# Patient Record
Sex: Female | Born: 1945 | Race: White | Hispanic: No | Marital: Married | Smoking: Former smoker
Health system: Southern US, Community
[De-identification: ages and names within clinical notes are randomized; demographics above are authoritative.]

## PROBLEM LIST (undated history)

## (undated) DIAGNOSIS — I499 Cardiac arrhythmia, unspecified: Secondary | ICD-10-CM

## (undated) DIAGNOSIS — N184 Chronic kidney disease, stage 4 (severe): Secondary | ICD-10-CM

## (undated) DIAGNOSIS — Z803 Family history of malignant neoplasm of breast: Secondary | ICD-10-CM

## (undated) DIAGNOSIS — I10 Essential (primary) hypertension: Secondary | ICD-10-CM

## (undated) DIAGNOSIS — N179 Acute kidney failure, unspecified: Secondary | ICD-10-CM

## (undated) DIAGNOSIS — Z87442 Personal history of urinary calculi: Secondary | ICD-10-CM

## (undated) DIAGNOSIS — I480 Paroxysmal atrial fibrillation: Secondary | ICD-10-CM

## (undated) DIAGNOSIS — R0600 Dyspnea, unspecified: Secondary | ICD-10-CM

## (undated) DIAGNOSIS — C801 Malignant (primary) neoplasm, unspecified: Secondary | ICD-10-CM

## (undated) DIAGNOSIS — Z800 Family history of malignant neoplasm of digestive organs: Secondary | ICD-10-CM

## (undated) DIAGNOSIS — Z85828 Personal history of other malignant neoplasm of skin: Secondary | ICD-10-CM

## (undated) DIAGNOSIS — Z860100 Personal history of colon polyps, unspecified: Secondary | ICD-10-CM

## (undated) DIAGNOSIS — C50919 Malignant neoplasm of unspecified site of unspecified female breast: Secondary | ICD-10-CM

## (undated) DIAGNOSIS — I5033 Acute on chronic diastolic (congestive) heart failure: Secondary | ICD-10-CM

## (undated) DIAGNOSIS — Z808 Family history of malignant neoplasm of other organs or systems: Secondary | ICD-10-CM

## (undated) DIAGNOSIS — I5032 Chronic diastolic (congestive) heart failure: Secondary | ICD-10-CM

## (undated) DIAGNOSIS — M1990 Unspecified osteoarthritis, unspecified site: Secondary | ICD-10-CM

## (undated) DIAGNOSIS — R778 Other specified abnormalities of plasma proteins: Secondary | ICD-10-CM

## (undated) HISTORY — DX: Essential (primary) hypertension: I10

## (undated) HISTORY — DX: Personal history of colonic polyps: Z86.010

## (undated) HISTORY — DX: Chronic diastolic (congestive) heart failure: I50.32

## (undated) HISTORY — DX: Other specified abnormalities of plasma proteins: R77.8

## (undated) HISTORY — PX: COLONOSCOPY: SHX174

## (undated) HISTORY — DX: Family history of malignant neoplasm of digestive organs: Z80.0

## (undated) HISTORY — PX: FOOT SURGERY: SHX648

## (undated) HISTORY — DX: Personal history of colon polyps, unspecified: Z86.0100

## (undated) HISTORY — DX: Family history of malignant neoplasm of breast: Z80.3

## (undated) HISTORY — DX: Malignant neoplasm of unspecified site of unspecified female breast: C50.919

## (undated) HISTORY — DX: Paroxysmal atrial fibrillation: I48.0

## (undated) HISTORY — DX: Family history of malignant neoplasm of other organs or systems: Z80.8

## (undated) HISTORY — DX: Acute on chronic diastolic (congestive) heart failure: I50.33

## (undated) HISTORY — DX: Personal history of other malignant neoplasm of skin: Z85.828

## (undated) HISTORY — DX: Acute kidney failure, unspecified: N17.9

---

## 1998-08-30 HISTORY — PX: OTHER SURGICAL HISTORY: SHX169

## 1998-10-10 ENCOUNTER — Ambulatory Visit (HOSPITAL_BASED_OUTPATIENT_CLINIC_OR_DEPARTMENT_OTHER): Admission: RE | Admit: 1998-10-10 | Discharge: 1998-10-10 | Payer: Self-pay | Admitting: *Deleted

## 1998-10-21 ENCOUNTER — Ambulatory Visit (HOSPITAL_BASED_OUTPATIENT_CLINIC_OR_DEPARTMENT_OTHER): Admission: RE | Admit: 1998-10-21 | Discharge: 1998-10-21 | Payer: Self-pay | Admitting: *Deleted

## 2014-09-27 DIAGNOSIS — J3489 Other specified disorders of nose and nasal sinuses: Secondary | ICD-10-CM | POA: Diagnosis not present

## 2014-10-03 DIAGNOSIS — J209 Acute bronchitis, unspecified: Secondary | ICD-10-CM | POA: Diagnosis not present

## 2014-11-05 DIAGNOSIS — J441 Chronic obstructive pulmonary disease with (acute) exacerbation: Secondary | ICD-10-CM | POA: Diagnosis not present

## 2014-11-05 DIAGNOSIS — I10 Essential (primary) hypertension: Secondary | ICD-10-CM | POA: Diagnosis not present

## 2014-11-05 DIAGNOSIS — Z1389 Encounter for screening for other disorder: Secondary | ICD-10-CM | POA: Diagnosis not present

## 2014-11-05 DIAGNOSIS — E780 Pure hypercholesterolemia: Secondary | ICD-10-CM | POA: Diagnosis not present

## 2014-11-05 DIAGNOSIS — Z79899 Other long term (current) drug therapy: Secondary | ICD-10-CM | POA: Diagnosis not present

## 2014-11-22 DIAGNOSIS — M62838 Other muscle spasm: Secondary | ICD-10-CM | POA: Diagnosis not present

## 2014-12-26 DIAGNOSIS — H3532 Exudative age-related macular degeneration: Secondary | ICD-10-CM | POA: Diagnosis not present

## 2014-12-26 DIAGNOSIS — H2513 Age-related nuclear cataract, bilateral: Secondary | ICD-10-CM | POA: Diagnosis not present

## 2015-01-01 DIAGNOSIS — D126 Benign neoplasm of colon, unspecified: Secondary | ICD-10-CM | POA: Diagnosis not present

## 2015-01-23 ENCOUNTER — Other Ambulatory Visit: Payer: Self-pay

## 2015-01-23 DIAGNOSIS — Z800 Family history of malignant neoplasm of digestive organs: Secondary | ICD-10-CM | POA: Diagnosis not present

## 2015-01-23 DIAGNOSIS — Z87891 Personal history of nicotine dependence: Secondary | ICD-10-CM | POA: Diagnosis not present

## 2015-01-23 DIAGNOSIS — K5730 Diverticulosis of large intestine without perforation or abscess without bleeding: Secondary | ICD-10-CM | POA: Diagnosis not present

## 2015-01-23 DIAGNOSIS — D124 Benign neoplasm of descending colon: Secondary | ICD-10-CM | POA: Diagnosis not present

## 2015-01-23 DIAGNOSIS — K648 Other hemorrhoids: Secondary | ICD-10-CM | POA: Diagnosis not present

## 2015-01-23 DIAGNOSIS — I10 Essential (primary) hypertension: Secondary | ICD-10-CM | POA: Diagnosis not present

## 2015-01-23 DIAGNOSIS — D120 Benign neoplasm of cecum: Secondary | ICD-10-CM | POA: Diagnosis not present

## 2015-01-23 DIAGNOSIS — Z86010 Personal history of colonic polyps: Secondary | ICD-10-CM | POA: Diagnosis not present

## 2015-01-23 DIAGNOSIS — D122 Benign neoplasm of ascending colon: Secondary | ICD-10-CM | POA: Diagnosis not present

## 2015-01-23 DIAGNOSIS — D126 Benign neoplasm of colon, unspecified: Secondary | ICD-10-CM | POA: Diagnosis not present

## 2015-01-23 DIAGNOSIS — Z1211 Encounter for screening for malignant neoplasm of colon: Secondary | ICD-10-CM | POA: Diagnosis not present

## 2015-01-23 DIAGNOSIS — K621 Rectal polyp: Secondary | ICD-10-CM | POA: Diagnosis not present

## 2015-03-13 DIAGNOSIS — L300 Nummular dermatitis: Secondary | ICD-10-CM | POA: Diagnosis not present

## 2015-03-13 DIAGNOSIS — C44619 Basal cell carcinoma of skin of left upper limb, including shoulder: Secondary | ICD-10-CM | POA: Diagnosis not present

## 2015-03-13 DIAGNOSIS — C44519 Basal cell carcinoma of skin of other part of trunk: Secondary | ICD-10-CM | POA: Diagnosis not present

## 2015-03-28 DIAGNOSIS — Z79899 Other long term (current) drug therapy: Secondary | ICD-10-CM | POA: Diagnosis not present

## 2015-03-28 DIAGNOSIS — R739 Hyperglycemia, unspecified: Secondary | ICD-10-CM | POA: Diagnosis not present

## 2015-03-28 DIAGNOSIS — E780 Pure hypercholesterolemia: Secondary | ICD-10-CM | POA: Diagnosis not present

## 2015-03-28 DIAGNOSIS — Z23 Encounter for immunization: Secondary | ICD-10-CM | POA: Diagnosis not present

## 2015-03-28 DIAGNOSIS — I10 Essential (primary) hypertension: Secondary | ICD-10-CM | POA: Diagnosis not present

## 2015-04-01 DIAGNOSIS — C44519 Basal cell carcinoma of skin of other part of trunk: Secondary | ICD-10-CM | POA: Diagnosis not present

## 2015-04-24 DIAGNOSIS — D0462 Carcinoma in situ of skin of left upper limb, including shoulder: Secondary | ICD-10-CM | POA: Diagnosis not present

## 2015-05-09 DIAGNOSIS — M25473 Effusion, unspecified ankle: Secondary | ICD-10-CM | POA: Diagnosis not present

## 2015-05-09 DIAGNOSIS — Z1389 Encounter for screening for other disorder: Secondary | ICD-10-CM | POA: Diagnosis not present

## 2015-05-09 DIAGNOSIS — I10 Essential (primary) hypertension: Secondary | ICD-10-CM | POA: Diagnosis not present

## 2015-05-09 DIAGNOSIS — Z23 Encounter for immunization: Secondary | ICD-10-CM | POA: Diagnosis not present

## 2015-06-20 DIAGNOSIS — B301 Conjunctivitis due to adenovirus: Secondary | ICD-10-CM | POA: Diagnosis not present

## 2015-06-26 DIAGNOSIS — B301 Conjunctivitis due to adenovirus: Secondary | ICD-10-CM | POA: Diagnosis not present

## 2015-06-30 DIAGNOSIS — M7020 Olecranon bursitis, unspecified elbow: Secondary | ICD-10-CM | POA: Diagnosis not present

## 2015-07-01 DIAGNOSIS — Z1231 Encounter for screening mammogram for malignant neoplasm of breast: Secondary | ICD-10-CM | POA: Diagnosis not present

## 2015-07-04 DIAGNOSIS — L03119 Cellulitis of unspecified part of limb: Secondary | ICD-10-CM | POA: Diagnosis not present

## 2015-07-17 DIAGNOSIS — M25522 Pain in left elbow: Secondary | ICD-10-CM | POA: Diagnosis not present

## 2015-07-17 DIAGNOSIS — Z0000 Encounter for general adult medical examination without abnormal findings: Secondary | ICD-10-CM | POA: Diagnosis not present

## 2015-07-17 DIAGNOSIS — M25422 Effusion, left elbow: Secondary | ICD-10-CM | POA: Diagnosis not present

## 2015-07-21 DIAGNOSIS — M25571 Pain in right ankle and joints of right foot: Secondary | ICD-10-CM | POA: Diagnosis not present

## 2015-07-31 DIAGNOSIS — S82831A Other fracture of upper and lower end of right fibula, initial encounter for closed fracture: Secondary | ICD-10-CM | POA: Diagnosis not present

## 2015-08-30 DIAGNOSIS — R05 Cough: Secondary | ICD-10-CM | POA: Diagnosis not present

## 2015-08-30 DIAGNOSIS — J019 Acute sinusitis, unspecified: Secondary | ICD-10-CM | POA: Diagnosis not present

## 2015-09-04 DIAGNOSIS — S82831A Other fracture of upper and lower end of right fibula, initial encounter for closed fracture: Secondary | ICD-10-CM | POA: Diagnosis not present

## 2015-10-02 DIAGNOSIS — E785 Hyperlipidemia, unspecified: Secondary | ICD-10-CM | POA: Diagnosis not present

## 2015-10-02 DIAGNOSIS — Z79899 Other long term (current) drug therapy: Secondary | ICD-10-CM | POA: Diagnosis not present

## 2015-10-02 DIAGNOSIS — Z1389 Encounter for screening for other disorder: Secondary | ICD-10-CM | POA: Diagnosis not present

## 2015-10-02 DIAGNOSIS — I10 Essential (primary) hypertension: Secondary | ICD-10-CM | POA: Diagnosis not present

## 2015-10-02 DIAGNOSIS — E8881 Metabolic syndrome: Secondary | ICD-10-CM | POA: Diagnosis not present

## 2015-10-02 DIAGNOSIS — R739 Hyperglycemia, unspecified: Secondary | ICD-10-CM | POA: Diagnosis not present

## 2015-10-09 DIAGNOSIS — H5203 Hypermetropia, bilateral: Secondary | ICD-10-CM | POA: Diagnosis not present

## 2015-10-09 DIAGNOSIS — H353112 Nonexudative age-related macular degeneration, right eye, intermediate dry stage: Secondary | ICD-10-CM | POA: Diagnosis not present

## 2015-12-24 DIAGNOSIS — R002 Palpitations: Secondary | ICD-10-CM | POA: Diagnosis not present

## 2015-12-29 DIAGNOSIS — R079 Chest pain, unspecified: Secondary | ICD-10-CM | POA: Diagnosis not present

## 2015-12-29 DIAGNOSIS — I517 Cardiomegaly: Secondary | ICD-10-CM | POA: Diagnosis not present

## 2015-12-29 DIAGNOSIS — R002 Palpitations: Secondary | ICD-10-CM | POA: Diagnosis not present

## 2015-12-29 DIAGNOSIS — J449 Chronic obstructive pulmonary disease, unspecified: Secondary | ICD-10-CM | POA: Diagnosis not present

## 2016-02-01 DIAGNOSIS — R002 Palpitations: Secondary | ICD-10-CM | POA: Insufficient documentation

## 2016-02-01 HISTORY — DX: Palpitations: R00.2

## 2016-02-03 DIAGNOSIS — R002 Palpitations: Secondary | ICD-10-CM | POA: Diagnosis not present

## 2016-02-03 DIAGNOSIS — I10 Essential (primary) hypertension: Secondary | ICD-10-CM | POA: Diagnosis not present

## 2016-03-23 DIAGNOSIS — C44612 Basal cell carcinoma of skin of right upper limb, including shoulder: Secondary | ICD-10-CM | POA: Diagnosis not present

## 2016-04-12 DIAGNOSIS — H353123 Nonexudative age-related macular degeneration, left eye, advanced atrophic without subfoveal involvement: Secondary | ICD-10-CM | POA: Diagnosis not present

## 2016-04-13 DIAGNOSIS — C44612 Basal cell carcinoma of skin of right upper limb, including shoulder: Secondary | ICD-10-CM | POA: Diagnosis not present

## 2016-04-19 DIAGNOSIS — Z79899 Other long term (current) drug therapy: Secondary | ICD-10-CM | POA: Diagnosis not present

## 2016-04-19 DIAGNOSIS — E785 Hyperlipidemia, unspecified: Secondary | ICD-10-CM | POA: Diagnosis not present

## 2016-04-19 DIAGNOSIS — Z0000 Encounter for general adult medical examination without abnormal findings: Secondary | ICD-10-CM | POA: Diagnosis not present

## 2016-04-19 DIAGNOSIS — M25532 Pain in left wrist: Secondary | ICD-10-CM | POA: Diagnosis not present

## 2016-04-19 DIAGNOSIS — I10 Essential (primary) hypertension: Secondary | ICD-10-CM | POA: Diagnosis not present

## 2016-09-01 DIAGNOSIS — Z1231 Encounter for screening mammogram for malignant neoplasm of breast: Secondary | ICD-10-CM | POA: Diagnosis not present

## 2016-09-13 DIAGNOSIS — R921 Mammographic calcification found on diagnostic imaging of breast: Secondary | ICD-10-CM | POA: Diagnosis not present

## 2016-09-13 DIAGNOSIS — R928 Other abnormal and inconclusive findings on diagnostic imaging of breast: Secondary | ICD-10-CM | POA: Diagnosis not present

## 2016-09-21 DIAGNOSIS — D0462 Carcinoma in situ of skin of left upper limb, including shoulder: Secondary | ICD-10-CM | POA: Diagnosis not present

## 2016-10-04 DIAGNOSIS — I10 Essential (primary) hypertension: Secondary | ICD-10-CM | POA: Diagnosis not present

## 2016-10-04 DIAGNOSIS — R002 Palpitations: Secondary | ICD-10-CM | POA: Diagnosis not present

## 2016-10-05 DIAGNOSIS — D0462 Carcinoma in situ of skin of left upper limb, including shoulder: Secondary | ICD-10-CM | POA: Diagnosis not present

## 2016-10-11 DIAGNOSIS — R002 Palpitations: Secondary | ICD-10-CM | POA: Diagnosis not present

## 2016-10-13 DIAGNOSIS — R002 Palpitations: Secondary | ICD-10-CM | POA: Diagnosis not present

## 2016-10-30 DIAGNOSIS — I471 Supraventricular tachycardia: Secondary | ICD-10-CM | POA: Insufficient documentation

## 2016-10-30 DIAGNOSIS — I4719 Other supraventricular tachycardia: Secondary | ICD-10-CM | POA: Insufficient documentation

## 2016-10-30 HISTORY — DX: Other supraventricular tachycardia: I47.19

## 2016-10-30 HISTORY — DX: Supraventricular tachycardia: I47.1

## 2016-11-01 DIAGNOSIS — I10 Essential (primary) hypertension: Secondary | ICD-10-CM | POA: Diagnosis not present

## 2016-11-01 DIAGNOSIS — I471 Supraventricular tachycardia: Secondary | ICD-10-CM | POA: Diagnosis not present

## 2016-11-05 DIAGNOSIS — Z79899 Other long term (current) drug therapy: Secondary | ICD-10-CM | POA: Diagnosis not present

## 2016-11-05 DIAGNOSIS — E7800 Pure hypercholesterolemia, unspecified: Secondary | ICD-10-CM | POA: Diagnosis not present

## 2016-11-05 DIAGNOSIS — E785 Hyperlipidemia, unspecified: Secondary | ICD-10-CM | POA: Diagnosis not present

## 2016-11-05 DIAGNOSIS — Z1389 Encounter for screening for other disorder: Secondary | ICD-10-CM | POA: Diagnosis not present

## 2016-11-05 DIAGNOSIS — Z0000 Encounter for general adult medical examination without abnormal findings: Secondary | ICD-10-CM | POA: Diagnosis not present

## 2016-11-05 DIAGNOSIS — I10 Essential (primary) hypertension: Secondary | ICD-10-CM | POA: Diagnosis not present

## 2016-11-10 DIAGNOSIS — J0100 Acute maxillary sinusitis, unspecified: Secondary | ICD-10-CM | POA: Diagnosis not present

## 2016-11-16 DIAGNOSIS — J0100 Acute maxillary sinusitis, unspecified: Secondary | ICD-10-CM | POA: Diagnosis not present

## 2016-11-16 DIAGNOSIS — J209 Acute bronchitis, unspecified: Secondary | ICD-10-CM | POA: Diagnosis not present

## 2016-12-07 DIAGNOSIS — D0462 Carcinoma in situ of skin of left upper limb, including shoulder: Secondary | ICD-10-CM | POA: Diagnosis not present

## 2016-12-07 DIAGNOSIS — C44612 Basal cell carcinoma of skin of right upper limb, including shoulder: Secondary | ICD-10-CM | POA: Diagnosis not present

## 2017-01-18 DIAGNOSIS — H2513 Age-related nuclear cataract, bilateral: Secondary | ICD-10-CM | POA: Diagnosis not present

## 2017-01-18 DIAGNOSIS — H353123 Nonexudative age-related macular degeneration, left eye, advanced atrophic without subfoveal involvement: Secondary | ICD-10-CM | POA: Diagnosis not present

## 2017-01-18 DIAGNOSIS — H524 Presbyopia: Secondary | ICD-10-CM | POA: Diagnosis not present

## 2017-03-14 DIAGNOSIS — R921 Mammographic calcification found on diagnostic imaging of breast: Secondary | ICD-10-CM | POA: Diagnosis not present

## 2017-03-23 DIAGNOSIS — L821 Other seborrheic keratosis: Secondary | ICD-10-CM | POA: Diagnosis not present

## 2017-03-23 DIAGNOSIS — L578 Other skin changes due to chronic exposure to nonionizing radiation: Secondary | ICD-10-CM | POA: Diagnosis not present

## 2017-03-23 DIAGNOSIS — L820 Inflamed seborrheic keratosis: Secondary | ICD-10-CM | POA: Diagnosis not present

## 2017-03-23 DIAGNOSIS — C44529 Squamous cell carcinoma of skin of other part of trunk: Secondary | ICD-10-CM | POA: Diagnosis not present

## 2017-04-04 DIAGNOSIS — C44519 Basal cell carcinoma of skin of other part of trunk: Secondary | ICD-10-CM | POA: Diagnosis not present

## 2017-05-03 DIAGNOSIS — I10 Essential (primary) hypertension: Secondary | ICD-10-CM | POA: Diagnosis not present

## 2017-05-03 DIAGNOSIS — E785 Hyperlipidemia, unspecified: Secondary | ICD-10-CM | POA: Diagnosis not present

## 2017-05-03 DIAGNOSIS — Z79899 Other long term (current) drug therapy: Secondary | ICD-10-CM | POA: Diagnosis not present

## 2017-05-11 DIAGNOSIS — Z23 Encounter for immunization: Secondary | ICD-10-CM | POA: Diagnosis not present

## 2017-05-19 DIAGNOSIS — N644 Mastodynia: Secondary | ICD-10-CM | POA: Diagnosis not present

## 2017-05-19 DIAGNOSIS — I10 Essential (primary) hypertension: Secondary | ICD-10-CM | POA: Diagnosis not present

## 2017-06-08 DIAGNOSIS — R922 Inconclusive mammogram: Secondary | ICD-10-CM | POA: Diagnosis not present

## 2017-06-08 DIAGNOSIS — N6322 Unspecified lump in the left breast, upper inner quadrant: Secondary | ICD-10-CM | POA: Diagnosis not present

## 2017-06-10 DIAGNOSIS — R590 Localized enlarged lymph nodes: Secondary | ICD-10-CM | POA: Diagnosis not present

## 2017-06-10 DIAGNOSIS — R928 Other abnormal and inconclusive findings on diagnostic imaging of breast: Secondary | ICD-10-CM | POA: Diagnosis not present

## 2017-06-10 DIAGNOSIS — C773 Secondary and unspecified malignant neoplasm of axilla and upper limb lymph nodes: Secondary | ICD-10-CM | POA: Diagnosis not present

## 2017-06-10 DIAGNOSIS — C50212 Malignant neoplasm of upper-inner quadrant of left female breast: Secondary | ICD-10-CM | POA: Diagnosis not present

## 2017-06-10 DIAGNOSIS — N6322 Unspecified lump in the left breast, upper inner quadrant: Secondary | ICD-10-CM | POA: Diagnosis not present

## 2017-06-17 DIAGNOSIS — C773 Secondary and unspecified malignant neoplasm of axilla and upper limb lymph nodes: Secondary | ICD-10-CM | POA: Diagnosis not present

## 2017-06-17 DIAGNOSIS — C50912 Malignant neoplasm of unspecified site of left female breast: Secondary | ICD-10-CM | POA: Diagnosis not present

## 2017-06-21 ENCOUNTER — Ambulatory Visit (HOSPITAL_BASED_OUTPATIENT_CLINIC_OR_DEPARTMENT_OTHER): Payer: Medicare Other | Admitting: Hematology

## 2017-06-21 ENCOUNTER — Encounter: Payer: Self-pay | Admitting: Hematology

## 2017-06-21 ENCOUNTER — Telehealth: Payer: Self-pay | Admitting: Hematology

## 2017-06-21 ENCOUNTER — Ambulatory Visit (HOSPITAL_BASED_OUTPATIENT_CLINIC_OR_DEPARTMENT_OTHER): Payer: Medicare Other

## 2017-06-21 DIAGNOSIS — I10 Essential (primary) hypertension: Secondary | ICD-10-CM | POA: Diagnosis not present

## 2017-06-21 DIAGNOSIS — I4891 Unspecified atrial fibrillation: Secondary | ICD-10-CM | POA: Diagnosis not present

## 2017-06-21 DIAGNOSIS — C773 Secondary and unspecified malignant neoplasm of axilla and upper limb lymph nodes: Secondary | ICD-10-CM

## 2017-06-21 DIAGNOSIS — C50212 Malignant neoplasm of upper-inner quadrant of left female breast: Secondary | ICD-10-CM

## 2017-06-21 DIAGNOSIS — Z171 Estrogen receptor negative status [ER-]: Principal | ICD-10-CM

## 2017-06-21 DIAGNOSIS — Z803 Family history of malignant neoplasm of breast: Secondary | ICD-10-CM

## 2017-06-21 HISTORY — DX: Malignant neoplasm of upper-inner quadrant of left female breast: C50.212

## 2017-06-21 HISTORY — DX: Malignant neoplasm of upper-inner quadrant of left female breast: Z17.1

## 2017-06-21 LAB — COMPREHENSIVE METABOLIC PANEL
ALT: 21 U/L (ref 0–55)
AST: 23 U/L (ref 5–34)
Albumin: 4.2 g/dL (ref 3.5–5.0)
Alkaline Phosphatase: 51 U/L (ref 40–150)
Anion Gap: 11 mEq/L (ref 3–11)
BILIRUBIN TOTAL: 0.56 mg/dL (ref 0.20–1.20)
BUN: 14.4 mg/dL (ref 7.0–26.0)
CHLORIDE: 105 meq/L (ref 98–109)
CO2: 27 meq/L (ref 22–29)
Calcium: 9.6 mg/dL (ref 8.4–10.4)
Creatinine: 0.9 mg/dL (ref 0.6–1.1)
GLUCOSE: 109 mg/dL (ref 70–140)
Potassium: 3.9 mEq/L (ref 3.5–5.1)
SODIUM: 143 meq/L (ref 136–145)
TOTAL PROTEIN: 7.2 g/dL (ref 6.4–8.3)

## 2017-06-21 LAB — CBC WITH DIFFERENTIAL/PLATELET
BASO%: 0.9 % (ref 0.0–2.0)
Basophils Absolute: 0.1 10*3/uL (ref 0.0–0.1)
EOS%: 1.5 % (ref 0.0–7.0)
Eosinophils Absolute: 0.1 10*3/uL (ref 0.0–0.5)
HCT: 43 % (ref 34.8–46.6)
HGB: 14.1 g/dL (ref 11.6–15.9)
LYMPH%: 28.7 % (ref 14.0–49.7)
MCH: 28.6 pg (ref 25.1–34.0)
MCHC: 32.7 g/dL (ref 31.5–36.0)
MCV: 87.4 fL (ref 79.5–101.0)
MONO#: 1.1 10*3/uL — ABNORMAL HIGH (ref 0.1–0.9)
MONO%: 11.6 % (ref 0.0–14.0)
NEUT%: 57.3 % (ref 38.4–76.8)
NEUTROS ABS: 5.3 10*3/uL (ref 1.5–6.5)
Platelets: 217 10*3/uL (ref 145–400)
RBC: 4.93 10*6/uL (ref 3.70–5.45)
RDW: 14.2 % (ref 11.2–14.5)
WBC: 9.2 10*3/uL (ref 3.9–10.3)
lymph#: 2.6 10*3/uL (ref 0.9–3.3)

## 2017-06-21 MED ORDER — LORAZEPAM 1 MG PO TABS: 1.0000 mg | ORAL_TABLET | Freq: Once | ORAL | 0 refills | Status: DC | PRN

## 2017-06-21 NOTE — Telephone Encounter (Signed)
Scheduled appt per 10/23 los - Gave patient AVS and calender per los. - Central radiology to contact patient with ct and MRI schedule.

## 2017-06-21 NOTE — Progress Notes (Signed)
[facility]  [phone] Fax:([phone]) [place] Note   Patient Care Team: Ronita Hipps, MD as PCP - General (Family Medicine) 06/21/2017  CHIEF COMPLAINTS/PURPOSE OF CONSULTATION:  Newly diagnosed left breast cancer, triple negative, stage IIIB  ONCOLOGY HISTORY: Oncology History   Cancer Staging Malignant neoplasm of upper-inner quadrant of left breast in female, estrogen receptor negative ([place]) Staging form: Breast, AJCC 8th Edition - Clinical stage from 06/10/2017: Stage IIIB (cT2, cN1, cM0, G3, ER: Negative, PR: Negative, HER2: Negative) - Signed by Truitt Merle, MD on 06/21/2017       Malignant neoplasm of upper-inner quadrant of left breast in female, estrogen receptor negative ([place])   06/08/2017 Imaging    DIAGNOSTIC MAMMO and [place] Left Breast 06/08/17 IMPRESSION:  Highly suspicious palpable left breast 11 o'clock mass which measures 2.8 cm in greatest dimension. Ductal extension anteriorly to the level of the nipple, and posteriorly for additional 1.8cm, is suspicious for tumor extension.       06/10/2017 Initial Diagnosis    Malignant neoplasm of upper-inner quadrant of left breast in female, estrogen receptor negative ([place])      06/10/2017 Pathology Results    Diagnosis 06/10/17 1.) Breast, left, needle core biopsy -INVASIVE DUCTAL CARCINOMA, GRADE 3, WITH FOCAL HIGH GRADE DUCTAL CARCINOMA IN SITU -NEOPLASM INVOLVES ALL CORES AND MEASURES APPROXIMATELY 1.5CM IN MAXIMAL LINEAR DIMENSION  2.) Lymph node, needle/core biopsy, left axilla -METASTATIC CARCINOMA CONSISTENT WITH BREAST PRIMARY       06/10/2017 Receptors her2    ER: -, PR-, HER2 not amplified       HISTORY OF PRESENTING ILLNESS:  Lindsay [name] 71 y.o. female is here because of left breast cancer.   Initially, the patient palpated a lump within the retroareolar region of the left breast at the end of July. She also had noticed some left nipple inversion,  soreness, and hardness around the nipple for several months prior. In the past three months she reports that her soreness had improved originally, but since then she has not noticed any changes or worsening. She denies any abdominal pain, back pain, cough, chest pain, loss of appetite, weight loss, or any other associated symptoms in this time period.   She subsequently underwent mammogram and US of the left breast including the axilla which was notable for a suspicious mass within the 11 o'clock region of the left breast measuring at 2.8cm in the greatest dimension. Following this, she underwent stereotactic needle-core biopsy showing her to have invasive ductal carcinoma, histologic analysis demonstrating triple negative breast cancer, Ki67 20%. Additionally, an enlarged lymph node was surveyed within the left axilla which was positive for metastatic carcinoma.   On 06/17/17, she met with Dr [name] of [facility]. Given her triple negative cancer and the invasive nature of her disease, he recommended mastectomy with SLN dissection with port placement. She was subsequently referred into medical oncology for ongoing management of her disease to consider beginning surgery vs chemotherapy as the first round of treatment.   She reports today with her husband and sister. Overall she has been doing well and without notable acute complaints. Her breast soreness has resolved since initial onset. She has no other symptoms of concern to her. Of note, both of her sisters have also been diagnosed with breast cancer and she has an extensive family history of other cancers, including her mother who had pancreatic cancer.   GYN HISTORY  Menarchal: 71 years of age LMP: 50  years ago Contraceptive: Yes in her youth HRT: none GP: G2P2, she was 71yo at the age of first delivery. She did not breast feed.   She did previously smoke for approximately 50 years. She quit three years ago on August 4th, 2015. On  average, she smoked 2ppd. She does not drink any alcoholic beverages and does not partake in illicit drugs.   MEDICAL HISTORY:  Past Medical History:  Diagnosis Date  . Atrial fibrillation ([place]) 2017  . Hypertension     SURGICAL HISTORY: Past Surgical History:  Procedure Laterality Date  . FOOT SURGERY Right   . left breast lumpectomy  2000    SOCIAL HISTORY: Social History   Social History  . Marital status: Married    Spouse name: N/A  . Number of children: N/A  . Years of education: N/A   Occupational History  . Not on file.   Social History Main Topics  . Smoking status: Former Smoker    Packs/day: 2.00    Years: 50.00    Quit date: 06/02/2014  . Smokeless tobacco: Never Used  . Alcohol use No  . Drug use: No  . Sexual activity: Yes   Other Topics Concern  . Not on file   Social History Narrative  . No narrative on file    FAMILY HISTORY: Family History  Problem Relation Age of Onset  . Cancer Mother 40       pancreatic cancer   . Cancer Sister 79       breast cancer   . Cancer Paternal Aunt        breast cancer   . Cancer Sister 66       breast cancer   . Cancer Sister        malenoma   . Cancer Sister 20       colon cancer     ALLERGIES:  has No Known Allergies.  MEDICATIONS:  Current Outpatient Prescriptions  Medication Sig Dispense Refill  . acebutolol (SECTRAL) 200 MG capsule Take 200 mg by mouth 2 (two) times daily.    Marland Kitchen atorvastatin (LIPITOR) 10 MG tablet Take 10 mg by mouth at bedtime.    . calcium carbonate (OS-CAL) 600 MG TABS tablet Take 1,200 mg by mouth daily.    . Cholecalciferol (VITAMIN D3) 2000 units TABS Take 1 tablet by mouth daily.    . cloNIDine (CATAPRES) 0.1 MG tablet Take 0.1 mg by mouth 2 (two) times daily.    . DOCOSAHEXAENOIC ACID PO Take 1,200 mg by mouth 2 (two) times daily.    Marland Kitchen escitalopram (LEXAPRO) 5 MG tablet Take 5 mg by mouth daily.    . hydrALAZINE (APRESOLINE) 25 MG tablet Take 12.5 mg by mouth 3  (three) times daily.    Marland Kitchen LORazepam (ATIVAN) 1 MG tablet Take 1 tablet (1 mg total) by mouth once as needed for anxiety. Take 1 tab 1 hour before MRI scan 2 tablet 0  . magnesium oxide (MAG-OX) 400 MG tablet Take 400 mg by mouth daily.    Marland Kitchen olmesartan-hydrochlorothiazide (BENICAR HCT) 40-25 MG tablet Take 1 tablet by mouth daily.  1  . vitamin B-12 (CYANOCOBALAMIN) 1000 MCG tablet Take 1 tablet by mouth daily.     No current facility-administered medications for this visit.     REVIEW OF SYSTEMS:   Constitutional: Denies fevers, chills or abnormal night sweats Eyes: Denies blurriness of vision, double vision or watery eyes Ears, nose, mouth, throat, and face: Denies mucositis or sore  throat Respiratory: Denies cough, dyspnea or wheezes Cardiovascular: Denies palpitation, chest discomfort or lower extremity swelling Gastrointestinal:  Denies nausea, heartburn or change in bowel habits Skin: Denies abnormal skin rashes Lymphatics: Denies new lymphadenopathy or easy bruising Neurological:Denies numbness, tingling or new weaknesses Behavioral/Psych: Mood is stable, no new changes  All other systems were reviewed with the patient and are negative.  PHYSICAL EXAMINATION: ECOG PERFORMANCE STATUS: 0 - Asymptomatic  Vitals:   06/21/17 1456  BP: (!) 193/62  Pulse: 70  Resp: 20  Temp: 98.6 F (37 C)  SpO2: 96%   Filed Weights   06/21/17 1456  Weight: 271 lb 12.8 oz (123.3 kg)    GENERAL:alert, no distress and comfortable SKIN: skin color, texture, turgor are normal, no rashes or significant lesions EYES: normal, conjunctiva are pink and non-injected, sclera clear OROPHARYNX:no exudate, no erythema and lips, buccal mucosa, and tongue normal  NECK: supple, thyroid normal size, non-tender, without nodularity LYMPH:  no palpable lymphadenopathy in the cervical, axillary or inguinal LUNGS: clear to auscultation and percussion with normal breathing effort HEART: regular rate & rhythm and  no murmurs and no lower extremity edema ABDOMEN:abdomen soft, non-tender and normal bowel sounds Musculoskeletal:no cyanosis of digits and no clubbing  PSYCH: alert & oriented x 3 with fluent speech NEURO: no focal motor/sensory deficits BREAST: She has some redness and skin edema in the areolar area of the left breast with bilateral nipple inversion. Additionally, there is a 3x1.5cm palpable mass within the 11 o'clock to 1 o'clock region of the left breast. Otherwise both axilla and right breast are without palpable mass, nodes, or notable changes.    LABORATORY DATA:  I have reviewed the data as listed CBC Latest Ref Rng & Units 06/21/2017  WBC 3.9 - 10.3 10e3/uL 9.2  Hemoglobin 11.6 - 15.9 g/dL 14.1  Hematocrit 34.8 - 46.6 % 43.0  Platelets 145 - 400 10e3/uL 217   PATHOLOGY:  Diagnosis 06/10/17 1.) Breast, left, needle core biopsy -INVASIVE DUCTAL CARCINOMA, GRADE 3, 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 No results found.   [place] Left Breast 06/08/17 IMPRESSION:  Highly suspicious palpable left breast 11 o'clock mass which measures 2.8 cm in greatest dimension. Ductal extension anteriorly to the level of the nipple, and posteriorly for additional 1.8cm, is suspicious for tumor extension.   ASSESSMENT: Lindsay [name] is a wonderful 71 y.o. female who presents today to discuss the ongoing management of her left breast cancer.   1. Malignant neoplasm of upper-inner quadrant of left breast of female, invasive ductal carcinoma, c2T0M0, Stage IIIb, ER/PR: negative, HER2: negative,  Grade 3.  -We discussed her mammogram, ultrasound findings, and initial biopsy results with patient and her family members in great detail.  -She presented with a palpable left breast mass, measures 2.8 cm on ultrasound, with left axillary lymph node involvement (not palpable on exam). Breast tumor biopsy showed triple negative breast ductal carcinoma. -She was seen by surgeon Dr. [name] who recommended left mastectomy with ALND and port placement.  -We discussed her risk of cancer recurrence after complete surgical resection. Due to the relatively large size of her primary  tumor, positive lymph nodes, and aggressive nature of her triple negative breast cancer, she is at very high risk of recurrence. -Due to her positive family history of breast cancer, we recommended her to see genetics, to ruled out inheritable breast cancer syndrome.   -We discussed the benefit of chemotherapy to reduce her risk of recurrence by 40-50% -We discussed neoadjuvant versus adjuvant chemotherapy. The benefit of neoadjuvant chemotherapy to evaluate her response, sooner to start systemic therapy, and downstage her disease to make lumpectomy a possibility, etc. I strongly recommend her to to consider neoadjuvant chemotherapy. I will discuss with her surgeon Dr. [name] also. She would like to proceed with this before surgery.  -I discussed the neoadjuvant chemotherapy regimens, especially with AC-T and TC.  Give stage III, triple negative disease, I suggest neoadjuvant Adriamycin and Cytoxan (AC) every 2 weeks for 4 cycles+Taxol every 2 weeks for 4 cycles, then surgery then radiation. Although she is 50, she is fit and active, would be a candidate for intensive chemotherapy. Additional carboplatine can be consdered if she had has BRCA gene mutation.  -The goal of therapy is curative.   --Chemotherapy consent: Side effects including but does not not limited to, fatigue, nausea, vomiting, diarrhea, hair loss, neuropathy, fluid  retention, renal and kidney dysfunction, neutropenic fever, needed for blood transfusion, bleeding, heart failure, small risk of leukemia or MDS, were discussed with patient in great detail. She agreed to proceed. -I recommend a CT scan and bone scan first for staging.    -She will need a port placed, chemo class and ECHO before starting chemotherapy. We'll tentatively start her chemotherapy in 2 weeks with chemo class next Friday.   2. AFIB and HTN -She is currently followed by a cardiologist for ongoing management of this. Not currently on formal anticoagulation therapy.  -She was on 36m ASA daily, but she reports that she recently stopped this in anticipation for her surgery. I encouraged her to start taking this again until her surgeon tell her to stop taking this.  -will obtain echo before her chemo   3. Genetics -I strongly encouraged her today to have genetic testing due to her strong family history of cancer. If this is positive, I informed her that her children and grandchildren would need to be tested as well. She would like to proceed with this.   PLAN:  -CT CAP w/ contrast, echo, breast MRI (with premed ativan), chemo class within one week -Lab today -genetics referral  -lab, flush, f/u and first cycle AC on 11/2  Orders Placed This Encounter  Procedures  . CT Abdomen Pelvis W Contrast    Standing Status:   Future    Standing Expiration Date:   06/21/2018    Order Specific Question:   If indicated for the ordered procedure, I authorize the administration of contrast media per Radiology protocol    Answer:   Yes    Order Specific Question:   [facility] imaging [facility]?    [facility]   Order Specific Question:   Radiology Contrast Protocol - do NOT remove file path    Answer:   \\[email or web address].pdf  . CT Chest W Contrast    Standing Status:   Future    Standing Expiration Date:   06/21/2018    Order Specific Question:   If  indicated for the ordered procedure, I authorize the administration of contrast media per Radiology protocol    Answer:   Yes    Order Specific  Question:   Preferred imaging location?    Answer:   [facility]    Order Specific Question:   Radiology Contrast Protocol - do NOT remove file path    Answer:   \\[email or web address].pdf  . NM Bone Scan Whole Body    Standing Status:   Future    Standing Expiration Date:   06/21/2018    Order Specific Question:   If indicated for the ordered procedure, I authorize the administration of a radiopharmaceutical per Radiology protocol    Answer:   Yes    Order Specific Question:   Preferred imaging location?    Answer:   [facility]    Order Specific Question:   Radiology Contrast Protocol - do NOT remove file path    Answer:   \\[email or web address]\[email or web address]  . MR BREAST BILATERAL W WO CONTRAST    Evaluation before neoadjuvant chemo    Standing Status:   Future    Standing Expiration Date:   08/21/2018    Order Specific Question:   If indicated for the ordered procedure, I authorize the administration of contrast media per Radiology protocol    Answer:   Yes    Order Specific Question:   What is the patient's sedation requirement?    Answer:   No Sedation    Order Specific Question:   Does the patient have a pacemaker or implanted devices?    Answer:   No    Order Specific Question:   Radiology Contrast Protocol - do NOT remove file path    Answer:   \\[email or web address]\Radiant\[email or web address]    Order Specific Question:   Preferred imaging location?    Answer:   [facility] (table limit-350 lbs)  . CBC with Differential    Standing Status:   Standing    Number of Occurrences:   50    Standing Expiration Date:   06/21/2022  . Comprehensive metabolic panel    Standing Status:   Standing    Number of Occurrences:   50    Standing Expiration Date:   06/21/2022  . CA 27.29    Standing  Status:   Standing    Number of Occurrences:   50    Standing Expiration Date:   06/21/2022  . Ambulatory referral to Genetics    Referral Priority:   Routine    Referral Type:   Consultation    Referral Reason:   Specialty Services Required    Number of Visits Requested:   1  . ECHOCARDIOGRAM COMPLETE    Standing Status:   Future    Standing Expiration Date:   09/21/2018    Order Specific Question:   Where should this test be performed    Answer:   [place]    Order Specific Question:   Perflutren DEFINITY (image enhancing agent) should be administered unless hypersensitivity or allergy exist    Answer:   Administer Perflutren    Order Specific Question:   Expected Date:    Answer:   1 week   This document serves as a record of services personally performed by Truitt Merle, MD. It was created on her behalf by Reola Mosher, a trained medical scribe. The creation of this record is based on the scribe's personal observations and the provider's statements to them. This document has been checked and approved by the attending provider.    Truitt Merle, MD 06/21/2017

## 2017-06-21 NOTE — Progress Notes (Signed)
START ON PATHWAY REGIMEN - Breast   Doxorubicin + Cyclophosphamide (AC):   A cycle is every 21 days:     Doxorubicin      Cyclophosphamide   **Always confirm dose/schedule in your pharmacy ordering system**    Paclitaxel 80 mg/m2 Weekly:   Administer weekly:     Paclitaxel   **Always confirm dose/schedule in your pharmacy ordering system**    Patient Characteristics: Preoperative or Nonsurgical Candidate (Clinical Staging), Neoadjuvant Therapy followed by Surgery, Invasive Disease, Chemotherapy, HER2 Negative/Unknown/Equivocal, ER Negative/Unknown, Platinum Therapy Not Indicated Therapeutic Status: Preoperative or Nonsurgical Candidate (Clinical Staging) AJCC M Category: cM0 AJCC Grade: G3 Breast Surgical Plan: Neoadjuvant Therapy followed by Surgery ER Status: Negative (-) AJCC 8 Stage Grouping: IIIB HER2 Status: Negative (-) AJCC T Category: cT2 AJCC N Category: cN1 PR Status: Negative (-) Type of Therapy: Platinum Therapy Not Indicated Intent of Therapy: Curative Intent, Discussed with Patient

## 2017-06-22 ENCOUNTER — Encounter: Payer: Self-pay | Admitting: *Deleted

## 2017-06-22 ENCOUNTER — Other Ambulatory Visit: Payer: Self-pay | Admitting: Surgery

## 2017-06-22 ENCOUNTER — Other Ambulatory Visit: Payer: Medicare Other

## 2017-06-22 LAB — CANCER ANTIGEN 27.29: CAN 27.29: 39.3 U/mL — AB (ref 0.0–38.6)

## 2017-06-22 MED ORDER — ONDANSETRON HCL 8 MG PO TABS: 8.0000 mg | ORAL_TABLET | Freq: Three times a day (TID) | ORAL | 2 refills | Status: DC | PRN

## 2017-06-22 MED ORDER — PROCHLORPERAZINE MALEATE 10 MG PO TABS: 10.0000 mg | ORAL_TABLET | Freq: Four times a day (QID) | ORAL | 2 refills | Status: DC | PRN

## 2017-06-22 MED ORDER — LIDOCAINE-PRILOCAINE 2.5-2.5 % EX CREA: 1.0000 "application " | TOPICAL_CREAM | CUTANEOUS | 2 refills | Status: DC | PRN

## 2017-06-22 NOTE — Addendum Note (Signed)
Addended by: Truitt Merle on: 06/22/2017 05:29 PM   Modules accepted: Orders

## 2017-06-23 ENCOUNTER — Encounter: Payer: Self-pay | Admitting: *Deleted

## 2017-06-23 ENCOUNTER — Ambulatory Visit (HOSPITAL_COMMUNITY)
Admission: RE | Admit: 2017-06-23 | Discharge: 2017-06-23 | Disposition: A | Discharge status: Home / Self Care | Payer: Medicare Other | Source: Ambulatory Visit | Attending: Hematology | Admitting: Hematology

## 2017-06-23 DIAGNOSIS — I420 Dilated cardiomyopathy: Secondary | ICD-10-CM | POA: Diagnosis not present

## 2017-06-23 DIAGNOSIS — Z171 Estrogen receptor negative status [ER-]: Secondary | ICD-10-CM | POA: Diagnosis not present

## 2017-06-23 DIAGNOSIS — C50212 Malignant neoplasm of upper-inner quadrant of left female breast: Secondary | ICD-10-CM | POA: Diagnosis not present

## 2017-06-23 NOTE — Pre-Procedure Instructions (Signed)
FRED HAMMES  06/23/2017      [id number] - [place], [place] - [address] [facility] [place] [address] Phone: [phone] Fax: [phone]    Your procedure is scheduled on November 1  Report to [place] at 0700 A.M.  Call this number if you have problems the morning of surgery:  [phone]   Remember:  Do not eat food or drink liquids after midnight.  Continue all other medications as directed by your physician except follow these medication instructions before surgery   Take these medicines the morning of surgery with A SIP OF WATER  acebutolol (SECTRAL) cloNIDine (CATAPRES) escitalopram (LEXAPRO) hydrALAZINE (APRESOLINE)  loratadine (CLARITIN)   7 days prior to surgery STOP taking any Aspirin (unless otherwise instructed by your surgeon), Aleve, Naproxen, Ibuprofen, Motrin, Advil, Goody's, BC's, all herbal medications, fish oil, and all vitamins   Do not wear jewelry, make-up or nail polish.  Do not wear lotions, powders, or perfumes, or deoderant.  Do not shave 48 hours prior to surgery.  Men may shave face and neck.  Do not bring valuables to the hospital.  [facility] is not responsible for any belongings or valuables.  Contacts, dentures or bridgework may not be worn into surgery.  Leave your suitcase in the car.  After surgery it may be brought to your room.  For patients admitted to the hospital, discharge time will be determined by your treatment team.  Patients discharged the day of surgery will not be allowed to drive home.    Special instructions:   [facility]- Preparing For Surgery  Before surgery, you can play an important role. Because skin is not sterile, your skin needs to be as free of germs as possible. You can reduce the number of germs on your skin by washing with CHG (chlorahexidine gluconate) Soap before surgery.  CHG is an antiseptic cleaner which kills germs and bonds with the skin to  continue killing germs even after washing.  Please do not use if you have an allergy to CHG or antibacterial soaps. If your skin becomes reddened/irritated stop using the CHG.  Do not shave (including legs and underarms) for at least 48 hours prior to first CHG shower. It is OK to shave your face.  Please follow these instructions carefully.   1. Shower the NIGHT BEFORE SURGERY and the MORNING OF SURGERY with CHG.   2. If you chose to wash your hair, wash your hair first as usual with your normal shampoo.  3. After you shampoo, rinse your hair and body thoroughly to remove the shampoo.  4. Use CHG as you would any other liquid soap. You can apply CHG directly to the skin and wash gently with a scrungie or a clean washcloth.   5. Apply the CHG Soap to your body ONLY FROM THE NECK DOWN.  Do not use on open wounds or open sores. Avoid contact with your eyes, ears, mouth and genitals (private parts). Wash Face and genitals (private parts)  with your normal soap.  6. Wash thoroughly, paying special attention to the area where your surgery will be performed.  7. Thoroughly rinse your body with warm water from the neck down.  8. DO NOT shower/wash with your normal soap after using and rinsing off the CHG Soap.  9. Pat yourself dry with a CLEAN TOWEL.  10. Wear CLEAN PAJAMAS to bed the night before surgery, wear comfortable clothes the morning of  surgery  11. Place CLEAN SHEETS on your bed the night of your first shower and DO NOT SLEEP WITH PETS.    Day of Surgery: Do not apply any deodorants/lotions. Please wear clean clothes to the hospital/surgery center.      Please read over the following fact sheets that you were given.

## 2017-06-23 NOTE — Progress Notes (Signed)
  Echocardiogram 2D Echocardiogram has been performed.  Lindsay [name] L Androw 06/23/2017, 10:31 AM

## 2017-06-24 ENCOUNTER — Encounter (HOSPITAL_COMMUNITY)
Admission: RE | Admit: 2017-06-24 | Discharge: 2017-06-24 | Disposition: A | Discharge status: Home / Self Care | Payer: Medicare Other | Source: Ambulatory Visit | Attending: Surgery | Admitting: Surgery

## 2017-06-24 ENCOUNTER — Encounter (HOSPITAL_COMMUNITY): Payer: Self-pay | Admitting: *Deleted

## 2017-06-24 DIAGNOSIS — Z01812 Encounter for preprocedural laboratory examination: Secondary | ICD-10-CM | POA: Insufficient documentation

## 2017-06-24 DIAGNOSIS — R001 Bradycardia, unspecified: Secondary | ICD-10-CM | POA: Diagnosis not present

## 2017-06-24 DIAGNOSIS — I10 Essential (primary) hypertension: Secondary | ICD-10-CM | POA: Diagnosis not present

## 2017-06-24 DIAGNOSIS — Z01810 Encounter for preprocedural cardiovascular examination: Secondary | ICD-10-CM | POA: Insufficient documentation

## 2017-06-24 HISTORY — DX: Malignant (primary) neoplasm, unspecified: C80.1

## 2017-06-24 HISTORY — DX: Personal history of urinary calculi: Z87.442

## 2017-06-24 HISTORY — DX: Cardiac arrhythmia, unspecified: I49.9

## 2017-06-24 LAB — BASIC METABOLIC PANEL
ANION GAP: 7 (ref 5–15)
BUN: 12 mg/dL (ref 6–20)
CO2: 28 mmol/L (ref 22–32)
CREATININE: 0.87 mg/dL (ref 0.44–1.00)
Calcium: 9 mg/dL (ref 8.9–10.3)
Chloride: 104 mmol/L (ref 101–111)
GFR calc non Af Amer: >60 mL/min (ref >60)
Glucose, Bld: 103 mg/dL — ABNORMAL HIGH (ref 65–99)
POTASSIUM: 4.5 mmol/L (ref 3.5–5.1)
SODIUM: 139 mmol/L (ref 135–145)

## 2017-06-24 LAB — CBC
HEMATOCRIT: 41.6 % (ref 36.0–46.0)
HEMOGLOBIN: 13.6 g/dL (ref 12.0–15.0)
MCH: 28.9 pg (ref 26.0–34.0)
MCHC: 32.7 g/dL (ref 30.0–36.0)
MCV: 88.5 fL (ref 78.0–100.0)
Platelets: 232 10*3/uL (ref 150–400)
RBC: 4.7 MIL/uL (ref 3.87–5.11)
RDW: 14 % (ref 11.5–15.5)
WBC: 9.1 10*3/uL (ref 4.0–10.5)

## 2017-06-24 NOTE — Progress Notes (Addendum)
PCP is Dr. Kennith Maes Cardiologist is Dr Horald Pollen [facility]  Denies any chest pain, fever, or cough. Request EKG and stress test and any heart studies. Echo noted from 06-23-17 Denies ever having a card cath.  Instructed to drink boost by 0700 on the day of surgery. Voices understanding.

## 2017-06-27 ENCOUNTER — Encounter (HOSPITAL_COMMUNITY): Payer: Self-pay

## 2017-06-27 ENCOUNTER — Ambulatory Visit (HOSPITAL_COMMUNITY)
Admission: RE | Admit: 2017-06-27 | Discharge: 2017-06-27 | Disposition: A | Discharge status: Home / Self Care | Payer: Medicare Other | Source: Ambulatory Visit | Attending: Hematology | Admitting: Hematology

## 2017-06-27 DIAGNOSIS — Z171 Estrogen receptor negative status [ER-]: Principal | ICD-10-CM

## 2017-06-27 DIAGNOSIS — C50212 Malignant neoplasm of upper-inner quadrant of left female breast: Secondary | ICD-10-CM

## 2017-06-27 MED ORDER — GADOBENATE DIMEGLUMINE 529 MG/ML IV SOLN: 20.0000 mL | Freq: Once | INTRAVENOUS | Status: DC | PRN

## 2017-06-27 NOTE — Progress Notes (Addendum)
Anesthesia Chart Review:  Pt is a 71 year old female scheduled for insertion of port-a-cath on 06/30/2017 with Coralie Keens, MD  - PCP is Kennith Maes, Md - Cardiologist is Shirlee More, MD. Last office visit 11/01/16 (notes in care everywhere)   PMH includes:   HTN, PACs, breast cancer. Former smoker. BMI 43.   Medications include: acebutolol, ASA 81mg , lipitor, clonidine, hydralazine, olmesartan-hctz.   BP (!) 192/69   Pulse 70   Temp 36.6 C   Resp 20   Ht 5\' 7"  (1.702 m)   Wt 274 lb 1.6 oz (124.3 kg)   SpO2 96%   BMI 42.93 kg/m    - I spoke with pt by telephone.  BP was high at pre-admission testing.  She had taken all of her BP meds that day, but reports she was feeling very anxious and worried.  When she checks her BP at home, she get a reading ~ 155/70.   Preoperative labs reviewed.    EKG 06/24/17: Sinus bradycardia (56 bpm) with PACs  Echo 06/23/17:  - Left ventricle: The cavity size was normal. Wall thickness was increased in a pattern of moderate LVH. Systolic function was normal. The estimated ejection fraction was in the range of 60% to 65%. Wall motion was normal; there were no regional wall motion abnormalities. Left ventricular diastolic function parameters were normal. - Mitral valve: Calcified annulus. Mildly thickened leaflets . - Left atrium: The atrium was mildly dilated. - Atrial septum: There was increased thickness of the septum, consistent with lipomatous hypertrophy. No defect or patent foramen ovale was identified. - Impressions: Normal GLS - 18.6  Holter monitor 10/13/16 [facility] cardiology [place]):  By notes: - Holter monitor done recently was Abnormal  - Basic rhythm was normal sinus . - Average HR was 57 BPM. - Maximum HR was 111 BPM. - Minimum HR was 46 BPM. 1. Ventricular: rare PVCs, one 8-beat run of wide complex tachycardia. 2. Supraventricular: frequent PACs, 6% of all complexes, with occasion runs of SVT, Appear to be ectopic  automatic tachycardias, longest run 17 beats, irregular at 100/min. 3. Bradyarrhythmias and Pauses: No Symptoms reported: Palpitations x 1 - assoc with isolated PACs CONCLUSIONS: 1. Abnormal Holter monitor.  2. Arrhythmia as described: freq PACs and occasional runs non-sustained atrial tachycardia 3. Symptoms were reported  4. Symptoms were not correlated closely with arrhythmias  Nuclear stress test 04/16/14 ([place]  - myocardial perfusion imaging showed no evidence of ischemia. Overall LV systolic function normal without regional wall motion abnormalities. EF 64%.  If no changes, I anticipate pt can proceed with surgery as scheduled.   Willeen Cass, FNP-BC [facility]/Anesthesiology Phone: [phone] 06/27/2017 2:13 PM

## 2017-06-28 ENCOUNTER — Encounter (HOSPITAL_COMMUNITY)
Admission: RE | Admit: 2017-06-28 | Discharge: 2017-06-28 | Disposition: A | Discharge status: Home / Self Care | Payer: Medicare Other | Source: Ambulatory Visit | Attending: Hematology | Admitting: Hematology

## 2017-06-28 ENCOUNTER — Ambulatory Visit (HOSPITAL_COMMUNITY)
Admission: RE | Admit: 2017-06-28 | Discharge: 2017-06-28 | Disposition: A | Discharge status: Home / Self Care | Payer: Medicare Other | Source: Ambulatory Visit | Attending: Hematology | Admitting: Hematology

## 2017-06-28 ENCOUNTER — Encounter (HOSPITAL_COMMUNITY): Payer: Self-pay

## 2017-06-28 DIAGNOSIS — I700 Atherosclerosis of aorta: Secondary | ICD-10-CM | POA: Diagnosis not present

## 2017-06-28 DIAGNOSIS — C50912 Malignant neoplasm of unspecified site of left female breast: Secondary | ICD-10-CM | POA: Diagnosis not present

## 2017-06-28 DIAGNOSIS — Z171 Estrogen receptor negative status [ER-]: Secondary | ICD-10-CM | POA: Diagnosis not present

## 2017-06-28 DIAGNOSIS — I2510 Atherosclerotic heart disease of native coronary artery without angina pectoris: Secondary | ICD-10-CM | POA: Diagnosis not present

## 2017-06-28 DIAGNOSIS — M19011 Primary osteoarthritis, right shoulder: Secondary | ICD-10-CM | POA: Insufficient documentation

## 2017-06-28 DIAGNOSIS — M19012 Primary osteoarthritis, left shoulder: Secondary | ICD-10-CM | POA: Insufficient documentation

## 2017-06-28 DIAGNOSIS — K7689 Other specified diseases of liver: Secondary | ICD-10-CM | POA: Diagnosis not present

## 2017-06-28 DIAGNOSIS — R591 Generalized enlarged lymph nodes: Secondary | ICD-10-CM | POA: Diagnosis not present

## 2017-06-28 DIAGNOSIS — E041 Nontoxic single thyroid nodule: Secondary | ICD-10-CM | POA: Diagnosis not present

## 2017-06-28 DIAGNOSIS — K5730 Diverticulosis of large intestine without perforation or abscess without bleeding: Secondary | ICD-10-CM | POA: Diagnosis not present

## 2017-06-28 DIAGNOSIS — C50212 Malignant neoplasm of upper-inner quadrant of left female breast: Secondary | ICD-10-CM

## 2017-06-28 DIAGNOSIS — M19071 Primary osteoarthritis, right ankle and foot: Secondary | ICD-10-CM | POA: Insufficient documentation

## 2017-06-28 DIAGNOSIS — N281 Cyst of kidney, acquired: Secondary | ICD-10-CM | POA: Insufficient documentation

## 2017-06-28 DIAGNOSIS — M19072 Primary osteoarthritis, left ankle and foot: Secondary | ICD-10-CM | POA: Insufficient documentation

## 2017-06-28 DIAGNOSIS — J432 Centrilobular emphysema: Secondary | ICD-10-CM | POA: Diagnosis not present

## 2017-06-28 DIAGNOSIS — Q278 Other specified congenital malformations of peripheral vascular system: Secondary | ICD-10-CM | POA: Insufficient documentation

## 2017-06-28 DIAGNOSIS — C3490 Malignant neoplasm of unspecified part of unspecified bronchus or lung: Secondary | ICD-10-CM | POA: Diagnosis not present

## 2017-06-28 MED ORDER — IOPAMIDOL (ISOVUE-300) INJECTION 61%
INTRAVENOUS | Status: AC
  Administered 2017-06-28: 100 mL via INTRAVENOUS
  Filled 2017-06-28: qty 100

## 2017-06-28 MED ORDER — TECHNETIUM TC 99M MEDRONATE IV KIT
20.0000 | PACK | Freq: Once | INTRAVENOUS | Status: AC
  Administered 2017-06-28: 20 via INTRAVENOUS

## 2017-06-28 MED ORDER — IOPAMIDOL (ISOVUE-300) INJECTION 61%: 100.0000 mL | Freq: Once | INTRAVENOUS | Status: DC | PRN

## 2017-06-28 NOTE — Progress Notes (Signed)
Open In error. 

## 2017-06-29 MED ORDER — CEFAZOLIN SODIUM 10 G IJ SOLR
3.0000 g | INTRAMUSCULAR | Status: AC
  Administered 2017-06-30: 3 g via INTRAVENOUS
  Filled 2017-06-29: qty 3000
  Filled 2017-06-29: qty 3

## 2017-06-29 NOTE — H&P (Signed)
Lindsay [name] 06/17/2017 9:32 AM Location: [facility] Patient #: [id number] DOB: [birth year] Married / Language: English / Race: White Female   History of Present Illness (Doyne Micke A. [name] MD; 06/17/2017 10:06 AM) The patient is a 71 year old female who presents with breast cancer. This patient is referred by Dr. Kennith Maes for the recent diagnosis of a left breast cancer. She had been having hardness around the nipple and nipple retraction for several months. She ended up having a mammogram and ultrasound showing a spiculated mass underneath the areola of the left breast as well as enlarged lymph nodes. She had a stereotactic biopsy showing her to have invasive left breast cancer. The lymph nodes were positive for invasive cancer as well. 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 She was treated with tamoxifen for this. She reports that she did not have radiation. She is currently otherwise without complaints.   Past Surgical History Alean Rinne, [place]; 06/17/2017 9:32 AM) Breast Biopsy  Left. Colon Polyp Removal - Colonoscopy   Diagnostic Studies History Alean Rinne, [place]; 06/17/2017 9:32 AM) Colonoscopy  1-5 years ago Mammogram  within last year  Allergies Alean Rinne, RMA; 06/17/2017 9:33 AM) No Known Drug Allergies 06/17/2017 Allergies Reconciled   Medication History Alean Rinne, RMA; 06/17/2017 9:34 AM) Acebutolol HCl (200MG  Capsule, Oral) Active. Atorvastatin Calcium (10MG  Tablet, Oral) Active. Cefdinir (300MG  Capsule, Oral) Active. CloNIDine HCl (0.1MG  Tablet, Oral) Active. Escitalopram Oxalate (5MG  Tablet, Oral) Active. HydrALAZINE HCl (25MG  Tablet, Oral) Active. Olmesartan Medoxomil (40MG  Tablet, Oral) Active. Olmesartan Medoxomil-HCTZ (40-25MG  Tablet, Oral) Active. Valsartan (320MG  Tablet, Oral) Active. Medications Reconciled  Social History Alean Rinne, [place]; 06/17/2017 9:32  AM) Caffeine use  Carbonated beverages, Coffee. No alcohol use  No drug use  Tobacco use  Former smoker.  Family History Alean Rinne, [place]; 06/17/2017 9:32 AM) Arthritis  Sister. Breast Cancer  Sister. Colon Cancer  Sister. Diabetes Mellitus  Brother, Sister. Heart Disease  Brother, Sister. Hypertension  Brother, Sister. Malignant Neoplasm Of Pancreas  Mother. Melanoma  Sister.  Pregnancy / Birth History Alean Rinne, [place]; 06/17/2017 9:32 AM) Age at menarche  34 years. Age of menopause  80-55 Gravida  2 Maternal age  22-30 Para  2  Other Problems Alean Rinne, [place]; 06/17/2017 9:32 AM) Breast Cancer  High blood pressure  Hypercholesterolemia  Kidney Stone  Lump In Breast  Vascular Disease     Review of Systems Alean Rinne RMA; 06/17/2017 9:32 AM) General Not Present- Appetite Loss, Chills, Fatigue, Fever, Night Sweats, Weight Gain and Weight Loss. Skin Not Present- Change in Wart/Mole, Dryness, Hives, Jaundice, New Lesions, Non-Healing Wounds, Rash and Ulcer. HEENT Not Present- Earache, Hearing Loss, Hoarseness, Nose Bleed, Oral Ulcers, Ringing in the Ears, Seasonal Allergies, Sinus Pain, Sore Throat, Visual Disturbances, Wears glasses/contact lenses and Yellow Eyes. Respiratory Not Present- Bloody sputum, Chronic Cough, Difficulty Breathing, Snoring and Wheezing. Breast Present- Breast Mass and Skin Changes. Not Present- Breast Pain and Nipple Discharge. Cardiovascular Not Present- Chest Pain, Difficulty Breathing Lying Down, Leg Cramps, Palpitations, Rapid Heart Rate, Shortness of Breath and Swelling of Extremities. Gastrointestinal Not Present- Abdominal Pain, Bloating, Bloody Stool, Change in Bowel Habits, Chronic diarrhea, Constipation, Difficulty Swallowing, Excessive gas, Gets full quickly at meals, Hemorrhoids, Indigestion, Nausea, Rectal Pain and Vomiting. Female Genitourinary Not Present- Frequency, Nocturia, Painful Urination, Pelvic  Pain and Urgency. Musculoskeletal Not Present- Back Pain, Joint Pain, Joint Stiffness, Muscle Pain, Muscle Weakness and Swelling of Extremities. Neurological  Not Present- Decreased Memory, Fainting, Headaches, Numbness, Seizures, Tingling, Tremor, Trouble walking and Weakness. Psychiatric Not Present- Anxiety, Bipolar, Change in Sleep Pattern, Depression, Fearful and Frequent crying. Endocrine Not Present- Cold Intolerance, Excessive Hunger, Hair Changes, Heat Intolerance, Hot flashes and New Diabetes. Hematology Not Present- Blood Thinners, Easy Bruising, Excessive bleeding, Gland problems, HIV and Persistent Infections.  Vitals Alean Rinne RMA; 06/17/2017 9:33 AM) 06/17/2017 9:33 AM Weight: 273.2 lb Height: 67in Body Surface Area: 2.31 m Body Mass Index: 42.79 kg/m  Temp.: 98.27F  Pulse: 41 (Regular)  BP: 155/80 (Sitting, Left Arm, Standard)       Physical Exam (Kou Gucciardo A. [name] MD; 06/17/2017 10:07 AM) General Mental Status-Alert. General Appearance-Consistent with stated age. Hydration-Well hydrated. Voice-Normal.  Head and Neck Head-normocephalic, atraumatic with no lesions or palpable masses. Trachea-midline. Thyroid Gland Characteristics - normal size and consistency.  Eye Eyeball - Bilateral-Extraocular movements intact. Sclera/Conjunctiva - Bilateral-No scleral icterus.  Chest and Lung Exam Chest and lung exam reveals -quiet, even and easy respiratory effort with no use of accessory muscles and on auscultation, normal breath sounds, no adventitious sounds and normal vocal resonance. Inspection Chest Wall - Normal. Back - normal.  Breast Breast - Left-Non Tender, No Biopsy scars, no Dimpling, No Inflammation, No Lumpectomy scars, No Mastectomy scars, No Peau d' Orange. Note: There is inversion of the left nipple and a fullness underneath the areola. I cannot feel any enlarged lymph nodes in the left axilla Breast -  Right-Symmetric, Non Tender, No Biopsy scars, no Dimpling, No Inflammation, No Lumpectomy scars, No Mastectomy scars, No Peau d' Orange.  Cardiovascular Cardiovascular examination reveals -normal heart sounds, regular rate and rhythm with no murmurs and normal pedal pulses bilaterally.  Abdomen Inspection Inspection of the abdomen reveals - No Hernias. Skin - Scar - no surgical scars. Palpation/Percussion Palpation and Percussion of the abdomen reveal - Soft, Non Tender, No Rebound tenderness, No Rigidity (guarding) and No hepatosplenomegaly. Auscultation Auscultation of the abdomen reveals - Bowel sounds normal.  Neurologic - Did not examine.  Musculoskeletal - Did not examine.  Lymphatic Head & Neck  General Head & Neck Lymphatics: Bilateral - Description - Normal. Axillary  General Axillary Region: Bilateral - Description - Normal. Tenderness - Non Tender. Femoral & Inguinal  Generalized Femoral & Inguinal Lymphatics: Bilateral - Description - Normal. Tenderness - Non Tender.    Assessment & Plan (Sharni Negron A. [name] MD; 06/17/2017 10:08 AM) BREAST CANCER METASTASIZED TO AXILLARY LYMPH NODE, LEFT (C50.912) Impression: I discussed the diagnosis with the patient and her family in detail. This is a large central breast cancer that is probably involving the nipple and is metastatic to lymph nodes. I am going to refer her to the cancer center for recommendations. I suspect she will need a mastectomy with axillary dissection and a Port-A-Cath inserted. She would like to have surgery before any chemotherapy if this is clinically indicated. She is not interested in reconstruction. I'll discuss things further with her after she is again oncology and we will consider were to proceed from. She and her family are in agreement with the plans. She would like to be seen at the cancer center here in [place]:  She has seen the oncologist and neoadjuvant chemotherapy has been  recommended.  We will proceed with placement of a port-a-cath.  I discussed port insertion with her in detail.  I discussed the risks which include but are not limited to bleeding, infection, injury to surrounding structures, pneumothorax, the need for other procedures,  etc.  She agrees to proceed.

## 2017-06-30 ENCOUNTER — Encounter (HOSPITAL_COMMUNITY)
Admission: RE | Disposition: A | Discharge status: Home / Self Care | Payer: Self-pay | Source: Ambulatory Visit | Attending: Surgery

## 2017-06-30 ENCOUNTER — Ambulatory Visit (HOSPITAL_COMMUNITY): Payer: Medicare Other | Admitting: Emergency Medicine

## 2017-06-30 ENCOUNTER — Ambulatory Visit (HOSPITAL_COMMUNITY)
Admission: RE | Admit: 2017-06-30 | Discharge: 2017-06-30 | Disposition: A | Discharge status: Home / Self Care | Payer: Medicare Other | Source: Ambulatory Visit | Attending: Surgery | Admitting: Surgery

## 2017-06-30 ENCOUNTER — Encounter (HOSPITAL_COMMUNITY): Payer: Self-pay | Admitting: *Deleted

## 2017-06-30 ENCOUNTER — Ambulatory Visit (HOSPITAL_COMMUNITY): Payer: Medicare Other | Admitting: Certified Registered Nurse Anesthetist

## 2017-06-30 ENCOUNTER — Ambulatory Visit (HOSPITAL_COMMUNITY): Payer: Medicare Other

## 2017-06-30 DIAGNOSIS — Z87891 Personal history of nicotine dependence: Secondary | ICD-10-CM | POA: Diagnosis not present

## 2017-06-30 DIAGNOSIS — Z7982 Long term (current) use of aspirin: Secondary | ICD-10-CM | POA: Diagnosis not present

## 2017-06-30 DIAGNOSIS — Z4682 Encounter for fitting and adjustment of non-vascular catheter: Secondary | ICD-10-CM | POA: Diagnosis not present

## 2017-06-30 DIAGNOSIS — E7800 Pure hypercholesterolemia, unspecified: Secondary | ICD-10-CM | POA: Insufficient documentation

## 2017-06-30 DIAGNOSIS — I10 Essential (primary) hypertension: Secondary | ICD-10-CM | POA: Diagnosis not present

## 2017-06-30 DIAGNOSIS — Z8249 Family history of ischemic heart disease and other diseases of the circulatory system: Secondary | ICD-10-CM | POA: Insufficient documentation

## 2017-06-30 DIAGNOSIS — Z803 Family history of malignant neoplasm of breast: Secondary | ICD-10-CM | POA: Diagnosis not present

## 2017-06-30 DIAGNOSIS — Z95828 Presence of other vascular implants and grafts: Secondary | ICD-10-CM

## 2017-06-30 DIAGNOSIS — Z6841 Body Mass Index (BMI) 40.0 and over, adult: Secondary | ICD-10-CM | POA: Diagnosis not present

## 2017-06-30 DIAGNOSIS — C50912 Malignant neoplasm of unspecified site of left female breast: Secondary | ICD-10-CM | POA: Diagnosis not present

## 2017-06-30 DIAGNOSIS — C773 Secondary and unspecified malignant neoplasm of axilla and upper limb lymph nodes: Secondary | ICD-10-CM | POA: Diagnosis not present

## 2017-06-30 DIAGNOSIS — Z87442 Personal history of urinary calculi: Secondary | ICD-10-CM | POA: Insufficient documentation

## 2017-06-30 DIAGNOSIS — C50212 Malignant neoplasm of upper-inner quadrant of left female breast: Secondary | ICD-10-CM | POA: Diagnosis not present

## 2017-06-30 DIAGNOSIS — Z79899 Other long term (current) drug therapy: Secondary | ICD-10-CM | POA: Insufficient documentation

## 2017-06-30 DIAGNOSIS — Z452 Encounter for adjustment and management of vascular access device: Secondary | ICD-10-CM | POA: Diagnosis not present

## 2017-06-30 DIAGNOSIS — E669 Obesity, unspecified: Secondary | ICD-10-CM | POA: Diagnosis not present

## 2017-06-30 DIAGNOSIS — Z419 Encounter for procedure for purposes other than remedying health state, unspecified: Secondary | ICD-10-CM

## 2017-06-30 HISTORY — PX: PORTACATH PLACEMENT: SHX2246

## 2017-06-30 SURGERY — INSERTION, TUNNELED CENTRAL VENOUS DEVICE, WITH PORT
Anesthesia: General | Site: Chest | Laterality: Right

## 2017-06-30 MED ORDER — FENTANYL CITRATE (PF) 100 MCG/2ML IJ SOLN
INTRAMUSCULAR | Status: DC | PRN
  Administered 2017-06-30: 75 ug via INTRAVENOUS

## 2017-06-30 MED ORDER — ACETAMINOPHEN 500 MG PO TABS
ORAL_TABLET | ORAL | Status: AC
  Administered 2017-06-30: 1000 mg via ORAL
  Filled 2017-06-30: qty 2

## 2017-06-30 MED ORDER — CHLORHEXIDINE GLUCONATE CLOTH 2 % EX PADS: 6.0000 | MEDICATED_PAD | Freq: Once | CUTANEOUS | Status: DC

## 2017-06-30 MED ORDER — ONDANSETRON HCL 4 MG/2ML IJ SOLN
INTRAMUSCULAR | Status: DC | PRN
  Administered 2017-06-30: 4 mg via INTRAVENOUS

## 2017-06-30 MED ORDER — OXYCODONE HCL 5 MG PO TABS: 5.0000 mg | ORAL_TABLET | ORAL | Status: DC | PRN

## 2017-06-30 MED ORDER — GABAPENTIN 300 MG PO CAPS
300.0000 mg | ORAL_CAPSULE | ORAL | Status: AC
  Administered 2017-06-30: 300 mg via ORAL

## 2017-06-30 MED ORDER — HEPARIN SOD (PORK) LOCK FLUSH 100 UNIT/ML IV SOLN
INTRAVENOUS | Status: DC | PRN
  Administered 2017-06-30: 350 [IU]

## 2017-06-30 MED ORDER — SODIUM CHLORIDE 0.9 % IV SOLN: 250.0000 mL | INTRAVENOUS | Status: DC | PRN

## 2017-06-30 MED ORDER — PROPOFOL 10 MG/ML IV BOLUS
INTRAVENOUS | Status: DC | PRN
  Administered 2017-06-30: 80 mg via INTRAVENOUS
  Administered 2017-06-30: 120 mg via INTRAVENOUS

## 2017-06-30 MED ORDER — 0.9 % SODIUM CHLORIDE (POUR BTL) OPTIME
TOPICAL | Status: DC | PRN
  Administered 2017-06-30: 1000 mL

## 2017-06-30 MED ORDER — PROPOFOL 10 MG/ML IV BOLUS
INTRAVENOUS | Status: AC
  Filled 2017-06-30: qty 20

## 2017-06-30 MED ORDER — BUPIVACAINE HCL (PF) 0.25 % IJ SOLN
INTRAMUSCULAR | Status: AC
  Filled 2017-06-30: qty 30

## 2017-06-30 MED ORDER — SODIUM CHLORIDE 0.9% FLUSH: 3.0000 mL | Freq: Two times a day (BID) | INTRAVENOUS | Status: DC

## 2017-06-30 MED ORDER — SODIUM CHLORIDE 0.9% FLUSH: 3.0000 mL | INTRAVENOUS | Status: DC | PRN

## 2017-06-30 MED ORDER — MORPHINE SULFATE (PF) 2 MG/ML IV SOLN: 1.0000 mg | INTRAVENOUS | Status: DC | PRN

## 2017-06-30 MED ORDER — GABAPENTIN 300 MG PO CAPS
ORAL_CAPSULE | ORAL | Status: AC
  Administered 2017-06-30: 300 mg via ORAL
  Filled 2017-06-30: qty 1

## 2017-06-30 MED ORDER — OXYCODONE HCL 5 MG PO TABS: 5.0000 mg | ORAL_TABLET | Freq: Four times a day (QID) | ORAL | 0 refills | Status: DC | PRN

## 2017-06-30 MED ORDER — ACETAMINOPHEN 500 MG PO TABS
1000.0000 mg | ORAL_TABLET | ORAL | Status: AC
  Administered 2017-06-30: 1000 mg via ORAL

## 2017-06-30 MED ORDER — LACTATED RINGERS IV SOLN
INTRAVENOUS | Status: DC
  Administered 2017-06-30: 08:00:00 via INTRAVENOUS

## 2017-06-30 MED ORDER — DEXAMETHASONE SODIUM PHOSPHATE 10 MG/ML IJ SOLN
INTRAMUSCULAR | Status: DC | PRN
  Administered 2017-06-30: 5 mg via INTRAVENOUS

## 2017-06-30 MED ORDER — BUPIVACAINE HCL (PF) 0.25 % IJ SOLN
INTRAMUSCULAR | Status: DC | PRN
  Administered 2017-06-30: 11 mL

## 2017-06-30 MED ORDER — ACETAMINOPHEN 325 MG PO TABS: 650.0000 mg | ORAL_TABLET | ORAL | Status: DC | PRN

## 2017-06-30 MED ORDER — HEPARIN SOD (PORK) LOCK FLUSH 100 UNIT/ML IV SOLN
INTRAVENOUS | Status: AC
  Filled 2017-06-30: qty 5

## 2017-06-30 MED ORDER — FENTANYL CITRATE (PF) 250 MCG/5ML IJ SOLN
INTRAMUSCULAR | Status: AC
  Filled 2017-06-30: qty 5

## 2017-06-30 MED ORDER — SODIUM CHLORIDE 0.9 % IV SOLN
INTRAVENOUS | Status: DC | PRN
  Administered 2017-06-30: 500 mL

## 2017-06-30 MED ORDER — LIDOCAINE HCL (PF) 1 % IJ SOLN
INTRAMUSCULAR | Status: AC
  Filled 2017-06-30: qty 30

## 2017-06-30 MED ORDER — EPHEDRINE SULFATE 50 MG/ML IJ SOLN
INTRAMUSCULAR | Status: DC | PRN
  Administered 2017-06-30 (×2): 20 mg via INTRAVENOUS
  Administered 2017-06-30: 5 mg via INTRAVENOUS

## 2017-06-30 MED ORDER — ACETAMINOPHEN 650 MG RE SUPP: 650.0000 mg | RECTAL | Status: DC | PRN

## 2017-06-30 MED ORDER — LIDOCAINE 2% (20 MG/ML) 5 ML SYRINGE
INTRAMUSCULAR | Status: DC | PRN
  Administered 2017-06-30: 100 mg via INTRAVENOUS

## 2017-06-30 MED ORDER — PHENYLEPHRINE HCL 10 MG/ML IJ SOLN
INTRAMUSCULAR | Status: DC | PRN
  Administered 2017-06-30: 120 ug via INTRAVENOUS
  Administered 2017-06-30: 80 ug via INTRAVENOUS

## 2017-06-30 SURGICAL SUPPLY — 49 items
ADH SKN CLS APL DERMABOND .7 (GAUZE/BANDAGES/DRESSINGS) ×1
BAG DECANTER FOR FLEXI CONT (MISCELLANEOUS) ×3 IMPLANT
BLADE SURG 15 STRL LF DISP TIS (BLADE) ×1 IMPLANT
BLADE SURG 15 STRL SS (BLADE) ×3
CHLORAPREP W/TINT 26ML (MISCELLANEOUS) ×3 IMPLANT
COVER SURGICAL LIGHT HANDLE (MISCELLANEOUS) ×3 IMPLANT
COVER TRANSDUCER ULTRASND GEL (DRAPE) IMPLANT
CRADLE DONUT ADULT HEAD (MISCELLANEOUS) ×3 IMPLANT
DERMABOND ADVANCED (GAUZE/BANDAGES/DRESSINGS) ×2
DERMABOND ADVANCED .7 DNX12 (GAUZE/BANDAGES/DRESSINGS) ×1 IMPLANT
DRAPE C-ARM 42X72 X-RAY (DRAPES) ×3 IMPLANT
DRAPE CHEST BREAST 15X10 FENES (DRAPES) ×3 IMPLANT
DRAPE UTILITY XL STRL (DRAPES) ×6 IMPLANT
DRSG TEGADERM 4X4.75 (GAUZE/BANDAGES/DRESSINGS) ×3 IMPLANT
ELECT CAUTERY BLADE 6.4 (BLADE) ×3 IMPLANT
ELECT REM PT RETURN 9FT ADLT (ELECTROSURGICAL) ×3
ELECTRODE REM PT RTRN 9FT ADLT (ELECTROSURGICAL) ×1 IMPLANT
GAUZE SPONGE 2X2 8PLY STRL LF (GAUZE/BANDAGES/DRESSINGS) ×1 IMPLANT
GAUZE SPONGE 4X4 16PLY XRAY LF (GAUZE/BANDAGES/DRESSINGS) ×3 IMPLANT
GEL ULTRASOUND 20GR AQUASONIC (MISCELLANEOUS) IMPLANT
GLOVE SURG SIGNA 7.5 PF LTX (GLOVE) ×3 IMPLANT
GOWN STRL REUS W/ TWL LRG LVL3 (GOWN DISPOSABLE) ×1 IMPLANT
GOWN STRL REUS W/ TWL XL LVL3 (GOWN DISPOSABLE) ×1 IMPLANT
GOWN STRL REUS W/TWL LRG LVL3 (GOWN DISPOSABLE) ×3
GOWN STRL REUS W/TWL XL LVL3 (GOWN DISPOSABLE) ×3
INTRODUCER COOK 11FR (CATHETERS) IMPLANT
KIT BASIN OR (CUSTOM PROCEDURE TRAY) ×3 IMPLANT
KIT PORT POWER 8FR ISP CVUE (Miscellaneous) ×3 IMPLANT
KIT ROOM TURNOVER OR (KITS) ×3 IMPLANT
NEEDLE HYPO 25GX1X1/2 BEV (NEEDLE) ×3 IMPLANT
NS IRRIG 1000ML POUR BTL (IV SOLUTION) ×3 IMPLANT
PACK SURGICAL SETUP 50X90 (CUSTOM PROCEDURE TRAY) ×3 IMPLANT
PAD ARMBOARD 7.5X6 YLW CONV (MISCELLANEOUS) ×3 IMPLANT
PENCIL BUTTON HOLSTER BLD 10FT (ELECTRODE) ×3 IMPLANT
SET INTRODUCER 12FR PACEMAKER (SHEATH) IMPLANT
SET SHEATH INTRODUCER 10FR (MISCELLANEOUS) IMPLANT
SHEATH COOK PEEL AWAY SET 9F (SHEATH) IMPLANT
SPONGE GAUZE 2X2 STER 10/PKG (GAUZE/BANDAGES/DRESSINGS) ×2
SUT MNCRL AB 4-0 PS2 18 (SUTURE) ×3 IMPLANT
SUT PROLENE 2 0 SH 30 (SUTURE) ×3 IMPLANT
SUT SILK 2 0 (SUTURE)
SUT SILK 2-0 18XBRD TIE 12 (SUTURE) IMPLANT
SUT VIC AB 3-0 SH 27 (SUTURE) ×3
SUT VIC AB 3-0 SH 27XBRD (SUTURE) ×1 IMPLANT
SYR 20ML ECCENTRIC (SYRINGE) ×6 IMPLANT
SYR 5ML LUER SLIP (SYRINGE) ×3 IMPLANT
SYR CONTROL 10ML LL (SYRINGE) ×3 IMPLANT
TOWEL OR 17X24 6PK STRL BLUE (TOWEL DISPOSABLE) ×3 IMPLANT
TOWEL OR 17X26 10 PK STRL BLUE (TOWEL DISPOSABLE) ×3 IMPLANT

## 2017-06-30 NOTE — Discharge Instructions (Signed)
Ok to shower tomorrow after chemo  Ice pack, tylenol also for pain

## 2017-06-30 NOTE — Anesthesia Preprocedure Evaluation (Addendum)
Anesthesia Evaluation  Patient identified by MRN, date of birth, ID band Patient awake    Reviewed: Allergy & Precautions, NPO status , Patient's Chart, lab work & pertinent test results  Airway Mallampati: II  TM Distance: >3 FB Neck ROM: Full    Dental  (+) Edentulous Upper, Edentulous Lower   Pulmonary former smoker,    breath sounds clear to auscultation       Cardiovascular hypertension, Pt. on medications  Rhythm:Regular Rate:Normal     Neuro/Psych    GI/Hepatic negative GI ROS, Neg liver ROS,   Endo/Other  negative endocrine ROS  Renal/GU negative Renal ROS     Musculoskeletal negative musculoskeletal ROS (+)   Abdominal (+) + obese,   Peds  Hematology negative hematology ROS (+)   Anesthesia Other Findings   Reproductive/Obstetrics negative OB ROS                            Lab Results  Component Value Date   WBC 9.1 06/24/2017   HGB 13.6 06/24/2017   HCT 41.6 06/24/2017   MCV 88.5 06/24/2017   PLT 232 06/24/2017   Lab Results  Component Value Date   CREATININE 0.87 06/24/2017   BUN 12 06/24/2017   NA 139 06/24/2017   K 4.5 06/24/2017   CL 104 06/24/2017   CO2 28 06/24/2017   No results found for: INR, PROTIME  EKG: normal sinus rhythm, PAC's noted.  Echo: - Left ventricle: The cavity size was normal. Wall thickness was   increased in a pattern of moderate LVH. Systolic function was   normal. The estimated ejection fraction was in the range of 60%   to 65%. Wall motion was normal; there were no regional wall   motion abnormalities. Left ventricular diastolic function   parameters were normal. - Mitral valve: Calcified annulus. Mildly thickened leaflets . - Left atrium: The atrium was mildly dilated. - Atrial septum: There was increased thickness of the septum,   consistent with lipomatous hypertrophy. No defect or patent   foramen ovale was identified. -  Impressions: Normal GLS - 18.6   Anesthesia Physical Anesthesia Plan  ASA: III  Anesthesia Plan: General   Post-op Pain Management:    Induction: Intravenous  PONV Risk Score and Plan: Ondansetron and Dexamethasone  Airway Management Planned: LMA  Additional Equipment:   Intra-op Plan:   Post-operative Plan:   Informed Consent: I have reviewed the patients History and Physical, chart, labs and discussed the procedure including the risks, benefits and alternatives for the proposed anesthesia with the patient or authorized representative who has indicated his/her understanding and acceptance.     Plan Discussed with: CRNA and Anesthesiologist  Anesthesia Plan Comments:         Anesthesia Quick Evaluation

## 2017-06-30 NOTE — Interval H&P Note (Signed)
History and Physical Interval Note:no change in H and P  06/30/2017 7:32 AM  Lindsay [name]  has presented today for surgery, with the diagnosis of breast cancer  The various methods of treatment have been discussed with the patient and family. After consideration of risks, benefits and other options for treatment, the patient has consented to  Procedure(s): [place] (N/A) as a surgical intervention .  The patient's history has been reviewed, patient examined, no change in status, stable for surgery.  I have reviewed the patient's chart and labs.  Questions were answered to the patient's satisfaction.     [name] A

## 2017-06-30 NOTE — Transfer of Care (Signed)
Immediate Anesthesia Transfer of Care Note  Patient: Lindsay [name]  Procedure(s) Performed: INSERTION PORT-A-CATH ERAS PATHWAY (Right Chest)  Patient Location: PACU  Anesthesia Type:General  Level of Consciousness: awake, alert , oriented and patient cooperative  Airway & Oxygen Therapy: Patient Spontanous Breathing  Post-op Assessment: Report given to RN and Post -op Vital signs reviewed and stable  Post vital signs: Reviewed and stable  Last Vitals:  Vitals:   06/30/17 0658 06/30/17 0903  BP: (!) 207/73 (!) 193/65  Pulse: 62   Resp: 20   Temp: 36.8 C   SpO2: 97%     Last Pain:  Vitals:   06/30/17 0658  TempSrc: Oral         Complications: No apparent anesthesia complications

## 2017-06-30 NOTE — Anesthesia Procedure Notes (Signed)
Procedure Name: LMA Insertion Date/Time: 06/30/2017 10:27 AM Performed by: Salli Quarry Algie Cales Pre-anesthesia Checklist: Patient identified, Emergency Drugs available, Suction available and Patient being monitored Patient Re-evaluated:Patient Re-evaluated prior to induction Oxygen Delivery Method: Circle System Utilized Preoxygenation: Pre-oxygenation with 100% oxygen Induction Type: IV induction Ventilation: Mask ventilation without difficulty LMA: LMA inserted LMA Size: 4.0 Number of attempts: 1 Airway Equipment and Method: Bite block Placement Confirmation: positive ETCO2 Tube secured with: Tape Dental Injury: Teeth and Oropharynx as per pre-operative assessment

## 2017-06-30 NOTE — Anesthesia Postprocedure Evaluation (Signed)
Anesthesia Post Note  Patient: Lindsay [name]  Procedure(s) Performed: INSERTION PORT-A-CATH ERAS PATHWAY (Right Chest)     Patient location during evaluation: PACU Anesthesia Type: General Level of consciousness: awake and alert Pain management: pain level controlled Vital Signs Assessment: post-procedure vital signs reviewed and stable Respiratory status: spontaneous breathing, nonlabored ventilation, respiratory function stable and patient connected to nasal cannula oxygen Cardiovascular status: blood pressure returned to baseline and stable Postop Assessment: no apparent nausea or vomiting Anesthetic complications: no    Last Vitals:  Vitals:   06/30/17 1200 06/30/17 1215  BP: (!) 174/75 (!) 155/82  Pulse: 60 64  Resp: 14 18  Temp:    SpO2: 94% 95%    Last Pain:  Vitals:   06/30/17 1115  TempSrc:   PainSc: 0-No pain                 Effie Berkshire

## 2017-06-30 NOTE — Op Note (Addendum)
INSERTION PORT-[name]-CATH ERAS PATHWAY  Procedure Note  Lindsay [name] 06/30/2017   Pre-op Diagnosis: breast cancer     Post-op Diagnosis: same  Procedure(s): INSERTION PORT-[name]-CATH ERAS PATHWAY RIGHT SUBCLAVIAN VEIN  (8 Fr clear view)  Surgeon(s): Coralie Keens, MD  Anesthesia: General  Staff:  Circulator: Rozell Searing, RN; Kennieth Francois, RN Radiology Technologist: Mila Palmer T Relief Scrub: Liberty Handy, CST Scrub Person: Caswell Corwin T  Estimated Blood Loss: Minimal               Indications: [name] with [name] large left breast cancer that is metastatic to her lymph nodes.  The decision has been made to place [name] for the start of intravenous chemotherapy  Procedure: The patient was brought to the operating room and identified as the correct patient.  She was placed supine on the operating table and general anesthesia was induced.  Her right chest and neck were then prepped and draped in the usual sterile fashion.  I anesthetized the skin on the upper chest and clavicle area with Marcaine.  I then used the introducer needle and syringe to cannulate the right subclavian vein with the patient in the Trendelenburg position.  The vein was easily cannulated.  I passed [name] guidewire through the needle and instrument central venous system under fluoroscopy.  The needle was then removed.  I anesthetized skin further and made an incision at the introduction site with [name] scalpel.  I then created [name] pocket for the port with the cautery.  An 8 [place] Port-[name]-Cath was brought onto the field.  It was flushed.  The catheter was inserted into the port.  I then passed the venous dilator introducer sheath over the wire and into the central venous system.  The wire was then removed.  I placed the port into the pocket and then fed the catheter down the peel-away sheath.  The sheath was then completely peeled away leaving the catheter in the central venous  system.  I accessed the port and good flush and return were demonstrated.  It was confirmed to be in the superior vena cava under fluoroscopy.  I then sewed the port in place with 2 separate 3-0 Prolene sutures.  I then accessed the port through the skin.  Again it seemed to flush well.  I then instilled the port with concentrated heparin.  I left the port accessed.  I then closed the subtenons tissue with interrupted 3-0 Vicryl sutures and closed the skin with [name] running 4-0 Monocryl.  Dermabond was then applied.  An occlusive dressing was then placed.  The patient tolerated the procedure well.  All the counts were correct at the end of the procedure.  The patient was then taken in [name] stable condition from the operating room to the recovery room.            Lindsay [name]   Date: 06/30/2017  Time: 11:01 AM

## 2017-07-01 ENCOUNTER — Telehealth: Payer: Self-pay | Admitting: Hematology

## 2017-07-01 ENCOUNTER — Ambulatory Visit: Payer: Medicare Other

## 2017-07-01 ENCOUNTER — Ambulatory Visit (HOSPITAL_BASED_OUTPATIENT_CLINIC_OR_DEPARTMENT_OTHER): Payer: Medicare Other | Admitting: Hematology

## 2017-07-01 ENCOUNTER — Ambulatory Visit (HOSPITAL_BASED_OUTPATIENT_CLINIC_OR_DEPARTMENT_OTHER): Payer: Medicare Other

## 2017-07-01 ENCOUNTER — Encounter: Payer: Self-pay | Admitting: *Deleted

## 2017-07-01 ENCOUNTER — Other Ambulatory Visit (HOSPITAL_BASED_OUTPATIENT_CLINIC_OR_DEPARTMENT_OTHER): Payer: Medicare Other

## 2017-07-01 ENCOUNTER — Encounter (HOSPITAL_COMMUNITY): Payer: Self-pay | Admitting: Surgery

## 2017-07-01 VITALS — BP 149/47 | HR 70 | Temp 98.1°F | Resp 18 | Ht 67.0 in | Wt 274.9 lb

## 2017-07-01 DIAGNOSIS — Z171 Estrogen receptor negative status [ER-]: Secondary | ICD-10-CM

## 2017-07-01 DIAGNOSIS — C50212 Malignant neoplasm of upper-inner quadrant of left female breast: Secondary | ICD-10-CM

## 2017-07-01 DIAGNOSIS — Z5111 Encounter for antineoplastic chemotherapy: Secondary | ICD-10-CM | POA: Diagnosis not present

## 2017-07-01 DIAGNOSIS — D72829 Elevated white blood cell count, unspecified: Secondary | ICD-10-CM | POA: Diagnosis not present

## 2017-07-01 DIAGNOSIS — Z95828 Presence of other vascular implants and grafts: Secondary | ICD-10-CM

## 2017-07-01 DIAGNOSIS — I4891 Unspecified atrial fibrillation: Secondary | ICD-10-CM

## 2017-07-01 DIAGNOSIS — C773 Secondary and unspecified malignant neoplasm of axilla and upper limb lymph nodes: Secondary | ICD-10-CM

## 2017-07-01 DIAGNOSIS — I10 Essential (primary) hypertension: Secondary | ICD-10-CM | POA: Diagnosis not present

## 2017-07-01 HISTORY — DX: Presence of other vascular implants and grafts: Z95.828

## 2017-07-01 LAB — COMPREHENSIVE METABOLIC PANEL
ALBUMIN: 3.9 g/dL (ref 3.5–5.0)
ALK PHOS: 44 U/L (ref 40–150)
ALT: 16 U/L (ref 0–55)
AST: 19 U/L (ref 5–34)
Anion Gap: 8 mEq/L (ref 3–11)
BUN: 16.4 mg/dL (ref 7.0–26.0)
CALCIUM: 9.4 mg/dL (ref 8.4–10.4)
CO2: 27 mEq/L (ref 22–29)
Chloride: 103 mEq/L (ref 98–109)
Creatinine: 0.9 mg/dL (ref 0.6–1.1)
Glucose: 89 mg/dl (ref 70–140)
POTASSIUM: 3.9 meq/L (ref 3.5–5.1)
Sodium: 139 mEq/L (ref 136–145)
Total Bilirubin: 0.5 mg/dL (ref 0.20–1.20)
Total Protein: 6.9 g/dL (ref 6.4–8.3)

## 2017-07-01 LAB — CBC WITH DIFFERENTIAL/PLATELET
BASO%: 0.2 % (ref 0.0–2.0)
BASOS ABS: 0 10*3/uL (ref 0.0–0.1)
EOS ABS: 0 10*3/uL (ref 0.0–0.5)
EOS%: 0.3 % (ref 0.0–7.0)
HEMATOCRIT: 40.8 % (ref 34.8–46.6)
HEMOGLOBIN: 13.1 g/dL (ref 11.6–15.9)
LYMPH#: 3.1 10*3/uL (ref 0.9–3.3)
LYMPH%: 20.4 % (ref 14.0–49.7)
MCH: 28.7 pg (ref 25.1–34.0)
MCHC: 32.1 g/dL (ref 31.5–36.0)
MCV: 89.3 fL (ref 79.5–101.0)
MONO#: 2.3 10*3/uL — AB (ref 0.1–0.9)
MONO%: 15.6 % — ABNORMAL HIGH (ref 0.0–14.0)
NEUT#: 9.5 10*3/uL — ABNORMAL HIGH (ref 1.5–6.5)
NEUT%: 63.5 % (ref 38.4–76.8)
Platelets: 226 10*3/uL (ref 145–400)
RBC: 4.57 10*6/uL (ref 3.70–5.45)
RDW: 14 % (ref 11.2–14.5)
WBC: 15 10*3/uL — ABNORMAL HIGH (ref 3.9–10.3)

## 2017-07-01 MED ORDER — SODIUM CHLORIDE 0.9% FLUSH
10.0000 mL | INTRAVENOUS | Status: DC | PRN
  Administered 2017-07-01: 10 mL
  Filled 2017-07-01: qty 10

## 2017-07-01 MED ORDER — SODIUM CHLORIDE 0.9 % IV SOLN
Freq: Once | INTRAVENOUS | Status: AC
  Administered 2017-07-01: 14:00:00 via INTRAVENOUS
  Filled 2017-07-01: qty 5

## 2017-07-01 MED ORDER — SODIUM CHLORIDE 0.9 % IV SOLN
Freq: Once | INTRAVENOUS | Status: AC
  Administered 2017-07-01: 14:00:00 via INTRAVENOUS

## 2017-07-01 MED ORDER — DOXORUBICIN HCL CHEMO IV INJECTION 2 MG/ML
60.0000 mg/m2 | Freq: Once | INTRAVENOUS | Status: AC
  Administered 2017-07-01: 144 mg via INTRAVENOUS
  Filled 2017-07-01: qty 72

## 2017-07-01 MED ORDER — HEPARIN SOD (PORK) LOCK FLUSH 100 UNIT/ML IV SOLN
500.0000 [IU] | Freq: Once | INTRAVENOUS | Status: AC | PRN
  Administered 2017-07-01: 500 [IU]
  Filled 2017-07-01: qty 5

## 2017-07-01 MED ORDER — CYCLOPHOSPHAMIDE CHEMO INJECTION 1 GM
600.0000 mg/m2 | Freq: Once | INTRAMUSCULAR | Status: AC
  Administered 2017-07-01: 1440 mg via INTRAVENOUS
  Filled 2017-07-01: qty 72

## 2017-07-01 MED ORDER — SODIUM CHLORIDE 0.9% FLUSH
10.0000 mL | INTRAVENOUS | Status: DC | PRN
  Administered 2017-07-01: 10 mL via INTRAVENOUS
  Filled 2017-07-01: qty 10

## 2017-07-01 MED ORDER — PALONOSETRON HCL INJECTION 0.25 MG/5ML
0.2500 mg | Freq: Once | INTRAVENOUS | Status: AC
  Administered 2017-07-01: 0.25 mg via INTRAVENOUS

## 2017-07-01 MED ORDER — PEGFILGRASTIM 6 MG/0.6ML ~~LOC~~ PSKT
6.0000 mg | PREFILLED_SYRINGE | Freq: Once | SUBCUTANEOUS | Status: AC
  Administered 2017-07-01: 6 mg via SUBCUTANEOUS
  Filled 2017-07-01: qty 0.6

## 2017-07-01 MED ORDER — PALONOSETRON HCL INJECTION 0.25 MG/5ML
INTRAVENOUS | Status: AC
  Filled 2017-07-01: qty 5

## 2017-07-01 NOTE — Patient Instructions (Signed)
[facility] Discharge Instructions for Patients Receiving Chemotherapy  Today you received the following chemotherapy agents doxorubicin (Adriamycin) and cyclophosphamide (Cytoxan) And the following pre-medications palonesetron (Aloxi), fosaprepitant (Emend), and dexamethasone (Decadron) And the following post-medications pegfilgrastim (Neulasta)  To help prevent nausea and vomiting after your treatment, we encourage you to take your nausea medication as directed by your doctor.   If you develop nausea and vomiting that is not controlled by your nausea medication, call the clinic.   BELOW ARE SYMPTOMS THAT SHOULD BE REPORTED IMMEDIATELY:  *FEVER GREATER THAN 100.5 F  *CHILLS WITH OR WITHOUT FEVER  NAUSEA AND VOMITING THAT IS NOT CONTROLLED WITH YOUR NAUSEA MEDICATION  *UNUSUAL SHORTNESS OF BREATH  *UNUSUAL BRUISING OR BLEEDING  TENDERNESS IN MOUTH AND THROAT WITH OR WITHOUT PRESENCE OF ULCERS  *URINARY PROBLEMS  *BOWEL PROBLEMS  UNUSUAL RASH Items with * indicate a potential emergency and should be followed up as soon as possible.  Feel free to call the clinic should you have any questions or concerns. The clinic phone number is [phone].  Please show the [facility] at check-in to the Emergency Department and triage nurse.  Doxorubicin injection What is this medicine? DOXORUBICIN (dox oh ROO bi sin) is a chemotherapy drug. It is used to treat many kinds of cancer like leukemia, lymphoma, neuroblastoma, sarcoma, and Wilms' tumor. It is also used to treat bladder cancer, breast cancer, lung cancer, ovarian cancer, stomach cancer, and thyroid cancer. This medicine may be used for other purposes; ask your health care provider or pharmacist if you have questions. COMMON BRAND NAME(S): Adriamycin, Adriamycin PFS, Adriamycin RDF, Rubex What should I tell my health care provider before I take this medicine? They need to know if you have any of these  conditions: -heart disease -history of low blood counts caused by a medicine -liver disease -recent or ongoing radiation therapy -an unusual or allergic reaction to doxorubicin, other chemotherapy agents, other medicines, foods, dyes, or preservatives -pregnant or trying to get pregnant -breast-feeding How should I use this medicine? This drug is given as an infusion into a vein. It is administered in a hospital or clinic by a specially trained health care professional. If you have pain, swelling, burning or any unusual feeling around the site of your injection, tell your health care professional right away. Talk to your pediatrician regarding the use of this medicine in children. Special care may be needed. Overdosage: If you think you have taken too much of this medicine contact a poison control center or emergency room at once. NOTE: This medicine is only for you. Do not share this medicine with others. What if I miss a dose? It is important not to miss your dose. Call your doctor or health care professional if you are unable to keep an appointment. What may interact with this medicine? This medicine may interact with the following medications: -6-mercaptopurine -paclitaxel -phenytoin -St. John's Wort -trastuzumab -verapamil This list may not describe all possible interactions. Give your health care provider a list of all the medicines, herbs, non-prescription drugs, or dietary supplements you use. Also tell them if you smoke, drink alcohol, or use illegal drugs. Some items may interact with your medicine. What should I watch for while using this medicine? This drug may make you feel generally unwell. This is not uncommon, as chemotherapy can affect healthy cells as well as cancer cells. Report any side effects. Continue your course of treatment even though you feel ill unless your doctor  tells you to stop. There is a maximum amount of this medicine you should receive throughout your  life. The amount depends on the medical condition being treated and your overall health. Your doctor will watch how much of this medicine you receive in your lifetime. Tell your doctor if you have taken this medicine before. You may need blood work done while you are taking this medicine. Your urine may turn red for a few days after your dose. This is not blood. If your urine is dark or brown, call your doctor. In some cases, you may be given additional medicines to help with side effects. Follow all directions for their use. Call your doctor or health care professional for advice if you get a fever, chills or sore throat, or other symptoms of a cold or flu. Do not treat yourself. This drug decreases your body's ability to fight infections. Try to avoid being around people who are sick. This medicine may increase your risk to bruise or bleed. Call your doctor or health care professional if you notice any unusual bleeding. Talk to your doctor about your risk of cancer. You may be more at risk for certain types of cancers if you take this medicine. Do not become pregnant while taking this medicine or for 6 months after stopping it. Women should inform their doctor if they wish to become pregnant or think they might be pregnant. Men should not father a child while taking this medicine and for 6 months after stopping it. There is a potential for serious side effects to an unborn child. Talk to your health care professional or pharmacist for more information. Do not breast-feed an infant while taking this medicine. This medicine has caused ovarian failure in some women and reduced sperm counts in some men This medicine may interfere with the ability to have a child. Talk with your doctor or health care professional if you are concerned about your fertility. What side effects may I notice from receiving this medicine? Side effects that you should report to your doctor or health care professional as soon as  possible: -allergic reactions like skin rash, itching or hives, swelling of the face, lips, or tongue -breathing problems -chest pain -fast or irregular heartbeat -low blood counts - this medicine may decrease the number of white blood cells, red blood cells and platelets. You may be at increased risk for infections and bleeding. -pain, redness, or irritation at site where injected -signs of infection - fever or chills, cough, sore throat, pain or difficulty passing urine -signs of decreased platelets or bleeding - bruising, pinpoint red spots on the skin, black, tarry stools, blood in the urine -swelling of the ankles, feet, hands -tiredness -weakness Side effects that usually do not require medical attention (report to your doctor or health care professional if they continue or are bothersome): -diarrhea -hair loss -mouth sores -nail discoloration or damage -nausea -red colored urine -vomiting This list may not describe all possible side effects. Call your doctor for medical advice about side effects. You may report side effects to FDA at 1-800-FDA-1088. Where should I keep my medicine? This drug is given in a hospital or clinic and will not be stored at home. NOTE: This sheet is a summary. It may not cover all possible information. If you have questions about this medicine, talk to your doctor, pharmacist, or health care provider.  2018 [employer]/Gold Standard (2015-10-13 11:28:51)  Cyclophosphamide injection What is this medicine? CYCLOPHOSPHAMIDE (sye kloe FOSS fa mide) is  a chemotherapy drug. It slows the growth of cancer cells. This medicine is used to treat many types of cancer like lymphoma, myeloma, leukemia, breast cancer, and ovarian cancer, to name a few. This medicine may be used for other purposes; ask your health care provider or pharmacist if you have questions. COMMON BRAND NAME(S): Cytoxan, Neosar What should I tell my health care provider before I take this  medicine? They need to know if you have any of these conditions: -blood disorders -history of other chemotherapy -infection -kidney disease -liver disease -recent or ongoing radiation therapy -tumors in the bone marrow -an unusual or allergic reaction to cyclophosphamide, other chemotherapy, other medicines, foods, dyes, or preservatives -pregnant or trying to get pregnant -breast-feeding How should I use this medicine? This drug is usually given as an injection into a vein or muscle or by infusion into a vein. It is administered in a hospital or clinic by a specially trained health care professional. Talk to your pediatrician regarding the use of this medicine in children. Special care may be needed. Overdosage: If you think you have taken too much of this medicine contact a poison control center or emergency room at once. NOTE: This medicine is only for you. Do not share this medicine with others. What if I miss a dose? It is important not to miss your dose. Call your doctor or health care professional if you are unable to keep an appointment. What may interact with this medicine? This medicine may interact with the following medications: -amiodarone -amphotericin B -azathioprine -certain antiviral medicines for HIV or AIDS such as protease inhibitors (e.g., indinavir, ritonavir) and zidovudine -certain blood pressure medications such as benazepril, captopril, enalapril, fosinopril, lisinopril, moexipril, monopril, perindopril, quinapril, ramipril, trandolapril -certain cancer medications such as anthracyclines (e.g., daunorubicin, doxorubicin), busulfan, cytarabine, paclitaxel, pentostatin, tamoxifen, trastuzumab -certain diuretics such as chlorothiazide, chlorthalidone, hydrochlorothiazide, indapamide, metolazone -certain medicines that treat or prevent blood clots like warfarin -certain muscle relaxants such as succinylcholine -cyclosporine -etanercept -indomethacin -medicines  to increase blood counts like filgrastim, pegfilgrastim, sargramostim -medicines used as general anesthesia -metronidazole -natalizumab This list may not describe all possible interactions. Give your health care provider a list of all the medicines, herbs, non-prescription drugs, or dietary supplements you use. Also tell them if you smoke, drink alcohol, or use illegal drugs. Some items may interact with your medicine. What should I watch for while using this medicine? Visit your doctor for checks on your progress. This drug may make you feel generally unwell. This is not uncommon, as chemotherapy can affect healthy cells as well as cancer cells. Report any side effects. Continue your course of treatment even though you feel ill unless your doctor tells you to stop. Drink water or other fluids as directed. Urinate often, even at night. In some cases, you may be given additional medicines to help with side effects. Follow all directions for their use. Call your doctor or health care professional for advice if you get a fever, chills or sore throat, or other symptoms of a cold or flu. Do not treat yourself. This drug decreases your body's ability to fight infections. Try to avoid being around people who are sick. This medicine may increase your risk to bruise or bleed. Call your doctor or health care professional if you notice any unusual bleeding. Be careful brushing and flossing your teeth or using a toothpick because you may get an infection or bleed more easily. If you have any dental work done, tell your dentist you  are receiving this medicine. You may get drowsy or dizzy. Do not drive, use machinery, or do anything that needs mental alertness until you know how this medicine affects you. Do not become pregnant while taking this medicine or for 1 year after stopping it. Women should inform their doctor if they wish to become pregnant or think they might be pregnant. Men should not father a child  while taking this medicine and for 4 months after stopping it. There is a potential for serious side effects to an unborn child. Talk to your health care professional or pharmacist for more information. Do not breast-feed an infant while taking this medicine. This medicine may interfere with the ability to have a child. This medicine has caused ovarian failure in some women. This medicine has caused reduced sperm counts in some men. You should talk with your doctor or health care professional if you are concerned about your fertility. If you are going to have surgery, tell your doctor or health care professional that you have taken this medicine. What side effects may I notice from receiving this medicine? Side effects that you should report to your doctor or health care professional as soon as possible: -allergic reactions like skin rash, itching or hives, swelling of the face, lips, or tongue -low blood counts - this medicine may decrease the number of white blood cells, red blood cells and platelets. You may be at increased risk for infections and bleeding. -signs of infection - fever or chills, cough, sore throat, pain or difficulty passing urine -signs of decreased platelets or bleeding - bruising, pinpoint red spots on the skin, black, tarry stools, blood in the urine -signs of decreased red blood cells - unusually weak or tired, fainting spells, lightheadedness -breathing problems -dark urine -dizziness -palpitations -swelling of the ankles, feet, hands -trouble passing urine or change in the amount of urine -weight gain -yellowing of the eyes or skin Side effects that usually do not require medical attention (report to your doctor or health care professional if they continue or are bothersome): -changes in nail or skin color -hair loss -missed menstrual periods -mouth sores -nausea, vomiting This list may not describe all possible side effects. Call your doctor for medical advice  about side effects. You may report side effects to FDA at 1-800-FDA-1088. Where should I keep my medicine? This drug is given in a hospital or clinic and will not be stored at home. NOTE: This sheet is a summary. It may not cover all possible information. If you have questions about this medicine, talk to your doctor, pharmacist, or health care provider.  2018 [employer] Standard (2012-06-30 16:22:58)

## 2017-07-01 NOTE — Telephone Encounter (Signed)
Gave avs and calendar for November  °

## 2017-07-01 NOTE — Progress Notes (Signed)
[place]  [phone] [phone]  Clinic Follow up Note   Patient Care Team: Lindsay Hipps, MD as PCP - General (Family Medicine) Lindsay Keens, MD as Consulting Physician (General Surgery) Lindsay Priest, MD as Consulting Physician (Cardiology) 07/01/2017  CHIEF COMPLAINT: f/u left breast cancer, triple negative, stage IIIb  SUMMARY OF ONCOLOGIC HISTORY: Oncology History   Cancer Staging Malignant neoplasm of upper-inner quadrant of left breast in female, estrogen receptor negative ([place]) Staging form: Breast, AJCC 8th Edition - Clinical stage from 06/10/2017: Stage IIIB (cT2, cN1, cM0, G3, ER: Negative, PR: Negative, HER2: Negative) - Signed by Lindsay Merle, MD on 06/21/2017       Malignant neoplasm of upper-inner quadrant of left breast in female, estrogen receptor negative ([place])   06/08/2017 Imaging    DIAGNOSTIC MAMMO and [place] Left Breast 06/08/17 IMPRESSION:  Highly suspicious palpable left breast 11 o'clock mass which measures 2.8 cm in greatest dimension. Ductal extension anteriorly to the level of the nipple, and posteriorly for additional 1.8cm, is suspicious for tumor extension.       06/10/2017 Initial Diagnosis    Malignant neoplasm of upper-inner quadrant of left breast in female, estrogen receptor negative ([place])      06/10/2017 Pathology Results    Diagnosis 06/10/17 1.) Breast, left, needle core biopsy -INVASIVE DUCTAL CARCINOMA, GRADE 3, WITH FOCAL HIGH GRADE DUCTAL CARCINOMA IN SITU -NEOPLASM INVOLVES ALL CORES AND MEASURES APPROXIMATELY 1.5CM IN MAXIMAL LINEAR DIMENSION  2.) Lymph node, needle/core biopsy, left axilla -METASTATIC CARCINOMA CONSISTENT WITH BREAST PRIMARY       06/10/2017 Receptors her2    ER: -, PR-, HER2 not amplified       06/23/2017 Echocardiogram    ECHO 06/23/17 Study Conclusions - Left ventricle: The cavity size was normal. Wall thickness was   increased in a pattern of moderate LVH.  Systolic function was   normal. The estimated ejection fraction was in the range of 60%   to 65%. Wall motion was normal; there were no regional wall   motion abnormalities. Left ventricular diastolic function   parameters were normal. - Mitral valve: Calcified annulus. Mildly thickened leaflets . - Left atrium: The atrium was mildly dilated. - Atrial septum: There was increased thickness of the septum,   consistent with lipomatous hypertrophy. No defect or patent   foramen ovale was identified. - Impressions: Normal GLS - 18.6 Impressions: - Normal GLS - 18.6      06/28/2017 Imaging    CT CAP W Contrast 06/28/17 IMPRESSION: 1. Mildly enlarged left axillary lymph node at 1.5 cm, a significant change from the prior chest CT from 2015, suspicious for left axillary metastatic disease. 2. Left upper breast lesion 2.1 cm in diameter with higher density margins and lower density center, favoring lumpectomy site. 3. Mildly enlarged right hilar lymph node, but stable compared back through 2015. 4. No findings of osseous metastatic disease or additional metastatic sites. 5. Other imaging findings of potential clinical significance: Aberrant right subclavian artery passes behind the esophagus. Aortic Atherosclerosis (ICD10-I70.0) and Emphysema (ICD10-J43.9). Coronary atherosclerosis. Mild nodular enlargement of the left inferior thyroid lobe, isodense to the rest of the thyroid. Airway thickening is present, suggesting bronchitis or reactive airways disease. Left hepatic lobe cyst. Left renal cyst. Likely small cysts in the right kidney. Sigmoid colon diverticulosis.       06/28/2017 Imaging    Bone Scan 06/28/17 IMPRESSION: No evidence of osseous metastatic disease. Subtle increased radiotracer uptake within the left chest  wall, likely within the left breast.      06/30/2017 Surgery    Port placement by Dr. Ninfa [name] on 06/30/17      07/01/2017 -  Chemotherapy    Neoadjuvant  Adriamycin and Cytoxan (AC) every 2 weeks for 4 cycles starting 07/01/17, followed byTaxol every 2 weeks for 4 cycles        CURRENT THERAPY: neoadjuvant Adriamcycin/Cytoxan with onpro neulasta q2 weeks x4 cycles, start 07/01/17  INTERVAL HISTORY: Ms. Lindsay [name] returns today for f/u as scheduled with her sister prior to first cycle AC. She had PAC placed on 06/30/17 by Dr. Ninfa [name] without difficulty. She attended chemo class, and picked up anti-emetics from pharmacy. She had baseline echocardiogram. She completed CT CAP and bone scan but has not done MRI, machine at [facility] could not accomadoate her. She feels well and ready to begin treatment. Denies fever or recent infection.   REVIEW OF SYSTEMS:   Constitutional: Denies fatigue, fevers, chills or abnormal weight loss. Good appetite Eyes: Denies blurriness of vision Ears, nose, mouth, throat, and face: Denies mucositis, nasal congestion, or sore throat Respiratory: Denies cough or wheezes. (+) DOE at baseline (+) emphysema per chest CT Cardiovascular: Denies palpitation, chest discomfort or lower extremity swelling Gastrointestinal:  Denies nausea, vomiting, constipation, diarrhea, heartburn or change in bowel habits Skin: Denies abnormal skin rashes Lymphatics: Denies new lymphadenopathy or easy bruising Neurological:Denies numbness, tingling or new weaknesses Behavioral/Psych: Mood is stable, no new changes  All other systems were reviewed with the patient and are negative.  MEDICAL HISTORY:  Past Medical History:  Diagnosis Date  . Cancer [facility])    left breast cancer  . Dysrhythmia    PACs and nonsustained runs of atrial tach by Holter 10/13/16  . History of kidney stones   . Hypertension     SURGICAL HISTORY: Past Surgical History:  Procedure Laterality Date  . COLONOSCOPY    . FOOT SURGERY Right   . left breast lumpectomy  2000  . PORTACATH PLACEMENT Right 06/30/2017   Procedure: [place];  Surgeon:  Lindsay Keens, MD;  Location: [facility];  Service: General;  Laterality: Right;    I have reviewed the social history and family history with the patient and they are unchanged from previous note.  ALLERGIES:  has No Known Allergies.  MEDICATIONS:  Current Outpatient Prescriptions  Medication Sig Dispense Refill  . acebutolol (SECTRAL) 200 MG capsule Take 200 mg by mouth 2 (two) times daily.    Marland Kitchen aspirin EC 81 MG tablet Take 81 mg by mouth daily.    Marland Kitchen atorvastatin (LIPITOR) 10 MG tablet Take 10 mg by mouth at bedtime.    . calcium carbonate (OS-CAL) 600 MG TABS tablet Take 1,200 mg by mouth daily.    . Cholecalciferol (VITAMIN D3) 2000 units TABS Take 2,000 Units by mouth daily.     . cloNIDine (CATAPRES) 0.1 MG tablet Take 0.1 mg by mouth 2 (two) times daily.    . DOCOSAHEXAENOIC ACID PO Take 1,200 mg by mouth 2 (two) times daily.    Marland Kitchen escitalopram (LEXAPRO) 5 MG tablet Take 5 mg by mouth daily.    . hydrALAZINE (APRESOLINE) 25 MG tablet Take 12.5 mg by mouth 3 (three) times daily.    Marland Kitchen lidocaine-prilocaine (EMLA) cream Apply 1 application topically as needed. 30 g 2  . loratadine (CLARITIN) 10 MG tablet Take 10 mg by mouth daily.    Marland Kitchen LORazepam (ATIVAN) 1 MG tablet Take 1 tablet (1 mg total)  by mouth once as needed for anxiety. Take 1 tab 1 hour before MRI scan 2 tablet 0  . magnesium oxide (MAG-OX) 400 MG tablet Take 400 mg by mouth daily.    . Multiple Vitamins-Minerals (EYE VITAMINS PO) Take 1 capsule by mouth 2 (two) times daily.    Marland Kitchen olmesartan-hydrochlorothiazide (BENICAR HCT) 40-25 MG tablet Take 1 tablet by mouth daily.  1  . vitamin B-12 (CYANOCOBALAMIN) 1000 MCG tablet Take 1 tablet by mouth daily.    . ondansetron (ZOFRAN) 8 MG tablet Take 1 tablet (8 mg total) by mouth every 8 (eight) hours as needed for nausea or vomiting. (Patient not taking: Reported on 07/01/2017) 20 tablet 2  . prochlorperazine (COMPAZINE) 10 MG tablet Take 1 tablet (10 mg total) by mouth every 6 (six)  hours as needed for nausea or vomiting. (Patient not taking: Reported on 07/01/2017) 30 tablet 2   No current facility-administered medications for this visit.    Facility-Administered Medications Ordered in Other Visits  Medication Dose Route Frequency Provider Last Rate Last Dose  . cyclophosphamide (CYTOXAN) 1,440 mg in sodium chloride 0.9 % 250 mL chemo infusion  600 mg/m2 (Treatment Plan Recorded) Intravenous Once Lindsay Merle, MD      . DOXOrubicin (ADRIAMYCIN) chemo injection 144 mg  60 mg/m2 (Treatment Plan Recorded) Intravenous Once Lindsay Merle, MD      . fosaprepitant (EMEND) 150 mg, dexamethasone (DECADRON) 12 mg in sodium chloride 0.9 % 145 mL IVPB   Intravenous Once Lindsay Merle, MD      . heparin lock flush 100 unit/mL  500 Units Intracatheter Once PRN Lindsay Merle, MD      . palonosetron (ALOXI) injection 0.25 mg  0.25 mg Intravenous Once Lindsay Merle, MD      . pegfilgrastim (NEULASTA ONPRO KIT) injection 6 mg  6 mg Subcutaneous Once Cira Rue K, NP      . sodium chloride flush (NS) 0.9 % injection 10 mL  10 mL Intracatheter PRN Lindsay Merle, MD        PHYSICAL EXAMINATION: ECOG PERFORMANCE STATUS: 0 - Asymptomatic  Vitals:   07/01/17 1210  BP: (!) 149/47  Pulse: 70  Resp: 18  Temp: 98.1 F (36.7 C)  SpO2: 97%   Filed Weights   07/01/17 1210  Weight: 274 lb 14.4 oz (124.7 kg)    GENERAL:alert, no distress and comfortable SKIN: skin color, texture, turgor are normal, no rashes or significant lesions EYES: normal, Conjunctiva are pink and non-injected, sclera clear OROPHARYNX:no exudate, no erythema and lips, buccal mucosa, and tongue normal  NECK: supple, thyroid normal size, non-tender, without nodularity LYMPH:  no palpable cervical, supraclavicular, or axillary lymphadenopathy LUNGS: clear to auscultation bilaterally with normal breathing effort HEART: regular rate & rhythm and no murmurs and no lower extremity edema ABDOMEN:abdomen soft, non-tender and normal bowel  sounds Musculoskeletal:no cyanosis of digits and no clubbing  NEURO: alert & oriented x 3 with fluent speech, no focal motor/sensory deficits BREAST: inspection shows them to be symmetrical with bilateral nipple inversion. (+) left breast 3x1.5 cm palpable mass in upper central region, no palpable mass in the axilla. No palpable mass in right breast or axilla.  PAC without erythema.  LABORATORY DATA:  I have reviewed the data as listed CBC Latest Ref Rng & Units 07/01/2017 06/24/2017 06/21/2017  WBC 3.9 - 10.3 10e3/uL 15.0(H) 9.1 9.2  Hemoglobin 11.6 - 15.9 g/dL 13.1 13.6 14.1  Hematocrit 34.8 - 46.6 % 40.8 41.6 43.0  Platelets 145 - 400  10e3/uL 226 232 217     CMP Latest Ref Rng & Units 07/01/2017 06/24/2017 06/21/2017  Glucose 70 - 140 mg/dl 89 103(H) 109  BUN 7.0 - 26.0 mg/dL 16.4 12 14.4  Creatinine 0.6 - 1.1 mg/dL 0.9 0.87 0.9  Sodium 136 - 145 mEq/L 139 139 143  Potassium 3.5 - 5.1 mEq/L 3.9 4.5 3.9  Chloride 101 - 111 mmol/L - 104 -  CO2 22 - 29 mEq/L _0 Calcium 8.4 - 10.4 mg/dL 9.4 9.0 9.6  Total Protein 6.4 - 8.3 g/dL 6.9 - 7.2  Total Bilirubin 0.20 - 1.20 mg/dL 0.50 - 0.56  Alkaline Phos 40 - 150 U/L 44 - 51  AST 5 - 34 U/L 19 - 23  ALT 0 - 55 U/L 16 - 21     RADIOGRAPHIC STUDIES: I have personally reviewed the radiological images as listed and agreed with the findings in the report. Dg Chest Port 1 View  Result Date: 06/30/2017 CLINICAL DATA:  Port-A-Cath placement, history of newly diagnosed left breast carcinoma EXAM: PORTABLE CHEST 1 VIEW COMPARISON:  CT chest of 06/28/2017 and chest x-ray of 08/23/2014 FINDINGS: A right-sided Port-A-Cath now present. The tip overlies the upper mid SVC. No pneumothorax is seen. Mild cardiomegaly remains. IMPRESSION: Right-sided Port-A-Cath tip overlies the mid upper SVC. No pneumothorax. Electronically Signed   By: Ivar Drape M.D.   On: 06/30/2017 11:57   Dg Fluoro Guide Cv Line-no Report  Result Date:  06/30/2017 Fluoroscopy was utilized by the requesting physician.  No radiographic interpretation.     CT CAP w contrast 06/28/17 IMPRESSION: 1. Mildly enlarged left axillary lymph node at 1.5 cm, a significant change from the prior chest CT from 2015, suspicious for left axillary metastatic disease. 2. Left upper breast lesion 2.1 cm in diameter with higher density margins and lower density center, favoring lumpectomy site. 3. Mildly enlarged right hilar lymph node, but stable compared back through 2015. 4. No findings of osseous metastatic disease or additional metastatic sites. 5. Other imaging findings of potential clinical significance: Aberrant right subclavian artery passes behind the esophagus. Aortic Atherosclerosis (ICD10-I70.0) and Emphysema (ICD10-J43.9). Coronary atherosclerosis. Mild nodular enlargement of the left inferior thyroid lobe, isodense to the rest of the thyroid. Airway thickening is present, suggesting bronchitis or reactive airways disease. Left hepatic lobe cyst. Left renal cyst. Likely small cysts in the right kidney. Sigmoid colon diverticulosis.  BONE SCAN 06/28/17 IMPRESSION: No evidence of osseous metastatic disease.  Subtle increased radiotracer uptake within the left chest wall, likely within the left breast.   ASSESSMENT & PLAN: Lindsay [name] is a wonderful 71 y.o. female who presents today to discuss the ongoing management of her left breast cancer.   1. Malignant neoplasm of upper-inner quadrant of left breast of female, invasive ductal carcinoma, cT2N0M0, Stage IIIb, ER/PR: negative, HER2: negative, Grade 3.  2. Afib, HTN 3. Genetics   Ms. Dangler appears well today, ready to begin first cycle neoadjuvant AC with Onpro neulasta. CT CAP and bone scan without evidence of metastasis. MRI was not done due to logistics, will be rescheduled at [place] early next week on open table. Labs indicate normal Cmet and mild leukocytosis on CBC.  Physical exam without obvious signs of infection. She has palpable mass in the left breast, will monitor closely during neoadjuvant chemo; if it gets larger during treatment we would get imaging to evaluate further. We reviewed how to manage side effects such as nausea/vomiting, diarrhea, bone pain, fever,  dehydration; she knows when to call clinic. She will proceed with cycle 1 AC with Onpro today x4 cycles followed by weekly taxol x12; return in 2 weeks for f/u and next cycle. She will have genetics counseling apt 11/26.   PLAN: -Labs reviewed, proceed with cycle 1 AC with Onpro today -Plan for breast MRI early next week 11/5 or 11/6 -Genetics counseling 11/26 -Return in 2 weeks prior to cycle 2  All questions were answered. The patient knows to call the clinic with any problems, questions or concerns. No barriers to learning was detected.  I spent 20 minutes counseling the patient face to face. The total time spent in the appointment was 25 minutes and more than 50% was on counseling and review of test results     Alla Feeling, NP 07/01/17   I have seen the patient, examined her. I agree with the assessment and and plan and have edited the notes.   Lindsay [name]  07/01/2017

## 2017-07-14 ENCOUNTER — Telehealth: Payer: Self-pay | Admitting: Emergency Medicine

## 2017-07-14 NOTE — Telephone Encounter (Signed)
Pt declined to schedule MRI @ [facility] d/t cost issues. PT states she can not afford to pay for this scan and will talk to Dr.Feng about it at her next appt. Lacie NP made aware of this.

## 2017-07-14 NOTE — Progress Notes (Signed)
[facility]  [phone] Fax:([phone]) [place] Note   Patient Care Team: Ronita Hipps, MD as PCP - General (Family Medicine) Coralie Keens, MD as Consulting Physician (General Surgery) Richardo Priest, MD as Consulting Physician (Cardiology) 07/15/2017  CHIEF COMPLAINTS/PURPOSE OF CONSULTATION:  Newly diagnosed left breast cancer, triple negative, stage IIIB  ONCOLOGY HISTORY: Oncology History   Cancer Staging Malignant neoplasm of upper-inner quadrant of left breast in female, estrogen receptor negative ([place]) Staging form: Breast, AJCC 8th Edition - Clinical stage from 06/10/2017: Stage IIIB (cT2, cN1, cM0, G3, ER: Negative, PR: Negative, HER2: Negative) - Signed by Truitt Merle, MD on 06/21/2017       Malignant neoplasm of upper-inner quadrant of left breast in female, estrogen receptor negative ([place])   06/08/2017 Imaging    DIAGNOSTIC MAMMO and [place] Left Breast 06/08/17 IMPRESSION:  Highly suspicious palpable left breast 11 o'clock mass which measures 2.8 cm in greatest dimension. Ductal extension anteriorly to the level of the nipple, and posteriorly for additional 1.8cm, is suspicious for tumor extension.       06/10/2017 Initial Diagnosis    Malignant neoplasm of upper-inner quadrant of left breast in female, estrogen receptor negative ([place])      06/10/2017 Pathology Results    Diagnosis 06/10/17 1.) Breast, left, needle core biopsy -INVASIVE DUCTAL CARCINOMA, GRADE 3, WITH FOCAL HIGH GRADE DUCTAL CARCINOMA IN SITU -NEOPLASM INVOLVES ALL CORES AND MEASURES APPROXIMATELY 1.5CM IN MAXIMAL LINEAR DIMENSION  2.) Lymph node, needle/core biopsy, left axilla -METASTATIC CARCINOMA CONSISTENT WITH BREAST PRIMARY       06/10/2017 Receptors her2    ER: -, PR-, HER2 not amplified       06/23/2017 Echocardiogram    ECHO 06/23/17 Study Conclusions - Left ventricle: The cavity size was normal. Wall thickness was   increased  in a pattern of moderate LVH. Systolic function was   normal. The estimated ejection fraction was in the range of 60%   to 65%. Wall motion was normal; there were no regional wall   motion abnormalities. Left ventricular diastolic function   parameters were normal. - Mitral valve: Calcified annulus. Mildly thickened leaflets . - Left atrium: The atrium was mildly dilated. - Atrial septum: There was increased thickness of the septum,   consistent with lipomatous hypertrophy. No defect or patent   foramen ovale was identified. - Impressions: Normal GLS - 18.6 Impressions: - Normal GLS - 18.6      06/28/2017 Imaging    CT CAP W Contrast 06/28/17 IMPRESSION: 1. Mildly enlarged left axillary lymph node at 1.5 cm, a significant change from the prior chest CT from 2015, suspicious for left axillary metastatic disease. 2. Left upper breast lesion 2.1 cm in diameter with higher density margins and lower density center, favoring lumpectomy site. 3. Mildly enlarged right hilar lymph node, but stable compared back through 2015. 4. No findings of osseous metastatic disease or additional metastatic sites. 5. Other imaging findings of potential clinical significance: Aberrant right subclavian artery passes behind the esophagus. Aortic Atherosclerosis (ICD10-I70.0) and Emphysema (ICD10-J43.9). Coronary atherosclerosis. Mild nodular enlargement of the left inferior thyroid lobe, isodense to the rest of the thyroid. Airway thickening is present, suggesting bronchitis or reactive airways disease. Left hepatic lobe cyst. Left renal cyst. Likely small cysts in the right kidney. Sigmoid colon diverticulosis.       06/28/2017 Imaging    Bone Scan 06/28/17 IMPRESSION: No evidence of osseous metastatic disease. Subtle increased radiotracer uptake within the  left chest wall, likely within the left breast.      06/30/2017 Surgery    Port placement by Dr. [name] on 06/30/17      07/01/2017 -   Chemotherapy    Neoadjuvant Adriamycin and Cytoxan (AC) every 2 weeks for 4 cycles starting 07/01/17, followed byTaxol every 2 weeks for 4 cycles        HISTORY OF PRESENTING ILLNESS:  Lindsay [name] 71 y.o. female is here because of left breast cancer.   Initially, the patient palpated a lump within the retroareolar region of the left breast at the end of July. She also had noticed some left nipple inversion, soreness, and hardness around the nipple for several months prior. In the past three months she reports that her soreness had improved originally, but since then she has not noticed any changes or worsening. She denies any abdominal pain, back pain, cough, chest pain, loss of appetite, weight loss, or any other associated symptoms in this time period.   She subsequently underwent mammogram and US of the left breast including the axilla which was notable for a suspicious mass within the 11 o'clock region of the left breast measuring at 2.8cm in the greatest dimension. Following this, she underwent stereotactic needle-core biopsy showing her to have invasive ductal carcinoma, histologic analysis demonstrating triple negative breast cancer, Ki67 20%. Additionally, an enlarged lymph node was surveyed within the left axilla which was positive for metastatic carcinoma.   On 06/17/17, she met with Dr [name] of [facility]. Given her triple negative cancer and the invasive nature of her disease, he recommended mastectomy with SLN dissection with port placement. She was subsequently referred into medical oncology for ongoing management of her disease to consider beginning surgery vs chemotherapy as the first round of treatment.   She reports today with her husband and sister. Overall she has been doing well and without notable acute complaints. Her breast soreness has resolved since initial onset. She has no other symptoms of concern to her. Of note, both of her sisters have also been  diagnosed with breast cancer and she has an extensive family history of other cancers, including her mother who had pancreatic cancer.   GYN HISTORY  Menarchal: 71 years of age LMP: 21 years ago Contraceptive: Yes in her youth HRT: none GP: G2P2, she was 71yo at the age of first delivery. She did not breast feed.   She did previously smoke for approximately 50 years. She quit three years ago on August 4th, 2015. On average, she smoked 2ppd. She does not drink any alcoholic beverages and does not partake in illicit drugs.    CURRENT THERAPY: Neoadjuvant Adriamycin and Cytoxan (AC) every 2 weeks for 4 cycles starting 07/01/17, followed byTaxol every 2 weeks for 4 cycles, reduced 10% due to poor toleration starting with cycle 2.    INTERVAL HISTORY:  ODELIA GRACIANO is here for a follow up and cycle 2 AC. She presents to the clinic today accompanied by her family member.  She reports she struggled after first treatment. Two days after treatment she had nausea, fatigue and had one episode of diarrhea, took imodium AD and then become constipated. She had significant gas and bloating, she took Miralax which resolved it. She broke out in a cold sweat and temperature went up to 100.4. She took Aleve and broke her fever. his occurred five days after chemo. Her hair is thinning now. She has SOB upon exertion. She denies chest pain. This  is not new but has increased since chemotherapy. She was active with working before diagnosis. She no longer has the energy for this. She did not recover until yesterday. She made herself function, but it took time. She has lower back pain, mild, from onpro. She is would like the 10% dose reduction of chemo. She had not gotten the MRI because she cannot afford it. She has [employer]. She also does not like the MRI. She plans to follow up with Dr. [name] soon. She thinks her breast mass has shrunk some. She loss weight due to diarrhea and does not eat as much. She does drink protein  shakes.     MEDICAL HISTORY:  Past Medical History:  Diagnosis Date  . Cancer [facility])    left breast cancer  . Dysrhythmia    PACs and nonsustained runs of atrial tach by Holter 10/13/16  . History of kidney stones   . Hypertension     SURGICAL HISTORY: Past Surgical History:  Procedure Laterality Date  . COLONOSCOPY    . FOOT SURGERY Right   . INSERTION PORT-A-CATH ERAS PATHWAY Right 06/30/2017   Performed by Coralie Keens, MD at [facility]  . left breast lumpectomy  2000    SOCIAL HISTORY: Social History   Socioeconomic History  . Marital status: Married    Spouse name: Not on file  . Number of children: Not on file  . Years of education: Not on file  . Highest education level: Not on file  Social Needs  . Financial resource strain: Not on file  . Food insecurity - worry: Not on file  . Food insecurity - inability: Not on file  . Transportation needs - medical: Not on file  . Transportation needs - non-medical: Not on file  Occupational History  . Not on file  Tobacco Use  . Smoking status: Former Smoker    Packs/day: 2.00    Years: 50.00    Pack years: 100.00    Last attempt to quit: 06/02/2014    Years since quitting: 3.1  . Smokeless tobacco: Never Used  Substance and Sexual Activity  . Alcohol use: No  . Drug use: No  . Sexual activity: Yes  Other Topics Concern  . Not on file  Social History Narrative  . Not on file    FAMILY HISTORY: Family History  Problem Relation Age of Onset  . Cancer Mother 63       pancreatic cancer   . Cancer Sister 31       breast cancer   . Cancer Paternal Aunt        breast cancer   . Cancer Sister 72       breast cancer   . Cancer Sister        malenoma   . Cancer Sister 19       colon cancer     ALLERGIES:  has No Known Allergies.  MEDICATIONS:  Current Outpatient Medications  Medication Sig Dispense Refill  . acebutolol (SECTRAL) 200 MG capsule Take 200 mg by mouth 2 (two) times daily.    Marland Kitchen aspirin EC  81 MG tablet Take 81 mg by mouth daily.    Marland Kitchen atorvastatin (LIPITOR) 10 MG tablet Take 10 mg by mouth at bedtime.    . calcium carbonate (OS-CAL) 600 MG TABS tablet Take 1,200 mg by mouth daily.    . Cholecalciferol (VITAMIN D3) 2000 units TABS Take 2,000 Units by mouth daily.     . cloNIDine (  CATAPRES) 0.1 MG tablet Take 0.1 mg by mouth 2 (two) times daily.    . DOCOSAHEXAENOIC ACID PO Take 1,200 mg by mouth 2 (two) times daily.    Marland Kitchen escitalopram (LEXAPRO) 5 MG tablet Take 5 mg by mouth daily.    . hydrALAZINE (APRESOLINE) 25 MG tablet Take 12.5 mg by mouth 3 (three) times daily.    Marland Kitchen lidocaine-prilocaine (EMLA) cream Apply 1 application topically as needed. 30 g 2  . loratadine (CLARITIN) 10 MG tablet Take 10 mg by mouth daily.    Marland Kitchen LORazepam (ATIVAN) 1 MG tablet Take 1 tablet (1 mg total) by mouth once as needed for anxiety. Take 1 tab 1 hour before MRI scan 2 tablet 0  . magnesium oxide (MAG-OX) 400 MG tablet Take 400 mg by mouth daily.    Marland Kitchen olmesartan-hydrochlorothiazide (BENICAR HCT) 40-25 MG tablet Take 1 tablet by mouth daily.  1  . ondansetron (ZOFRAN) 8 MG tablet Take 1 tablet (8 mg total) by mouth every 8 (eight) hours as needed for nausea or vomiting. 20 tablet 2  . prochlorperazine (COMPAZINE) 10 MG tablet Take 1 tablet (10 mg total) by mouth every 6 (six) hours as needed for nausea or vomiting. 30 tablet 2  . vitamin B-12 (CYANOCOBALAMIN) 1000 MCG tablet Take 1 tablet by mouth daily.    . Multiple Vitamins-Minerals (EYE VITAMINS PO) Take 1 capsule by mouth 2 (two) times daily.     No current facility-administered medications for this visit.     REVIEW OF SYSTEMS:   Constitutional: Denies fevers, chills or abnormal night sweats (+) weight loss (+) hair thinning (+) significant fatigue  Eyes: Denies blurriness of vision, double vision or watery eyes Ears, nose, mouth, throat, and face: Denies mucositis or sore throat Respiratory: Denies cough, wheezes (+) SOB upon exertion  increased Cardiovascular: Denies palpitation, chest discomfort or lower extremity swelling Gastrointestinal:  Denies nausea, heartburn or change in bowel habits Skin: Denies abnormal skin rashes Lymphatics: Denies new lymphadenopathy or easy bruising MSK: (+) Mild lower back pain  Neurological:Denies numbness, tingling or new weaknesses Behavioral/Psych: Mood is stable, no new changes  All other systems were reviewed with the patient and are negative.  PHYSICAL EXAMINATION: ECOG PERFORMANCE STATUS: 2-3  Vitals:   07/15/17 1158  BP: (!) 167/69  Pulse: 81  Resp: 20  Temp: 98.4 F (36.9 C)  SpO2: 95%   Filed Weights   07/15/17 1158  Weight: 269 lb 1.6 oz (122.1 kg)    GENERAL:alert, no distress and comfortable SKIN: skin color, texture, turgor are normal, no rashes or significant lesions EYES: normal, conjunctiva are pink and non-injected, sclera clear OROPHARYNX:no exudate, no erythema and lips, buccal mucosa, and tongue normal  NECK: supple, thyroid normal size, non-tender, without nodularity LYMPH:  no palpable lymphadenopathy in the cervical, axillary or inguinal LUNGS: clear to auscultation and percussion with normal breathing effort HEART: regular rate & rhythm and no murmurs and no lower extremity edema ABDOMEN:abdomen soft, non-tender and normal bowel sounds Musculoskeletal:no cyanosis of digits and no clubbing  PSYCH: alert & oriented x 3 with fluent speech NEURO: no focal motor/sensory deficits BREAST: She has some redness and skin edema in the areolar area of the left breast with bilateral nipple inversion. Additionally, there is a (previously 3x1.5cm) now 2.5x1.5cm palpable mass within the 11 o'clock to 1 o'clock region of the left breast. Softer than before. Otherwise both axilla and right breast are without palpable mass, nodes, or notable changes.    LABORATORY DATA:  I have reviewed the data as listed CBC Latest Ref Rng & Units 07/15/2017 07/01/2017 06/24/2017    WBC 3.9 - 10.3 10e3/uL 6.8 15.0(H) 9.1  Hemoglobin 11.6 - 15.9 g/dL 11.3(L) 13.1 13.6  Hematocrit 34.8 - 46.6 % 34.3(L) 40.8 41.6  Platelets 145 - 400 10e3/uL 354 226 232   PATHOLOGY:  Diagnosis 06/10/17 1.) Breast, left, needle core biopsy -INVASIVE DUCTAL CARCINOMA, GRADE 3, WITH FOCAL HIGH GRADE DUCTAL CARCINOMA IN SITU -NEOPLASM INVOLVES ALL CORES AND MEASURES APPROXIMATELY 1.5CM IN MAXIMAL LINEAR DIMENSION -A BREAST PROGNOSTIC PROFILE WILL BE ORDERED ON BLOCK 1A AND SEPARATELY REPORTED   2.) Lymph node, needle/core biopsy, left axilla -METASTATIC CARCINOMA CONSISTENT WITH BREAST PRIMARY    PROCEDURES  ECHO 06/23/17 Study Conclusions - Left ventricle: The cavity size was normal. Wall thickness was   increased in a pattern of moderate LVH. Systolic function was   normal. The estimated ejection fraction was in the range of 60%   to 65%. Wall motion was normal; there were no regional wall   motion abnormalities. Left ventricular diastolic function   parameters were normal. - Mitral valve: Calcified annulus. Mildly thickened leaflets . - Left atrium: The atrium was mildly dilated. - Atrial septum: There was increased thickness of the septum,   consistent with lipomatous hypertrophy. No defect or patent   foramen ovale was identified. - Impressions: Normal GLS - 18.6 Impressions: - Normal GLS - 18.6   RADIOGRAPHIC STUDIES: I have personally reviewed the radiological images as listed and agreed with the findings in the report. Ct Chest W Contrast  Result Date: 06/28/2017 CLINICAL DATA:  Left breast cancer diagnosed in October 2018, staging workup. EXAM: CT CHEST, ABDOMEN, AND PELVIS WITH CONTRAST TECHNIQUE: Multidetector CT imaging of the chest, abdomen and pelvis was performed following the standard protocol during bolus administration of intravenous contrast. CONTRAST:  100 cc Isovue 300 COMPARISON:  Multiple exams, including prior CT exams from 08/23/2014 and 11/26/1998  FINDINGS: CT CHEST FINDINGS Cardiovascular: Aberrant right subclavian artery passes behind the esophagus. Coronary, aortic arch, and branch vessel atherosclerotic vascular disease. Calcifications of the mitral valve noted. Mediastinum/Nodes: Enlarged left thyroid lobe with slightly nodular extension into the upper mediastinum. Right hilar lymph node 1.1 cm on image 25/2, stable. Left axillary lymph node 1.5 cm on image 15/2, enlarged compared to prior. Left upper breast lesion with higher density margins and lower density center, 2.1 by 2.0 cm on image 19/2. Lungs/Pleura: Mild biapical pleuroparenchymal scarring. Severe centrilobular emphysema. Airway thickening is present, suggesting bronchitis or reactive airways disease. Mild airway plugging in the left lower lobe. Musculoskeletal: Unremarkable CT ABDOMEN PELVIS FINDINGS Hepatobiliary: 1.2 by 0.7 cm hypodense lesion in segment 2 of the liver is stable from 2009 and compatible with a cyst. Gallbladder unremarkable. Pancreas: Unremarkable Spleen: Unremarkable Adrenals/Urinary Tract: Several small hypodense right renal lesions are statistically likely to be cysts but technically too small to characterize. A 2.1 by 1.8 cm fluid density lesion of the left kidney upper pole is compatible with a small cyst. Urinary bladder unremarkable. Stomach/Bowel: Sigmoid colon diverticulosis without active diverticulitis. Vascular/Lymphatic: Aortoiliac atherosclerotic vascular disease. Small porta hepatis lymph nodes are not pathologically enlarged. Reproductive: Unremarkable Other: No supplemental non-categorized findings. Musculoskeletal: Mild lumbar spondylosis and degenerative disc disease. IMPRESSION: 1. Mildly enlarged left axillary lymph node at 1.5 cm, a significant change from the prior chest CT from 2015, suspicious for left axillary metastatic disease. 2. Left upper breast lesion 2.1 cm in diameter with higher density margins and lower density  center, favoring lumpectomy  site. 3. Mildly enlarged right hilar lymph node, but stable compared back through 2015. 4. No findings of osseous metastatic disease or additional metastatic sites. 5. Other imaging findings of potential clinical significance: Aberrant right subclavian artery passes behind the esophagus. Aortic Atherosclerosis (ICD10-I70.0) and Emphysema (ICD10-J43.9). Coronary atherosclerosis. Mild nodular enlargement of the left inferior thyroid lobe, isodense to the rest of the thyroid. Airway thickening is present, suggesting bronchitis or reactive airways disease. Left hepatic lobe cyst. Left renal cyst. Likely small cysts in the right kidney. Sigmoid colon diverticulosis. Electronically Signed   By: Van Clines M.D.   On: 06/28/2017 09:57   Nm Bone Scan Whole Body  Result Date: 06/28/2017 CLINICAL DATA:  Staging breast cancer. EXAM: NUCLEAR MEDICINE WHOLE BODY BONE SCAN TECHNIQUE: Whole body anterior and posterior images were obtained approximately 3 hours after intravenous injection of radiopharmaceutical. RADIOPHARMACEUTICALS:  Twenty mCi Technetium-86mMDP IV COMPARISON:  None. FINDINGS: Subtle increased radiotracer uptake within the left chest wall, may be related to left breast post lumpectomy changes or active breast cancer. Otherwise, physiologic distribution of the radiotracer. Osteoarthritic changes of bilateral shoulders and ankles. IMPRESSION: No evidence of osseous metastatic disease. Subtle increased radiotracer uptake within the left chest wall, likely within the left breast. Electronically Signed   By: DFidela SalisburyM.D.   On: 06/28/2017 14:16   Ct Abdomen Pelvis W Contrast  Result Date: 06/28/2017 CLINICAL DATA:  Left breast cancer diagnosed in October 2018, staging workup. EXAM: CT CHEST, ABDOMEN, AND PELVIS WITH CONTRAST TECHNIQUE: Multidetector CT imaging of the chest, abdomen and pelvis was performed following the standard protocol during bolus administration of intravenous contrast.  CONTRAST:  100 cc Isovue 300 COMPARISON:  Multiple exams, including prior CT exams from 08/23/2014 and 11/26/1998 FINDINGS: CT CHEST FINDINGS Cardiovascular: Aberrant right subclavian artery passes behind the esophagus. Coronary, aortic arch, and branch vessel atherosclerotic vascular disease. Calcifications of the mitral valve noted. Mediastinum/Nodes: Enlarged left thyroid lobe with slightly nodular extension into the upper mediastinum. Right hilar lymph node 1.1 cm on image 25/2, stable. Left axillary lymph node 1.5 cm on image 15/2, enlarged compared to prior. Left upper breast lesion with higher density margins and lower density center, 2.1 by 2.0 cm on image 19/2. Lungs/Pleura: Mild biapical pleuroparenchymal scarring. Severe centrilobular emphysema. Airway thickening is present, suggesting bronchitis or reactive airways disease. Mild airway plugging in the left lower lobe. Musculoskeletal: Unremarkable CT ABDOMEN PELVIS FINDINGS Hepatobiliary: 1.2 by 0.7 cm hypodense lesion in segment 2 of the liver is stable from 2009 and compatible with a cyst. Gallbladder unremarkable. Pancreas: Unremarkable Spleen: Unremarkable Adrenals/Urinary Tract: Several small hypodense right renal lesions are statistically likely to be cysts but technically too small to characterize. A 2.1 by 1.8 cm fluid density lesion of the left kidney upper pole is compatible with a small cyst. Urinary bladder unremarkable. Stomach/Bowel: Sigmoid colon diverticulosis without active diverticulitis. Vascular/Lymphatic: Aortoiliac atherosclerotic vascular disease. Small porta hepatis lymph nodes are not pathologically enlarged. Reproductive: Unremarkable Other: No supplemental non-categorized findings. Musculoskeletal: Mild lumbar spondylosis and degenerative disc disease. IMPRESSION: 1. Mildly enlarged left axillary lymph node at 1.5 cm, a significant change from the prior chest CT from 2015, suspicious for left axillary metastatic disease. 2.  Left upper breast lesion 2.1 cm in diameter with higher density margins and lower density center, favoring lumpectomy site. 3. Mildly enlarged right hilar lymph node, but stable compared back through 2015. 4. No findings of osseous metastatic disease or additional metastatic sites. 5. Other  imaging findings of potential clinical significance: Aberrant right subclavian artery passes behind the esophagus. Aortic Atherosclerosis (ICD10-I70.0) and Emphysema (ICD10-J43.9). Coronary atherosclerosis. Mild nodular enlargement of the left inferior thyroid lobe, isodense to the rest of the thyroid. Airway thickening is present, suggesting bronchitis or reactive airways disease. Left hepatic lobe cyst. Left renal cyst. Likely small cysts in the right kidney. Sigmoid colon diverticulosis. Electronically Signed   By: Van Clines M.D.   On: 06/28/2017 09:57   Dg Chest Port 1 View  Result Date: 06/30/2017 CLINICAL DATA:  Port-A-Cath placement, history of newly diagnosed left breast carcinoma EXAM: PORTABLE CHEST 1 VIEW COMPARISON:  CT chest of 06/28/2017 and chest x-ray of 08/23/2014 FINDINGS: A right-sided Port-A-Cath now present. The tip overlies the upper mid SVC. No pneumothorax is seen. Mild cardiomegaly remains. IMPRESSION: Right-sided Port-A-Cath tip overlies the mid upper SVC. No pneumothorax. Electronically Signed   By: Ivar Drape M.D.   On: 06/30/2017 11:57   Dg Fluoro Guide Cv Line-no Report  Result Date: 06/30/2017 Fluoroscopy was utilized by the requesting physician.  No radiographic interpretation.     [place] Left Breast 06/08/17 IMPRESSION:  Highly suspicious palpable left breast 11 o'clock mass which measures 2.8 cm in greatest dimension. Ductal extension anteriorly to the level of the nipple, and posteriorly for additional 1.8cm, is suspicious for tumor extension.   ASSESSMENT: Lindsay [name] is a wonderful 71 y.o. female who presents today to discuss the ongoing management of her left breast  cancer.   1. Malignant neoplasm of upper-inner quadrant of left breast of female, invasive ductal carcinoma, c2T0M0, Stage IIIb, ER/PR: negative, HER2: negative, Grade 3.  -We discussed her mammogram, ultrasound findings, and initial biopsy results with patient and her family members in great detail.  -She presented with a palpable left breast mass, measures 2.8 cm on ultrasound, with left axillary lymph node involvement (not palpable on exam). Breast tumor biopsy showed triple negative breast ductal carcinoma. -She was seen by surgeon Dr. [name] who recommended left mastectomy with ALND and port placement.  -We discussed her risk of cancer recurrence after complete surgical resection. Due to the relatively large size of her primary tumor, positive lymph nodes, and aggressive nature of her triple negative breast cancer, she is at very high risk of recurrence. -Due to her positive family history of breast cancer, we recommended her to see genetics, to ruled out inheritable breast cancer syndrome.   -We discussed the benefit of chemotherapy to reduce her risk of recurrence by 40-50% -We discussed neoadjuvant versus adjuvant chemotherapy. The benefit of neoadjuvant chemotherapy to evaluate her response, sooner to start systemic therapy, and downstage her disease to make lumpectomy a possibility, etc. I strongly recommend her to consider neoadjuvant chemotherapy. I will discuss with her surgeon Dr. [name] also. She would like to proceed with this before surgery.  -I discussed the neoadjuvant chemotherapy regimens, especially with AC-T and TC.  Give stage III, triple negative disease, I suggest neoadjuvant Adriamycin and Cytoxan (AC) every 2 weeks for 4 cycles+Taxol every 2 weeks for 4 cycles, then surgery then radiation. Although she is 79, she is fit and active, would be a candidate for intensive chemotherapy. Additional carboplatin can be considered if she had has BRCA gene mutation.  -The goal of  therapy is curative.   -Her 06/28/17 CT CAP and bone scan shows no  evidence of metastasis. MRI was not done due to logistics, will be rescheduled at [place]  -She started neoadjuvant AC with onpro on  07/01/17, she experienced significant fatigue, anorexia and some diarrhea and constipation and become febrile once.  She has lost 5 pounds in the past 2 weeks. -labs reviewed, Hg dropped to 11.3 which is related to her fatigue, and her Kidney liver function are slightly elevated but adequate for treatment today. I will reduce her chemo 10% starting today due to her poor tolerance.  -I encouraged her to maintain her weight and take supplements if she cannot increase po intake.  -F/u on 11/30    2. AFIB and HTN -She is currently followed by a cardiologist for ongoing management of this. Not currently on formal anticoagulation therapy.  -She was on 55m ASA daily, but she reports that she recently stopped this in anticipation for her surgery. I encouraged her to start taking this again until her surgeon tell her to stop taking this.  -Baseline Echo adequate to start chemo  3. Genetics -I strongly encouraged her today to have genetic testing due to her strong family history of cancer. If this is positive, I informed her that her children and grandchildren would need to be tested as well. She would like to proceed with this.  -Plan to see genetics on 07/25/17  PLAN:  -Labs reviewed, adequate to proceed with second cycle AC today, dose reduce Chemo 10% due to significant fatigue, anorexia and weight loss -Nutritional consult -Lab, Flush, F/u and AC on 11/30   No orders of the defined types were placed in this encounter.  This document serves as a record of services personally performed by YTruitt Merle MD. It was created on her behalf by AJoslyn Devon a trained medical scribe. The creation of this record is based on the scribe's personal observations and the provider's statements to them.     I have reviewed the above documentation for accuracy and completeness, and I agree with the above.     FTruitt Merle MD 07/15/2017

## 2017-07-15 ENCOUNTER — Ambulatory Visit (HOSPITAL_BASED_OUTPATIENT_CLINIC_OR_DEPARTMENT_OTHER): Payer: Medicare Other | Admitting: Hematology

## 2017-07-15 ENCOUNTER — Ambulatory Visit: Payer: Medicare Other

## 2017-07-15 ENCOUNTER — Encounter: Payer: Self-pay | Admitting: Hematology

## 2017-07-15 ENCOUNTER — Other Ambulatory Visit (HOSPITAL_BASED_OUTPATIENT_CLINIC_OR_DEPARTMENT_OTHER): Payer: Medicare Other

## 2017-07-15 ENCOUNTER — Ambulatory Visit (HOSPITAL_BASED_OUTPATIENT_CLINIC_OR_DEPARTMENT_OTHER): Payer: Medicare Other

## 2017-07-15 VITALS — BP 167/69 | HR 81 | Temp 98.4°F | Resp 20 | Ht 67.0 in | Wt 269.1 lb

## 2017-07-15 VITALS — BP 157/71 | HR 68

## 2017-07-15 DIAGNOSIS — C50212 Malignant neoplasm of upper-inner quadrant of left female breast: Secondary | ICD-10-CM | POA: Diagnosis not present

## 2017-07-15 DIAGNOSIS — Z5111 Encounter for antineoplastic chemotherapy: Secondary | ICD-10-CM

## 2017-07-15 DIAGNOSIS — Z171 Estrogen receptor negative status [ER-]: Principal | ICD-10-CM

## 2017-07-15 DIAGNOSIS — Z95828 Presence of other vascular implants and grafts: Secondary | ICD-10-CM

## 2017-07-15 DIAGNOSIS — I4891 Unspecified atrial fibrillation: Secondary | ICD-10-CM

## 2017-07-15 DIAGNOSIS — C773 Secondary and unspecified malignant neoplasm of axilla and upper limb lymph nodes: Secondary | ICD-10-CM

## 2017-07-15 DIAGNOSIS — I10 Essential (primary) hypertension: Secondary | ICD-10-CM | POA: Diagnosis not present

## 2017-07-15 LAB — CBC WITH DIFFERENTIAL/PLATELET
BASO%: 0.3 % (ref 0.0–2.0)
Basophils Absolute: 0 10*3/uL (ref 0.0–0.1)
EOS%: 0.1 % (ref 0.0–7.0)
Eosinophils Absolute: 0 10*3/uL (ref 0.0–0.5)
HEMATOCRIT: 34.3 % — AB (ref 34.8–46.6)
HEMOGLOBIN: 11.3 g/dL — AB (ref 11.6–15.9)
LYMPH#: 1.2 10*3/uL (ref 0.9–3.3)
LYMPH%: 17.7 % (ref 14.0–49.7)
MCH: 28.4 pg (ref 25.1–34.0)
MCHC: 32.8 g/dL (ref 31.5–36.0)
MCV: 86.4 fL (ref 79.5–101.0)
MONO#: 1.6 10*3/uL — ABNORMAL HIGH (ref 0.1–0.9)
MONO%: 23.3 % — ABNORMAL HIGH (ref 0.0–14.0)
NEUT%: 58.6 % (ref 38.4–76.8)
NEUTROS ABS: 4 10*3/uL (ref 1.5–6.5)
Platelets: 354 10*3/uL (ref 145–400)
RBC: 3.97 10*6/uL (ref 3.70–5.45)
RDW: 13.5 % (ref 11.2–14.5)
WBC: 6.8 10*3/uL (ref 3.9–10.3)

## 2017-07-15 LAB — COMPREHENSIVE METABOLIC PANEL
ALBUMIN: 2.9 g/dL — AB (ref 3.5–5.0)
ALK PHOS: 49 U/L (ref 40–150)
ALT: 62 U/L — ABNORMAL HIGH (ref 0–55)
ANION GAP: 12 meq/L — AB (ref 3–11)
AST: 46 U/L — ABNORMAL HIGH (ref 5–34)
BILIRUBIN TOTAL: 0.42 mg/dL (ref 0.20–1.20)
BUN: 19.7 mg/dL (ref 7.0–26.0)
CO2: 27 mEq/L (ref 22–29)
Calcium: 9 mg/dL (ref 8.4–10.4)
Chloride: 98 mEq/L (ref 98–109)
Creatinine: 0.9 mg/dL (ref 0.6–1.1)
EGFR: 60 mL/min/{1.73_m2} — AB (ref >60)
Glucose: 110 mg/dl (ref 70–140)
Potassium: 3.6 mEq/L (ref 3.5–5.1)
Sodium: 137 mEq/L (ref 136–145)
TOTAL PROTEIN: 6.6 g/dL (ref 6.4–8.3)

## 2017-07-15 MED ORDER — DOXORUBICIN HCL CHEMO IV INJECTION 2 MG/ML
54.0000 mg/m2 | Freq: Once | INTRAVENOUS | Status: AC
  Administered 2017-07-15: 130 mg via INTRAVENOUS
  Filled 2017-07-15: qty 65

## 2017-07-15 MED ORDER — SODIUM CHLORIDE 0.9 % IV SOLN
540.0000 mg/m2 | Freq: Once | INTRAVENOUS | Status: AC
  Administered 2017-07-15: 1300 mg via INTRAVENOUS
  Filled 2017-07-15: qty 65

## 2017-07-15 MED ORDER — SODIUM CHLORIDE 0.9% FLUSH
10.0000 mL | INTRAVENOUS | Status: DC | PRN
  Administered 2017-07-15: 10 mL
  Filled 2017-07-15: qty 10

## 2017-07-15 MED ORDER — SODIUM CHLORIDE 0.9 % IV SOLN
Freq: Once | INTRAVENOUS | Status: AC
  Administered 2017-07-15: 13:00:00 via INTRAVENOUS

## 2017-07-15 MED ORDER — HEPARIN SOD (PORK) LOCK FLUSH 100 UNIT/ML IV SOLN
500.0000 [IU] | Freq: Once | INTRAVENOUS | Status: AC | PRN
  Administered 2017-07-15: 500 [IU]
  Filled 2017-07-15: qty 5

## 2017-07-15 MED ORDER — SODIUM CHLORIDE 0.9 % IV SOLN
Freq: Once | INTRAVENOUS | Status: AC
  Administered 2017-07-15: 13:00:00 via INTRAVENOUS
  Filled 2017-07-15: qty 5

## 2017-07-15 MED ORDER — PALONOSETRON HCL INJECTION 0.25 MG/5ML
INTRAVENOUS | Status: AC
  Filled 2017-07-15: qty 5

## 2017-07-15 MED ORDER — PALONOSETRON HCL INJECTION 0.25 MG/5ML
0.2500 mg | Freq: Once | INTRAVENOUS | Status: AC
Start: 2017-07-15 — End: 2017-07-15
  Administered 2017-07-15: 0.25 mg via INTRAVENOUS

## 2017-07-15 MED ORDER — PEGFILGRASTIM 6 MG/0.6ML ~~LOC~~ PSKT
6.0000 mg | PREFILLED_SYRINGE | Freq: Once | SUBCUTANEOUS | Status: AC
  Administered 2017-07-15: 6 mg via SUBCUTANEOUS
  Filled 2017-07-15: qty 0.6

## 2017-07-15 MED ORDER — SODIUM CHLORIDE 0.9% FLUSH
10.0000 mL | INTRAVENOUS | Status: DC | PRN
  Administered 2017-07-15: 10 mL via INTRAVENOUS
  Filled 2017-07-15: qty 10

## 2017-07-15 NOTE — Patient Instructions (Signed)
[facility] Discharge Instructions for Patients Receiving Chemotherapy  Today you received the following chemotherapy agents Adriamycin; Cytoxan  To help prevent nausea and vomiting after your treatment, we encourage you to take your nausea medication as directed   If you develop nausea and vomiting that is not controlled by your nausea medication, call the clinic.   BELOW ARE SYMPTOMS THAT SHOULD BE REPORTED IMMEDIATELY:  *FEVER GREATER THAN 100.5 F  *CHILLS WITH OR WITHOUT FEVER  NAUSEA AND VOMITING THAT IS NOT CONTROLLED WITH YOUR NAUSEA MEDICATION  *UNUSUAL SHORTNESS OF BREATH  *UNUSUAL BRUISING OR BLEEDING  TENDERNESS IN MOUTH AND THROAT WITH OR WITHOUT PRESENCE OF ULCERS  *URINARY PROBLEMS  *BOWEL PROBLEMS  UNUSUAL RASH Items with * indicate a potential emergency and should be followed up as soon as possible.  Feel free to call the clinic should you have any questions or concerns. The clinic phone number is [phone].  Please show the [facility] at check-in to the Emergency Department and triage nurse.

## 2017-07-15 NOTE — Progress Notes (Signed)
1436: Blood return noted before, every 3 cc during and after Adriamycin push.

## 2017-07-17 ENCOUNTER — Telehealth: Payer: Self-pay

## 2017-07-17 NOTE — Telephone Encounter (Signed)
Spoke with patient and she is aware of her her NUT appt  Lindsay [name]

## 2017-07-18 ENCOUNTER — Other Ambulatory Visit: Payer: Self-pay | Admitting: Hematology

## 2017-07-19 ENCOUNTER — Telehealth: Payer: Self-pay | Admitting: *Deleted

## 2017-07-19 NOTE — Telephone Encounter (Signed)
Raford Pitcher, could you see her in infusion room on 11/30? If not, please cancel her appointment with you in the morning. Thanks   Truitt Merle MD

## 2017-07-19 NOTE — Telephone Encounter (Signed)
"  Someone called me Sunday with an appointment with a dietician at [facility] 07-29-2017.n  I'm reading two books on nutrition.  Cancel this appointment.  I am not coming in at 9:45 am staying until 4:00 pm."  Consult requestd after 07-15-2017 visit.  Will notify provider.

## 2017-07-22 ENCOUNTER — Telehealth: Payer: Self-pay | Admitting: Hematology

## 2017-07-22 NOTE — Telephone Encounter (Signed)
No 11/16 los

## 2017-07-23 DIAGNOSIS — K5792 Diverticulitis of intestine, part unspecified, without perforation or abscess without bleeding: Secondary | ICD-10-CM | POA: Diagnosis not present

## 2017-07-23 DIAGNOSIS — K5732 Diverticulitis of large intestine without perforation or abscess without bleeding: Secondary | ICD-10-CM | POA: Diagnosis not present

## 2017-07-23 DIAGNOSIS — R197 Diarrhea, unspecified: Secondary | ICD-10-CM | POA: Diagnosis not present

## 2017-07-23 DIAGNOSIS — I4891 Unspecified atrial fibrillation: Secondary | ICD-10-CM | POA: Diagnosis not present

## 2017-07-23 DIAGNOSIS — Z79899 Other long term (current) drug therapy: Secondary | ICD-10-CM | POA: Diagnosis not present

## 2017-07-23 DIAGNOSIS — N179 Acute kidney failure, unspecified: Secondary | ICD-10-CM | POA: Diagnosis not present

## 2017-07-23 DIAGNOSIS — Z87891 Personal history of nicotine dependence: Secondary | ICD-10-CM | POA: Diagnosis not present

## 2017-07-23 DIAGNOSIS — D709 Neutropenia, unspecified: Secondary | ICD-10-CM | POA: Diagnosis not present

## 2017-07-23 DIAGNOSIS — C50919 Malignant neoplasm of unspecified site of unspecified female breast: Secondary | ICD-10-CM | POA: Diagnosis not present

## 2017-07-23 DIAGNOSIS — J449 Chronic obstructive pulmonary disease, unspecified: Secondary | ICD-10-CM | POA: Diagnosis not present

## 2017-07-23 DIAGNOSIS — I10 Essential (primary) hypertension: Secondary | ICD-10-CM | POA: Diagnosis not present

## 2017-07-23 DIAGNOSIS — Z7982 Long term (current) use of aspirin: Secondary | ICD-10-CM | POA: Diagnosis not present

## 2017-07-23 DIAGNOSIS — J441 Chronic obstructive pulmonary disease with (acute) exacerbation: Secondary | ICD-10-CM | POA: Diagnosis not present

## 2017-07-23 DIAGNOSIS — R1110 Vomiting, unspecified: Secondary | ICD-10-CM | POA: Diagnosis not present

## 2017-07-23 DIAGNOSIS — E785 Hyperlipidemia, unspecified: Secondary | ICD-10-CM | POA: Diagnosis not present

## 2017-07-23 DIAGNOSIS — R5081 Fever presenting with conditions classified elsewhere: Secondary | ICD-10-CM | POA: Diagnosis not present

## 2017-07-25 ENCOUNTER — Encounter: Payer: Self-pay | Admitting: Genetic Counselor

## 2017-07-25 ENCOUNTER — Other Ambulatory Visit: Payer: Medicare Other

## 2017-07-25 ENCOUNTER — Encounter: Payer: Medicare Other | Admitting: Genetic Counselor

## 2017-07-28 ENCOUNTER — Telehealth: Payer: Self-pay | Admitting: *Deleted

## 2017-07-28 NOTE — Telephone Encounter (Signed)
Lindsay [name] left a message stating the medicine she took in [place] is making her sick, "I am spending most of my time in the bathroom, my ankles are swollen big. I am supposed to come for chemo tomorrow. Do I need to stop the medicine ?"

## 2017-07-28 NOTE — Telephone Encounter (Signed)
I called back and spoke with Lindsay [name].  She has stopped the antibiotics yesterday, and her symptoms has resolved.  She feels well today.  She plans to come in tomorrow for follow-up and chemo.  Questions were answered.  Truitt Merle MD

## 2017-07-29 ENCOUNTER — Telehealth: Payer: Self-pay | Admitting: Hematology

## 2017-07-29 ENCOUNTER — Encounter: Payer: Medicare Other | Admitting: Nutrition

## 2017-07-29 ENCOUNTER — Other Ambulatory Visit (HOSPITAL_COMMUNITY)
Admission: AD | Admit: 2017-07-29 | Discharge: 2017-07-29 | Disposition: A | Discharge status: Home / Self Care | Payer: Medicare Other | Source: Ambulatory Visit | Attending: Nurse Practitioner | Admitting: Nurse Practitioner

## 2017-07-29 ENCOUNTER — Encounter: Payer: Self-pay | Admitting: Nurse Practitioner

## 2017-07-29 ENCOUNTER — Other Ambulatory Visit (HOSPITAL_BASED_OUTPATIENT_CLINIC_OR_DEPARTMENT_OTHER): Payer: Medicare Other

## 2017-07-29 ENCOUNTER — Ambulatory Visit: Payer: Medicare Other

## 2017-07-29 ENCOUNTER — Ambulatory Visit (HOSPITAL_BASED_OUTPATIENT_CLINIC_OR_DEPARTMENT_OTHER): Payer: Medicare Other | Admitting: Nurse Practitioner

## 2017-07-29 ENCOUNTER — Ambulatory Visit (HOSPITAL_BASED_OUTPATIENT_CLINIC_OR_DEPARTMENT_OTHER): Payer: Medicare Other

## 2017-07-29 VITALS — BP 210/70 | HR 78 | Temp 98.5°F | Resp 20 | Wt 265.6 lb

## 2017-07-29 DIAGNOSIS — Z171 Estrogen receptor negative status [ER-]: Secondary | ICD-10-CM | POA: Diagnosis not present

## 2017-07-29 DIAGNOSIS — D72829 Elevated white blood cell count, unspecified: Secondary | ICD-10-CM | POA: Diagnosis not present

## 2017-07-29 DIAGNOSIS — Z452 Encounter for adjustment and management of vascular access device: Secondary | ICD-10-CM

## 2017-07-29 DIAGNOSIS — R197 Diarrhea, unspecified: Secondary | ICD-10-CM | POA: Diagnosis not present

## 2017-07-29 DIAGNOSIS — I4891 Unspecified atrial fibrillation: Secondary | ICD-10-CM | POA: Diagnosis not present

## 2017-07-29 DIAGNOSIS — C50212 Malignant neoplasm of upper-inner quadrant of left female breast: Secondary | ICD-10-CM

## 2017-07-29 DIAGNOSIS — Z95828 Presence of other vascular implants and grafts: Secondary | ICD-10-CM

## 2017-07-29 DIAGNOSIS — I10 Essential (primary) hypertension: Secondary | ICD-10-CM

## 2017-07-29 LAB — COMPREHENSIVE METABOLIC PANEL
ALBUMIN: 3.1 g/dL — AB (ref 3.5–5.0)
ALK PHOS: 94 U/L (ref 40–150)
ALT: 39 U/L (ref 0–55)
AST: 39 U/L — ABNORMAL HIGH (ref 5–34)
Anion Gap: 11 mEq/L (ref 3–11)
BILIRUBIN TOTAL: 0.35 mg/dL (ref 0.20–1.20)
BUN: 11.7 mg/dL (ref 7.0–26.0)
CO2: 24 mEq/L (ref 22–29)
Calcium: 8.5 mg/dL (ref 8.4–10.4)
Chloride: 105 mEq/L (ref 98–109)
Creatinine: 0.9 mg/dL (ref 0.6–1.1)
GLUCOSE: 101 mg/dL (ref 70–140)
Potassium: 3.6 mEq/L (ref 3.5–5.1)
SODIUM: 140 meq/L (ref 136–145)
TOTAL PROTEIN: 6.2 g/dL — AB (ref 6.4–8.3)

## 2017-07-29 LAB — CBC WITH DIFFERENTIAL/PLATELET
BASO%: 1.6 % (ref 0.0–2.0)
Basophils Absolute: 0.7 10*3/uL — ABNORMAL HIGH (ref 0.0–0.1)
EOS%: 1.3 % (ref 0.0–7.0)
Eosinophils Absolute: 0.6 10*3/uL — ABNORMAL HIGH (ref 0.0–0.5)
HCT: 30.7 % — ABNORMAL LOW (ref 34.8–46.6)
HEMOGLOBIN: 10.3 g/dL — AB (ref 11.6–15.9)
LYMPH#: 3.1 10*3/uL (ref 0.9–3.3)
LYMPH%: 6.9 % — AB (ref 14.0–49.7)
MCH: 28.6 pg (ref 25.1–34.0)
MCHC: 33.6 g/dL (ref 31.5–36.0)
MCV: 85.3 fL (ref 79.5–101.0)
MONO#: 4 10*3/uL — ABNORMAL HIGH (ref 0.1–0.9)
MONO%: 8.9 % (ref 0.0–14.0)
NEUT#: 36.7 10*3/uL — ABNORMAL HIGH (ref 1.5–6.5)
NEUT%: 81.3 % — AB (ref 38.4–76.8)
Platelets: 159 10*3/uL (ref 145–400)
RBC: 3.6 10*6/uL — AB (ref 3.70–5.45)
RDW: 13.8 % (ref 11.2–14.5)
WBC: 45.2 10*3/uL — ABNORMAL HIGH (ref 3.9–10.3)
nRBC: 1 % — ABNORMAL HIGH (ref 0–0)

## 2017-07-29 MED ORDER — SODIUM CHLORIDE 0.9% FLUSH
10.0000 mL | INTRAVENOUS | Status: AC | PRN
Start: 2017-07-29 — End: ?
  Administered 2017-07-29: 10 mL via INTRAVENOUS
  Filled 2017-07-29: qty 10

## 2017-07-29 MED ORDER — ALTEPLASE 2 MG IJ SOLR
2.0000 mg | Freq: Once | INTRAMUSCULAR | Status: AC
  Administered 2017-07-29: 2 mg
  Filled 2017-07-29: qty 2

## 2017-07-29 MED ORDER — ALTEPLASE 2 MG IJ SOLR
2.0000 mg | Freq: Once | INTRAMUSCULAR | Status: AC | PRN
  Administered 2017-07-29: 2 mg
  Filled 2017-07-29: qty 2

## 2017-07-29 MED ORDER — DIPHENOXYLATE-ATROPINE 2.5-0.025 MG PO TABS: 1.0000 | ORAL_TABLET | Freq: Once | ORAL | Status: DC

## 2017-07-29 NOTE — Patient Instructions (Signed)
Implanted Port Home Guide An implanted port is a type of central line that is placed under the skin. Central lines are used to provide IV access when treatment or nutrition needs to be given through a person's veins. Implanted ports are used for long-term IV access. An implanted port may be placed because:  You need IV medicine that would be irritating to the small veins in your hands or arms.  You need long-term IV medicines, such as antibiotics.  You need IV nutrition for a long period.  You need frequent blood draws for lab tests.  You need dialysis.  Implanted ports are usually placed in the chest area, but they can also be placed in the upper arm, the abdomen, or the leg. An implanted port has two main parts:  Reservoir. The reservoir is round and will appear as a small, raised area under your skin. The reservoir is the part where a needle is inserted to give medicines or draw blood.  Catheter. The catheter is a thin, flexible tube that extends from the reservoir. The catheter is placed into a large vein. Medicine that is inserted into the reservoir goes into the catheter and then into the vein.  How will I care for my incision site? Do not get the incision site wet. Bathe or shower as directed by your health care provider. How is my port accessed? Special steps must be taken to access the port:  Before the port is accessed, a numbing cream can be placed on the skin. This helps numb the skin over the port site.  Your health care provider uses a sterile technique to access the port. ? Your health care provider must put on a mask and sterile gloves. ? The skin over your port is cleaned carefully with an antiseptic and allowed to dry. ? The port is gently pinched between sterile gloves, and a needle is inserted into the port.  Only "non-coring" port needles should be used to access the port. Once the port is accessed, a blood return should be checked. This helps ensure that the port  is in the vein and is not clogged.  If your port needs to remain accessed for a constant infusion, a clear (transparent) bandage will be placed over the needle site. The bandage and needle will need to be changed every week, or as directed by your health care provider.  Keep the bandage covering the needle clean and dry. Do not get it wet. Follow your health care provider's instructions on how to take a shower or bath while the port is accessed.  If your port does not need to stay accessed, no bandage is needed over the port.  What is flushing? Flushing helps keep the port from getting clogged. Follow your health care provider's instructions on how and when to flush the port. Ports are usually flushed with saline solution or a medicine called heparin. The need for flushing will depend on how the port is used.  If the port is used for intermittent medicines or blood draws, the port will need to be flushed: ? After medicines have been given. ? After blood has been drawn. ? As part of routine maintenance.  If a constant infusion is running, the port may not need to be flushed.  How long will my port stay implanted? The port can stay in for as long as your health care provider thinks it is needed. When it is time for the port to come out, surgery will be   done to remove it. The procedure is similar to the one performed when the port was put in. When should I seek immediate medical care? When you have an implanted port, you should seek immediate medical care if:  You notice a bad smell coming from the incision site.  You have swelling, redness, or drainage at the incision site.  You have more swelling or pain at the port site or the surrounding area.  You have a fever that is not controlled with medicine.  This information is not intended to replace advice given to you by your health care provider. Make sure you discuss any questions you have with your health care provider. Document  Released: 08/16/2005 Document Revised: 01/22/2016 Document Reviewed: 04/23/2013 Elsevier Interactive Patient Education  2017 [employer]  

## 2017-07-29 NOTE — Telephone Encounter (Signed)
Gave avs and calendar for December  °

## 2017-07-29 NOTE — Progress Notes (Addendum)
[place]  [phone] [phone]  Clinic Follow up Note   Patient Care Team: Ronita Hipps, MD as PCP - General (Family Medicine) Coralie Keens, MD as Consulting Physician (General Surgery) Richardo Priest, MD as Consulting Physician (Cardiology) 07/29/2017  CHIEF COMPLAINT: F/u left breast cancer   SUMMARY OF ONCOLOGIC HISTORY: Oncology History   Cancer Staging Malignant neoplasm of upper-inner quadrant of left breast in female, estrogen receptor negative ([place]) Staging form: Breast, AJCC 8th Edition - Clinical stage from 06/10/2017: Stage IIIB (cT2, cN1, cM0, G3, ER: Negative, PR: Negative, HER2: Negative) - Signed by Truitt Merle, MD on 06/21/2017       Malignant neoplasm of upper-inner quadrant of left breast in female, estrogen receptor negative ([place])   06/08/2017 Imaging    DIAGNOSTIC MAMMO and [place] Left Breast 06/08/17 IMPRESSION:  Highly suspicious palpable left breast 11 o'clock mass which measures 2.8 cm in greatest dimension. Ductal extension anteriorly to the level of the nipple, and posteriorly for additional 1.8cm, is suspicious for tumor extension.       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 Wall thickness was   increased in a pattern of moderate LVH. Systolic function was  normal. The estimated ejection fraction was in the range of 60%   to 65%. Wall motion was normal; there were no regional wall   motion abnormalities. Left ventricular diastolic function   parameters were normal. - Mitral valve: Calcified annulus. Mildly thickened leaflets . - Left atrium: The atrium was mildly dilated. - Atrial septum: There was increased thickness of the septum,   consistent with lipomatous hypertrophy. No defect or patent   foramen ovale was identified. - Impressions: Normal GLS - 18.6 Impressions: - Normal GLS - 18.6      06/28/2017 Imaging    CT CAP W Contrast 06/28/17 IMPRESSION: 1. Mildly enlarged left axillary lymph node at 1.5 cm, a significant change from the prior chest CT from 2015, suspicious for left axillary metastatic disease. 2. Left upper breast lesion 2.1 cm in diameter with higher density margins and lower density center, favoring lumpectomy site. 3. Mildly enlarged right hilar lymph node, but stable compared back through 2015. 4. No findings of osseous metastatic disease or additional metastatic sites. 5. Other imaging findings of potential clinical significance: Aberrant right subclavian artery passes behind the esophagus. Aortic Atherosclerosis (ICD10-I70.0) and Emphysema (ICD10-J43.9). Coronary atherosclerosis. Mild nodular enlargement of the left inferior thyroid lobe, isodense to the rest of the thyroid. Airway thickening is present, suggesting bronchitis or reactive airways disease. Left hepatic lobe cyst. Left renal cyst. Likely small cysts in the right kidney. Sigmoid colon diverticulosis.       06/28/2017 Imaging    Bone Scan 06/28/17 IMPRESSION: No evidence of osseous metastatic disease. Subtle increased radiotracer uptake within the left chest wall, likely within the left  breast.      06/30/2017 Surgery    Port placement by Dr. [name] on 06/30/17      07/01/2017 -  Chemotherapy    Neoadjuvant Adriamycin and Cytoxan  [facility]) every 2 weeks for 4 cycles starting 07/01/17, followed byTaxol every 2 weeks for 4 cycles        07/23/2017 - 07/26/2017 Hospital Admission    Admit date: 07/23/17 Admission diagnosis: 07/26/17 Additional comments: febrile neutropenia; diverticulitis     CURRENT THERAPY: Neoadjuvant Adriamycin and Cytoxan (AC) every 2 weeks for 4 cycles starting 07/01/17, followed byTaxol every 2 weeks for 4 cycles, reduced 10% due to poor toleration starting with cycle 2.   INTERVAL HISTORY: Ms. Altmann returns for f/u as scheduled prior to cycle 3 AC; she was last treated on 11/16. On 11/23 she developed lower abdominal pain, fever of 101.?, nausea, and vomiting. She was admitted to [facility] with febrile neutropenia and diverticulitis. She was treated inpatient with cefepime and discharged on 07/26/17 on flagyl and levaquin. She developed non itching skin rash on 11/28, scattered over arms, chest, and groin with skin peeling to fingertips. She discontinued antibiotics on 11/29. Today her abdominal pain, nausea, and vomiting are resolved. She has loose BM 3x per day, has not taken imodium. Denies fever, chills, cough, chest pain, or bone pain with neulasta. Mild dyspnea on exertion stable, resolves quickly with rest. Her BP is elevated today, PAC was not giving blood return and she is stressed about that. She otherwise tolerated cycle 2 well, mild fatigue 1-2 days after treatment that is now resolved.   REVIEW OF SYSTEMS:   Constitutional: Denies fevers, chills (+) 4 lbs weight loss; she feels she is eating well and recently cancelled nutrition apt; Dr. Burr Medico asked Ernestene Kiel to see patient in infusion  Eyes: Denies blurriness of vision Ears, nose, mouth, throat, and face: Denies mucositis or sore throat Respiratory: Denies cough or wheezes (+) dyspnea on exertion, resolves quickly with rest Cardiovascular: Denies palpitation, chest discomfort or lower extremity swelling Gastrointestinal:  Denies  nausea, vomiting, constipation, heartburn, or bleeding (+) frequent loose BM, 3 per day; recent antibiotic use Skin: (+) abnormal skin rash after antibiotic use (cefepime, levaquin, flagyl); non itching; to arms, chest, and groin  Lymphatics: Denies new lymphadenopathy or easy bruising  Neurological:Denies numbness, tingling or new weaknesses Behavioral/Psych: Mood is stable, no new changes  All other systems were reviewed with the patient and are negative.  MEDICAL HISTORY:  Past Medical History:  Diagnosis Date  . Cancer [facility])    left breast cancer  . Dysrhythmia    PACs and nonsustained runs of atrial tach by Holter 10/13/16  . History of kidney stones   . Hypertension     SURGICAL HISTORY: Past Surgical History:  Procedure Laterality Date  . COLONOSCOPY    . FOOT SURGERY Right   . left breast lumpectomy  2000  . PORTACATH PLACEMENT Right 06/30/2017   Procedure: [place];  Surgeon: Coralie Keens, MD;  Location: [place];  Service: General;  Laterality: Right;    I have reviewed the social history and family history with the patient and they are unchanged from previous note.  ALLERGIES:  has No Known Allergies.  MEDICATIONS:  Current Outpatient Medications  Medication Sig Dispense Refill  . acebutolol (SECTRAL) 200 MG capsule Take 200 mg by mouth 2 (two) times daily.    Marland Kitchen aspirin EC 81 MG tablet Take 81 mg by mouth daily.    Marland Kitchen  atorvastatin (LIPITOR) 10 MG tablet Take 10 mg by mouth at bedtime.    . calcium carbonate (OS-CAL) 600 MG TABS tablet Take 1,200 mg by mouth daily.    . Cholecalciferol (VITAMIN D3) 2000 units TABS Take 2,000 Units by mouth daily.     . cloNIDine (CATAPRES) 0.1 MG tablet Take 0.1 mg by mouth 2 (two) times daily.    . DOCOSAHEXAENOIC ACID PO Take 1,200 mg by mouth 2 (two) times daily.    Marland Kitchen escitalopram (LEXAPRO) 5 MG tablet Take 5 mg by mouth daily.    . hydrALAZINE (APRESOLINE) 25 MG tablet Take 12.5 mg by mouth 3 (three)  times daily.    Marland Kitchen lidocaine-prilocaine (EMLA) cream Apply 1 application topically as needed. 30 g 2  . loratadine (CLARITIN) 10 MG tablet Take 10 mg by mouth daily.    Marland Kitchen LORazepam (ATIVAN) 1 MG tablet Take 1 tablet (1 mg total) by mouth once as needed for anxiety. Take 1 tab 1 hour before MRI scan 2 tablet 0  . magnesium oxide (MAG-OX) 400 MG tablet Take 400 mg by mouth daily.    . Multiple Vitamins-Minerals (EYE VITAMINS PO) Take 1 capsule by mouth 2 (two) times daily.    Marland Kitchen olmesartan-hydrochlorothiazide (BENICAR HCT) 40-25 MG tablet Take 1 tablet by mouth daily.  1  . vitamin B-12 (CYANOCOBALAMIN) 1000 MCG tablet Take 1 tablet by mouth daily.    . ondansetron (ZOFRAN) 8 MG tablet Take 1 tablet (8 mg total) by mouth every 8 (eight) hours as needed for nausea or vomiting. (Patient not taking: Reported on 07/29/2017) 20 tablet 2  . prochlorperazine (COMPAZINE) 10 MG tablet Take 1 tablet (10 mg total) by mouth every 6 (six) hours as needed for nausea or vomiting. (Patient not taking: Reported on 07/29/2017) 30 tablet 2   No current facility-administered medications for this visit.    Facility-Administered Medications Ordered in Other Visits  Medication Dose Route Frequency Provider Last Rate Last Dose  . sodium chloride flush (NS) 0.9 % injection 10 mL  10 mL Intravenous PRN Truitt Merle, MD   10 mL at 07/29/17 1310    PHYSICAL EXAMINATION: ECOG PERFORMANCE STATUS: 2 - Symptomatic, <50% confined to bed  Vitals:   07/29/17 1405 07/29/17 1414  BP:  (!) 210/70  Pulse: 78   Resp: 20   Temp: 98.5 F (36.9 C)   SpO2: 97%    Filed Weights   07/29/17 1405  Weight: 265 lb 9.6 oz (120.5 kg)    GENERAL:alert, no distress and comfortable SKIN: skin color, texture, turgor are normal, (+) scattered erythematous skin rash to bilateral arms and upper chest EYES: normal, Conjunctiva are pink and non-injected, sclera clear OROPHARYNX:no exudate, no erythema and lips, buccal mucosa, and tongue normal    NECK: supple, thyroid normal size, non-tender, without nodularity LYMPH:  no palpable cervical, supraclavicular, or axillary lymphadenopathy  LUNGS: clear to auscultation bilaterally with normal breathing effort HEART: regular rate & rhythm and no murmurs and no lower extremity edema ABDOMEN:abdomen soft, non-tender and normal bowel sounds Musculoskeletal:no cyanosis of digits and no clubbing  NEURO: alert & oriented x 3 with fluent speech, no focal motor/sensory deficits BREASTS: previous breast mass 2.5 x 1.5 cm at 11 o'clock to 1 o'clock region of the left breast is not palpable on today's exam. (+) left nipple inversion.   PAC without erythema  LABORATORY DATA:  I have reviewed the data as listed CBC Latest Ref Rng & Units 07/29/2017 07/15/2017 07/01/2017  WBC  3.9 - 10.3 10e3/uL 45.2(H) 6.8 15.0(H)  Hemoglobin 11.6 - 15.9 g/dL 10.3(L) 11.3(L) 13.1  Hematocrit 34.8 - 46.6 % 30.7(L) 34.3(L) 40.8  Platelets 145 - 400 10e3/uL 159 354 226    CMP Latest Ref Rng & Units 07/29/2017 07/15/2017 07/01/2017  Glucose 70 - 140 mg/dl 101 110 89  BUN 7.0 - 26.0 mg/dL 11.7 19.7 16.4  Creatinine 0.6 - 1.1 mg/dL 0.9 0.9 0.9  Sodium 136 - 145 mEq/L 140 137 139  Potassium 3.5 - 5.1 mEq/L 3.6 3.6 3.9  Chloride 101 - 111 mmol/L - - -  CO2 22 - 29 mEq/L '24 27 27  ' Calcium 8.4 - 10.4 mg/dL 8.5 9.0 9.4  Total Protein 6.4 - 8.3 g/dL 6.2(L) 6.6 6.9  Total Bilirubin 0.20 - 1.20 mg/dL 0.35 0.42 0.50  Alkaline Phos 40 - 150 U/L 94 49 44  AST 5 - 34 U/L 39(H) 46(H) 19  ALT 0 - 55 U/L 39 62(H) 16    RADIOGRAPHIC STUDIES: I have personally reviewed the radiological images as listed and agreed with the findings in the report. No results found.   ASSESSMENT & PLAN: Lindsay [name] is a wonderful 71 y.o. female who presents today to discuss the ongoing management of her left breast cancer.   1. 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  2. Afib, HTN 3. Genetics 4. Diverticulitis requiring hospitalization and IV antibiotics  5. Skin rash  Ms. Petzold appears stable today. She was hospitalized 11/24 - 11/28 for febrile neutropenia and diverticulitis. She abruptly discontinued outpatient antibiotic therapy due to skin eruption, likely drug rash secondary to antibiotics. She can use benadryl PRN for itching and topical hydrocortisone to reduce inflammation. She will let us know which antibiotics she took at home so we can update allergy list. Abdominal pain is resolved. She continues to have frequent loose stool, likely secondary to antibiotic. She will get 1 dose lomotil in infusion and take imodium as needed at home. She received neulasta on 11/16 with cycle 2 and received possibly 2 doses neupogen during hospitalization. She has marked leukocytosis today, WBC 45.2, ANC 36.7; likely due to growth factor. Will obtain blood culture to rule out bacteremia secondary to diverticulitis. Cmet with mildly elevated AST, 39; improved from 46 and otherwise unremarkable.   Previously palpable 2.5 x 1.5 cm left breast mass in 11 o'clock to 1'oclock position is not palpable on today's exam, likely indicating a good response to therapy. She will proceed with cycle 3 AC with Onpro today at current dose (cytoxan 540 mg/m2, adriamycin 54 mg/m2). After completion of cycle 4 she will proceed with taxol q2 weeks x4 treatment. I encouraged her to continue to eat well and remain active while on treatment.   BP markedly elevated today, 210/70. She reports stressful visit today due to [facility] needing cathflo and walking around a lot throughout [facility]. She took hydralazine during f/u today. Will recheck BP in infusion room prior to discharge.   PLAN: -Lomotil x1 in infusion for frequent loose stools; imodium PRN -Blood culture to rule out bacteremia  -Benadryl and hydrocortisone for skin eruption -Labs reviewed, marked  leukocytosis and neutrophilia secondary to neulasta and neupogen injections; OK to proceed with cycle 3 AC with Onpro -I spoke with infusion RN, will recheck BP in infusion room  Orders Placed This Encounter  Procedures  . Culture, Blood    X1; please draw either  from [facility] or peripherally if PAC not giving blood return.    Standing Status:   Future    Number of Occurrences:   1    Standing Expiration Date:   07/29/2018   All questions were answered. The patient knows to call the clinic with any problems, questions or concerns. No barriers to learning was detected.     Alla Feeling, NP 07/29/17   Addendum: No blood return noted to [facility] after tpa instillation x2 doses per infusion RN. Patient refused peripheral chemo administration. Blood culture drawn peripherally. Will reschedule chemo asap next week. Will arrange IR dye study to check PAC.  BP 199/84 at time of discharge.   Addendum   I have seen the patient, examined her. I agree with the assessment and and plan and have edited the notes.   Ms Sl[place] was hospitalized for neutropenic fever and diverticulitis after last cycle chemotherapy.  She developed skin rash after hospital discharge, likely related to her oral antibiotics Levaquin and or Flagyl.  She has significant leukocytosis, likely related to her recent Neupogen she received in the hospital.  No clinical suspicion for sepsis or infection.  Okay to proceed chemotherapy today, however due to port dysfunction, chemo was not able given today and will be rescheduled next week.  Truitt Merle  07/29/2017

## 2017-07-29 NOTE — Progress Notes (Signed)
Patient presented to treatment area with [facility] needle in place. Unable to obtain a blood return. Deaccessed/reaccessed without difficulty. No blood return. Cath-Flo repeated with no positive results. IR called and arrangements made for dye study next week. We will reschedule chemo at that time. Patient remained hypertensive at time of discharge. Denies CP, dyspnea, HA, and/or visual disturbances. Marijean Niemann, NP aware. Advised to have patient take home meds when she gets home. Patient aware and verbalized understanding.

## 2017-07-29 NOTE — Progress Notes (Signed)
OK to treat today with WBC count per Natale Milch NP. Will draw one blood culture from [facility] if blood return is sufficient.

## 2017-07-29 NOTE — Progress Notes (Signed)
Port-A-Cath  Accessed per Marzetta Board, RN, site flushes well with no swelling or pain noted on flushing with NS. Cath-Flo instilled per policy. Will monitor for blood return. Site is marked

## 2017-08-01 ENCOUNTER — Other Ambulatory Visit: Payer: Self-pay | Admitting: Nurse Practitioner

## 2017-08-01 DIAGNOSIS — Z452 Encounter for adjustment and management of vascular access device: Secondary | ICD-10-CM

## 2017-08-02 ENCOUNTER — Telehealth: Payer: Self-pay | Admitting: Hematology

## 2017-08-02 ENCOUNTER — Telehealth: Payer: Self-pay | Admitting: Nurse Practitioner

## 2017-08-02 ENCOUNTER — Ambulatory Visit (HOSPITAL_COMMUNITY)
Admission: RE | Admit: 2017-08-02 | Discharge: 2017-08-02 | Disposition: A | Discharge status: Home / Self Care | Payer: Medicare Other | Source: Ambulatory Visit | Attending: Nurse Practitioner | Admitting: Nurse Practitioner

## 2017-08-02 ENCOUNTER — Encounter (HOSPITAL_COMMUNITY): Payer: Self-pay | Admitting: Interventional Radiology

## 2017-08-02 DIAGNOSIS — Z452 Encounter for adjustment and management of vascular access device: Secondary | ICD-10-CM | POA: Diagnosis not present

## 2017-08-02 DIAGNOSIS — C50912 Malignant neoplasm of unspecified site of left female breast: Secondary | ICD-10-CM | POA: Diagnosis not present

## 2017-08-02 DIAGNOSIS — T82898A Other specified complication of vascular prosthetic devices, implants and grafts, initial encounter: Secondary | ICD-10-CM | POA: Diagnosis not present

## 2017-08-02 HISTORY — PX: IR CV LINE INJECTION: IMG2294

## 2017-08-02 MED ORDER — HEPARIN SOD (PORK) LOCK FLUSH 100 UNIT/ML IV SOLN
INTRAVENOUS | Status: AC
  Filled 2017-08-02: qty 5

## 2017-08-02 MED ORDER — HEPARIN SOD (PORK) LOCK FLUSH 100 UNIT/ML IV SOLN
INTRAVENOUS | Status: AC | PRN
  Administered 2017-08-02: 500 [IU]

## 2017-08-02 MED ORDER — IOPAMIDOL (ISOVUE-300) INJECTION 61%
INTRAVENOUS | Status: AC
  Administered 2017-08-02: 10 mL via INTRAVENOUS
  Filled 2017-08-02: qty 50

## 2017-08-02 MED ORDER — IOPAMIDOL (ISOVUE-300) INJECTION 61%
50.0000 mL | Freq: Once | INTRAVENOUS | Status: AC | PRN
  Administered 2017-08-02: 10 mL via INTRAVENOUS

## 2017-08-02 NOTE — Procedures (Signed)
Breast ca  S/p rt subclavian port injection  Difficult access and no blood return  Port is patent with tip proximal svc  Needle left for chemo tomorrow am  Full report in pacs

## 2017-08-02 NOTE — Telephone Encounter (Signed)
PAC fluoro study completed today; line is patent, blood was aspirated. She remains accessed for chemo tomorrow 12/5. We reviewed schedule. Skin rash is improving, she feels well. Denies other questions at this time.

## 2017-08-02 NOTE — Telephone Encounter (Signed)
Scheduled appt per 12/3 sch msg - spoke with patient regarding appt.

## 2017-08-03 ENCOUNTER — Ambulatory Visit: Payer: Medicare Other

## 2017-08-03 ENCOUNTER — Other Ambulatory Visit (HOSPITAL_BASED_OUTPATIENT_CLINIC_OR_DEPARTMENT_OTHER): Payer: Medicare Other

## 2017-08-03 ENCOUNTER — Ambulatory Visit (HOSPITAL_BASED_OUTPATIENT_CLINIC_OR_DEPARTMENT_OTHER): Payer: Medicare Other

## 2017-08-03 DIAGNOSIS — C50212 Malignant neoplasm of upper-inner quadrant of left female breast: Secondary | ICD-10-CM

## 2017-08-03 DIAGNOSIS — Z5111 Encounter for antineoplastic chemotherapy: Secondary | ICD-10-CM

## 2017-08-03 DIAGNOSIS — Z171 Estrogen receptor negative status [ER-]: Principal | ICD-10-CM

## 2017-08-03 LAB — COMPREHENSIVE METABOLIC PANEL
ALBUMIN: 3.3 g/dL — AB (ref 3.5–5.0)
ALK PHOS: 59 U/L (ref 40–150)
ALT: 25 U/L (ref 0–55)
ANION GAP: 10 meq/L (ref 3–11)
AST: 24 U/L (ref 5–34)
BILIRUBIN TOTAL: 0.41 mg/dL (ref 0.20–1.20)
BUN: 16 mg/dL (ref 7.0–26.0)
CO2: 26 meq/L (ref 22–29)
CREATININE: 0.9 mg/dL (ref 0.6–1.1)
Calcium: 9.2 mg/dL (ref 8.4–10.4)
Chloride: 104 mEq/L (ref 98–109)
EGFR: 60 mL/min/{1.73_m2} — ABNORMAL LOW (ref >60)
Glucose: 107 mg/dl (ref 70–140)
Potassium: 4.1 mEq/L (ref 3.5–5.1)
Sodium: 141 mEq/L (ref 136–145)
TOTAL PROTEIN: 6.3 g/dL — AB (ref 6.4–8.3)

## 2017-08-03 LAB — CBC WITH DIFFERENTIAL/PLATELET
BASO%: 1.2 % (ref 0.0–2.0)
Basophils Absolute: 0.3 10*3/uL — ABNORMAL HIGH (ref 0.0–0.1)
EOS ABS: 0 10*3/uL (ref 0.0–0.5)
EOS%: 0.1 % (ref 0.0–7.0)
HEMATOCRIT: 30.5 % — AB (ref 34.8–46.6)
HEMOGLOBIN: 9.9 g/dL — AB (ref 11.6–15.9)
LYMPH#: 1.5 10*3/uL (ref 0.9–3.3)
LYMPH%: 7 % — ABNORMAL LOW (ref 14.0–49.7)
MCH: 28.7 pg (ref 25.1–34.0)
MCHC: 32.5 g/dL (ref 31.5–36.0)
MCV: 88.4 fL (ref 79.5–101.0)
MONO#: 3.5 10*3/uL — AB (ref 0.1–0.9)
MONO%: 16.3 % — AB (ref 0.0–14.0)
NEUT%: 75.4 % (ref 38.4–76.8)
NEUTROS ABS: 16.4 10*3/uL — AB (ref 1.5–6.5)
NRBC: 1 % — AB (ref 0–0)
PLATELETS: 469 10*3/uL — AB (ref 145–400)
RBC: 3.45 10*6/uL — AB (ref 3.70–5.45)
RDW: 15.6 % — AB (ref 11.2–14.5)
WBC: 21.7 10*3/uL — AB (ref 3.9–10.3)

## 2017-08-03 MED ORDER — SODIUM CHLORIDE 0.9 % IV SOLN
540.0000 mg/m2 | Freq: Once | INTRAVENOUS | Status: AC
  Administered 2017-08-03: 1300 mg via INTRAVENOUS
  Filled 2017-08-03: qty 65

## 2017-08-03 MED ORDER — PEGFILGRASTIM 6 MG/0.6ML ~~LOC~~ PSKT
6.0000 mg | PREFILLED_SYRINGE | Freq: Once | SUBCUTANEOUS | Status: AC
  Administered 2017-08-03: 6 mg via SUBCUTANEOUS
  Filled 2017-08-03: qty 0.6

## 2017-08-03 MED ORDER — SODIUM CHLORIDE 0.9 % IV SOLN
Freq: Once | INTRAVENOUS | Status: AC
  Administered 2017-08-03: 10:00:00 via INTRAVENOUS

## 2017-08-03 MED ORDER — PALONOSETRON HCL INJECTION 0.25 MG/5ML
0.2500 mg | Freq: Once | INTRAVENOUS | Status: AC
  Administered 2017-08-03: 0.25 mg via INTRAVENOUS

## 2017-08-03 MED ORDER — SODIUM CHLORIDE 0.9% FLUSH
10.0000 mL | INTRAVENOUS | Status: DC | PRN
  Administered 2017-08-03: 10 mL
  Filled 2017-08-03: qty 10

## 2017-08-03 MED ORDER — PALONOSETRON HCL INJECTION 0.25 MG/5ML
INTRAVENOUS | Status: AC
  Filled 2017-08-03: qty 5

## 2017-08-03 MED ORDER — DOXORUBICIN HCL CHEMO IV INJECTION 2 MG/ML
54.0000 mg/m2 | Freq: Once | INTRAVENOUS | Status: AC
  Administered 2017-08-03: 130 mg via INTRAVENOUS
  Filled 2017-08-03: qty 65

## 2017-08-03 MED ORDER — SODIUM CHLORIDE 0.9 % IV SOLN
Freq: Once | INTRAVENOUS | Status: AC
  Administered 2017-08-03: 10:00:00 via INTRAVENOUS
  Filled 2017-08-03: qty 5

## 2017-08-03 MED ORDER — HEPARIN SOD (PORK) LOCK FLUSH 100 UNIT/ML IV SOLN
500.0000 [IU] | Freq: Once | INTRAVENOUS | Status: AC | PRN
  Administered 2017-08-03: 500 [IU]
  Filled 2017-08-03: qty 5

## 2017-08-03 NOTE — Patient Instructions (Signed)
[facility] Discharge Instructions for Patients Receiving Chemotherapy  Today you received the following chemotherapy agents Adriamycin, Cytoxan.  To help prevent nausea and vomiting after your treatment, we encourage you to take your nausea medication as prescribed.   If you develop nausea and vomiting that is not controlled by your nausea medication, call the clinic.   BELOW ARE SYMPTOMS THAT SHOULD BE REPORTED IMMEDIATELY:  *FEVER GREATER THAN 100.5 F  *CHILLS WITH OR WITHOUT FEVER  NAUSEA AND VOMITING THAT IS NOT CONTROLLED WITH YOUR NAUSEA MEDICATION  *UNUSUAL SHORTNESS OF BREATH  *UNUSUAL BRUISING OR BLEEDING  TENDERNESS IN MOUTH AND THROAT WITH OR WITHOUT PRESENCE OF ULCERS  *URINARY PROBLEMS  *BOWEL PROBLEMS  UNUSUAL RASH Items with * indicate a potential emergency and should be followed up as soon as possible.  Feel free to call the clinic should you have any questions or concerns. The clinic phone number is [phone].  Please show the [place] at check-in to the Emergency Department and triage nurse.

## 2017-08-04 LAB — CULTURE, BLOOD (SINGLE)

## 2017-08-10 ENCOUNTER — Telehealth: Payer: Self-pay | Admitting: *Deleted

## 2017-08-10 NOTE — Telephone Encounter (Signed)
Pt had chemo AC on Wed  08/03/17 with Onpro.  Pt is scheduled for next cycle on Friday  08/12/17.  Spoke with pt and informed pt that appts will be rescheduled  to [facility]  08/17/17 as per Lenna Sciara, pharmacist.  Pt voiced understanding. Schedule message sent.

## 2017-08-11 ENCOUNTER — Telehealth: Payer: Self-pay | Admitting: Hematology

## 2017-08-11 NOTE — Telephone Encounter (Signed)
Scheduled appt per 12/07 sch message - patient is aware of appt date and time. 12/19 7:30 am .

## 2017-08-12 ENCOUNTER — Ambulatory Visit: Payer: Medicare Other | Admitting: Hematology

## 2017-08-12 ENCOUNTER — Other Ambulatory Visit: Payer: Medicare Other

## 2017-08-12 ENCOUNTER — Ambulatory Visit: Payer: Medicare Other

## 2017-08-15 NOTE — Progress Notes (Signed)
[facility]  [phone] [phone]  Clinic Follow Up Note   Patient Care Team: Ronita Hipps, MD as PCP - General (Family Medicine) Coralie Keens, MD as Consulting Physician (General Surgery) Richardo Priest, MD as Consulting Physician (Cardiology) 08/17/2017  CHIEF COMPLAINTS  F/u left breast cancer, triple negative, stage IIIB  ONCOLOGY HISTORY: Oncology History   Cancer Staging Malignant neoplasm of upper-inner quadrant of left breast in female, estrogen receptor negative ([place]) Staging form: Breast, AJCC 8th Edition - Clinical stage from 06/10/2017: Stage IIIB (cT2, cN1, cM0, G3, ER: Negative, PR: Negative, HER2: Negative) - Signed by Truitt Merle, MD on 06/21/2017       Malignant neoplasm of upper-inner quadrant of left breast in female, estrogen receptor negative ([place])   06/08/2017 Imaging    DIAGNOSTIC MAMMO and [place] Left Breast 06/08/17 IMPRESSION:  Highly suspicious palpable left breast 11 o'clock mass which measures 2.8 cm in greatest dimension. Ductal extension anteriorly to the level of the nipple, and posteriorly for additional 1.8cm, is suspicious for tumor extension.       06/10/2017 Initial Diagnosis    Malignant neoplasm of upper-inner quadrant of left breast in female, estrogen receptor negative ([place])      06/10/2017 Pathology Results    Diagnosis 06/10/17 1.) Breast, left, needle core biopsy -INVASIVE DUCTAL CARCINOMA, GRADE 3, WITH FOCAL HIGH GRADE DUCTAL CARCINOMA IN SITU -NEOPLASM INVOLVES ALL CORES AND MEASURES APPROXIMATELY 1.5CM IN MAXIMAL LINEAR DIMENSION  2.) Lymph node, needle/core biopsy, left axilla -METASTATIC CARCINOMA CONSISTENT WITH BREAST PRIMARY       06/10/2017 Receptors her2    ER: -, PR-, HER2 not amplified       06/23/2017 Echocardiogram    ECHO 06/23/17 Study Conclusions - Left ventricle: The cavity size was normal. Wall thickness was   increased in a pattern of moderate LVH. Systolic  function was   normal. The estimated ejection fraction was in the range of 60%   to 65%. Wall motion was normal; there were no regional wall   motion abnormalities. Left ventricular diastolic function   parameters were normal. - Mitral valve: Calcified annulus. Mildly thickened leaflets . - Left atrium: The atrium was mildly dilated. - Atrial septum: There was increased thickness of the septum,   consistent with lipomatous hypertrophy. No defect or patent   foramen ovale was identified. - Impressions: Normal GLS - 18.6 Impressions: - Normal GLS - 18.6      06/28/2017 Imaging    CT CAP W Contrast 06/28/17 IMPRESSION: 1. Mildly enlarged left axillary lymph node at 1.5 cm, a significant change from the prior chest CT from 2015, suspicious for left axillary metastatic disease. 2. Left upper breast lesion 2.1 cm in diameter with higher density margins and lower density center, favoring lumpectomy site. 3. Mildly enlarged right hilar lymph node, but stable compared back through 2015. 4. No findings of osseous metastatic disease or additional metastatic sites. 5. Other imaging findings of potential clinical significance: Aberrant right subclavian artery passes behind the esophagus. Aortic Atherosclerosis (ICD10-I70.0) and Emphysema (ICD10-J43.9). Coronary atherosclerosis. Mild nodular enlargement of the left inferior thyroid lobe, isodense to the rest of the thyroid. Airway thickening is present, suggesting bronchitis or reactive airways disease. Left hepatic lobe cyst. Left renal cyst. Likely small cysts in the right kidney. Sigmoid colon diverticulosis.       06/28/2017 Imaging    Bone Scan 06/28/17 IMPRESSION: No evidence of osseous metastatic disease. Subtle increased radiotracer uptake within the left chest wall,  likely within the left breast.      06/30/2017 Surgery    Port placement by Dr. [name] on 06/30/17      07/01/2017 -  Chemotherapy    Neoadjuvant  Adriamycin and Cytoxan [facility]) every 2 weeks for 4 cycles starting 07/01/17, followed byTaxol every 2 weeks for 4 cycles        07/23/2017 - 07/26/2017 Hospital Admission    Admit date: 07/23/17 Admission diagnosis: 07/26/17 Additional comments: febrile neutropenia; diverticulitis      HISTORY OF PRESENTING ILLNESS:  [name] 71 y.o. female is here because of left breast cancer.   Initially, the patient palpated a lump within the retroareolar region of the left breast at the end of July. She also had noticed some left nipple inversion, soreness, and hardness around the nipple for several months prior. In the past three months she reports that her soreness had improved originally, but since then she has not noticed any changes or worsening. She denies any abdominal pain, back pain, cough, chest pain, loss of appetite, weight loss, or any other associated symptoms in this time period.   She subsequently underwent mammogram and US of the left breast including the axilla which was notable for a suspicious mass within the 11 o'clock region of the left breast measuring at 2.8cm in the greatest dimension. Following this, she underwent stereotactic needle-core biopsy showing her to have invasive ductal carcinoma, histologic analysis demonstrating triple negative breast cancer, Ki67 20%. Additionally, an enlarged lymph node was surveyed within the left axilla which was positive for metastatic carcinoma.   On 06/17/17, she met with Dr [name] of [facility]. Given her triple negative cancer and the invasive nature of her disease, he recommended mastectomy with SLN dissection with port placement. She was subsequently referred into medical oncology for ongoing management of her disease to consider beginning surgery vs chemotherapy as the first round of treatment.   She reports today with her husband and sister. Overall she has been doing well and without notable acute complaints. Her breast  soreness has resolved since initial onset. She has no other symptoms of concern to her. Of note, both of her sisters have also been diagnosed with breast cancer and she has an extensive family history of other cancers, including her mother who had pancreatic cancer.   GYN HISTORY  Menarchal: 71 years of age LMP: 21 years ago Contraceptive: Yes in her youth HRT: none GP: G2P2, she was 71yo at the age of first delivery. She did not breast feed.   She did previously smoke for approximately 50 years. She quit three years ago on August 4th, 2015. On average, she smoked 2ppd. She does not drink any alcoholic beverages and does not partake in illicit drugs.    CURRENT THERAPY: Neoadjuvant Adriamycin and Cytoxan (AC) every 2 weeks for 4 cycles starting 07/01/17,  reduced 10% due to poor toleration starting with cycle 2. Start Carbo and taxol weekly for 12 cycles 08/31/17   INTERVAL HISTORY:  Lindsay [name] is here for a follow up and cycle 4 AC. She presents to the clinic today accompanied by her family member.  She knows she felt bad for about a few days after last treatment. She had felt SOB which will resolve after sitting. Her Hg is 8.1, she will need blood transfusion. She agreed. She denies chest pain, nausea or diarrhea.      MEDICAL HISTORY:  Past Medical History:  Diagnosis Date  . Cancer ([place])  left breast cancer  . Dysrhythmia    PACs and nonsustained runs of atrial tach by Holter 10/13/16  . History of kidney stones   . Hypertension     SURGICAL HISTORY: Past Surgical History:  Procedure Laterality Date  . COLONOSCOPY    . FOOT SURGERY Right   . IR CV LINE INJECTION  08/02/2017  . left breast lumpectomy  2000  . PORTACATH PLACEMENT Right 06/30/2017   Procedure: [place];  Surgeon: Coralie Keens, MD;  Location: [facility];  Service: General;  Laterality: Right;    SOCIAL HISTORY: Social History   Socioeconomic History  . Marital status: Married     Spouse name: Not on file  . Number of children: Not on file  . Years of education: Not on file  . Highest education level: Not on file  Social Needs  . Financial resource strain: Not on file  . Food insecurity - worry: Not on file  . Food insecurity - inability: Not on file  . Transportation needs - medical: Not on file  . Transportation needs - non-medical: Not on file  Occupational History  . Not on file  Tobacco Use  . Smoking status: Former Smoker    Packs/day: 2.00    Years: 50.00    Pack years: 100.00    Last attempt to quit: 06/02/2014    Years since quitting: 3.2  . Smokeless tobacco: Never Used  Substance and Sexual Activity  . Alcohol use: No  . Drug use: No  . Sexual activity: Yes  Other Topics Concern  . Not on file  Social History Narrative  . Not on file    FAMILY HISTORY: Family History  Problem Relation Age of Onset  . Cancer Mother 31       pancreatic cancer   . Cancer Sister 17       breast cancer   . Cancer Paternal Aunt        breast cancer   . Cancer Sister 66       breast cancer   . Cancer Sister        Melanoma  . Cancer Sister 75       colon cancer     ALLERGIES:  has No Known Allergies.  MEDICATIONS:  Current Outpatient Medications  Medication Sig Dispense Refill  . acebutolol (SECTRAL) 200 MG capsule Take 200 mg by mouth 2 (two) times daily.    Marland Kitchen aspirin EC 81 MG tablet Take 81 mg by mouth daily.    Marland Kitchen atorvastatin (LIPITOR) 10 MG tablet Take 10 mg by mouth at bedtime.    . calcium carbonate (OS-CAL) 600 MG TABS tablet Take 1,200 mg by mouth daily.    . Cholecalciferol (VITAMIN D3) 2000 units TABS Take 2,000 Units by mouth daily.     . cloNIDine (CATAPRES) 0.1 MG tablet Take 0.1 mg by mouth 2 (two) times daily.    . DOCOSAHEXAENOIC ACID PO Take 1,200 mg by mouth 2 (two) times daily.    Marland Kitchen escitalopram (LEXAPRO) 5 MG tablet Take 5 mg by mouth daily.    . hydrALAZINE (APRESOLINE) 25 MG tablet Take 12.5 mg by mouth 3 (three) times daily.     Marland Kitchen lidocaine-prilocaine (EMLA) cream Apply 1 application topically as needed. 30 g 2  . loratadine (CLARITIN) 10 MG tablet Take 10 mg by mouth daily.    Marland Kitchen LORazepam (ATIVAN) 1 MG tablet Take 1 tablet (1 mg total) by mouth once as needed for anxiety.  Take 1 tab 1 hour before MRI scan 2 tablet 0  . magnesium oxide (MAG-OX) 400 MG tablet Take 400 mg by mouth daily.    . Multiple Vitamins-Minerals (EYE VITAMINS PO) Take 1 capsule by mouth 2 (two) times daily.    Marland Kitchen olmesartan-hydrochlorothiazide (BENICAR HCT) 40-25 MG tablet Take 1 tablet by mouth daily.  1  . ondansetron (ZOFRAN) 8 MG tablet Take 1 tablet (8 mg total) by mouth every 8 (eight) hours as needed for nausea or vomiting. (Patient not taking: Reported on 07/29/2017) 20 tablet 2  . prochlorperazine (COMPAZINE) 10 MG tablet Take 1 tablet (10 mg total) by mouth every 6 (six) hours as needed for nausea or vomiting. (Patient not taking: Reported on 07/29/2017) 30 tablet 2  . vitamin B-12 (CYANOCOBALAMIN) 1000 MCG tablet Take 1 tablet by mouth daily.     No current facility-administered medications for this visit.    Facility-Administered Medications Ordered in Other Visits  Medication Dose Route Frequency Provider Last Rate Last Dose  . sodium chloride flush (NS) 0.9 % injection 10 mL  10 mL Intravenous PRN Truitt Merle, MD   10 mL at 07/29/17 1310    REVIEW OF SYSTEMS:   Constitutional: Denies fevers, chills or abnormal night sweats (+) significant fatigue  Eyes: Denies blurriness of vision, double vision or watery eyes Ears, nose, mouth, throat, and face: Denies mucositis or sore throat Respiratory: Denies cough, wheezes (+) SOB upon exertion increased Cardiovascular: Denies palpitation, chest discomfort or lower extremity swelling Gastrointestinal:  Denies nausea, heartburn or change in bowel habits Skin: Denies abnormal skin rashes Lymphatics: Denies new lymphadenopathy or easy bruising MSK: (+) Mild lower back pain    Neurological:Denies numbness, tingling or new weaknesses Behavioral/Psych: Mood is stable, no new changes  All other systems were reviewed with the patient and are negative.  PHYSICAL EXAMINATION: ECOG PERFORMANCE STATUS: 1-2 Blood pressure 125/55, heart rate 73, respiratory rate 18, temperature 37.0, pulse ox 95% on room air GENERAL:alert, no distress and comfortable SKIN: skin color, texture, turgor are normal, no rashes or significant lesions EYES: normal, conjunctiva are pink and non-injected, sclera clear OROPHARYNX:no exudate, no erythema and lips, buccal mucosa, and tongue normal  NECK: supple, thyroid normal size, non-tender, without nodularity LYMPH:  no palpable lymphadenopathy in the cervical, axillary or inguinal LUNGS: clear to auscultation and percussion with normal breathing effort HEART: regular rate & rhythm and no murmurs and no lower extremity edema ABDOMEN:abdomen soft, non-tender and normal bowel sounds Musculoskeletal:no cyanosis of digits and no clubbing  PSYCH: alert & oriented x 3 with fluent speech NEURO: no focal motor/sensory deficits BREAST: deferred today    LABORATORY DATA:  I have reviewed the data as listed CBC Latest Ref Rng & Units 08/17/2017 08/03/2017 07/29/2017  WBC 3.9 - 10.3 10e3/uL 20.9(H) 21.7(H) 45.2(H)  Hemoglobin 11.6 - 15.9 g/dL 8.1(L) 9.9(L) 10.3(L)  Hematocrit 34.8 - 46.6 % 24.6(L) 30.5(L) 30.7(L)  Platelets 145 - 400 10e3/uL 221 469(H) 159   PATHOLOGY:  Diagnosis 06/10/17 1.) Breast, left, needle core biopsy -INVASIVE DUCTAL CARCINOMA, GRADE 3, WITH FOCAL HIGH GRADE DUCTAL CARCINOMA IN SITU -NEOPLASM INVOLVES ALL CORES AND MEASURES APPROXIMATELY 1.5CM IN MAXIMAL LINEAR DIMENSION -A BREAST PROGNOSTIC PROFILE WILL BE ORDERED ON BLOCK 1A AND SEPARATELY REPORTED   2.) Lymph node, needle/core biopsy, left axilla -METASTATIC CARCINOMA CONSISTENT WITH BREAST PRIMARY    PROCEDURES  ECHO 06/23/17 Study Conclusions - Left  ventricle: The cavity size was normal. Wall thickness was   increased in a pattern  of moderate LVH. Systolic function was   normal. The estimated ejection fraction was in the range of 60%   to 65%. Wall motion was normal; there were no regional wall   motion abnormalities. Left ventricular diastolic function   parameters were normal. - Mitral valve: Calcified annulus. Mildly thickened leaflets . - Left atrium: The atrium was mildly dilated. - Atrial septum: There was increased thickness of the septum,   consistent with lipomatous hypertrophy. No defect or patent   foramen ovale was identified. - Impressions: Normal GLS - 18.6 Impressions: - Normal GLS - 18.6   RADIOGRAPHIC STUDIES: I have personally reviewed the radiological images as listed and agreed with the findings in the report. Ir Cv Line Injection  Result Date: 08/02/2017 INDICATION: Right subclavian port catheter, difficult access, difficult blood return, left breast cancer EXAM: Fluoroscopic injection of the right subclavian port catheter MEDICATIONS: None. ANESTHESIA/SEDATION: None. FLUOROSCOPY TIME:  Fluoroscopy Time: 24 seconds (33 mGy). COMPLICATIONS: None immediate. PROCEDURE: Informed written consent was obtained from the patient after a thorough discussion of the procedural risks, benefits and alternatives. All questions were addressed. Maximal Sterile Barrier Technique was utilized including caps, mask, sterile gowns, sterile gloves, sterile drape, hand hygiene and skin antiseptic. A timeout was performed prior to the initiation of the procedure. Under fluoroscopy and sterile conditions, the right port catheter was accessed. There was difficulty positioning the needle within the port catheter because of the soft tissue depth. Needle was confirmed within the port catheter. Blood was aspirated. Contrast injection confirms patency of the port catheter. Tip is in the proximal SVC. Negative for fiber sheath. IMPRESSION: Right  subclavian port catheter is patent.  Tip in the proximal SVC. Electronically Signed   By: Jerilynn Mages.  Shick M.D.   On: 08/02/2017 14:41     [place] Left Breast 06/08/17 IMPRESSION:  Highly suspicious palpable left breast 11 o'clock mass which measures 2.8 cm in greatest dimension. Ductal extension anteriorly to the level of the nipple, and posteriorly for additional 1.8cm, is suspicious for tumor extension.   ASSESSMENT: LASHONA SCHAAF is a wonderful 71 y.o. female who presents today to discuss the ongoing management of her left breast cancer.   1. 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  -We discussed her mammogram, ultrasound findings, and initial biopsy results with patient and her family members in great detail.  -She presented with a palpable left breast mass, measures 2.8 cm on ultrasound, with left axillary lymph node involvement (not palpable on exam). Breast tumor and node biopsy showed triple negative breast ductal carcinoma. -She was seen by surgeon Dr. [name] who recommended left mastectomy with ALND and port placement.  -We discussed her risk of cancer recurrence after complete surgical resection. Due to the relatively large size of her primary tumor, positive lymph nodes, and aggressive nature of her triple negative breast cancer, she is at very high risk of recurrence. -Due to her positive family history of breast cancer, we recommended her to see genetics, to ruled out inheritable breast cancer syndrome.   -Her 06/28/17 CT CAP and bone scan shows no  evidence of metastasis. MRI was not done due to logistics, will be rescheduled at [place]  -She started neoadjuvant AC with onpro on 07/01/17, she experienced significant fatigue, anorexia and some diarrhea and constipation and become febrile once.  She has lost 5 pounds in the past 2 weeks. I reduced her cycle  2 10% -Ms  Topete was hospitalized 11/24-11/27 for neutropenic fever and diverticulitis after cycle 2 chemotherapy.  She developed skin rash after hospital discharge, likely related to her oral antibiotics Levaquin and or Flagyl.  She has significant leukocytosis, likely related to her recent Neupogen she received in the hospital.  No clinical suspicion for sepsis or infection.  Due to port dysfunction, cycle 3 was post poned 1 week. -labs reviewed, hg at 8.1. She is symptomatic and will need blood transfusion. I discussed what to expect. She agreed to proceed.  -Labs adequate to proceed with last cycle AC today at same dose. - Plan to start her on carboplatin and Taxol weekly in 2 weeks for a total of 12 weeks. Give her node positive triple negative disease, I think she would benefit from carboplatin also.  I have referred to genetics, but she has not been seen, will refer her again.  She would certainly benefit more if she has BRCA mutations.  However she lives in [place], and has some difficulty tolerating chemotherapy, she has been reluctant to do intensive chemo.  If she has had great response to [facility], I may offer Taxol alone biweekly for 4 treatments. -I will obtain a interim mammogram and [place] to evaluate her response to neoadjuvant chemo  -F/u in 2 weeks     2. AFIB and HTN -She is currently followed by a cardiologist for ongoing management of this. Not currently on formal anticoagulation therapy.  -She was on 10m ASA daily, but she reports that she recently stopped this in anticipation for her surgery. I encouraged her to start taking this again until her surgeon tell her to stop taking this.  -Baseline Echo adequate to start chemo  3. Genetics -I strongly encouraged her today to have genetic testing due to her strong family history of cancer. If this is positive, I informed her that her children and grandchildren would need to be tested as well. She would like to proceed with this.  -Plan to see genetics  on 07/25/17   4. Diverticulitis requiring hospitalization and IV antibiotics  -Hospitalized 11/24-11/27 and treated with antibiotics Levaquin and or Flagyl -she acquired rash from her antibiotics and stopped the medication.     PLAN:  -Labs reviewed, HG 8.1. Will do type and cross today and Blood transfusion today  -[facility]  -Lab, f/u and CT in 2 weeks to start Taxol with or without carboplatin     Orders Placed This Encounter  Procedures  . Practitioner attestation of consent    I, the ordering practitioner, attest that I have discussed with the patient the benefits, risks, side effects, alternatives, likelihood of achieving goals and potential problems during recovery for the procedure listed.    Standing Status:   Future    Standing Expiration Date:   08/17/2018    Order Specific Question:   Procedure    Answer:   Blood Product(s)  . Complete patient signature process for consent form    Standing Status:   Future    Standing Expiration Date:   08/17/2018  . Care order/instruction    Transfuse Parameters    Standing Status:   Future    Standing Expiration Date:   08/17/2018    Scheduling Instructions:     STAT  [facility]  . Type and screen    Standing Status:   Future    Number of Occurrences:  1    Standing Expiration Date:   08/17/2018    Scheduling Instructions:     STAT XXM  For 2 units PRBCs for transfusion at [facility] today when available.  . Hold Tube, Blood Bank    Standing Status:   Future    Number of Occurrences:   1    Standing Expiration Date:   08/17/2018    Scheduling Instructions:     STAT  XXM for 2 units PRBCs for transfusion today at [facility] when available.   This document serves as a record of services personally performed by Truitt Merle, MD. It was created on her behalf by Joslyn Devon, a trained medical scribe. The creation of this record is based on the scribe's  personal observations and the provider's statements to them.    I have reviewed the above documentation for accuracy and completeness, and I agree with the above.     Truitt Merle, MD 08/17/2017

## 2017-08-17 ENCOUNTER — Ambulatory Visit (HOSPITAL_BASED_OUTPATIENT_CLINIC_OR_DEPARTMENT_OTHER): Payer: Medicare Other

## 2017-08-17 ENCOUNTER — Other Ambulatory Visit (HOSPITAL_BASED_OUTPATIENT_CLINIC_OR_DEPARTMENT_OTHER): Payer: Medicare Other

## 2017-08-17 ENCOUNTER — Ambulatory Visit (HOSPITAL_COMMUNITY)
Admission: RE | Admit: 2017-08-17 | Discharge: 2017-08-17 | Disposition: A | Discharge status: Home / Self Care | Payer: Medicare Other | Source: Ambulatory Visit | Attending: Hematology | Admitting: Hematology

## 2017-08-17 ENCOUNTER — Ambulatory Visit (HOSPITAL_BASED_OUTPATIENT_CLINIC_OR_DEPARTMENT_OTHER): Payer: Medicare Other | Admitting: Hematology

## 2017-08-17 VITALS — BP 141/54 | HR 71 | Temp 97.8°F | Resp 18 | Wt 260.8 lb

## 2017-08-17 DIAGNOSIS — Z171 Estrogen receptor negative status [ER-]: Principal | ICD-10-CM

## 2017-08-17 DIAGNOSIS — C50212 Malignant neoplasm of upper-inner quadrant of left female breast: Secondary | ICD-10-CM | POA: Diagnosis not present

## 2017-08-17 DIAGNOSIS — D638 Anemia in other chronic diseases classified elsewhere: Secondary | ICD-10-CM | POA: Diagnosis not present

## 2017-08-17 DIAGNOSIS — I4891 Unspecified atrial fibrillation: Secondary | ICD-10-CM

## 2017-08-17 DIAGNOSIS — Z5189 Encounter for other specified aftercare: Secondary | ICD-10-CM

## 2017-08-17 DIAGNOSIS — I10 Essential (primary) hypertension: Secondary | ICD-10-CM

## 2017-08-17 DIAGNOSIS — C773 Secondary and unspecified malignant neoplasm of axilla and upper limb lymph nodes: Secondary | ICD-10-CM | POA: Diagnosis not present

## 2017-08-17 DIAGNOSIS — Z5111 Encounter for antineoplastic chemotherapy: Secondary | ICD-10-CM

## 2017-08-17 LAB — COMPREHENSIVE METABOLIC PANEL
ALK PHOS: 82 U/L (ref 40–150)
ALT: 22 U/L (ref 0–55)
AST: 22 U/L (ref 5–34)
Albumin: 3.3 g/dL — ABNORMAL LOW (ref 3.5–5.0)
Anion Gap: 12 mEq/L — ABNORMAL HIGH (ref 3–11)
BUN: 16.4 mg/dL (ref 7.0–26.0)
CHLORIDE: 101 meq/L (ref 98–109)
CO2: 26 meq/L (ref 22–29)
Calcium: 9.1 mg/dL (ref 8.4–10.4)
Creatinine: 1.2 mg/dL — ABNORMAL HIGH (ref 0.6–1.1)
EGFR: 46 mL/min/{1.73_m2} — AB (ref >60)
GLUCOSE: 107 mg/dL (ref 70–140)
POTASSIUM: 4.1 meq/L (ref 3.5–5.1)
SODIUM: 139 meq/L (ref 136–145)
Total Bilirubin: 0.42 mg/dL (ref 0.20–1.20)
Total Protein: 6.4 g/dL (ref 6.4–8.3)

## 2017-08-17 LAB — CBC WITH DIFFERENTIAL/PLATELET
BASO%: 0.3 % (ref 0.0–2.0)
BASOS ABS: 0.1 10*3/uL (ref 0.0–0.1)
EOS ABS: 0.1 10*3/uL (ref 0.0–0.5)
EOS%: 0.5 % (ref 0.0–7.0)
HEMATOCRIT: 24.6 % — AB (ref 34.8–46.6)
HGB: 8.1 g/dL — ABNORMAL LOW (ref 11.6–15.9)
LYMPH#: 0.8 10*3/uL — AB (ref 0.9–3.3)
LYMPH%: 3.7 % — AB (ref 14.0–49.7)
MCH: 28.8 pg (ref 25.1–34.0)
MCHC: 32.8 g/dL (ref 31.5–36.0)
MCV: 87.9 fL (ref 79.5–101.0)
MONO#: 2.1 10*3/uL — AB (ref 0.1–0.9)
MONO%: 9.9 % (ref 0.0–14.0)
NEUT#: 17.9 10*3/uL — ABNORMAL HIGH (ref 1.5–6.5)
NEUT%: 85.6 % — AB (ref 38.4–76.8)
PLATELETS: 221 10*3/uL (ref 145–400)
RBC: 2.8 10*6/uL — ABNORMAL LOW (ref 3.70–5.45)
RDW: 14.9 % — ABNORMAL HIGH (ref 11.2–14.5)
WBC: 20.9 10*3/uL — ABNORMAL HIGH (ref 3.9–10.3)

## 2017-08-17 LAB — PREPARE RBC (CROSSMATCH)

## 2017-08-17 LAB — ABO/RH: ABO/RH(D): A POS

## 2017-08-17 MED ORDER — FOSAPREPITANT DIMEGLUMINE INJECTION 150 MG
Freq: Once | INTRAVENOUS | Status: AC
  Administered 2017-08-17: 10:00:00 via INTRAVENOUS
  Filled 2017-08-17: qty 5

## 2017-08-17 MED ORDER — HEPARIN SOD (PORK) LOCK FLUSH 100 UNIT/ML IV SOLN
500.0000 [IU] | Freq: Once | INTRAVENOUS | Status: AC | PRN
  Administered 2017-08-17: 500 [IU]
  Filled 2017-08-17: qty 5

## 2017-08-17 MED ORDER — PEGFILGRASTIM 6 MG/0.6ML ~~LOC~~ PSKT
PREFILLED_SYRINGE | SUBCUTANEOUS | Status: AC
  Filled 2017-08-17: qty 0.6

## 2017-08-17 MED ORDER — PEGFILGRASTIM 6 MG/0.6ML ~~LOC~~ PSKT
6.0000 mg | PREFILLED_SYRINGE | Freq: Once | SUBCUTANEOUS | Status: AC
  Administered 2017-08-17: 6 mg via SUBCUTANEOUS

## 2017-08-17 MED ORDER — PALONOSETRON HCL INJECTION 0.25 MG/5ML
INTRAVENOUS | Status: AC
  Filled 2017-08-17: qty 5

## 2017-08-17 MED ORDER — SODIUM CHLORIDE 0.9% FLUSH
10.0000 mL | INTRAVENOUS | Status: DC | PRN
  Administered 2017-08-17: 10 mL
  Filled 2017-08-17: qty 10

## 2017-08-17 MED ORDER — PALONOSETRON HCL INJECTION 0.25 MG/5ML
0.2500 mg | Freq: Once | INTRAVENOUS | Status: AC
  Administered 2017-08-17: 0.25 mg via INTRAVENOUS

## 2017-08-17 MED ORDER — SODIUM CHLORIDE 0.9 % IV SOLN
540.0000 mg/m2 | Freq: Once | INTRAVENOUS | Status: AC
  Administered 2017-08-17: 1300 mg via INTRAVENOUS
  Filled 2017-08-17: qty 65

## 2017-08-17 MED ORDER — SODIUM CHLORIDE 0.9 % IV SOLN
Freq: Once | INTRAVENOUS | Status: AC
  Administered 2017-08-17: 10:00:00 via INTRAVENOUS

## 2017-08-17 MED ORDER — DOXORUBICIN HCL CHEMO IV INJECTION 2 MG/ML
54.0000 mg/m2 | Freq: Once | INTRAVENOUS | Status: AC
  Administered 2017-08-17: 130 mg via INTRAVENOUS
  Filled 2017-08-17: qty 65

## 2017-08-17 NOTE — Patient Instructions (Addendum)
[place] Discharge Instructions for Patients Receiving Chemotherapy  Today you received the following chemotherapy agents Adriamycin and Cytoxan.  To help prevent nausea and vomiting after your treatment, we encourage you to take your nausea medication as directed.   If you develop nausea and vomiting that is not controlled by your nausea medication, call the clinic.   BELOW ARE SYMPTOMS THAT SHOULD BE REPORTED IMMEDIATELY:  *FEVER GREATER THAN 100.5 F  *CHILLS WITH OR WITHOUT FEVER  NAUSEA AND VOMITING THAT IS NOT CONTROLLED WITH YOUR NAUSEA MEDICATION  *UNUSUAL SHORTNESS OF BREATH  *UNUSUAL BRUISING OR BLEEDING  TENDERNESS IN MOUTH AND THROAT WITH OR WITHOUT PRESENCE OF ULCERS  *URINARY PROBLEMS  *BOWEL PROBLEMS  UNUSUAL RASH Items with * indicate a potential emergency and should be followed up as soon as possible.  Feel free to call the clinic should you have any questions or concerns. The clinic phone number is [phone].  Please show the [place] at check-in to the Emergency Department and triage nurse.   Blood Transfusion, Care After This sheet gives you information about how to care for yourself after your procedure. Your doctor may also give you more specific instructions. If you have problems or questions, contact your doctor. Follow these instructions at home:  Take over-the-counter and prescription medicines only as told by your doctor.  Go back to your normal activities as told by your doctor.  Follow instructions from your doctor about how to take care of the area where an IV tube was put into your vein (insertion site). Make sure you: ? Wash your hands with soap and water before you change your bandage (dressing). If there is no soap and water, use hand sanitizer. ? Change your bandage as told by your doctor.  Check your IV insertion site every day for signs of infection. Check for: ? More redness, swelling, or pain. ? More  fluid or blood. ? Warmth. ? Pus or a bad smell. Contact a doctor if:  You have more redness, swelling, or pain around the IV insertion site..  You have more fluid or blood coming from the IV insertion site.  Your IV insertion site feels warm to the touch.  You have pus or a bad smell coming from the IV insertion site.  Your pee (urine) turns pink, red, or brown.  You feel weak after doing your normal activities. Get help right away if:  You have signs of a serious allergic or body defense (immune) system reaction, including: ? Itchiness. ? Hives. ? Trouble breathing. ? Anxiety. ? Pain in your chest or lower back. ? Fever, flushing, and chills. ? Fast pulse. ? Rash. ? Watery poop (diarrhea). ? Throwing up (vomiting). ? Dark pee. ? Serious headache. ? Dizziness. ? Stiff neck. ? Yellow color in your face or the white parts of your eyes (jaundice). Summary  After a blood transfusion, return to your normal activities as told by your doctor.  Every day, check for signs of infection where the IV tube was put into your vein.  Some signs of infection are warm skin, more redness and pain, more fluid or blood, and pus or a bad smell where the needle went in.  Contact your doctor if you feel weak or have any unusual symptoms. This information is not intended to replace advice given to you by your health care provider. Make sure you discuss any questions you have with your health care provider. Document Released: 09/06/2014 Document Revised: 04/09/2016  Document Reviewed: 04/09/2016 Elsevier Interactive Patient Education  2017 [place].  Blood Transfusion, Care After This sheet gives you information about how to care for yourself after your procedure. Your doctor may also give you more specific instructions. If you have problems or questions, contact your doctor. Follow these instructions at home:  Take over-the-counter and prescription medicines only as told by your  doctor.  Go back to your normal activities as told by your doctor.  Follow instructions from your doctor about how to take care of the area where an IV tube was put into your vein (insertion site). Make sure you: ? Wash your hands with soap and water before you change your bandage (dressing). If there is no soap and water, use hand sanitizer. ? Change your bandage as told by your doctor.  Check your IV insertion site every day for signs of infection. Check for: ? More redness, swelling, or pain. ? More fluid or blood. ? Warmth. ? Pus or a bad smell. Contact a doctor if:  You have more redness, swelling, or pain around the IV insertion site..  You have more fluid or blood coming from the IV insertion site.  Your IV insertion site feels warm to the touch.  You have pus or a bad smell coming from the IV insertion site.  Your pee (urine) turns pink, red, or brown.  You feel weak after doing your normal activities. Get help right away if:  You have signs of a serious allergic or body defense (immune) system reaction, including: ? Itchiness. ? Hives. ? Trouble breathing. ? Anxiety. ? Pain in your chest or lower back. ? Fever, flushing, and chills. ? Fast pulse. ? Rash. ? Watery poop (diarrhea). ? Throwing up (vomiting). ? Dark pee. ? Serious headache. ? Dizziness. ? Stiff neck. ? Yellow color in your face or the white parts of your eyes (jaundice). Summary  After a blood transfusion, return to your normal activities as told by your doctor.  Every day, check for signs of infection where the IV tube was put into your vein.  Some signs of infection are warm skin, more redness and pain, more fluid or blood, and pus or a bad smell where the needle went in.  Contact your doctor if you feel weak or have any unusual symptoms. This information is not intended to replace advice given to you by your health care provider. Make sure you discuss any questions you have with your  health care provider. Document Released: 09/06/2014 Document Revised: 04/09/2016 Document Reviewed: 04/09/2016 Elsevier Interactive Patient Education  2017 [employer].

## 2017-08-18 ENCOUNTER — Telehealth: Payer: Self-pay | Admitting: Genetics

## 2017-08-18 ENCOUNTER — Encounter: Payer: Self-pay | Admitting: Hematology

## 2017-08-18 ENCOUNTER — Other Ambulatory Visit: Payer: Self-pay | Admitting: *Deleted

## 2017-08-18 LAB — BPAM RBC
BLOOD PRODUCT EXPIRATION DATE: 201901102359
BLOOD PRODUCT EXPIRATION DATE: 201901112359
ISSUE DATE / TIME: 201812191121
ISSUE DATE / TIME: 201812191121
UNIT TYPE AND RH: 6200
Unit Type and Rh: 6200

## 2017-08-18 LAB — TYPE AND SCREEN
ABO/RH(D): A POS
Antibody Screen: NEGATIVE
Unit division: 0
Unit division: 0

## 2017-08-18 NOTE — Telephone Encounter (Signed)
Patient called back regarding genetic counseling appointment.  I explained briefly that the testing can look for genetic/hereditary causes for her and her family's cancer and that Dr. Burr Medico indicated this information would be used to help decide which chemotherapy to give her.    The patient said that she did not want to meet with genetics and declined to make an appointment.

## 2017-08-18 NOTE — Addendum Note (Signed)
Addended by: Truitt Merle on: 08/18/2017 05:15 PM   Modules accepted: Orders

## 2017-08-19 ENCOUNTER — Telehealth: Payer: Self-pay | Admitting: *Deleted

## 2017-08-19 ENCOUNTER — Telehealth: Payer: Self-pay | Admitting: Hematology

## 2017-08-19 NOTE — Telephone Encounter (Signed)
Faxed orders to [facility] for mammogram & [place] to [phone].  Called pt yest & informed of appt on 08/29/17 for 3:40 pm & to be there 3:10 pm.  Pt expressed understanding.

## 2017-08-19 NOTE — Telephone Encounter (Signed)
Per YF and Dennis Bast added genetics appointment for after tx 1/2. Spoke with patient she is aware.

## 2017-08-26 ENCOUNTER — Ambulatory Visit: Payer: Medicare Other

## 2017-08-26 ENCOUNTER — Ambulatory Visit: Payer: Medicare Other | Admitting: Hematology

## 2017-08-26 ENCOUNTER — Other Ambulatory Visit: Payer: Medicare Other

## 2017-08-26 NOTE — Progress Notes (Signed)
[facility]  [phone] [phone]  Clinic Follow Up Note   Patient Care Team: Ronita Hipps, MD as PCP - General (Family Medicine) Coralie Keens, MD as Consulting Physician (General Surgery) Richardo Priest, MD as Consulting Physician (Cardiology)   Date of Service:  08/31/2017  CHIEF COMPLAINTS  F/u left breast cancer, triple negative, stage IIIB  ONCOLOGY HISTORY: Oncology History   Cancer Staging Malignant neoplasm of upper-inner quadrant of left breast in female, estrogen receptor negative ([place]) Staging form: Breast, AJCC 8th Edition - Clinical stage from 06/10/2017: Stage IIIB (cT2, cN1, cM0, G3, ER: Negative, PR: Negative, HER2: Negative) - Signed by Truitt Merle, MD on 06/21/2017       Malignant neoplasm of upper-inner quadrant of left breast in female, estrogen receptor negative ([place])   06/08/2017 Imaging    DIAGNOSTIC MAMMO and [place] Left Breast 06/08/17 IMPRESSION:  Highly suspicious palpable left breast 11 o'clock mass which measures 2.8 cm in greatest dimension. Ductal extension anteriorly to the level of the nipple, and posteriorly for additional 1.8cm, is suspicious for tumor extension.       06/10/2017 Initial Diagnosis    Malignant neoplasm of upper-inner quadrant of left breast in female, estrogen receptor negative ([place])      06/10/2017 Pathology Results    Diagnosis 06/10/17 1.) Breast, left, needle core biopsy -INVASIVE DUCTAL CARCINOMA, GRADE 3, WITH FOCAL HIGH GRADE DUCTAL CARCINOMA IN SITU -NEOPLASM INVOLVES ALL CORES AND MEASURES APPROXIMATELY 1.5CM IN MAXIMAL LINEAR DIMENSION  2.) Lymph node, needle/core biopsy, left axilla -METASTATIC CARCINOMA CONSISTENT WITH BREAST PRIMARY       06/10/2017 Receptors her2    ER: -, PR-, HER2 not amplified       06/23/2017 Echocardiogram    ECHO 06/23/17 Study Conclusions - Left ventricle: The cavity size was normal. Wall thickness was   increased in a pattern of  moderate LVH. Systolic function was   normal. The estimated ejection fraction was in the range of 60%   to 65%. Wall motion was normal; there were no regional wall   motion abnormalities. Left ventricular diastolic function   parameters were normal. - Mitral valve: Calcified annulus. Mildly thickened leaflets . - Left atrium: The atrium was mildly dilated. - Atrial septum: There was increased thickness of the septum,   consistent with lipomatous hypertrophy. No defect or patent   foramen ovale was identified. - Impressions: Normal GLS - 18.6 Impressions: - Normal GLS - 18.6      06/28/2017 Imaging    CT CAP W Contrast 06/28/17 IMPRESSION: 1. Mildly enlarged left axillary lymph node at 1.5 cm, a significant change from the prior chest CT from 2015, suspicious for left axillary metastatic disease. 2. Left upper breast lesion 2.1 cm in diameter with higher density margins and lower density center, favoring lumpectomy site. 3. Mildly enlarged right hilar lymph node, but stable compared back through 2015. 4. No findings of osseous metastatic disease or additional metastatic sites. 5. Other imaging findings of potential clinical significance: Aberrant right subclavian artery passes behind the esophagus. Aortic Atherosclerosis (ICD10-I70.0) and Emphysema (ICD10-J43.9). Coronary atherosclerosis. Mild nodular enlargement of the left inferior thyroid lobe, isodense to the rest of the thyroid. Airway thickening is present, suggesting bronchitis or reactive airways disease. Left hepatic lobe cyst. Left renal cyst. Likely small cysts in the right kidney. Sigmoid colon diverticulosis.       06/28/2017 Imaging    Bone Scan 06/28/17 IMPRESSION: No evidence of osseous metastatic disease. Subtle increased radiotracer  uptake within the left chest wall, likely within the left breast.      06/30/2017 Surgery    Port placement by Dr. [name] on 06/30/17      07/01/2017 -  Chemotherapy     Neoadjuvant Adriamycin and Cytoxan (AC) every 2 weeks for 4 cycles starting 07/01/17. Dose reduced starting with cycle 2. Compelted AC on 12.19.18. Started weekly Carbo and Taxol every on 08/31/17        07/23/2017 - 07/26/2017 Hospital Admission    Admit date: 07/23/17 Admission diagnosis: 07/26/17 Additional comments: febrile neutropenia; diverticulitis      08/29/2017 Mammogram    Diagnostic Mammogram and [place] 08/29/17  IMPRESSION:  Interval decrease in the size of known left 11 o'clock malignancy, with greatest dimension of 1.3cm.  Interval decrease in size of known left malignant axillary lymph node, with cortical thickness of 3 mm.        HISTORY OF PRESENTING ILLNESS:  Lindsay [name] 71 y.o. female is here because of left breast cancer.   Initially, the patient palpated a lump within the retroareolar region of the left breast at the end of July. She also had noticed some left nipple inversion, soreness, and hardness around the nipple for several months prior. In the past three months she reports that her soreness had improved originally, but since then she has not noticed any changes or worsening. She denies any abdominal pain, back pain, cough, chest pain, loss of appetite, weight loss, or any other associated symptoms in this time period.   She subsequently underwent mammogram and US of the left breast including the axilla which was notable for a suspicious mass within the 11 o'clock region of the left breast measuring at 2.8cm in the greatest dimension. Following this, she underwent stereotactic needle-core biopsy showing her to have invasive ductal carcinoma, histologic analysis demonstrating triple negative breast cancer, Ki67 20%. Additionally, an enlarged lymph node was surveyed within the left axilla which was positive for metastatic carcinoma.   On 06/17/17, she met with Dr [name] of [facility]. Given her triple negative cancer and the invasive nature of her  disease, he recommended mastectomy with SLN dissection with port placement. She was subsequently referred into medical oncology for ongoing management of her disease to consider beginning surgery vs chemotherapy as the first round of treatment.   She reports today with her husband and sister. Overall she has been doing well and without notable acute complaints. Her breast soreness has resolved since initial onset. She has no other symptoms of concern to her. Of note, both of her sisters have also been diagnosed with breast cancer and she has an extensive family history of other cancers, including her mother who had pancreatic cancer.   GYN HISTORY  Menarchal: 71 years of age LMP: 21 years ago Contraceptive: Yes in her youth HRT: none GP: G2P2, she was 71yo at the age of first delivery. She did not breast feed.   She did previously smoke for approximately 50 years. She quit three years ago on August 4th, 2015. On average, she smoked 2ppd. She does not drink any alcoholic beverages and does not partake in illicit drugs.    CURRENT THERAPY:  Neoadjuvant Adriamycin and Cytoxan (AC) every 2 weeks for 4 cycles starting 07/01/17,  reduced 10% due to poor toleration starting with cycle 2. Completed on 08/17/17. Start Carbo and taxol weekly for 12 cycles 08/31/17   INTERVAL HISTORY:  AJANAE VIRAG is here for a follow up and  cycle 1 of Taxol. She presents in the infusion room today accompanied by her family member.  She notes she felt better after her blood transfusion. She notes some epistaxis only when she blows her nose. She denies any other bleeding. She notes her temperature has increased to 18F and has occasional chills.    MEDICAL HISTORY:  Past Medical History:  Diagnosis Date  . Breast cancer ([place])   . Cancer [facility])    left breast cancer  . Dysrhythmia    PACs and nonsustained runs of atrial tach by Holter 10/13/16  . Family history of breast cancer   . Family history of colon cancer   .  Family history of melanoma   . Family history of pancreatic cancer   . History of kidney stones   . Hypertension   . Personal history of colonic polyps   . Personal history of skin cancer     SURGICAL HISTORY: Past Surgical History:  Procedure Laterality Date  . COLONOSCOPY    . FOOT SURGERY Right   . IR CV LINE INJECTION  08/02/2017  . left breast lumpectomy  2000  . PORTACATH PLACEMENT Right 06/30/2017   Procedure: [place];  Surgeon: Coralie Keens, MD;  Location: [facility];  Service: General;  Laterality: Right;    SOCIAL HISTORY: Social History   Socioeconomic History  . Marital status: Married    Spouse name: Not on file  . Number of children: Not on file  . Years of education: Not on file  . Highest education level: Not on file  Social Needs  . Financial resource strain: Not on file  . Food insecurity - worry: Not on file  . Food insecurity - inability: Not on file  . Transportation needs - medical: Not on file  . Transportation needs - non-medical: Not on file  Occupational History  . Not on file  Tobacco Use  . Smoking status: Former Smoker    Packs/day: 2.00    Years: 50.00    Pack years: 100.00    Last attempt to quit: 06/02/2014    Years since quitting: 3.2  . Smokeless tobacco: Never Used  Substance and Sexual Activity  . Alcohol use: No  . Drug use: No  . Sexual activity: Yes  Other Topics Concern  . Not on file  Social History Narrative  . Not on file    FAMILY HISTORY: Family History  Problem Relation Age of Onset  . Pancreatic cancer Mother 10  . Breast cancer Sister 68       bilateral mastectomy  . Basal cell carcinoma Sister        'many'  . Breast cancer Paternal Aunt   . Breast cancer Sister 90  . Melanoma Sister   . Colon cancer Sister 33    ALLERGIES:  has No Known Allergies.  MEDICATIONS:  Current Outpatient Medications  Medication Sig Dispense Refill  . acebutolol (SECTRAL) 200 MG capsule Take 200  mg by mouth 2 (two) times daily.    Marland Kitchen aspirin EC 81 MG tablet Take 81 mg by mouth daily.    Marland Kitchen atorvastatin (LIPITOR) 10 MG tablet Take 10 mg by mouth at bedtime.    . calcium carbonate (OS-CAL) 600 MG TABS tablet Take 1,200 mg by mouth daily.    . Cholecalciferol (VITAMIN D3) 2000 units TABS Take 2,000 Units by mouth daily.     . cloNIDine (CATAPRES) 0.1 MG tablet Take 0.1 mg by mouth 2 (two) times daily.    Marland Kitchen  DOCOSAHEXAENOIC ACID PO Take 1,200 mg by mouth 2 (two) times daily.    Marland Kitchen escitalopram (LEXAPRO) 5 MG tablet Take 5 mg by mouth daily.    . hydrALAZINE (APRESOLINE) 25 MG tablet Take 12.5 mg by mouth 3 (three) times daily.    Marland Kitchen lidocaine-prilocaine (EMLA) cream Apply 1 application topically as needed. 30 g 2  . loratadine (CLARITIN) 10 MG tablet Take 10 mg by mouth daily.    Marland Kitchen LORazepam (ATIVAN) 1 MG tablet Take 1 tablet (1 mg total) by mouth once as needed for anxiety. Take 1 tab 1 hour before MRI scan 2 tablet 0  . magnesium oxide (MAG-OX) 400 MG tablet Take 400 mg by mouth daily.    . Multiple Vitamins-Minerals (EYE VITAMINS PO) Take 1 capsule by mouth 2 (two) times daily.    Marland Kitchen olmesartan-hydrochlorothiazide (BENICAR HCT) 40-25 MG tablet Take 1 tablet by mouth daily.  1  . ondansetron (ZOFRAN) 8 MG tablet Take 1 tablet (8 mg total) by mouth every 8 (eight) hours as needed for nausea or vomiting. (Patient not taking: Reported on 07/29/2017) 20 tablet 2  . prochlorperazine (COMPAZINE) 10 MG tablet Take 1 tablet (10 mg total) by mouth every 6 (six) hours as needed for nausea or vomiting. (Patient not taking: Reported on 07/29/2017) 30 tablet 2  . vitamin B-12 (CYANOCOBALAMIN) 1000 MCG tablet Take 1 tablet by mouth daily.     No current facility-administered medications for this visit.    Facility-Administered Medications Ordered in Other Visits  Medication Dose Route Frequency Provider Last Rate Last Dose  . sodium chloride flush (NS) 0.9 % injection 10 mL  10 mL Intravenous PRN Truitt Merle, MD   10 mL at 07/29/17 1310  . sodium chloride flush (NS) 0.9 % injection 10 mL  10 mL Intracatheter PRN Truitt Merle, MD   10 mL at 08/31/17 1501    REVIEW OF SYSTEMS:   Constitutional: Denies fevers,  abnormal night sweats (+) significant fatigue (+) occasional chills  Eyes: Denies blurriness of vision, double vision or watery eyes Ears, nose, mouth, throat, and face: Denies mucositis or sore throat (+) epistaxis upon blowing nose  Respiratory: Denies cough, wheezes (+) SOB upon exertion  Cardiovascular: Denies palpitation, chest discomfort or lower extremity swelling Gastrointestinal:  Denies nausea, heartburn or change in bowel habits Skin: Denies abnormal skin rashes Lymphatics: Denies new lymphadenopathy or easy bruising MSK: (+) Mild lower back pain  Neurological:Denies numbness, tingling or new weaknesses Behavioral/Psych: Mood is stable, no new changes  All other systems were reviewed with the patient and are negative.  PHYSICAL EXAMINATION: ECOG PERFORMANCE STATUS: 1-2 Blood pressure 149/63, pulse 66, respiratory rate 18, temperature 36.4, pulse ox 94% on room air GENERAL:alert, no distress and comfortable SKIN: skin color, texture, turgor are normal, no rashes or significant lesions EYES: normal, conjunctiva are pink and non-injected, sclera clear OROPHARYNX:no exudate, no erythema and lips, buccal mucosa, and tongue normal  NECK: supple, thyroid normal size, non-tender, without nodularity LYMPH:  no palpable lymphadenopathy in the cervical, axillary or inguinal LUNGS: clear to auscultation and percussion with normal breathing effort HEART: regular rate & rhythm and no murmurs and no lower extremity edema ABDOMEN:abdomen soft, non-tender and normal bowel sounds Musculoskeletal:no cyanosis of digits and no clubbing  PSYCH: alert & oriented x 3 with fluent speech NEURO: no focal motor/sensory deficits BREAST: deferred today    LABORATORY DATA:  I have reviewed the data  as listed CBC Latest Ref Rng & Units 08/31/2017 08/17/2017  08/03/2017  WBC 3.9 - 10.3 10e3/uL 19.8(H) 20.9(H) 21.7(H)  Hemoglobin 11.6 - 15.9 g/dL 8.3(L) 8.1(L) 9.9(L)  Hematocrit 34.8 - 46.6 % 24.8(L) 24.6(L) 30.5(L)  Platelets 145 - 400 10e3/uL 201 221 469(H)   CMP Latest Ref Rng & Units 08/31/2017 08/17/2017 08/03/2017  Glucose 70 - 140 mg/dl 105 107 107  BUN 7.0 - 26.0 mg/dL 16.2 16.4 16.0  Creatinine 0.6 - 1.1 mg/dL 1.0 1.2(H) 0.9  Sodium 136 - 145 mEq/L 136 139 141  Potassium 3.5 - 5.1 mEq/L 3.8 4.1 4.1  Chloride 101 - 111 mmol/L - - -  CO2 22 - 29 mEq/L '25 26 26  ' Calcium 8.4 - 10.4 mg/dL 8.8 9.1 9.2  Total Protein 6.4 - 8.3 g/dL 6.1(L) 6.4 6.3(L)  Total Bilirubin 0.20 - 1.20 mg/dL 0.44 0.42 0.41  Alkaline Phos 40 - 150 U/L 79 82 59  AST 5 - 34 U/L '26 22 24  ' ALT 0 - 55 U/L 46 22 25   PATHOLOGY:  Diagnosis 06/10/17 1.) Breast, left, needle core biopsy -INVASIVE DUCTAL CARCINOMA, GRADE 3, WITH FOCAL HIGH GRADE DUCTAL CARCINOMA IN SITU -NEOPLASM INVOLVES ALL CORES AND MEASURES APPROXIMATELY 1.5CM IN MAXIMAL LINEAR DIMENSION -A BREAST PROGNOSTIC PROFILE WILL BE ORDERED ON BLOCK 1A AND SEPARATELY REPORTED   2.) Lymph node, needle/core biopsy, left axilla -METASTATIC CARCINOMA CONSISTENT WITH BREAST PRIMARY    PROCEDURES  ECHO 06/23/17 Study Conclusions - Left ventricle: The cavity size was normal. Wall thickness was   increased in a pattern of moderate LVH. Systolic function was   normal. The estimated ejection fraction was in the range of 60%   to 65%. Wall motion was normal; there were no regional wall   motion abnormalities. Left ventricular diastolic function   parameters were normal. - Mitral valve: Calcified annulus. Mildly thickened leaflets . - Left atrium: The atrium was mildly dilated. - Atrial septum: There was increased thickness of the septum,   consistent with lipomatous hypertrophy. No defect or patent   foramen ovale was identified. - Impressions: Normal  GLS - 18.6 Impressions: - Normal GLS - 18.6   RADIOGRAPHIC STUDIES: I have personally reviewed the radiological images as listed and agreed with the findings in the report.  Diagnostic Mammogram and [place] 08/29/17  IMPRESSION:  Interval decrease in the size of known left 11 o'clock malignancy, with greatest dimension of 1.3cm.  Interval decrease in size of known left malignant axillary lymph node, with cortical thickness of 3 mm.    Ir Cv Line Injection  Result Date: 08/02/2017 INDICATION: Right subclavian port catheter, difficult access, difficult blood return, left breast cancer EXAM: Fluoroscopic injection of the right subclavian port catheter MEDICATIONS: None. ANESTHESIA/SEDATION: None. FLUOROSCOPY TIME:  Fluoroscopy Time: 24 seconds (33 mGy). COMPLICATIONS: None immediate. PROCEDURE: Informed written consent was obtained from the patient after a thorough discussion of the procedural risks, benefits and alternatives. All questions were addressed. Maximal Sterile Barrier Technique was utilized including caps, mask, sterile gowns, sterile gloves, sterile drape, hand hygiene and skin antiseptic. A timeout was performed prior to the initiation of the procedure. Under fluoroscopy and sterile conditions, the right port catheter was accessed. There was difficulty positioning the needle within the port catheter because of the soft tissue depth. Needle was confirmed within the port catheter. Blood was aspirated. Contrast injection confirms patency of the port catheter. Tip is in the proximal SVC. Negative for fiber sheath. IMPRESSION: Right subclavian port catheter is patent.  Tip in the proximal SVC. Electronically Signed  By: Eugenie Filler M.D.   On: 08/02/2017 14:41     [place] Left Breast 06/08/17 IMPRESSION:  Highly suspicious palpable left breast 11 o'clock mass which measures 2.8 cm in greatest dimension. Ductal extension anteriorly to the level of the nipple, and posteriorly for additional 1.8cm, is  suspicious for tumor extension.   ASSESSMENT: BRAYLEA BRANCATO is a wonderful 71 y.o. female who presents today to discuss the ongoing management of her left breast cancer.   1. 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  -We discussed her mammogram, ultrasound findings, and initial biopsy results with patient and her family members in great detail.  -She presented with a palpable left breast mass, measures 2.8 cm on ultrasound, with left axillary lymph node involvement (not palpable on exam). Breast tumor and node biopsy showed triple negative breast ductal carcinoma. -She was seen by surgeon Dr. [name] who recommended left mastectomy with ALND and port placement.  -We discussed her risk of cancer recurrence after complete surgical resection. Due to the relatively large size of her primary tumor, positive lymph nodes, and aggressive nature of her triple negative breast cancer, she is at very high risk of recurrence. -Due to her positive family history of breast cancer, we recommended her to see genetics, to ruled out inheritable breast cancer syndrome.   -Her 06/28/17 CT CAP and bone scan shows no  evidence of metastasis. MRI was not done due to logistics, will be rescheduled at [place]  -She started neoadjuvant AC with onpro on 07/01/17, she experienced significant fatigue, anorexia and some diarrhea and constipation and become febrile once.  She has lost 5 pounds in the past 2 weeks. I reduced her cycle 2 dose by 10%. Due to port dysfunction, cycle 3 was post poned 1 week. She completed 4 cycles AC on 08/07/17  -Ms Couzens was hospitalized 11/24-11/27 for neutropenic fever and diverticulitis after cycle 2 chemotherapy.  She developed skin rash after hospital discharge, likely related to her oral antibiotics Levaquin and or Flagyl.  She has significant leukocytosis, likely related to  her recent Neupogen she received in the hospital.  No clinical suspicion for sepsis or infection.   -Since her breast mass was not palpable on exam, I have ordered a interim ultrasound and mammogram which was done on 08/29/2017, showed significant reduction of the breast mass, from 2.8 cm to 1.3 cm, and decreased adenopathy in the left axilla.  She has had a good response to chemo. -Given the locally advanced triple negative disease, I recommend her to continue chemo with weekly carboplatin and Taxol for a total of 12 weeks.  The benefits and side effects of carboplatin in addition to Taxol was discussed with her, especially the risk of neutropenic fever, anemia needing blood transfusion, etc. she voiced good understanding and agrees to proceed.   -She will start weekly TC today on 08/31/16  -Labs reviewed, Hg at 8.3. I recommend a blood transfusion starting today and in  Few days. She agreed. Labs overall adequate to proceed with TC today  -F/u in 1 week    2. AFIB and HTN -She is currently followed by a cardiologist for ongoing management of this. Not currently on formal anticoagulation therapy.  -She was on 14m ASA daily, but she reports that she recently stopped this in anticipation for her surgery. I encouraged her to start taking this again until her surgeon tell her to  stop taking this.  -Baseline Echo adequate to start chemo  3. Genetics -I strongly encouraged her today to have genetic testing due to her strong family history of cancer. If this is positive, I informed her that her children and grandchildren would need to be tested as well. She would like to proceed with this.  -Plan to see genetics today    4. Diverticulitis requiring hospitalization and IV antibiotics  -Hospitalized 11/24-11/27 and treated with antibiotics Levaquin and or Flagyl -she acquired rash from her antibiotics and stopped the medication.  -Resolved now    PLAN:  -Labs reviewed, HG 8.3. Will do type and cross  today and Blood transfusion 1u today and 1u next week -Labs adequate to proceed with weekly carboplatin and Taxol today  -Lab, f/u and TC in 1 week     No orders of the defined types were placed in this encounter.  This document serves as a record of services personally performed by Truitt Merle, MD. It was created on her behalf by Joslyn Devon, a trained medical scribe. The creation of this record is based on the scribe's personal observations and the provider's statements to them.    I have reviewed the above documentation for accuracy and completeness, and I agree with the above.       Truitt Merle, MD 08/31/2017

## 2017-08-29 DIAGNOSIS — N6489 Other specified disorders of breast: Secondary | ICD-10-CM | POA: Diagnosis not present

## 2017-08-29 DIAGNOSIS — C50212 Malignant neoplasm of upper-inner quadrant of left female breast: Secondary | ICD-10-CM | POA: Diagnosis not present

## 2017-08-29 DIAGNOSIS — R922 Inconclusive mammogram: Secondary | ICD-10-CM | POA: Diagnosis not present

## 2017-08-31 ENCOUNTER — Ambulatory Visit (HOSPITAL_BASED_OUTPATIENT_CLINIC_OR_DEPARTMENT_OTHER): Payer: Medicare Other

## 2017-08-31 ENCOUNTER — Encounter: Payer: Self-pay | Admitting: Hematology

## 2017-08-31 ENCOUNTER — Encounter: Payer: Self-pay | Admitting: Genetics

## 2017-08-31 ENCOUNTER — Telehealth: Payer: Self-pay | Admitting: Genetics

## 2017-08-31 ENCOUNTER — Ambulatory Visit (HOSPITAL_BASED_OUTPATIENT_CLINIC_OR_DEPARTMENT_OTHER): Payer: Medicare Other | Admitting: Hematology

## 2017-08-31 ENCOUNTER — Ambulatory Visit (HOSPITAL_COMMUNITY)
Admission: RE | Admit: 2017-08-31 | Discharge: 2017-08-31 | Disposition: A | Discharge status: Home / Self Care | Payer: Medicare Other | Source: Ambulatory Visit | Attending: Hematology | Admitting: Hematology

## 2017-08-31 ENCOUNTER — Ambulatory Visit (HOSPITAL_BASED_OUTPATIENT_CLINIC_OR_DEPARTMENT_OTHER): Payer: Self-pay | Admitting: Genetics

## 2017-08-31 ENCOUNTER — Other Ambulatory Visit (HOSPITAL_BASED_OUTPATIENT_CLINIC_OR_DEPARTMENT_OTHER): Payer: Medicare Other | Admitting: Genetics

## 2017-08-31 ENCOUNTER — Other Ambulatory Visit: Payer: Medicare Other

## 2017-08-31 VITALS — BP 162/63 | HR 71 | Temp 97.6°F | Resp 18 | Ht 67.0 in | Wt 256.8 lb

## 2017-08-31 DIAGNOSIS — Z171 Estrogen receptor negative status [ER-]: Secondary | ICD-10-CM

## 2017-08-31 DIAGNOSIS — I10 Essential (primary) hypertension: Secondary | ICD-10-CM

## 2017-08-31 DIAGNOSIS — Z860100 Personal history of colon polyps, unspecified: Secondary | ICD-10-CM | POA: Insufficient documentation

## 2017-08-31 DIAGNOSIS — C50212 Malignant neoplasm of upper-inner quadrant of left female breast: Secondary | ICD-10-CM

## 2017-08-31 DIAGNOSIS — I4891 Unspecified atrial fibrillation: Secondary | ICD-10-CM | POA: Diagnosis not present

## 2017-08-31 DIAGNOSIS — Z5111 Encounter for antineoplastic chemotherapy: Secondary | ICD-10-CM

## 2017-08-31 DIAGNOSIS — Z803 Family history of malignant neoplasm of breast: Secondary | ICD-10-CM | POA: Insufficient documentation

## 2017-08-31 DIAGNOSIS — Z85828 Personal history of other malignant neoplasm of skin: Secondary | ICD-10-CM | POA: Insufficient documentation

## 2017-08-31 DIAGNOSIS — C773 Secondary and unspecified malignant neoplasm of axilla and upper limb lymph nodes: Secondary | ICD-10-CM

## 2017-08-31 DIAGNOSIS — Z808 Family history of malignant neoplasm of other organs or systems: Secondary | ICD-10-CM | POA: Insufficient documentation

## 2017-08-31 DIAGNOSIS — Z800 Family history of malignant neoplasm of digestive organs: Secondary | ICD-10-CM | POA: Insufficient documentation

## 2017-08-31 LAB — COMPREHENSIVE METABOLIC PANEL
ALT: 46 U/L (ref 0–55)
AST: 26 U/L (ref 5–34)
Albumin: 3.1 g/dL — ABNORMAL LOW (ref 3.5–5.0)
Alkaline Phosphatase: 79 U/L (ref 40–150)
Anion Gap: 12 mEq/L — ABNORMAL HIGH (ref 3–11)
BILIRUBIN TOTAL: 0.44 mg/dL (ref 0.20–1.20)
BUN: 16.2 mg/dL (ref 7.0–26.0)
CALCIUM: 8.8 mg/dL (ref 8.4–10.4)
CHLORIDE: 100 meq/L (ref 98–109)
CO2: 25 mEq/L (ref 22–29)
CREATININE: 1 mg/dL (ref 0.6–1.1)
EGFR: 57 mL/min/{1.73_m2} — ABNORMAL LOW (ref >60)
Glucose: 105 mg/dl (ref 70–140)
Potassium: 3.8 mEq/L (ref 3.5–5.1)
Sodium: 136 mEq/L (ref 136–145)
TOTAL PROTEIN: 6.1 g/dL — AB (ref 6.4–8.3)

## 2017-08-31 LAB — CBC WITH DIFFERENTIAL/PLATELET
BASO%: 0.3 % (ref 0.0–2.0)
BASOS ABS: 0.1 10*3/uL (ref 0.0–0.1)
EOS ABS: 0 10*3/uL (ref 0.0–0.5)
EOS%: 0.1 % (ref 0.0–7.0)
HCT: 24.8 % — ABNORMAL LOW (ref 34.8–46.6)
HGB: 8.3 g/dL — ABNORMAL LOW (ref 11.6–15.9)
LYMPH%: 3.4 % — AB (ref 14.0–49.7)
MCH: 29.3 pg (ref 25.1–34.0)
MCHC: 33.6 g/dL (ref 31.5–36.0)
MCV: 87.2 fL (ref 79.5–101.0)
MONO#: 2.3 10*3/uL — ABNORMAL HIGH (ref 0.1–0.9)
MONO%: 11.5 % (ref 0.0–14.0)
NEUT%: 84.7 % — ABNORMAL HIGH (ref 38.4–76.8)
NEUTROS ABS: 16.8 10*3/uL — AB (ref 1.5–6.5)
Platelets: 201 10*3/uL (ref 145–400)
RBC: 2.84 10*6/uL — AB (ref 3.70–5.45)
RDW: 14.5 % (ref 11.2–14.5)
WBC: 19.8 10*3/uL — ABNORMAL HIGH (ref 3.9–10.3)
lymph#: 0.7 10*3/uL — ABNORMAL LOW (ref 0.9–3.3)

## 2017-08-31 LAB — PREPARE RBC (CROSSMATCH)

## 2017-08-31 MED ORDER — SODIUM CHLORIDE 0.9 % IV SOLN
80.0000 mg/m2 | Freq: Once | INTRAVENOUS | Status: AC
  Administered 2017-08-31: 192 mg via INTRAVENOUS
  Filled 2017-08-31: qty 32

## 2017-08-31 MED ORDER — FAMOTIDINE IN NACL 20-0.9 MG/50ML-% IV SOLN
INTRAVENOUS | Status: AC
  Filled 2017-08-31: qty 50

## 2017-08-31 MED ORDER — DEXAMETHASONE SODIUM PHOSPHATE 100 MG/10ML IJ SOLN
20.0000 mg | Freq: Once | INTRAMUSCULAR | Status: AC
  Administered 2017-08-31: 20 mg via INTRAVENOUS
  Filled 2017-08-31: qty 2

## 2017-08-31 MED ORDER — SODIUM CHLORIDE 0.9% FLUSH
10.0000 mL | INTRAVENOUS | Status: DC | PRN
  Administered 2017-08-31: 10 mL
  Filled 2017-08-31: qty 10

## 2017-08-31 MED ORDER — FAMOTIDINE IN NACL 20-0.9 MG/50ML-% IV SOLN
20.0000 mg | Freq: Once | INTRAVENOUS | Status: AC
  Administered 2017-08-31: 20 mg via INTRAVENOUS

## 2017-08-31 MED ORDER — DIPHENHYDRAMINE HCL 50 MG/ML IJ SOLN
50.0000 mg | Freq: Once | INTRAMUSCULAR | Status: AC
  Administered 2017-08-31: 50 mg via INTRAVENOUS

## 2017-08-31 MED ORDER — DIPHENHYDRAMINE HCL 50 MG/ML IJ SOLN
INTRAMUSCULAR | Status: AC
  Filled 2017-08-31: qty 1

## 2017-08-31 MED ORDER — SODIUM CHLORIDE 0.9 % IV SOLN
Freq: Once | INTRAVENOUS | Status: AC
Start: 2017-08-31 — End: 2017-08-31
  Administered 2017-08-31: 08:00:00 via INTRAVENOUS

## 2017-08-31 MED ORDER — PALONOSETRON HCL INJECTION 0.25 MG/5ML
0.2500 mg | Freq: Once | INTRAVENOUS | Status: AC
  Administered 2017-08-31: 0.25 mg via INTRAVENOUS

## 2017-08-31 MED ORDER — PALONOSETRON HCL INJECTION 0.25 MG/5ML
INTRAVENOUS | Status: AC
  Filled 2017-08-31: qty 5

## 2017-08-31 MED ORDER — HEPARIN SOD (PORK) LOCK FLUSH 100 UNIT/ML IV SOLN
500.0000 [IU] | Freq: Once | INTRAVENOUS | Status: AC | PRN
  Administered 2017-08-31: 500 [IU]
  Filled 2017-08-31: qty 5

## 2017-08-31 MED ORDER — CARBOPLATIN CHEMO INJECTION 450 MG/45ML
245.0000 mg | Freq: Once | INTRAVENOUS | Status: AC
  Administered 2017-08-31: 250 mg via INTRAVENOUS
  Filled 2017-08-31: qty 25

## 2017-08-31 MED ORDER — SODIUM CHLORIDE 0.9 % IV SOLN
250.0000 mL | Freq: Once | INTRAVENOUS | Status: AC
  Administered 2017-08-31: 250 mL via INTRAVENOUS

## 2017-08-31 NOTE — Progress Notes (Addendum)
REFERRING PROVIDER: Truitt Merle, MD [address] [place], [place] [address]  PRIMARY PROVIDER:  Ronita Hipps, MD  PRIMARY REASON FOR VISIT:  1. Malignant neoplasm of upper-inner quadrant of left breast in female, estrogen receptor negative ([place])   2. Family history of breast cancer   3. Family history of colon cancer   4. Family history of pancreatic cancer   5. Family history of melanoma     HISTORY OF PRESENT ILLNESS:   Lindsay [name], a 72 y.o. female, was seen for a [facility] cancer genetics consultation at the request of Dr. Burr Medico due to a personal and family history of cancer.  Lindsay [name] presents to clinic today to discuss the possibility of a hereditary predisposition to cancer, genetic testing, and to further clarify her future cancer risks, as well as potential cancer risks for family members.   In Oct. 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 She is currently undergoing neoadjuvant chemotherapy that will be followed by mastectomy.  She reports a history of several Basal Cell skin cancers and a history of sun exposure.   CANCER HISTORY:  Oncology History   Cancer Staging Malignant neoplasm of upper-inner quadrant of left breast in female, estrogen receptor negative ([place]) Staging form: Breast, AJCC 8th Edition - Clinical stage from 06/10/2017: Stage IIIB (cT2, cN1, cM0, G3, ER: Negative, PR: Negative, HER2: Negative) - Signed by Truitt Merle, MD on 06/21/2017       Malignant neoplasm of upper-inner quadrant of left breast in female, estrogen receptor negative ([place])   06/08/2017 Imaging    DIAGNOSTIC MAMMO and [place] Left Breast 06/08/17 IMPRESSION:  Highly suspicious palpable left breast 11 o'clock mass which measures 2.8 cm in greatest dimension. Ductal extension anteriorly to the level of the nipple, and posteriorly for additional 1.8cm, is suspicious for tumor extension.       06/10/2017 Initial  Diagnosis    Malignant neoplasm of upper-inner quadrant of left breast in female, estrogen receptor negative ([place])      06/10/2017 Pathology Results    Diagnosis 06/10/17 1.) Breast, left, needle core biopsy -INVASIVE DUCTAL CARCINOMA, GRADE 3, WITH FOCAL HIGH GRADE DUCTAL CARCINOMA IN SITU -NEOPLASM INVOLVES ALL CORES AND MEASURES APPROXIMATELY 1.5CM IN MAXIMAL LINEAR DIMENSION  2.) Lymph node, needle/core biopsy, left axilla -METASTATIC CARCINOMA CONSISTENT WITH BREAST PRIMARY       06/10/2017 Receptors her2    ER: -, PR-, HER2 not amplified       06/23/2017 Echocardiogram    ECHO 06/23/17 Study Conclusions - Left ventricle: The cavity size was normal. Wall thickness was   increased in a pattern of moderate LVH. Systolic function was   normal. The estimated ejection fraction was in the range of 60%   to 65%. Wall motion was normal; there were no regional wall   motion abnormalities. Left ventricular diastolic function   parameters were normal. - Mitral valve: Calcified annulus. Mildly thickened leaflets . - Left atrium: The atrium was mildly dilated. - Atrial septum: There was increased thickness of the septum,   consistent with lipomatous hypertrophy. No defect or patent   foramen ovale was identified. - Impressions: Normal GLS - 18.6 Impressions: - Normal GLS - 18.6      06/28/2017 Imaging    CT CAP W Contrast 06/28/17 IMPRESSION: 1. Mildly enlarged left axillary lymph node at 1.5 cm, a significant change from the prior chest CT from 2015, suspicious for  left axillary metastatic disease. 2. Left upper breast lesion 2.1 cm in diameter with higher density margins and lower density center, favoring lumpectomy site. 3. Mildly enlarged right hilar lymph node, but stable compared back through 2015. 4. No findings of osseous metastatic disease or additional metastatic sites. 5. Other imaging findings of potential clinical significance: Aberrant right subclavian  artery passes behind the esophagus. Aortic Atherosclerosis (ICD10-I70.0) and Emphysema (ICD10-J43.9). Coronary atherosclerosis. Mild nodular enlargement of the left inferior thyroid lobe, isodense to the rest of the thyroid. Airway thickening is present, suggesting bronchitis or reactive airways disease. Left hepatic lobe cyst. Left renal cyst. Likely small cysts in the right kidney. Sigmoid colon diverticulosis.       06/28/2017 Imaging    Bone Scan 06/28/17 IMPRESSION: No evidence of osseous metastatic disease. Subtle increased radiotracer uptake within the left chest wall, likely within the left breast.      06/30/2017 Surgery    Port placement by Dr. [name] on 06/30/17      07/01/2017 -  Chemotherapy    Neoadjuvant Adriamycin and Cytoxan (AC) every 2 weeks for 4 cycles starting 07/01/17. Dose reduced starting with cycle 2. Compelted AC on 12.19.18. Started weekly Carbo and Taxol every on 08/31/17        07/23/2017 - 07/26/2017 Hospital Admission    Admit date: 07/23/17 Admission diagnosis: 07/26/17 Additional comments: febrile neutropenia; diverticulitis      08/29/2017 Mammogram    Diagnostic Mammogram and [place] 08/29/17  IMPRESSION:  Interval decrease in the size of known left 11 o'clock malignancy, with greatest dimension of 1.3cm.  Interval decrease in size of known left malignant axillary lymph node, with cortical thickness of 3 mm.          HORMONAL RISK FACTORS:  Menarche was at age 67.  First live birth at age 62.  OCP use for approximately yes, in youth years.  Ovaries intact: yes.  Hysterectomy: no.  Menopausal status: postmenopausal. 21 years ago HRT use: 0 years. Colonoscopy: yes; 2016- tubular adenomas. Pt. Reports one time they found 8, but they were benign.  She goes every 5 years for colonoscopies.  Mammogram within the last year: yes. History of smoking for 50 years; she averaged 2ppd.  She quit 3 years ago.   Past Medical History:  Diagnosis  Date  . Breast cancer ([place])   . Cancer [facility])    left breast cancer  . Dysrhythmia    PACs and nonsustained runs of atrial tach by Holter 10/13/16  . Family history of breast cancer   . Family history of colon cancer   . Family history of melanoma   . Family history of pancreatic cancer   . History of kidney stones   . Hypertension     Past Surgical History:  Procedure Laterality Date  . COLONOSCOPY    . FOOT SURGERY Right   . IR CV LINE INJECTION  08/02/2017  . left breast lumpectomy  2000  . PORTACATH PLACEMENT Right 06/30/2017   Procedure: [place];  Surgeon: Coralie Keens, MD;  Location: [facility];  Service: General;  Laterality: Right;    Social History   Socioeconomic History  . Marital status: Married    Spouse name: Not on file  . Number of children: Not on file  . Years of education: Not on file  . Highest education level: Not on file  Social Needs  . Financial resource strain: Not on file  . Food insecurity - worry: Not on file  .  Food insecurity - inability: Not on file  . Transportation needs - medical: Not on file  . Transportation needs - non-medical: Not on file  Occupational History  . Not on file  Tobacco Use  . Smoking status: Former Smoker    Packs/day: 2.00    Years: 50.00    Pack years: 100.00    Last attempt to quit: 06/02/2014    Years since quitting: 3.2  . Smokeless tobacco: Never Used  Substance and Sexual Activity  . Alcohol use: No  . Drug use: No  . Sexual activity: Yes  Other Topics Concern  . Not on file  Social History Narrative  . Not on file     FAMILY HISTORY:  We obtained a detailed, 4-generation family history.  Significant diagnoses are listed below: Family History  Problem Relation Age of Onset  . Pancreatic cancer Mother 48  . Breast cancer Sister 7  . Breast cancer Paternal Aunt   . Breast cancer Sister 64  . Melanoma Sister   . Colon cancer Sister 80   Lindsay [name] has 2 sons ages 21 and  5 with no history of cancer.  Lindsay [name] has 4 sisters and 8 brothers listed below: -1 sister was diagnosed with breast cancer at 14 and had a bilateral mastectomy.  She has also had several BCC skin cancers.  She has her ovaries and uterus intact.  This sister has 3 daughters with no history of cancer.  -1 sister developed breast cancer in her 63's and died at 31.  She had 3 sons with no history of cancer.  -1 sister died of colon cancer diagnosed at 34.  She had 2 daughters and 1 son with no history of cancer.  She is an identical twin to the sister with melanoma.   -1 sister had melanoma in her 48's and is now deceased.  She is an identical twin to the sister with colon cancer. She had 2 daughters and 1 son with no history of cancer.  -1 brothers with no history of cancer.  -2 brothers had part of their intestines removed, but she does not think it was due to cancer.  One of these brothers is still living, the other deceased due to heart problems.   Lindsay [name]'s father died at 75 due to a heart attack. He had as sister (patient's aunt) with breast cancer in her 65's.  This aunt had a son and a daughter with no history of cancer. Lindsay [name] has several other paternal aunts/uncles with no history of cancer.  Ms. Viswanathan does not know any information about her paternal grandfather as he died before she was born, and her paternal grandmother died in her 70's with no history of cancer.   Lindsay [name] mother was diagnosed with pancreatic cancer at 82.  She had her uterus and ovaries intact when she died.  Lindsay [name] has 4 maternal aunts who had no history of cancer.  Lindsay [name] does not know any information about her maternal grandparents as they died before she was born.   Lindsay [name] is unaware of previous family history of genetic testing for hereditary cancer risks. Patient's maternal ancestors are of Caucasian descent, and paternal ancestors are of Caucasian descent. There is no reported Ashkenazi Jewish  ancestry. There is no known consanguinity.  GENETIC COUNSELING ASSESSMENT: TRYSTYN SITTS is a 72 y.o. female with a personal and family history which is somewhat suggestive of a Hereditary Cancer Predisposition Syndrome. We, therefore, discussed  and recommended the following at today's visit.   DISCUSSION: We reviewed the characteristics, features and inheritance patterns of hereditary cancer syndromes. We also discussed genetic testing, including the appropriate family members to test, the process of testing, insurance coverage and turn-around-time for results. We discussed the implications of a negative, positive and/or variant of uncertain significant result. We recommended Lindsay [name] pursue genetic testing for the Common Hereditary Cancer gene panel + Melanoma Panel + Basal Cell Nevus Panel + Pancreatic Panel.  [facility] Basal Cell Nevus Syndrome Panel (2)  Genes: PTCH1, SUFU, PTCH2  [facility] Common Hereditary Cancers Panel (47)  Genes APC, ATM, AXIN2, BARD1, BMPR1A, BRCA1, BRCA2, BRIP1, CDH1, CDK4, CDKN2A, CHEK2, CTNNA1, DICER1, EPCAM, GREM1, HOXB13, KIT, MEN1, MLH1, MSH2, MSH3, MSH6, MUTYH, NBN, NF1, NTHL1, PALB2, PDGFRA, PMS2, POLD1, POLE, PTEN, RAD50, RAD51C, RAD51D, SDHA, SDHB, SDHC, SDHD, SMAD4, SMARCA4, STK11, TP53, TSC1, TSC2, VHL  [facility] Melanoma Panel (9)  Genes BAP1, BRCA2, CDK4, CDKN2A, MITF, POT1, PTEN, RB1, TP53, BRCA1, MC1R, TERT  [facility] Pancreatic Cancer Panel (20)  Genes APC, ATM, BMPR1A, BRCA1, BRCA2, CDKN2A, EPCAM, MEN1, MLH1, MSH2, MSH6, NF1, PALB2, PMS2, SMAD4, STK11, TP53, TSC1, TSC2, VHL CDK4, FANCC, PALLD  We discussed that only 5-10% of cancers are associated with a Hereditary cancer predisposition syndrome.  One of the most common hereditary cancer syndromes that increases breast cancer risk is called Hereditary Breast and Ovarian Cancer (HBOC) syndrome.  This syndrome is caused by mutations in the BRCA1 and BRCA2 genes.  This syndrome increases an individual's  lifetime risk to develop breast, ovarian, pancreatic, and other types of cancer.  There are also many other cancer predisposition syndromes caused by mutations in several other genes.  We discussed that if she is found to have a mutation in one of these genes, it may impact surgical decisions, and alter future medical management recommendations such as increased cancer screenings and consideration of risk reducing surgeries.  A positive result could also have implications for the patient's family members.  The results of this testing may also help Lindsay [name]'s oncologist determine the most appropriate treatment options for her.   A Negative result would mean we were unable to identify a hereditary component to her cancer, but does not rule out the possibility of a hereditary basis for her cancer.  There could be mutations that are undetectable by current technology, or in genes not yet tested or identified to increase cancer risk.    We discussed the potential to find a Variant of Uncertain Significance or VUS.  These are variants that have not yet been identified as pathogenic or benign, and it is unknown if this variant is associated with increased cancer risk or if this is a normal finding.  Most VUS's are reclassified to benign or likely benign.   It should not be used to make medical management decisions. With time, we suspect the lab will determine the significance of any VUS's identified if any.   Based on Lindsay [name]'s personal and family history of cancer, she meets medical criteria for genetic testing. Despite that she meets criteria, she may still have an out of pocket cost. We discussed that if her out of pocket cost for testing is over $100, the laboratory will call and confirm whether she wants to proceed with testing.  If the out of pocket cost of testing is less than $100 she will be billed by the genetic testing laboratory.  Patients with medicare insurance typically have $0 out of pocket cost.  PLAN: After considering the risks, benefits, and limitations, Lindsay [name]  provided informed consent to pursue genetic testing and the saliva sample was sent to [facility] for analysis of the Common Hereditary Cancer Panel + Melanoma Panel. Results should be available within approximately 2-3 weeks' time, at which point they will be disclosed by telephone to Lindsay [name], as will any additional recommendations warranted by these results. Lindsay [name] will receive a summary of her genetic counseling visit and a copy of her results once available. This information will also be available in Epic. We encouraged Lindsay [name] to remain in contact with cancer genetics annually so that we can continuously update the family history and inform her of any changes in cancer genetics and testing that may be of benefit for her family. Ms. Callanan's questions were answered to her satisfaction today. Our contact information was provided should additional questions or concerns arise.  Based on Ms. Cipollone's family history, we recommended her siblings and maternal relatives, especially those affected with cancer have genetic counseling and testing. Ms. Bollig will let us know if we can be of any assistance in coordinating genetic counseling and/or testing for this family member.   Lastly, we encouraged Ms. Marriott to remain in contact with cancer genetics annually so that we can continuously update the family history and inform her of any changes in cancer genetics and testing that may be of benefit for this family.   Ms.  Dawes's questions were answered to her satisfaction today. Our contact information was provided should additional questions or concerns arise. Thank you for the referral and allowing [place] to share in the care of your patient.   Tana Felts, MS Genetic Counselor [email or web address] phone: [phone]  The patient was seen for a total of 30 minutes in face-to-face genetic counseling.  The  patient was accompanied today by her sister.  This patient was discussed with Drs. Magrinat, Lindi Adie and/or Burr Medico who agrees with the above.

## 2017-08-31 NOTE — Patient Instructions (Addendum)
[facility] Discharge Instructions for Patients Receiving Chemotherapy  Today you received the following chemotherapy agents: Paclitaxel (Taxol) and Carboplatin (Paraplatin).  To help prevent nausea and vomiting after your treatment, we encourage you to take your nausea medication as prescribed. Received Aloxi during treatment-->take Compazine for next 3 days If you develop nausea and vomiting that is not controlled by your nausea medication, call the clinic.   BELOW ARE SYMPTOMS THAT SHOULD BE REPORTED IMMEDIATELY:  *FEVER GREATER THAN 100.5 F  *CHILLS WITH OR WITHOUT FEVER  NAUSEA AND VOMITING THAT IS NOT CONTROLLED WITH YOUR NAUSEA MEDICATION  *UNUSUAL SHORTNESS OF BREATH  *UNUSUAL BRUISING OR BLEEDING  TENDERNESS IN MOUTH AND THROAT WITH OR WITHOUT PRESENCE OF ULCERS  *URINARY PROBLEMS  *BOWEL PROBLEMS  UNUSUAL RASH Items with * indicate a potential emergency and should be followed up as soon as possible.  Feel free to call the clinic should you have any questions or concerns. The clinic phone number is [phone].  Please show the [facility] at check-in to the Emergency Department and triage nurse.  Carboplatin injection What is this medicine? CARBOPLATIN (KAR boe pla tin) is a chemotherapy drug. It targets fast dividing cells, like cancer cells, and causes these cells to die. This medicine is used to treat ovarian cancer and many other cancers. This medicine may be used for other purposes; ask your health care provider or pharmacist if you have questions. COMMON BRAND NAME(S): Paraplatin What should I tell my health care provider before I take this medicine? They need to know if you have any of these conditions: -blood disorders -hearing problems -kidney disease -recent or ongoing radiation therapy -an unusual or allergic reaction to carboplatin, cisplatin, other chemotherapy, other medicines, foods, dyes, or preservatives -pregnant or trying  to get pregnant -breast-feeding How should I use this medicine? This drug is usually given as an infusion into a vein. It is administered in a hospital or clinic by a specially trained health care professional. Talk to your pediatrician regarding the use of this medicine in children. Special care may be needed. Overdosage: If you think you have taken too much of this medicine contact a poison control center or emergency room at once. NOTE: This medicine is only for you. Do not share this medicine with others. What if I miss a dose? It is important not to miss a dose. Call your doctor or health care professional if you are unable to keep an appointment. What may interact with this medicine? -medicines for seizures -medicines to increase blood counts like filgrastim, pegfilgrastim, sargramostim -some antibiotics like amikacin, gentamicin, neomycin, streptomycin, tobramycin -vaccines Talk to your doctor or health care professional before taking any of these medicines: -acetaminophen -aspirin -ibuprofen -ketoprofen -naproxen This list may not describe all possible interactions. Give your health care provider a list of all the medicines, herbs, non-prescription drugs, or dietary supplements you use. Also tell them if you smoke, drink alcohol, or use illegal drugs. Some items may interact with your medicine. What should I watch for while using this medicine? Your condition will be monitored carefully while you are receiving this medicine. You will need important blood work done while you are taking this medicine. This drug may make you feel generally unwell. This is not uncommon, as chemotherapy can affect healthy cells as well as cancer cells. Report any side effects. Continue your course of treatment even though you feel ill unless your doctor tells you to stop. In some cases, you may  be given additional medicines to help with side effects. Follow all directions for their use. Call your doctor  or health care professional for advice if you get a fever, chills or sore throat, or other symptoms of a cold or flu. Do not treat yourself. This drug decreases your body's ability to fight infections. Try to avoid being around people who are sick. This medicine may increase your risk to bruise or bleed. Call your doctor or health care professional if you notice any unusual bleeding. Be careful brushing and flossing your teeth or using a toothpick because you may get an infection or bleed more easily. If you have any dental work done, tell your dentist you are receiving this medicine. Avoid taking products that contain aspirin, acetaminophen, ibuprofen, naproxen, or ketoprofen unless instructed by your doctor. These medicines may hide a fever. Do not become pregnant while taking this medicine. Women should inform their doctor if they wish to become pregnant or think they might be pregnant. There is a potential for serious side effects to an unborn child. Talk to your health care professional or pharmacist for more information. Do not breast-feed an infant while taking this medicine. What side effects may I notice from receiving this medicine? Side effects that you should report to your doctor or health care professional as soon as possible: -allergic reactions like skin rash, itching or hives, swelling of the face, lips, or tongue -signs of infection - fever or chills, cough, sore throat, pain or difficulty passing urine -signs of decreased platelets or bleeding - bruising, pinpoint red spots on the skin, black, tarry stools, nosebleeds -signs of decreased red blood cells - unusually weak or tired, fainting spells, lightheadedness -breathing problems -changes in hearing -changes in vision -chest pain -high blood pressure -low blood counts - This drug may decrease the number of white blood cells, red blood cells and platelets. You may be at increased risk for infections and bleeding. -nausea and  vomiting -pain, swelling, redness or irritation at the injection site -pain, tingling, numbness in the hands or feet -problems with balance, talking, walking -trouble passing urine or change in the amount of urine Side effects that usually do not require medical attention (report to your doctor or health care professional if they continue or are bothersome): -hair loss -loss of appetite -metallic taste in the mouth or changes in taste This list may not describe all possible side effects. Call your doctor for medical advice about side effects. You may report side effects to FDA at 1-800-FDA-1088. Where should I keep my medicine? This drug is given in a hospital or clinic and will not be stored at home. NOTE: This sheet is a summary. It may not cover all possible information. If you have questions about this medicine, talk to your doctor, pharmacist, or health care provider.  2018 Elsevier/Gold Standard (2007-11-21 14:38:05)  Paclitaxel injection What is this medicine? PACLITAXEL (PAK li TAX el) is a chemotherapy drug. It targets fast dividing cells, like cancer cells, and causes these cells to die. This medicine is used to treat ovarian cancer, breast cancer, and other cancers. This medicine may be used for other purposes; ask your health care provider or pharmacist if you have questions. COMMON BRAND NAME(S): Onxol, Taxol What should I tell my health care provider before I take this medicine? They need to know if you have any of these conditions: -blood disorders -irregular heartbeat -infection (especially a virus infection such as chickenpox, cold sores, or herpes) -liver disease -previous  or ongoing radiation therapy -an unusual or allergic reaction to paclitaxel, alcohol, polyoxyethylated castor oil, other chemotherapy agents, other medicines, foods, dyes, or preservatives -pregnant or trying to get pregnant -breast-feeding How should I use this medicine? This drug is given as an  infusion into a vein. It is administered in a hospital or clinic by a specially trained health care professional. Talk to your pediatrician regarding the use of this medicine in children. Special care may be needed. Overdosage: If you think you have taken too much of this medicine contact a poison control center or emergency room at once. NOTE: This medicine is only for you. Do not share this medicine with others. What if I miss a dose? It is important not to miss your dose. Call your doctor or health care professional if you are unable to keep an appointment. What may interact with this medicine? Do not take this medicine with any of the following medications: -disulfiram -metronidazole This medicine may also interact with the following medications: -cyclosporine -diazepam -ketoconazole -medicines to increase blood counts like filgrastim, pegfilgrastim, sargramostim -other chemotherapy drugs like cisplatin, doxorubicin, epirubicin, etoposide, teniposide, vincristine -quinidine -testosterone -vaccines -verapamil Talk to your doctor or health care professional before taking any of these medicines: -acetaminophen -aspirin -ibuprofen -ketoprofen -naproxen This list may not describe all possible interactions. Give your health care provider a list of all the medicines, herbs, non-prescription drugs, or dietary supplements you use. Also tell them if you smoke, drink alcohol, or use illegal drugs. Some items may interact with your medicine. What should I watch for while using this medicine? Your condition will be monitored carefully while you are receiving this medicine. You will need important blood work done while you are taking this medicine. This medicine can cause serious allergic reactions. To reduce your risk you will need to take other medicine(s) before treatment with this medicine. If you experience allergic reactions like skin rash, itching or hives, swelling of the face, lips, or  tongue, tell your doctor or health care professional right away. In some cases, you may be given additional medicines to help with side effects. Follow all directions for their use. This drug may make you feel generally unwell. This is not uncommon, as chemotherapy can affect healthy cells as well as cancer cells. Report any side effects. Continue your course of treatment even though you feel ill unless your doctor tells you to stop. Call your doctor or health care professional for advice if you get a fever, chills or sore throat, or other symptoms of a cold or flu. Do not treat yourself. This drug decreases your body's ability to fight infections. Try to avoid being around people who are sick. This medicine may increase your risk to bruise or bleed. Call your doctor or health care professional if you notice any unusual bleeding. Be careful brushing and flossing your teeth or using a toothpick because you may get an infection or bleed more easily. If you have any dental work done, tell your dentist you are receiving this medicine. Avoid taking products that contain aspirin, acetaminophen, ibuprofen, naproxen, or ketoprofen unless instructed by your doctor. These medicines may hide a fever. Do not become pregnant while taking this medicine. Women should inform their doctor if they wish to become pregnant or think they might be pregnant. There is a potential for serious side effects to an unborn child. Talk to your health care professional or pharmacist for more information. Do not breast-feed an infant while taking this medicine. Men  are advised not to father a child while receiving this medicine. This product may contain alcohol. Ask your pharmacist or healthcare provider if this medicine contains alcohol. Be sure to tell all healthcare providers you are taking this medicine. Certain medicines, like metronidazole and disulfiram, can cause an unpleasant reaction when taken with alcohol. The reaction includes  flushing, headache, nausea, vomiting, sweating, and increased thirst. The reaction can last from 30 minutes to several hours. What side effects may I notice from receiving this medicine? Side effects that you should report to your doctor or health care professional as soon as possible: -allergic reactions like skin rash, itching or hives, swelling of the face, lips, or tongue -low blood counts - This drug may decrease the number of white blood cells, red blood cells and platelets. You may be at increased risk for infections and bleeding. -signs of infection - fever or chills, cough, sore throat, pain or difficulty passing urine -signs of decreased platelets or bleeding - bruising, pinpoint red spots on the skin, black, tarry stools, nosebleeds -signs of decreased red blood cells - unusually weak or tired, fainting spells, lightheadedness -breathing problems -chest pain -high or low blood pressure -mouth sores -nausea and vomiting -pain, swelling, redness or irritation at the injection site -pain, tingling, numbness in the hands or feet -slow or irregular heartbeat -swelling of the ankle, feet, hands Side effects that usually do not require medical attention (report to your doctor or health care professional if they continue or are bothersome): -bone pain -complete hair loss including hair on your head, underarms, pubic hair, eyebrows, and eyelashes -changes in the color of fingernails -diarrhea -loosening of the fingernails -loss of appetite -muscle or joint pain -red flush to skin -sweating This list may not describe all possible side effects. Call your doctor for medical advice about side effects. You may report side effects to FDA at 1-800-FDA-1088. Where should I keep my medicine? This drug is given in a hospital or clinic and will not be stored at home. NOTE: This sheet is a summary. It may not cover all possible information. If you have questions about this medicine, talk to your  doctor, pharmacist, or health care provider.  2018 Elsevier/Gold Standard (2015-06-17 19:58:00)   Blood Transfusion, Care After This sheet gives you information about how to care for yourself after your procedure. Your doctor may also give you more specific instructions. If you have problems or questions, contact your doctor. Follow these instructions at home:  Take over-the-counter and prescription medicines only as told by your doctor.  Go back to your normal activities as told by your doctor.  Follow instructions from your doctor about how to take care of the area where an IV tube was put into your vein (insertion site). Make sure you: ? Wash your hands with soap and water before you change your bandage (dressing). If there is no soap and water, use hand sanitizer. ? Change your bandage as told by your doctor.  Check your IV insertion site every day for signs of infection. Check for: ? More redness, swelling, or pain. ? More fluid or blood. ? Warmth. ? Pus or a bad smell. Contact a doctor if:  You have more redness, swelling, or pain around the IV insertion site..  You have more fluid or blood coming from the IV insertion site.  Your IV insertion site feels warm to the touch.  You have pus or a bad smell coming from the IV insertion site.  Your pee (  urine) turns pink, red, or brown.  You feel weak after doing your normal activities. Get help right away if:  You have signs of a serious allergic or body defense (immune) system reaction, including: ? Itchiness. ? Hives. ? Trouble breathing. ? Anxiety. ? Pain in your chest or lower back. ? Fever, flushing, and chills. ? Fast pulse. ? Rash. ? Watery poop (diarrhea). ? Throwing up (vomiting). ? Dark pee. ? Serious headache. ? Dizziness. ? Stiff neck. ? Yellow color in your face or the white parts of your eyes (jaundice). Summary  After a blood transfusion, return to your normal activities as told by your  doctor.  Every day, check for signs of infection where the IV tube was put into your vein.  Some signs of infection are warm skin, more redness and pain, more fluid or blood, and pus or a bad smell where the needle went in.  Contact your doctor if you feel weak or have any unusual symptoms. This information is not intended to replace advice given to you by your health care provider. Make sure you discuss any questions you have with your health care provider. Document Released: 09/06/2014 Document Revised: 04/09/2016 Document Reviewed: 04/09/2016 Elsevier Interactive Patient Education  2017 [employer].     Blood Transfusion, Adult A blood transfusion is a procedure in which you receive donated blood, including plasma, platelets, and red blood cells, through an IV tube. You may need a blood transfusion because of illness, surgery, or injury. The blood may come from a donor. You may also be able to donate blood for yourself (autologous blood donation) before a surgery if you know that you might require a blood transfusion. The blood given in a transfusion is made up of different types of cells. You may receive:  Red blood cells. These carry oxygen to the cells in the body.  White blood cells. These help you fight infections.  Platelets. These help your blood to clot.  Plasma. This is the liquid part of your blood and it helps with fluid imbalances.  If you have hemophilia or another clotting disorder, you may also receive other types of blood products. Tell a health care provider about:  Any allergies you have.  All medicines you are taking, including vitamins, herbs, eye drops, creams, and over-the-counter medicines.  Any problems you or family members have had with anesthetic medicines.  Any blood disorders you have.  Any surgeries you have had.  Any medical conditions you have, including any recent fever or cold symptoms.  Whether you are pregnant or may be  pregnant.  Any previous reactions you have had during a blood transfusion. What are the risks? Generally, this is a safe procedure. However, problems may occur, including:  Having an allergic reaction to something in the donated blood. Hives and itching may be symptoms of this type of reaction.  Fever. This may be a reaction to the white blood cells in the transfused blood. Nausea or chest pain may accompany a fever.  Iron overload. This can happen from having many transfusions.  Transfusion-related acute lung injury (TRALI). This is a rare reaction that causes lung damage. The cause is not known.TRALI can occur within hours of a transfusion or several days later.  Sudden (acute) or delayed hemolytic reactions. This happens if your blood does not match the cells in your transfusion. Your body's defense system (immune system) may try to attack the new cells. This complication is rare. The symptoms include fever, chills, nausea,  and low back pain or chest pain.  Infection or disease transmission. This is rare.  What happens before the procedure?  You will have a blood test to determine your blood type. This is necessary to know what kind of blood your body will accept and to match it to the donor blood.  If you are going to have a planned surgery, you may be able to do an autologous blood donation. This may be done in case you need to have a transfusion.  If you have had an allergic reaction to a transfusion in the past, you may be given medicine to help prevent a reaction. This medicine may be given to you by mouth or through an IV tube.  You will have your temperature, blood pressure, and pulse monitored before the transfusion.  Follow instructions from your health care provider about eating and drinking restrictions.  Ask your health care provider about: ? Changing or stopping your regular medicines. This is especially important if you are taking diabetes medicines or blood  thinners. ? Taking medicines such as aspirin and ibuprofen. These medicines can thin your blood. Do not take these medicines before your procedure if your health care provider instructs you not to. What happens during the procedure?  An IV tube will be inserted into one of your veins.  The bag of donated blood will be attached to your IV tube. The blood will then enter through your vein.  Your temperature, blood pressure, and pulse will be monitored regularly during the transfusion. This monitoring is done to detect early signs of a transfusion reaction.  If you have any signs or symptoms of a reaction, your transfusion will be stopped and you may be given medicine.  When the transfusion is complete, your IV tube will be removed.  Pressure may be applied to the IV site for a few minutes.  A bandage (dressing) will be applied. The procedure may vary among health care providers and hospitals. What happens after the procedure?  Your temperature, blood pressure, heart rate, breathing rate, and blood oxygen level will be monitored often.  Your blood may be tested to see how you are responding to the transfusion.  You may be warmed with fluids or blankets to maintain a normal body temperature. Summary  A blood transfusion is a procedure in which you receive donated blood, including plasma, platelets, and red blood cells, through an IV tube.  Your temperature, blood pressure, and pulse will be monitored before, during, and after the transfusion.  Your blood may be tested after the transfusion to see how your body has responded. This information is not intended to replace advice given to you by your health care provider. Make sure you discuss any questions you have with your health care provider. Document Released: 08/13/2000 Document Revised: 05/13/2016 Document Reviewed: 05/13/2016 Elsevier Interactive Patient Education  [employer].

## 2017-08-31 NOTE — Progress Notes (Signed)
Added Aloxi since MD added in [place].  Moderately emetogenic.  Amy E, RN in infusion aware order added. Kennith [place], Pharm.D., CPP 08/31/2017@9 :14 AM

## 2017-08-31 NOTE — Telephone Encounter (Signed)
Left message saying I was calling to see where she was at in her appointments for today.  I provided directions for how to get to the genetics offices and my contact info if she needed to reach me.

## 2017-09-01 LAB — TYPE AND SCREEN
ABO/RH(D): A POS
Antibody Screen: NEGATIVE
UNIT DIVISION: 0

## 2017-09-01 LAB — BPAM RBC
BLOOD PRODUCT EXPIRATION DATE: 201901252359
ISSUE DATE / TIME: 201901021256
Unit Type and Rh: 6200

## 2017-09-02 ENCOUNTER — Telehealth: Payer: Self-pay | Admitting: Hematology

## 2017-09-02 NOTE — Telephone Encounter (Signed)
Spoke with patient re next appointment for 1/9 @ 7:45am. Patient to get new schedule at next visit.

## 2017-09-07 ENCOUNTER — Inpatient Hospital Stay (HOSPITAL_BASED_OUTPATIENT_CLINIC_OR_DEPARTMENT_OTHER): Payer: Medicare Other | Admitting: Hematology

## 2017-09-07 ENCOUNTER — Encounter: Payer: Self-pay | Admitting: Hematology

## 2017-09-07 ENCOUNTER — Inpatient Hospital Stay: Payer: Medicare Other

## 2017-09-07 ENCOUNTER — Telehealth: Payer: Self-pay | Admitting: Hematology

## 2017-09-07 ENCOUNTER — Inpatient Hospital Stay: Payer: Medicare Other | Attending: Hematology

## 2017-09-07 VITALS — BP 161/77 | HR 76 | Temp 97.6°F | Resp 22 | Ht 67.0 in | Wt 255.7 lb

## 2017-09-07 DIAGNOSIS — R7989 Other specified abnormal findings of blood chemistry: Secondary | ICD-10-CM | POA: Insufficient documentation

## 2017-09-07 DIAGNOSIS — Z5111 Encounter for antineoplastic chemotherapy: Secondary | ICD-10-CM | POA: Insufficient documentation

## 2017-09-07 DIAGNOSIS — R634 Abnormal weight loss: Secondary | ICD-10-CM | POA: Diagnosis not present

## 2017-09-07 DIAGNOSIS — T451X5A Adverse effect of antineoplastic and immunosuppressive drugs, initial encounter: Secondary | ICD-10-CM | POA: Insufficient documentation

## 2017-09-07 DIAGNOSIS — C50212 Malignant neoplasm of upper-inner quadrant of left female breast: Secondary | ICD-10-CM

## 2017-09-07 DIAGNOSIS — E860 Dehydration: Secondary | ICD-10-CM | POA: Diagnosis not present

## 2017-09-07 DIAGNOSIS — C773 Secondary and unspecified malignant neoplasm of axilla and upper limb lymph nodes: Secondary | ICD-10-CM | POA: Insufficient documentation

## 2017-09-07 DIAGNOSIS — R21 Rash and other nonspecific skin eruption: Secondary | ICD-10-CM | POA: Diagnosis not present

## 2017-09-07 DIAGNOSIS — D696 Thrombocytopenia, unspecified: Secondary | ICD-10-CM | POA: Insufficient documentation

## 2017-09-07 DIAGNOSIS — R630 Anorexia: Secondary | ICD-10-CM | POA: Diagnosis not present

## 2017-09-07 DIAGNOSIS — Z95828 Presence of other vascular implants and grafts: Secondary | ICD-10-CM

## 2017-09-07 DIAGNOSIS — R197 Diarrhea, unspecified: Secondary | ICD-10-CM | POA: Diagnosis not present

## 2017-09-07 DIAGNOSIS — Z171 Estrogen receptor negative status [ER-]: Secondary | ICD-10-CM | POA: Insufficient documentation

## 2017-09-07 DIAGNOSIS — I4891 Unspecified atrial fibrillation: Secondary | ICD-10-CM | POA: Insufficient documentation

## 2017-09-07 DIAGNOSIS — I10 Essential (primary) hypertension: Secondary | ICD-10-CM

## 2017-09-07 DIAGNOSIS — K5732 Diverticulitis of large intestine without perforation or abscess without bleeding: Secondary | ICD-10-CM | POA: Diagnosis not present

## 2017-09-07 DIAGNOSIS — D6481 Anemia due to antineoplastic chemotherapy: Secondary | ICD-10-CM | POA: Diagnosis not present

## 2017-09-07 DIAGNOSIS — D649 Anemia, unspecified: Secondary | ICD-10-CM | POA: Diagnosis not present

## 2017-09-07 LAB — CBC WITH DIFFERENTIAL/PLATELET
ABS GRANULOCYTE: 5.2 10*3/uL (ref 1.5–6.5)
Basophils Absolute: 0.1 10*3/uL (ref 0.0–0.1)
Basophils Relative: 1 %
Eosinophils Absolute: 0 10*3/uL (ref 0.0–0.5)
Eosinophils Relative: 0 %
HEMATOCRIT: 27.1 % — AB (ref 34.8–46.6)
HEMOGLOBIN: 8.7 g/dL — AB (ref 11.6–15.9)
LYMPHS PCT: 12 %
Lymphs Abs: 0.8 10*3/uL — ABNORMAL LOW (ref 0.9–3.3)
MCH: 28.9 pg (ref 25.1–34.0)
MCHC: 32.1 g/dL (ref 31.5–36.0)
MCV: 90 fL (ref 79.5–101.0)
Monocytes Absolute: 0.6 10*3/uL (ref 0.1–0.9)
Monocytes Relative: 9 %
Neutro Abs: 5.2 10*3/uL (ref 1.5–6.5)
Neutrophils Relative %: 78 %
Platelets: 328 10*3/uL (ref 145–400)
RBC: 3.01 MIL/uL — AB (ref 3.70–5.45)
RDW: 15.3 % (ref 11.2–16.1)
WBC: 6.7 10*3/uL (ref 3.9–10.3)

## 2017-09-07 LAB — COMPREHENSIVE METABOLIC PANEL
ALT: 26 U/L (ref 0–55)
AST: 20 U/L (ref 5–34)
Albumin: 3.2 g/dL — ABNORMAL LOW (ref 3.5–5.0)
Alkaline Phosphatase: 55 U/L (ref 40–150)
Anion gap: 10 (ref 3–11)
BUN: 27 mg/dL — ABNORMAL HIGH (ref 7–26)
CHLORIDE: 101 mmol/L (ref 98–109)
CO2: 27 mmol/L (ref 22–29)
Calcium: 9.5 mg/dL (ref 8.4–10.4)
Creatinine, Ser: 1.07 mg/dL (ref 0.60–1.10)
GFR, EST AFRICAN AMERICAN: 58 mL/min — AB (ref >60)
GFR, EST NON AFRICAN AMERICAN: 50 mL/min — AB (ref >60)
Glucose, Bld: 114 mg/dL (ref 70–140)
POTASSIUM: 4.1 mmol/L (ref 3.3–4.7)
Sodium: 138 mmol/L (ref 136–145)
Total Bilirubin: 0.5 mg/dL (ref 0.2–1.2)
Total Protein: 6.1 g/dL — ABNORMAL LOW (ref 6.4–8.3)

## 2017-09-07 MED ORDER — PALONOSETRON HCL INJECTION 0.25 MG/5ML
0.2500 mg | Freq: Once | INTRAVENOUS | Status: AC
  Administered 2017-09-07: 0.25 mg via INTRAVENOUS

## 2017-09-07 MED ORDER — SODIUM CHLORIDE 0.9% FLUSH
10.0000 mL | INTRAVENOUS | Status: DC | PRN
  Administered 2017-09-07: 10 mL via INTRAVENOUS
  Filled 2017-09-07: qty 10

## 2017-09-07 MED ORDER — FAMOTIDINE IN NACL 20-0.9 MG/50ML-% IV SOLN
INTRAVENOUS | Status: AC
  Filled 2017-09-07: qty 50

## 2017-09-07 MED ORDER — SODIUM CHLORIDE 0.9 % IV SOLN
229.6000 mg | Freq: Once | INTRAVENOUS | Status: AC
  Administered 2017-09-07: 230 mg via INTRAVENOUS
  Filled 2017-09-07: qty 23

## 2017-09-07 MED ORDER — DIPHENHYDRAMINE HCL 50 MG/ML IJ SOLN
INTRAMUSCULAR | Status: AC
  Filled 2017-09-07: qty 1

## 2017-09-07 MED ORDER — SODIUM CHLORIDE 0.9 % IV SOLN
Freq: Once | INTRAVENOUS | Status: AC
  Administered 2017-09-07: 09:00:00 via INTRAVENOUS

## 2017-09-07 MED ORDER — PALONOSETRON HCL INJECTION 0.25 MG/5ML
INTRAVENOUS | Status: AC
  Filled 2017-09-07: qty 5

## 2017-09-07 MED ORDER — HEPARIN SOD (PORK) LOCK FLUSH 100 UNIT/ML IV SOLN
500.0000 [IU] | Freq: Once | INTRAVENOUS | Status: AC | PRN
  Administered 2017-09-07: 500 [IU]
  Filled 2017-09-07: qty 5

## 2017-09-07 MED ORDER — SODIUM CHLORIDE 0.9 % IV SOLN
80.0000 mg/m2 | Freq: Once | INTRAVENOUS | Status: AC
  Administered 2017-09-07: 192 mg via INTRAVENOUS
  Filled 2017-09-07: qty 32

## 2017-09-07 MED ORDER — SODIUM CHLORIDE 0.9% FLUSH
10.0000 mL | INTRAVENOUS | Status: DC | PRN
  Administered 2017-09-07: 10 mL
  Filled 2017-09-07: qty 10

## 2017-09-07 MED ORDER — DIPHENHYDRAMINE HCL 50 MG/ML IJ SOLN
50.0000 mg | Freq: Once | INTRAMUSCULAR | Status: AC
  Administered 2017-09-07: 50 mg via INTRAVENOUS

## 2017-09-07 MED ORDER — FAMOTIDINE IN NACL 20-0.9 MG/50ML-% IV SOLN
20.0000 mg | Freq: Once | INTRAVENOUS | Status: AC
  Administered 2017-09-07: 20 mg via INTRAVENOUS

## 2017-09-07 MED ORDER — DEXAMETHASONE SODIUM PHOSPHATE 100 MG/10ML IJ SOLN
20.0000 mg | Freq: Once | INTRAMUSCULAR | Status: AC
  Administered 2017-09-07: 20 mg via INTRAVENOUS
  Filled 2017-09-07: qty 2

## 2017-09-07 NOTE — Telephone Encounter (Signed)
Gave calendar for January

## 2017-09-07 NOTE — Progress Notes (Signed)
[place]  [phone] [phone]  Clinic Follow Up Note   Patient Care Team: Ronita Hipps, MD as PCP - General (Family Medicine) Coralie Keens, MD as Consulting Physician (General Surgery) Richardo Priest, MD as Consulting Physician (Cardiology)   Date of Service:  09/07/2017  CHIEF COMPLAINTS  F/u left breast cancer, triple negative, stage IIIB  ONCOLOGY HISTORY: Oncology History   Cancer Staging Malignant neoplasm of upper-inner quadrant of left breast in female, estrogen receptor negative ([place]) Staging form: Breast, AJCC 8th Edition - Clinical stage from 06/10/2017: Stage IIIB (cT2, cN1, cM0, G3, ER: Negative, PR: Negative, HER2: Negative) - Signed by Truitt Merle, MD on 06/21/2017       Malignant neoplasm of upper-inner quadrant of left breast in female, estrogen receptor negative ([place])   06/08/2017 Imaging    DIAGNOSTIC MAMMO and [place] Left Breast 06/08/17 IMPRESSION:  Highly suspicious palpable left breast 11 o'clock mass which measures 2.8 cm in greatest dimension. Ductal extension anteriorly to the level of the nipple, and posteriorly for additional 1.8cm, is suspicious for tumor extension.       06/10/2017 Initial Diagnosis    Malignant neoplasm of upper-inner quadrant of left breast in female, estrogen receptor negative ([place])      06/10/2017 Pathology Results    Diagnosis 06/10/17 1.) Breast, left, needle core biopsy -INVASIVE DUCTAL CARCINOMA, GRADE 3, WITH FOCAL HIGH GRADE DUCTAL CARCINOMA IN SITU -NEOPLASM INVOLVES ALL CORES AND MEASURES APPROXIMATELY 1.5CM IN MAXIMAL LINEAR DIMENSION  2.) Lymph node, needle/core biopsy, left axilla -METASTATIC CARCINOMA CONSISTENT WITH BREAST PRIMARY       06/10/2017 Receptors her2    ER: -, PR-, HER2 not amplified       06/23/2017 Echocardiogram    ECHO 06/23/17 Study Conclusions - Left ventricle: The cavity size was normal. Wall thickness was   increased in a pattern of  moderate LVH. Systolic function was   normal. The estimated ejection fraction was in the range of 60%   to 65%. Wall motion was normal; there were no regional wall   motion abnormalities. Left ventricular diastolic function   parameters were normal. - Mitral valve: Calcified annulus. Mildly thickened leaflets . - Left atrium: The atrium was mildly dilated. - Atrial septum: There was increased thickness of the septum,   consistent with lipomatous hypertrophy. No defect or patent   foramen ovale was identified. - Impressions: Normal GLS - 18.6 Impressions: - Normal GLS - 18.6      06/28/2017 Imaging    CT CAP W Contrast 06/28/17 IMPRESSION: 1. Mildly enlarged left axillary lymph node at 1.5 cm, a significant change from the prior chest CT from 2015, suspicious for left axillary metastatic disease. 2. Left upper breast lesion 2.1 cm in diameter with higher density margins and lower density center, favoring lumpectomy site. 3. Mildly enlarged right hilar lymph node, but stable compared back through 2015. 4. No findings of osseous metastatic disease or additional metastatic sites. 5. Other imaging findings of potential clinical significance: Aberrant right subclavian artery passes behind the esophagus. Aortic Atherosclerosis (ICD10-I70.0) and Emphysema (ICD10-J43.9). Coronary atherosclerosis. Mild nodular enlargement of the left inferior thyroid lobe, isodense to the rest of the thyroid. Airway thickening is present, suggesting bronchitis or reactive airways disease. Left hepatic lobe cyst. Left renal cyst. Likely small cysts in the right kidney. Sigmoid colon diverticulosis.       06/28/2017 Imaging    Bone Scan 06/28/17 IMPRESSION: No evidence of osseous metastatic disease. Subtle increased radiotracer  uptake within the left chest wall, likely within the left breast.      06/30/2017 Surgery    Port placement by Dr. [name] on 06/30/17      07/01/2017 -  Chemotherapy     Neoadjuvant Adriamycin and Cytoxan (AC) every 2 weeks for 4 cycles starting 07/01/17. Dose reduced starting with cycle 2. Compelted AC on 12.19.18. Started weekly Carbo and Taxol every on 08/31/17        07/23/2017 - 07/26/2017 Hospital Admission    Admit date: 07/23/17 Admission diagnosis: 07/26/17 Additional comments: febrile neutropenia; diverticulitis      08/29/2017 Mammogram    Diagnostic Mammogram and [place] 08/29/17  IMPRESSION:  Interval decrease in the size of known left 11 o'clock malignancy, with greatest dimension of 1.3cm.  Interval decrease in size of known left malignant axillary lymph node, with cortical thickness of 3 mm.        HISTORY OF PRESENTING ILLNESS:  Lindsay [name] 72 y.o. female is here because of left breast cancer.   Initially, the patient palpated a lump within the retroareolar region of the left breast at the end of July. She also had noticed some left nipple inversion, soreness, and hardness around the nipple for several months prior. In the past three months she reports that her soreness had improved originally, but since then she has not noticed any changes or worsening. She denies any abdominal pain, back pain, cough, chest pain, loss of appetite, weight loss, or any other associated symptoms in this time period.   She subsequently underwent mammogram and US of the left breast including the axilla which was notable for a suspicious mass within the 11 o'clock region of the left breast measuring at 2.8cm in the greatest dimension. Following this, she underwent stereotactic needle-core biopsy showing her to have invasive ductal carcinoma, histologic analysis demonstrating triple negative breast cancer, Ki67 20%. Additionally, an enlarged lymph node was surveyed within the left axilla which was positive for metastatic carcinoma.   On 06/17/17, she met with Dr [name] of [facility]. Given her triple negative cancer and the invasive nature of her  disease, he recommended mastectomy with SLN dissection with port placement. She was subsequently referred into medical oncology for ongoing management of her disease to consider beginning surgery vs chemotherapy as the first round of treatment.   She reports today with her husband and sister. Overall she has been doing well and without notable acute complaints. Her breast soreness has resolved since initial onset. She has no other symptoms of concern to her. Of note, both of her sisters have also been diagnosed with breast cancer and she has an extensive family history of other cancers, including her mother who had pancreatic cancer.   GYN HISTORY  Menarchal: 72 years of age LMP: 21 years ago Contraceptive: Yes in her youth HRT: none GP: G2P2, she was 72yo at the age of first delivery. She did not breast feed.   She did previously smoke for approximately 50 years. She quit three years ago on August 4th, 2015. On average, she smoked 2ppd. She does not drink any alcoholic beverages and does not partake in illicit drugs.    CURRENT THERAPY:  Neoadjuvant Adriamycin and Cytoxan (AC) every 2 weeks for 4 cycles starting 07/01/17,  reduced 10% due to poor toleration starting with cycle 2. Completed on 08/17/17. Start Carbo and taxol weekly for 12 cycles 08/31/17   INTERVAL HISTORY:  MAGDALINE ZOLLARS is here for a follow up and  cycle 1 of Taxol and Carbo. She presents in the infusion room today accompanied by her family member. She reports that her first cycle of Carbo/Taxol was tolerated well and much better than her previous chemotherapy. She did have mild fatigue, but this was manageable. She does report some new aching pain within her hips, but this was managed well with OTC analgesics. She does not feel limited with her daily activities. She does report some loss of appetite with approximately 20lbs weight loss over the past several weeks. She states that she has lost her appetite for the things that she  typically enjoys to eat.   On review of systems, pt denies fever, chills, rash, mouth sores, urinary complaints. Pt denies abdominal pain, nausea, vomiting. Pertinent positives are listed and detailed within the above HPI.     MEDICAL HISTORY:  Past Medical History:  Diagnosis Date  . Breast cancer ([place])   . Cancer [facility])    left breast cancer  . Dysrhythmia    PACs and nonsustained runs of atrial tach by Holter 10/13/16  . Family history of breast cancer   . Family history of colon cancer   . Family history of melanoma   . Family history of pancreatic cancer   . History of kidney stones   . Hypertension   . Personal history of colonic polyps   . Personal history of skin cancer     SURGICAL HISTORY: Past Surgical History:  Procedure Laterality Date  . COLONOSCOPY    . FOOT SURGERY Right   . IR CV LINE INJECTION  08/02/2017  . left breast lumpectomy  2000  . PORTACATH PLACEMENT Right 06/30/2017   Procedure: [place];  Surgeon: Coralie Keens, MD;  Location: [facility];  Service: General;  Laterality: Right;    SOCIAL HISTORY: Social History   Socioeconomic History  . Marital status: Married    Spouse name: Not on file  . Number of children: Not on file  . Years of education: Not on file  . Highest education level: Not on file  Social Needs  . Financial resource strain: Not on file  . Food insecurity - worry: Not on file  . Food insecurity - inability: Not on file  . Transportation needs - medical: Not on file  . Transportation needs - non-medical: Not on file  Occupational History  . Not on file  Tobacco Use  . Smoking status: Former Smoker    Packs/day: 2.00    Years: 50.00    Pack years: 100.00    Last attempt to quit: 06/02/2014    Years since quitting: 3.2  . Smokeless tobacco: Never Used  Substance and Sexual Activity  . Alcohol use: No  . Drug use: No  . Sexual activity: Yes  Other Topics Concern  . Not on file  Social History  Narrative  . Not on file    FAMILY HISTORY: Family History  Problem Relation Age of Onset  . Pancreatic cancer Mother 44  . Breast cancer Sister 8       bilateral mastectomy  . Basal cell carcinoma Sister        'many'  . Breast cancer Paternal Aunt   . Breast cancer Sister 58  . Melanoma Sister   . Colon cancer Sister 60    ALLERGIES:  has No Known Allergies.  MEDICATIONS:  Current Outpatient Medications  Medication Sig Dispense Refill  . acebutolol (SECTRAL) 200 MG capsule Take 200 mg by mouth 2 (two)  times daily.    Marland Kitchen aspirin EC 81 MG tablet Take 81 mg by mouth daily.    Marland Kitchen atorvastatin (LIPITOR) 10 MG tablet Take 10 mg by mouth at bedtime.    . calcium carbonate (OS-CAL) 600 MG TABS tablet Take 1,200 mg by mouth daily.    . Cholecalciferol (VITAMIN D3) 2000 units TABS Take 2,000 Units by mouth daily.     . cloNIDine (CATAPRES) 0.1 MG tablet Take 0.1 mg by mouth 2 (two) times daily.    . DOCOSAHEXAENOIC ACID PO Take 1,200 mg by mouth 2 (two) times daily.    Marland Kitchen escitalopram (LEXAPRO) 5 MG tablet Take 5 mg by mouth daily.    . hydrALAZINE (APRESOLINE) 25 MG tablet Take 12.5 mg by mouth 3 (three) times daily.    Marland Kitchen lidocaine-prilocaine (EMLA) cream Apply 1 application topically as needed. 30 g 2  . loratadine (CLARITIN) 10 MG tablet Take 10 mg by mouth daily.    Marland Kitchen LORazepam (ATIVAN) 1 MG tablet Take 1 tablet (1 mg total) by mouth once as needed for anxiety. Take 1 tab 1 hour before MRI scan 2 tablet 0  . magnesium oxide (MAG-OX) 400 MG tablet Take 400 mg by mouth daily.    . Multiple Vitamins-Minerals (EYE VITAMINS PO) Take 1 capsule by mouth 2 (two) times daily.    Marland Kitchen olmesartan-hydrochlorothiazide (BENICAR HCT) 40-25 MG tablet Take 1 tablet by mouth daily.  1  . ondansetron (ZOFRAN) 8 MG tablet Take 1 tablet (8 mg total) by mouth every 8 (eight) hours as needed for nausea or vomiting. (Patient not taking: Reported on 07/29/2017) 20 tablet 2  . prochlorperazine (COMPAZINE) 10 MG  tablet Take 1 tablet (10 mg total) by mouth every 6 (six) hours as needed for nausea or vomiting. (Patient not taking: Reported on 07/29/2017) 30 tablet 2  . vitamin B-12 (CYANOCOBALAMIN) 1000 MCG tablet Take 1 tablet by mouth daily.     No current facility-administered medications for this visit.    Facility-Administered Medications Ordered in Other Visits  Medication Dose Route Frequency Provider Last Rate Last Dose  . CARBOplatin (PARAPLATIN) 230 mg in sodium chloride 0.9 % 250 mL chemo infusion  230 mg Intravenous Once Truitt Merle, MD      . dexamethasone (DECADRON) 20 mg in sodium chloride 0.9 % 50 mL IVPB  20 mg Intravenous Once Truitt Merle, MD      . famotidine (PEPCID) IVPB 20 mg premix  20 mg Intravenous Once Truitt Merle, MD   20 mg at 09/07/17 0277  . heparin lock flush 100 unit/mL  500 Units Intracatheter Once PRN Truitt Merle, MD      . PACLitaxel (TAXOL) 192 mg in sodium chloride 0.9 % 250 mL chemo infusion (</= '80mg'$ /m2)  80 mg/m2 (Treatment Plan Recorded) Intravenous Once Truitt Merle, MD      . sodium chloride flush (NS) 0.9 % injection 10 mL  10 mL Intravenous PRN Truitt Merle, MD   10 mL at 07/29/17 1310  . sodium chloride flush (NS) 0.9 % injection 10 mL  10 mL Intracatheter PRN Truitt Merle, MD        REVIEW OF SYSTEMS:   Constitutional: Denies fevers,  abnormal night sweats (+) significant fatigue (+) occasional chills  Eyes: Denies blurriness of vision, double vision or watery eyes Ears, nose, mouth, throat, and face: Denies mucositis or sore throat (+) epistaxis upon blowing nose  Respiratory: Denies cough, wheezes (+) SOB upon exertion  Cardiovascular: Denies palpitation, chest discomfort or  lower extremity swelling Gastrointestinal:  Denies nausea, heartburn or change in bowel habits Skin: Denies abnormal skin rashes Lymphatics: Denies new lymphadenopathy or easy bruising MSK: (+) Mild lower back pain  Neurological:Denies numbness, tingling or new weaknesses Behavioral/Psych: Mood is  stable, no new changes  All other systems were reviewed with the patient and are negative.  PHYSICAL EXAMINATION: ECOG PERFORMANCE STATUS: 1-2 Blood pressure 149/63, pulse 66, respiratory rate 18, temperature 36.4, pulse ox 94% on room air GENERAL:alert, no distress and comfortable SKIN: skin color, texture, turgor are normal, no rashes or significant lesions EYES: normal, conjunctiva are pink and non-injected, sclera clear OROPHARYNX:no exudate, no erythema and lips, buccal mucosa, and tongue normal  NECK: supple, thyroid normal size, non-tender, without nodularity LYMPH:  no palpable lymphadenopathy in the cervical, axillary or inguinal LUNGS: clear to auscultation and percussion with normal breathing effort HEART: regular rate & rhythm and no murmurs and no lower extremity edema ABDOMEN:abdomen soft, non-tender and normal bowel sounds Musculoskeletal:no cyanosis of digits and no clubbing  PSYCH: alert & oriented x 3 with fluent speech NEURO: no focal motor/sensory deficits BREAST: deferred today    LABORATORY DATA:  I have reviewed the data as listed CBC Latest Ref Rng & Units 09/07/2017 08/31/2017 08/17/2017  WBC 3.9 - 10.3 K/uL 6.7 19.8(H) 20.9(H)  Hemoglobin 11.6 - 15.9 g/dL 8.7(L) 8.3(L) 8.1(L)  Hematocrit 34.8 - 46.6 % 27.1(L) 24.8(L) 24.6(L)  Platelets 145 - 400 K/uL 328 201 221   CMP Latest Ref Rng & Units 09/07/2017 08/31/2017 08/17/2017  Glucose 70 - 140 mg/dL 114 105 107  BUN 7 - 26 mg/dL 27(H) 16.2 16.4  Creatinine 0.60 - 1.10 mg/dL 1.07 1.0 1.2(H)  Sodium 136 - 145 mmol/L 138 136 139  Potassium 3.3 - 4.7 mmol/L 4.1 3.8 4.1  Chloride 98 - 109 mmol/L 101 - -  CO2 22 - 29 mmol/L _0 Calcium 8.4 - 10.4 mg/dL 9.5 8.8 9.1  Total Protein 6.4 - 8.3 g/dL 6.1(L) 6.1(L) 6.4  Total Bilirubin 0.2 - 1.2 mg/dL 0.5 0.44 0.42  Alkaline Phos 40 - 150 U/L 55 79 82  AST 5 - 34 U/L _1 ALT 0 - 55 U/L 26 46 22   PATHOLOGY:  Diagnosis 06/10/17 1.) Breast, left, needle core  biopsy -INVASIVE DUCTAL CARCINOMA, GRADE 3, WITH FOCAL HIGH GRADE DUCTAL CARCINOMA IN SITU -NEOPLASM INVOLVES ALL CORES AND MEASURES APPROXIMATELY 1.5CM IN MAXIMAL LINEAR DIMENSION -A BREAST PROGNOSTIC PROFILE WILL BE ORDERED ON BLOCK 1A AND SEPARATELY REPORTED   2.) Lymph node, needle/core biopsy, left axilla -METASTATIC CARCINOMA CONSISTENT WITH BREAST PRIMARY    PROCEDURES  ECHO 06/23/17 Study Conclusions - Left ventricle: The cavity size was normal. Wall thickness was   increased in a pattern of moderate LVH. Systolic function was   normal. The estimated ejection fraction was in the range of 60%   to 65%. Wall motion was normal; there were no regional wall   motion abnormalities. Left ventricular diastolic function   parameters were normal. - Mitral valve: Calcified annulus. Mildly thickened leaflets . - Left atrium: The atrium was mildly dilated. - Atrial septum: There was increased thickness of the septum,   consistent with lipomatous hypertrophy. No defect or patent   foramen ovale was identified. - Impressions: Normal GLS - 18.6 Impressions: - Normal GLS - 18.6   RADIOGRAPHIC STUDIES: I have personally reviewed the radiological images as listed and agreed with the findings in the report.  Diagnostic Mammogram and [place]  08/29/17  IMPRESSION:  Interval decrease in the size of known left 11 o'clock malignancy, with greatest dimension of 1.3cm.  Interval decrease in size of known left malignant axillary lymph node, with cortical thickness of 3 mm.    No results found.   [place] Left Breast 06/08/17 IMPRESSION:  Highly suspicious palpable left breast 11 o'clock mass which measures 2.8 cm in greatest dimension. Ductal extension anteriorly to the level of the nipple, and posteriorly for additional 1.8cm, is suspicious for tumor extension.   ASSESSMENT: Lindsay [name] is a wonderful 72 y.o. female who presents today to discuss the ongoing management of her left breast cancer.    1. 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  -We discussed her mammogram, ultrasound findings, and initial biopsy results with patient and her family members in great detail.  -She previously presented with a palpable left breast mass, measures 2.8 cm on ultrasound, with left axillary lymph node involvement (not palpable on exam). Breast tumor and node biopsy showed triple negative breast ductal carcinoma. -She was seen by surgeon Dr. [name] who recommended left mastectomy with ALND and port placement.  -We discussed her risk of cancer recurrence after complete surgical resection. Due to the relatively large size of her primary tumor, positive lymph nodes, and aggressive nature of her triple negative breast cancer, she is at very high risk of recurrence. -Due to her positive family history of breast cancer, we recommended her to see genetics, to rule out inheritable breast cancer syndrome.   -Her 06/28/17 CT CAP and bone scan shows no evidence of metastasis. MRI was not done due to logistics, will be rescheduled at [place]  -She started neoadjuvant AC with onpro on 07/01/17, she experienced significant fatigue, anorexia and some diarrhea and constipation and become febrile once.  She has lost 5 pounds in the past 2 weeks. I reduced her cycle 2 dose by 10%. Due to port dysfunction, cycle 3 was post poned 1 week. She completed 4 cycles AC on 08/07/17  -Ms. Bokhari was hospitalized 11/24-11/27 for neutropenic fever and diverticulitis after cycle 2 chemotherapy.  She developed skin rash after hospital discharge, likely related to her oral antibiotics Levaquin and or Flagyl.  She has significant leukocytosis, likely related to her recent Neupogen she received in the hospital.  No clinical suspicion for sepsis or infection.   -Since her breast mass was not palpable on exam, I have  ordered a interim ultrasound and mammogram that was done on 08/29/2017, which showed significant reduction of the breast mass, from 2.8 cm to 1.3 cm, and decreased adenopathy in the left axilla.  She has had a good response to chemo. -Given the locally advanced triple negative disease, I recommend her to continue chemo with weekly carboplatin and Taxol for a total of 12 weeks.  The benefits and side effects of carboplatin in addition to Taxol was discussed with her, especially the risk of neutropenic fever, anemia needing blood transfusion, etc. She voiced good understanding and agrees to proceed.  -She started weekly Carbo and taxol on 08/31/16  -She did receive 1u PRBC last week due to her low Hgb, her Hgb has improved somewhat (8.7 today). We will transfuse another unit in the near future to continue to help with her counts. -Her counts are adequate to proceed with cycle 2 of Carbo/Taxol today.  -I discussed with her that if her counts continue to trend  low or drop further that we can hold Carbo and give supplemental Granix injection.   2. AFIB and HTN -She is currently followed by a cardiologist for ongoing management of this. Not currently on formal anticoagulation therapy.  -She was on 30m ASA daily, but she reports that she recently stopped this in anticipation for her surgery. I encouraged her to start taking this again until her surgeon tell her to stop taking this.  -Baseline Echo adequate to start chemo  3. Genetics -I previously encouraged her to have genetic testing due to her strong family history of cancer. Pt was seen by a genetic counselor on 08/31/17 and submitted for testing.If this is positive, I informed her that her children and grandchildren would need to be tested as well.    4. Diverticulitis requiring hospitalization and IV antibiotics  -Hospitalized 11/24-11/27 and treated with antibiotics Levaquin and or Flagyl -She acquired rash from her antibiotics and stopped the  medication.  -Resolved now  5. Decreased appetite and weight loss -I spoke today with the patient about how I have noticed that she has lost approximately 20lbs over the past 6 weeks. She attributes this to her poor appetite.  -I encouraged her to eat as much as she can tolerate and to additionally begin eating a more high protein diet so that she can keep up her caloric intake.  -I also encouraged her to drink Boost supplements as she is able.    PLAN:  -Labs reviewed; adequate to proceed with cycle 2 Carbo/Taxol today.  -Add 2 hrs blood transfusion hours in one week  Orders Placed This Encounter  Procedures  . Draw extra clot tube    Standing Status:   Future    Standing Expiration Date:   09/07/2018   This document serves as a record of services personally performed by YTruitt Merle MD. It was created on her behalf by DTheresia Bough a trained medical scribe. The creation of this record is based on the scribe's personal observations and the provider's statements to them.   I have reviewed the above documentation for accuracy and completeness, and I agree with the above.    YTruitt Merle MD 09/07/2017

## 2017-09-07 NOTE — Progress Notes (Signed)
Okay to treat with CBC results, patient will have 2u PRBCs with next treatment.

## 2017-09-07 NOTE — Patient Instructions (Signed)
[facility] Discharge Instructions for Patients Receiving Chemotherapy  Today you received the following chemotherapy agents: Paclitaxel (Taxol) and Carboplatin (Paraplatin).  To help prevent nausea and vomiting after your treatment, we encourage you to take your nausea medication as prescribed. Received Aloxi during treatment-->take Compazine for next 3 days If you develop nausea and vomiting that is not controlled by your nausea medication, call the clinic.   BELOW ARE SYMPTOMS THAT SHOULD BE REPORTED IMMEDIATELY:  *FEVER GREATER THAN 100.5 F  *CHILLS WITH OR WITHOUT FEVER  NAUSEA AND VOMITING THAT IS NOT CONTROLLED WITH YOUR NAUSEA MEDICATION  *UNUSUAL SHORTNESS OF BREATH  *UNUSUAL BRUISING OR BLEEDING  TENDERNESS IN MOUTH AND THROAT WITH OR WITHOUT PRESENCE OF ULCERS  *URINARY PROBLEMS  *BOWEL PROBLEMS  UNUSUAL RASH Items with * indicate a potential emergency and should be followed up as soon as possible.  Feel free to call the clinic should you have any questions or concerns. The clinic phone number is [phone].  Please show the [facility] at check-in to the Emergency Department and triage nurse.

## 2017-09-12 ENCOUNTER — Telehealth: Payer: Self-pay | Admitting: Genetics

## 2017-09-12 NOTE — Telephone Encounter (Signed)
Revealed negative genetic testing.   This normal result is reassuring and indicates that it is unlikely Lindsay [name]'s cancer is due to a hereditary cause.  It is unlikely that there is an increased risk of another cancer due to a mutation in one of these genes.  However, genetic testing is not perfect, and cannot definitively rule out a hereditary cause.  She does still have a family history of cancer and could be at increased risk for cancer given the family history.  It will be important for her to keep in contact with genetics to learn if any additional testing may be needed in the future.   We recommended all of her siblings and maternal relatives also pursue genetic testing.  There could be a genetic mutation causing the cancer in the family that Lindsay [name] did not inherit and therefore was not found in her genetic testing.

## 2017-09-14 ENCOUNTER — Ambulatory Visit: Payer: Medicare Other

## 2017-09-14 ENCOUNTER — Other Ambulatory Visit: Payer: Medicare Other

## 2017-09-15 ENCOUNTER — Inpatient Hospital Stay: Payer: Medicare Other

## 2017-09-15 ENCOUNTER — Telehealth: Payer: Self-pay | Admitting: Nurse Practitioner

## 2017-09-15 ENCOUNTER — Encounter: Payer: Self-pay | Admitting: Genetics

## 2017-09-15 ENCOUNTER — Ambulatory Visit: Payer: Self-pay | Admitting: Genetics

## 2017-09-15 VITALS — BP 163/66 | HR 73 | Temp 98.2°F | Resp 16

## 2017-09-15 DIAGNOSIS — Z803 Family history of malignant neoplasm of breast: Secondary | ICD-10-CM

## 2017-09-15 DIAGNOSIS — Z808 Family history of malignant neoplasm of other organs or systems: Secondary | ICD-10-CM

## 2017-09-15 DIAGNOSIS — R197 Diarrhea, unspecified: Secondary | ICD-10-CM | POA: Diagnosis not present

## 2017-09-15 DIAGNOSIS — Z860100 Personal history of colon polyps, unspecified: Secondary | ICD-10-CM

## 2017-09-15 DIAGNOSIS — Z1379 Encounter for other screening for genetic and chromosomal anomalies: Secondary | ICD-10-CM

## 2017-09-15 DIAGNOSIS — C50212 Malignant neoplasm of upper-inner quadrant of left female breast: Secondary | ICD-10-CM

## 2017-09-15 DIAGNOSIS — Z5111 Encounter for antineoplastic chemotherapy: Secondary | ICD-10-CM | POA: Diagnosis not present

## 2017-09-15 DIAGNOSIS — Z171 Estrogen receptor negative status [ER-]: Secondary | ICD-10-CM | POA: Diagnosis not present

## 2017-09-15 DIAGNOSIS — T451X5A Adverse effect of antineoplastic and immunosuppressive drugs, initial encounter: Secondary | ICD-10-CM | POA: Diagnosis not present

## 2017-09-15 DIAGNOSIS — Z85828 Personal history of other malignant neoplasm of skin: Secondary | ICD-10-CM

## 2017-09-15 DIAGNOSIS — E860 Dehydration: Secondary | ICD-10-CM | POA: Diagnosis not present

## 2017-09-15 DIAGNOSIS — C773 Secondary and unspecified malignant neoplasm of axilla and upper limb lymph nodes: Secondary | ICD-10-CM | POA: Diagnosis not present

## 2017-09-15 DIAGNOSIS — I10 Essential (primary) hypertension: Secondary | ICD-10-CM | POA: Diagnosis not present

## 2017-09-15 DIAGNOSIS — K5732 Diverticulitis of large intestine without perforation or abscess without bleeding: Secondary | ICD-10-CM | POA: Diagnosis not present

## 2017-09-15 DIAGNOSIS — R7989 Other specified abnormal findings of blood chemistry: Secondary | ICD-10-CM | POA: Diagnosis not present

## 2017-09-15 DIAGNOSIS — R21 Rash and other nonspecific skin eruption: Secondary | ICD-10-CM | POA: Diagnosis not present

## 2017-09-15 DIAGNOSIS — D696 Thrombocytopenia, unspecified: Secondary | ICD-10-CM | POA: Diagnosis not present

## 2017-09-15 DIAGNOSIS — Z800 Family history of malignant neoplasm of digestive organs: Secondary | ICD-10-CM

## 2017-09-15 DIAGNOSIS — D6481 Anemia due to antineoplastic chemotherapy: Secondary | ICD-10-CM | POA: Diagnosis not present

## 2017-09-15 DIAGNOSIS — I4891 Unspecified atrial fibrillation: Secondary | ICD-10-CM | POA: Diagnosis not present

## 2017-09-15 HISTORY — DX: Encounter for other screening for genetic and chromosomal anomalies: Z13.79

## 2017-09-15 LAB — CBC WITH DIFFERENTIAL/PLATELET
Basophils Absolute: 0 10*3/uL (ref 0.0–0.1)
Basophils Relative: 1 %
EOS ABS: 0 10*3/uL (ref 0.0–0.5)
Eosinophils Relative: 1 %
HEMATOCRIT: 25.4 % — AB (ref 34.8–46.6)
HEMOGLOBIN: 8.1 g/dL — AB (ref 11.6–15.9)
Lymphocytes Relative: 20 %
Lymphs Abs: 0.8 10*3/uL — ABNORMAL LOW (ref 0.9–3.3)
MCH: 29.3 pg (ref 25.1–34.0)
MCHC: 31.9 g/dL (ref 31.5–36.0)
MCV: 92 fL (ref 79.5–101.0)
MONOS PCT: 18 %
Monocytes Absolute: 0.7 10*3/uL (ref 0.1–0.9)
NEUTROS PCT: 60 %
Neutro Abs: 2.3 10*3/uL (ref 1.5–6.5)
Platelets: 211 10*3/uL (ref 145–400)
RBC: 2.76 MIL/uL — AB (ref 3.70–5.45)
RDW: 16.9 % — ABNORMAL HIGH (ref 11.2–16.1)
WBC: 3.9 10*3/uL (ref 3.9–10.3)

## 2017-09-15 LAB — COMPREHENSIVE METABOLIC PANEL
ALT: 23 U/L (ref 0–55)
AST: 22 U/L (ref 5–34)
Albumin: 3.5 g/dL (ref 3.5–5.0)
Alkaline Phosphatase: 49 U/L (ref 40–150)
Anion gap: 10 (ref 3–11)
BUN: 30 mg/dL — ABNORMAL HIGH (ref 7–26)
CHLORIDE: 103 mmol/L (ref 98–109)
CO2: 27 mmol/L (ref 22–29)
CREATININE: 1.21 mg/dL — AB (ref 0.60–1.10)
Calcium: 9 mg/dL (ref 8.4–10.4)
GFR calc non Af Amer: 43 mL/min — ABNORMAL LOW (ref >60)
GFR, EST AFRICAN AMERICAN: 50 mL/min — AB (ref >60)
Glucose, Bld: 94 mg/dL (ref 70–140)
POTASSIUM: 4.5 mmol/L (ref 3.3–4.7)
SODIUM: 140 mmol/L (ref 136–145)
Total Bilirubin: 0.4 mg/dL (ref 0.2–1.2)
Total Protein: 6.1 g/dL — ABNORMAL LOW (ref 6.4–8.3)

## 2017-09-15 LAB — PREPARE RBC (CROSSMATCH)

## 2017-09-15 LAB — ABO/RH: ABO/RH(D): A POS

## 2017-09-15 MED ORDER — SODIUM CHLORIDE 0.9 % IV SOLN: 250.0000 mL | Freq: Once | INTRAVENOUS | Status: DC

## 2017-09-15 MED ORDER — SODIUM CHLORIDE 0.9% FLUSH
10.0000 mL | INTRAVENOUS | Status: DC | PRN
  Administered 2017-09-15: 10 mL
  Filled 2017-09-15: qty 10

## 2017-09-15 MED ORDER — PALONOSETRON HCL INJECTION 0.25 MG/5ML
INTRAVENOUS | Status: AC
  Filled 2017-09-15: qty 5

## 2017-09-15 MED ORDER — ACETAMINOPHEN 325 MG PO TABS
ORAL_TABLET | ORAL | Status: AC
  Filled 2017-09-15: qty 2

## 2017-09-15 MED ORDER — DIPHENHYDRAMINE HCL 25 MG PO CAPS: 25.0000 mg | ORAL_CAPSULE | Freq: Once | ORAL | Status: DC

## 2017-09-15 MED ORDER — FAMOTIDINE IN NACL 20-0.9 MG/50ML-% IV SOLN
20.0000 mg | Freq: Once | INTRAVENOUS | Status: AC
  Administered 2017-09-15: 20 mg via INTRAVENOUS

## 2017-09-15 MED ORDER — SODIUM CHLORIDE 0.9 % IV SOLN
20.0000 mg | Freq: Once | INTRAVENOUS | Status: AC
  Administered 2017-09-15: 20 mg via INTRAVENOUS
  Filled 2017-09-15: qty 2

## 2017-09-15 MED ORDER — SODIUM CHLORIDE 0.9 % IV SOLN
210.0000 mg | Freq: Once | INTRAVENOUS | Status: AC
  Administered 2017-09-15: 210 mg via INTRAVENOUS
  Filled 2017-09-15: qty 21

## 2017-09-15 MED ORDER — SODIUM CHLORIDE 0.9 % IV SOLN
80.0000 mg/m2 | Freq: Once | INTRAVENOUS | Status: AC
  Administered 2017-09-15: 192 mg via INTRAVENOUS
  Filled 2017-09-15: qty 32

## 2017-09-15 MED ORDER — DIPHENHYDRAMINE HCL 50 MG/ML IJ SOLN
INTRAMUSCULAR | Status: AC
  Filled 2017-09-15: qty 1

## 2017-09-15 MED ORDER — SODIUM CHLORIDE 0.9 % IV SOLN
Freq: Once | INTRAVENOUS | Status: AC
  Administered 2017-09-15: 13:00:00 via INTRAVENOUS

## 2017-09-15 MED ORDER — PALONOSETRON HCL INJECTION 0.25 MG/5ML
0.2500 mg | Freq: Once | INTRAVENOUS | Status: AC
  Administered 2017-09-15: 0.25 mg via INTRAVENOUS

## 2017-09-15 MED ORDER — FAMOTIDINE IN NACL 20-0.9 MG/50ML-% IV SOLN
INTRAVENOUS | Status: AC
  Filled 2017-09-15: qty 50

## 2017-09-15 MED ORDER — DIPHENHYDRAMINE HCL 50 MG/ML IJ SOLN
50.0000 mg | Freq: Once | INTRAMUSCULAR | Status: AC
  Administered 2017-09-15: 50 mg via INTRAVENOUS

## 2017-09-15 MED ORDER — HEPARIN SOD (PORK) LOCK FLUSH 100 UNIT/ML IV SOLN
500.0000 [IU] | Freq: Once | INTRAVENOUS | Status: AC | PRN
  Administered 2017-09-15: 500 [IU]
  Filled 2017-09-15: qty 5

## 2017-09-15 MED ORDER — ACETAMINOPHEN 325 MG PO TABS
650.0000 mg | ORAL_TABLET | Freq: Once | ORAL | Status: AC
  Administered 2017-09-15: 650 mg via ORAL

## 2017-09-15 NOTE — Telephone Encounter (Signed)
Patient wanted earlier times

## 2017-09-15 NOTE — Progress Notes (Signed)
Pt to receive 1 unit of PRBC today for hg 8.1 per Dr.Feng parameters. Pt to get 2nd unit on Saturday 1/19. Pt blood bracelet will be good for 72 hrs, starting today.

## 2017-09-15 NOTE — Progress Notes (Signed)
HPI: Lindsay [name] previously seen in the [facility] on 08/31/2017 due to a personal and family history of cancer and concerns regarding a hereditary predisposition to cancer. Please refer to our prior cancer genetics clinic note for more information regarding Lindsay [name]'s medical, social and family histories, and our assessment and recommendations, at the time. Lindsay [name] recent genetic test results were disclosed to her, as well as recommendations warranted by these results. These results and recommendations are discussed in more detail below.  CANCER HISTORY:  Oncology History   Cancer Staging Malignant neoplasm of upper-inner quadrant of left breast in female, estrogen receptor negative ([place]) Staging form: Breast, AJCC 8th Edition - Clinical stage from 06/10/2017: Stage IIIB (cT2, cN1, cM0, G3, ER: Negative, PR: Negative, HER2: Negative) - Signed by Truitt Merle, MD on 06/21/2017       Malignant neoplasm of upper-inner quadrant of left breast in female, estrogen receptor negative ([facility])   06/08/2017 Imaging    DIAGNOSTIC MAMMO and [place] Left Breast 06/08/17 IMPRESSION:  Highly suspicious palpable left breast 11 o'clock mass which measures 2.8 cm in greatest dimension. Ductal extension anteriorly to the level of the nipple, and posteriorly for additional 1.8cm, is suspicious for tumor extension.       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 Wall thickness [name]   increased in a pattern of moderate LVH. Systolic function [name]   normal. The estimated ejection fraction [name] in the range of 60%   to 65%. Wall motion [name] normal; there were no regional wall   motion abnormalities. Left ventricular diastolic function   parameters were normal. - Mitral valve: Calcified annulus. Mildly thickened leaflets . - Left atrium: The atrium [name] mildly dilated. - Atrial septum: There [name] increased thickness of the septum,   consistent with lipomatous hypertrophy. No defect or patent   foramen ovale [name] identified. - Impressions: Normal GLS - 18.6 Impressions: - Normal GLS - 18.6      06/28/2017 Imaging    CT CAP W Contrast 06/28/17 IMPRESSION: 1. Mildly enlarged left axillary lymph node at 1.5 cm, a significant change from the prior chest CT from 2015, suspicious for left axillary metastatic disease. 2. Left upper breast lesion 2.1 cm in diameter with higher density margins and lower density center, favoring lumpectomy site. 3. Mildly enlarged right hilar lymph node, but stable compared back through 2015. 4. No findings of osseous metastatic disease or additional metastatic sites. 5. Other imaging findings of potential clinical significance: Aberrant right subclavian artery passes behind the esophagus. Aortic Atherosclerosis (ICD10-I70.0) and Emphysema (ICD10-J43.9). Coronary atherosclerosis. Mild nodular enlargement of the left inferior thyroid lobe, isodense to the rest of the thyroid. Airway thickening is present, suggesting bronchitis or reactive airways disease. Left hepatic lobe cyst. Left renal cyst. Likely small cysts in the right kidney. Sigmoid  colon diverticulosis.       06/28/2017 Imaging    Bone Scan 06/28/17 IMPRESSION: No evidence of osseous metastatic disease. Subtle increased radiotracer uptake  within the left chest wall, likely within the left breast.      06/30/2017 Surgery    Port placement by Dr. [name] on 06/30/17      07/01/2017 -  Chemotherapy    Neoadjuvant Adriamycin and Cytoxan (AC) every 2 weeks for 4 cycles starting 07/01/17. Dose reduced starting with cycle 2. Compelted AC on 12.19.18. Started weekly Carbo and Taxol every on 08/31/17        07/23/2017 - 07/26/2017 Hospital Admission    Admit date: 07/23/17 Admission diagnosis: 07/26/17 Additional comments: febrile neutropenia; diverticulitis      08/29/2017 Mammogram    Diagnostic Mammogram and [place] 08/29/17  IMPRESSION:  Interval decrease in the size of known left 11 o'clock malignancy, with greatest dimension of 1.3cm.  Interval decrease in size of known left malignant axillary lymph node, with cortical thickness of 3 mm.        09/09/2017 Genetic Testing    The patient had genetic testing due to a personal history of breast cancer and skin cancer,  and a family history of breast cancer, pancreatic cancer, melanoma, and colon cancer.  The Common Hereditary Cancer Panel + Melanoma Panel + Basal Cell Nevus Syndrome Panel [name] ordered (58 genes).  The following genes were evaluated for sequence changes and exonic deletions/duplications: APC, ATM, AXIN2, BAP1, BARD1, BMPR1A, BRCA1, BRCA2, BRIP1, CDH1, CDK4, CDKN2A (p14ARF), CDKN2A (p16INK4a), CHEK2, CTNNA1, DICER1, EPCAM*, FANCC, GREM1*, KIT, MC1R, MEN1, MLH1, MSH2, MSH3, MSH6, MUTYH, NBN, NF1, PALB2, PALLD, PDGFRA, PMS2, POLD1, POLE, POT1, PTCH1, PTCH2, PTEN, RAD50, RAD51C, RAD51D, RB1, SDHB, SDHC, SDHD, SMAD4, SMARCA4, STK11, SUFU, TERT, TP53, TSC1, TSC2, VHL. The following genes were evaluated for sequence changes only:HOXB13*, MITF*, NTHL1*, SDHA  Results: Negative, no pathogenic variants identified. The date of this test report is 09/09/2017.         FAMILY HISTORY:  We obtained a detailed, 4-generation family history.  Significant diagnoses are listed  below: Family History  Problem Relation Age of Onset  . Pancreatic cancer Mother 60  . Breast cancer Sister 80       bilateral mastectomy  . Basal cell carcinoma Sister        'many'  . Breast cancer Paternal Aunt   . Breast cancer Sister 24  . Melanoma Sister   . Colon cancer Sister 78   Ms. Balsam has 2 sons ages 49 and 90 with no history of cancer.  Ms. Wogan has 4 sisters and 8 brothers listed below: -1 sister [name] diagnosed with breast cancer at 42 and had a bilateral mastectomy.  She has also had several BCC skin cancers.  She has her ovaries and uterus intact.  This sister has 3 daughters with no history of cancer.  -1 sister developed breast cancer in her 5's and died at 67.  She had 3 sons with no history of cancer.  -1 sister died of colon cancer diagnosed at 42.  She had 2 daughters and 1 son with no history of cancer.  She is an identical twin to the sister with melanoma.   -1 sister had melanoma in her 48's and is now deceased.  She is an identical twin to the sister with colon cancer. She had 2 daughters and 1 son with no history of cancer.  -1 brothers with no history of cancer.  -2 brothers had  part of their intestines removed, but she does not think it [name] due to cancer.  One of these brothers is still living, the other deceased due to heart problems.   Ms. Lager's father died at 50 due to a heart attack. He had as sister (patient's aunt) with breast cancer in her 73's.  This aunt had a son and a daughter with no history of cancer. Ms. Cuneo has several other paternal aunts/uncles with no history of cancer.  Ms. Maurer does not know any information about her paternal grandfather as he died before she [name] born, and her paternal grandmother died in her 40's with no history of cancer.   Ms. Vanhecke mother [name] diagnosed with pancreatic cancer at 16.  She had her uterus and ovaries intact when she died.  Ms. Sheard has 4 maternal aunts who had no history of cancer.  Ms. Gamboa does  not know any information about her maternal grandparents as they died before she [name] born.   Ms. Rayner is unaware of previous family history of genetic testing for hereditary cancer risks. Patient's maternal ancestors are of Caucasian descent, and paternal ancestors are of Caucasian descent. There is no reported Ashkenazi Jewish ancestry. There is no known consanguinity.  GENETIC TEST RESULTS: Genetic testing performed through [facility] Common Heredtiary Cancer Panel + Melanoma Panel + Basal Cell Nevus Panel (58 genes) reported out on 09/09/2017 showed no pathogenic mutations. The following genes were evaluated for sequence changes and exonic deletions/duplications: APC, ATM, AXIN2, BAP1, BARD1, BMPR1A, BRCA1, BRCA2, BRIP1, CDH1, CDK4, CDKN2A (p14ARF), CDKN2A (p16INK4a), CHEK2, CTNNA1, DICER1, EPCAM*, FANCC, GREM1*, KIT, MC1R, MEN1, MLH1, MSH2, MSH3, MSH6, MUTYH, NBN, NF1, PALB2, PALLD, PDGFRA, PMS2, POLD1, POLE, POT1, PTCH1, PTCH2, PTEN, RAD50, RAD51C, RAD51D, RB1, SDHB, SDHC, SDHD, SMAD4, SMARCA4, STK11, SUFU, TERT, TP53, TSC1, TSC2, VHL. The following genes were evaluated for sequence changes only: HOXB13*, MITF*, NTHL1*, SDHA.  The test report will be scanned into EPIC and will be located under the Molecular Pathology section of the Results Review tab.A portion of the result report is included below for reference.     We discussed with Ms. Greenberger that because current genetic testing is not perfect, it is possible there may be a gene mutation in one of these genes that current testing cannot detect, but that chance is small. We also discussed, that there could be another gene that has not yet been discovered, or that we have not yet tested, that is responsible for the cancer diagnoses in the family. It is also possible there is a hereditary cause for the cancer in the family that Ms. Chaplin did not inherit and therefore [name] not identified in her testing.  Therefore, it is important to remain in touch  with cancer genetics in the future so that we can continue to offer Ms. Acero the most up to date genetic testing.   ADDITIONAL GENETIC TESTING: We discussed with Ms. Mchugh that there are other genes that are associated with increased cancer risk that can be analyzed. The laboratories that offer this testing look at these additional genes via a hereditary cancer gene panel. Should Ms. Blouch wish to pursue additional genetic testing, we are happy to discuss and coordinate this testing, at any time.    CANCER SCREENING RECOMMENDATIONS:  This negative result means that we were unable to identify a hereditary cause for her personal and family history of cancer at this time.  However, this result does not completely rule out a hereditary basis for her  cancer.  It is still possible that there could be genetic mutations that are undetectable by current technology, or genetic mutations in genes that have not been tested or identified to increase cancer risk.  Her personal and family history is still somewhat suspicious for a hereditary predisposition to cancer.   Therefore, it is recommended she continue to follow the cancer management and screening guidelines provided by her oncology and primary healthcare provider. Other factors such as her personal and family history may still affect her cancer risk.  Due to her brother's history of colon cancer and her personal history of colon polyps, she should have colonoscopies more frequently than the general population at intervals determined by her gastroenterologist.    RECOMMENDATIONS FOR FAMILY MEMBERS: Individuals in this family might be at some increased risk of developing cancer, over the general population risk, simply due to the family history of cancer. We recommended women in this family have a yearly mammogram beginning at age 49, or 81 years younger than the earliest onset of cancer, an annual clinical breast exam, and perform monthly breast self-exams.  Women in this family should also have a gynecological exam as recommended by their primary provider. All family members should have a colonoscopy by age 12 (or earlier depending on family history).  All family members should inform their physicians about the family history of cancer so their doctors can make the most appropriate screening recommendations for them.   Based on Ms. Bost's family history, we recommended her siblings and maternal relatives, also have genetic counseling and testing. Ms. Haley will let us know if we can be of any assistance in coordinating genetic counseling and/or testing for these family members.   FOLLOW-UP: Lastly, we discussed with Ms. Araiza that cancer genetics is a rapidly advancing field and it is possible that new genetic tests will be appropriate for her and/or her family members in the future. We encouraged her to remain in contact with cancer genetics on an annual basis so we can update her personal and family histories and let her know of advances in cancer genetics that may benefit this family.   Our contact number [name] provided. Ms. Huron's questions were answered to her satisfaction, and she knows she is welcome to call us at anytime with additional questions or concerns.   Ferol Luz, MS Genetic Counselor [email or web address]

## 2017-09-15 NOTE — Patient Instructions (Addendum)
[facility] Discharge Instructions for Patients Receiving Chemotherapy  Today you received the following chemotherapy agents: Paclitaxel (Taxol) and Carboplatin (Paraplatin).  To help prevent nausea and vomiting after your treatment, we encourage you to take your nausea medication as prescribed. Received Aloxi during treatment-->take Compazine for next 3 days If you develop nausea and vomiting that is not controlled by your nausea medication, call the clinic.   BELOW ARE SYMPTOMS THAT SHOULD BE REPORTED IMMEDIATELY:  *FEVER GREATER THAN 100.5 F  *CHILLS WITH OR WITHOUT FEVER  NAUSEA AND VOMITING THAT IS NOT CONTROLLED WITH YOUR NAUSEA MEDICATION  *UNUSUAL SHORTNESS OF BREATH  *UNUSUAL BRUISING OR BLEEDING  TENDERNESS IN MOUTH AND THROAT WITH OR WITHOUT PRESENCE OF ULCERS  *URINARY PROBLEMS  *BOWEL PROBLEMS  UNUSUAL RASH Items with * indicate a potential emergency and should be followed up as soon as possible.  Feel free to call the clinic should you have any questions or concerns. The clinic phone number is [phone].  Please show the [place] [facility] at check-in to the Emergency Department and triage nurse.    Blood Transfusion, Care After This sheet gives you information about how to care for yourself after your procedure. Your doctor may also give you more specific instructions. If you have problems or questions, contact your doctor. Follow these instructions at home: Take over-the-counter and prescription medicines only as told by your doctor. Go back to your normal activities as told by your doctor. Follow instructions from your doctor about how to take care of the area where an IV tube was put into your vein (insertion site). Make sure you: Wash your hands with soap and water before you change your bandage (dressing). If there is no soap and water, use hand sanitizer. Change your bandage as told by your doctor. Check your IV insertion site every day  for signs of infection. Check for: More redness, swelling, or pain. More fluid or blood. Warmth. Pus or a bad smell. Contact a doctor if: You have more redness, swelling, or pain around the IV insertion site.. You have more fluid or blood coming from the IV insertion site. Your IV insertion site feels warm to the touch. You have pus or a bad smell coming from the IV insertion site. Your pee (urine) turns pink, red, or brown. You feel weak after doing your normal activities. Get help right away if: You have signs of a serious allergic or body defense (immune) system reaction, including: Itchiness. Hives. Trouble breathing. Anxiety. Pain in your chest or lower back. Fever, flushing, and chills. Fast pulse. Rash. Watery poop (diarrhea). Throwing up (vomiting). Dark pee. Serious headache. Dizziness. Stiff neck. Yellow color in your face or the white parts of your eyes (jaundice). Summary After a blood transfusion, return to your normal activities as told by your doctor. Every day, check for signs of infection where the IV tube was put into your vein. Some signs of infection are warm skin, more redness and pain, more fluid or blood, and pus or a bad smell where the needle went in. Contact your doctor if you feel weak or have any unusual symptoms. This information is not intended to replace advice given to you by your health care provider. Make sure you discuss any questions you have with your health care provider. Document Released: 09/06/2014 Document Revised: 04/09/2016 Document Reviewed: 04/09/2016 Elsevier Interactive Patient Education  2017 [employer].

## 2017-09-16 ENCOUNTER — Other Ambulatory Visit: Payer: Self-pay | Admitting: *Deleted

## 2017-09-16 DIAGNOSIS — D6481 Anemia due to antineoplastic chemotherapy: Secondary | ICD-10-CM

## 2017-09-16 DIAGNOSIS — T451X5A Adverse effect of antineoplastic and immunosuppressive drugs, initial encounter: Principal | ICD-10-CM

## 2017-09-17 ENCOUNTER — Inpatient Hospital Stay: Payer: Medicare Other

## 2017-09-17 VITALS — BP 152/62 | HR 58 | Temp 98.1°F | Resp 18

## 2017-09-17 DIAGNOSIS — E860 Dehydration: Secondary | ICD-10-CM | POA: Diagnosis not present

## 2017-09-17 DIAGNOSIS — R197 Diarrhea, unspecified: Secondary | ICD-10-CM | POA: Diagnosis not present

## 2017-09-17 DIAGNOSIS — Z5111 Encounter for antineoplastic chemotherapy: Secondary | ICD-10-CM | POA: Diagnosis not present

## 2017-09-17 DIAGNOSIS — I4891 Unspecified atrial fibrillation: Secondary | ICD-10-CM | POA: Diagnosis not present

## 2017-09-17 DIAGNOSIS — Z171 Estrogen receptor negative status [ER-]: Secondary | ICD-10-CM | POA: Diagnosis not present

## 2017-09-17 DIAGNOSIS — I10 Essential (primary) hypertension: Secondary | ICD-10-CM | POA: Diagnosis not present

## 2017-09-17 DIAGNOSIS — D6481 Anemia due to antineoplastic chemotherapy: Secondary | ICD-10-CM | POA: Diagnosis not present

## 2017-09-17 DIAGNOSIS — C50212 Malignant neoplasm of upper-inner quadrant of left female breast: Secondary | ICD-10-CM | POA: Diagnosis not present

## 2017-09-17 DIAGNOSIS — T451X5A Adverse effect of antineoplastic and immunosuppressive drugs, initial encounter: Principal | ICD-10-CM

## 2017-09-17 DIAGNOSIS — R21 Rash and other nonspecific skin eruption: Secondary | ICD-10-CM | POA: Diagnosis not present

## 2017-09-17 DIAGNOSIS — C773 Secondary and unspecified malignant neoplasm of axilla and upper limb lymph nodes: Secondary | ICD-10-CM | POA: Diagnosis not present

## 2017-09-17 DIAGNOSIS — K5732 Diverticulitis of large intestine without perforation or abscess without bleeding: Secondary | ICD-10-CM | POA: Diagnosis not present

## 2017-09-17 DIAGNOSIS — D696 Thrombocytopenia, unspecified: Secondary | ICD-10-CM | POA: Diagnosis not present

## 2017-09-17 DIAGNOSIS — R7989 Other specified abnormal findings of blood chemistry: Secondary | ICD-10-CM | POA: Diagnosis not present

## 2017-09-17 MED ORDER — DIPHENHYDRAMINE HCL 50 MG/ML IJ SOLN: 25.0000 mg | Freq: Once | INTRAMUSCULAR | Status: DC

## 2017-09-17 MED ORDER — DIPHENHYDRAMINE HCL 25 MG PO CAPS
ORAL_CAPSULE | ORAL | Status: AC
  Filled 2017-09-17: qty 1

## 2017-09-17 MED ORDER — ACETAMINOPHEN 325 MG PO TABS
650.0000 mg | ORAL_TABLET | Freq: Once | ORAL | Status: AC
  Administered 2017-09-17: 650 mg via ORAL

## 2017-09-17 MED ORDER — ACETAMINOPHEN 325 MG PO TABS
ORAL_TABLET | ORAL | Status: AC
  Filled 2017-09-17: qty 2

## 2017-09-17 MED ORDER — HEPARIN SOD (PORK) LOCK FLUSH 100 UNIT/ML IV SOLN
500.0000 [IU] | Freq: Every day | INTRAVENOUS | Status: AC | PRN
  Administered 2017-09-17: 500 [IU]
  Filled 2017-09-17: qty 5

## 2017-09-17 MED ORDER — SODIUM CHLORIDE 0.9% FLUSH
10.0000 mL | INTRAVENOUS | Status: AC | PRN
  Administered 2017-09-17: 10 mL
  Filled 2017-09-17: qty 10

## 2017-09-17 MED ORDER — DIPHENHYDRAMINE HCL 25 MG PO CAPS
25.0000 mg | ORAL_CAPSULE | Freq: Once | ORAL | Status: AC
  Administered 2017-09-17: 25 mg via ORAL

## 2017-09-17 MED ORDER — SODIUM CHLORIDE 0.9 % IV SOLN
250.0000 mL | Freq: Once | INTRAVENOUS | Status: AC
  Administered 2017-09-17: 250 mL via INTRAVENOUS

## 2017-09-17 NOTE — Patient Instructions (Signed)

## 2017-09-18 LAB — TYPE AND SCREEN
ABO/RH(D): A POS
Antibody Screen: NEGATIVE
UNIT DIVISION: 0
Unit division: 0

## 2017-09-18 LAB — BPAM RBC
BLOOD PRODUCT EXPIRATION DATE: 201902072359
Blood Product Expiration Date: 201902072359
ISSUE DATE / TIME: 201901190842
ISSUE DATE / TIME: 201901171613
UNIT TYPE AND RH: 6200
Unit Type and Rh: 6200

## 2017-09-21 ENCOUNTER — Encounter: Payer: Self-pay | Admitting: Nurse Practitioner

## 2017-09-21 ENCOUNTER — Inpatient Hospital Stay: Payer: Medicare Other

## 2017-09-21 ENCOUNTER — Inpatient Hospital Stay (HOSPITAL_BASED_OUTPATIENT_CLINIC_OR_DEPARTMENT_OTHER): Payer: Medicare Other | Admitting: Nurse Practitioner

## 2017-09-21 ENCOUNTER — Telehealth: Payer: Self-pay | Admitting: Nurse Practitioner

## 2017-09-21 VITALS — BP 163/64 | HR 70 | Temp 98.4°F | Resp 19 | Ht 67.0 in | Wt 252.6 lb

## 2017-09-21 DIAGNOSIS — C773 Secondary and unspecified malignant neoplasm of axilla and upper limb lymph nodes: Secondary | ICD-10-CM | POA: Diagnosis not present

## 2017-09-21 DIAGNOSIS — I4891 Unspecified atrial fibrillation: Secondary | ICD-10-CM | POA: Diagnosis not present

## 2017-09-21 DIAGNOSIS — Z171 Estrogen receptor negative status [ER-]: Secondary | ICD-10-CM

## 2017-09-21 DIAGNOSIS — R634 Abnormal weight loss: Secondary | ICD-10-CM | POA: Diagnosis not present

## 2017-09-21 DIAGNOSIS — K5732 Diverticulitis of large intestine without perforation or abscess without bleeding: Secondary | ICD-10-CM | POA: Diagnosis not present

## 2017-09-21 DIAGNOSIS — D649 Anemia, unspecified: Secondary | ICD-10-CM

## 2017-09-21 DIAGNOSIS — C50212 Malignant neoplasm of upper-inner quadrant of left female breast: Secondary | ICD-10-CM

## 2017-09-21 DIAGNOSIS — R630 Anorexia: Secondary | ICD-10-CM

## 2017-09-21 DIAGNOSIS — R21 Rash and other nonspecific skin eruption: Secondary | ICD-10-CM

## 2017-09-21 DIAGNOSIS — R197 Diarrhea, unspecified: Secondary | ICD-10-CM | POA: Diagnosis not present

## 2017-09-21 DIAGNOSIS — R7989 Other specified abnormal findings of blood chemistry: Secondary | ICD-10-CM | POA: Diagnosis not present

## 2017-09-21 DIAGNOSIS — E860 Dehydration: Secondary | ICD-10-CM | POA: Diagnosis not present

## 2017-09-21 DIAGNOSIS — T451X5A Adverse effect of antineoplastic and immunosuppressive drugs, initial encounter: Secondary | ICD-10-CM | POA: Diagnosis not present

## 2017-09-21 DIAGNOSIS — L270 Generalized skin eruption due to drugs and medicaments taken internally: Secondary | ICD-10-CM

## 2017-09-21 DIAGNOSIS — I10 Essential (primary) hypertension: Secondary | ICD-10-CM | POA: Diagnosis not present

## 2017-09-21 DIAGNOSIS — Z95828 Presence of other vascular implants and grafts: Secondary | ICD-10-CM

## 2017-09-21 DIAGNOSIS — D6481 Anemia due to antineoplastic chemotherapy: Secondary | ICD-10-CM | POA: Diagnosis not present

## 2017-09-21 DIAGNOSIS — Z5111 Encounter for antineoplastic chemotherapy: Secondary | ICD-10-CM | POA: Diagnosis not present

## 2017-09-21 DIAGNOSIS — D696 Thrombocytopenia, unspecified: Secondary | ICD-10-CM | POA: Diagnosis not present

## 2017-09-21 LAB — COMPREHENSIVE METABOLIC PANEL
ALBUMIN: 3.5 g/dL (ref 3.5–5.0)
ALK PHOS: 49 U/L (ref 40–150)
ALT: 24 U/L (ref 0–55)
AST: 23 U/L (ref 5–34)
Anion gap: 8 (ref 3–11)
BILIRUBIN TOTAL: 0.6 mg/dL (ref 0.2–1.2)
BUN: 30 mg/dL — AB (ref 7–26)
CALCIUM: 9.4 mg/dL (ref 8.4–10.4)
CO2: 27 mmol/L (ref 22–29)
CREATININE: 0.98 mg/dL (ref 0.60–1.10)
Chloride: 103 mmol/L (ref 98–109)
GFR calc Af Amer: >60 mL/min (ref >60)
GFR calc non Af Amer: 55 mL/min — ABNORMAL LOW (ref >60)
GLUCOSE: 92 mg/dL (ref 70–140)
Potassium: 4.1 mmol/L (ref 3.3–4.7)
SODIUM: 138 mmol/L (ref 136–145)
TOTAL PROTEIN: 6.1 g/dL — AB (ref 6.4–8.3)

## 2017-09-21 LAB — CBC WITH DIFFERENTIAL/PLATELET
BASOS PCT: 1 %
Basophils Absolute: 0 10*3/uL (ref 0.0–0.1)
EOS ABS: 0 10*3/uL (ref 0.0–0.5)
Eosinophils Relative: 0 %
HCT: 31.6 % — ABNORMAL LOW (ref 34.8–46.6)
HEMOGLOBIN: 10.3 g/dL — AB (ref 11.6–15.9)
Lymphocytes Relative: 18 %
Lymphs Abs: 0.7 10*3/uL — ABNORMAL LOW (ref 0.9–3.3)
MCH: 30.3 pg (ref 25.1–34.0)
MCHC: 32.6 g/dL (ref 31.5–36.0)
MCV: 92.9 fL (ref 79.5–101.0)
MONOS PCT: 7 %
Monocytes Absolute: 0.3 10*3/uL (ref 0.1–0.9)
NEUTROS PCT: 74 %
Neutro Abs: 3 10*3/uL (ref 1.5–6.5)
Platelets: 140 10*3/uL — ABNORMAL LOW (ref 145–400)
RBC: 3.4 MIL/uL — AB (ref 3.70–5.45)
RDW: 16.6 % — ABNORMAL HIGH (ref 11.2–16.1)
WBC: 4 10*3/uL (ref 3.9–10.3)

## 2017-09-21 MED ORDER — PALONOSETRON HCL INJECTION 0.25 MG/5ML
INTRAVENOUS | Status: AC
  Filled 2017-09-21: qty 5

## 2017-09-21 MED ORDER — DEXAMETHASONE SODIUM PHOSPHATE 10 MG/ML IJ SOLN
INTRAMUSCULAR | Status: AC
  Filled 2017-09-21: qty 1

## 2017-09-21 MED ORDER — HEPARIN SOD (PORK) LOCK FLUSH 100 UNIT/ML IV SOLN
500.0000 [IU] | Freq: Once | INTRAVENOUS | Status: AC | PRN
  Administered 2017-09-21: 500 [IU]
  Filled 2017-09-21: qty 5

## 2017-09-21 MED ORDER — SODIUM CHLORIDE 0.9 % IV SOLN: 10.0000 mg | Freq: Once | INTRAVENOUS | Status: DC

## 2017-09-21 MED ORDER — SODIUM CHLORIDE 0.9% FLUSH
10.0000 mL | INTRAVENOUS | Status: DC | PRN
  Administered 2017-09-21: 10 mL via INTRAVENOUS
  Filled 2017-09-21: qty 10

## 2017-09-21 MED ORDER — DEXAMETHASONE SODIUM PHOSPHATE 10 MG/ML IJ SOLN
10.0000 mg | Freq: Once | INTRAMUSCULAR | Status: AC
  Administered 2017-09-21: 10 mg via INTRAVENOUS

## 2017-09-21 MED ORDER — PALONOSETRON HCL INJECTION 0.25 MG/5ML
0.2500 mg | Freq: Once | INTRAVENOUS | Status: AC
  Administered 2017-09-21: 0.25 mg via INTRAVENOUS

## 2017-09-21 MED ORDER — SODIUM CHLORIDE 0.9 % IV SOLN
181.6500 mg | Freq: Once | INTRAVENOUS | Status: AC
  Administered 2017-09-21: 180 mg via INTRAVENOUS
  Filled 2017-09-21: qty 18

## 2017-09-21 MED ORDER — PACLITAXEL PROTEIN-BOUND CHEMO INJECTION 100 MG
80.0000 mg/m2 | Freq: Once | INTRAVENOUS | Status: AC
  Administered 2017-09-21: 200 mg via INTRAVENOUS
  Filled 2017-09-21: qty 40

## 2017-09-21 MED ORDER — SODIUM CHLORIDE 0.9 % IV SOLN
Freq: Once | INTRAVENOUS | Status: AC
  Administered 2017-09-21: 12:00:00 via INTRAVENOUS

## 2017-09-21 MED ORDER — SODIUM CHLORIDE 0.9% FLUSH
10.0000 mL | INTRAVENOUS | Status: DC | PRN
  Administered 2017-09-21: 10 mL
  Filled 2017-09-21: qty 10

## 2017-09-21 NOTE — Telephone Encounter (Signed)
Gave avs and calendar for February and march Lindsay [name] will see in infusion

## 2017-09-21 NOTE — Progress Notes (Addendum)
Lindsay [name]  [phone] [phone]  Clinic Follow up Note   Patient Care Team: Lindsay Hipps, MD as PCP - General (Family Medicine) Lindsay Keens, MD as Consulting Physician (General Surgery) Lindsay Priest, MD as Consulting Physician (Cardiology) 09/21/2017  CHIEF COMPLAINT: Follow up left breast cancer, triple negative, stage IIIB  SUMMARY OF ONCOLOGIC HISTORY: Oncology History   Cancer Staging Malignant neoplasm of upper-inner quadrant of left breast in female, estrogen receptor negative ([place]) Staging form: Breast, AJCC 8th Edition - Clinical stage from 06/10/2017: Stage IIIB (cT2, cN1, cM0, G3, ER: Negative, PR: Negative, HER2: Negative) - Signed by Lindsay Merle, MD on 06/21/2017       Malignant neoplasm of upper-inner quadrant of left breast in female, estrogen receptor negative ([place])   06/08/2017 Imaging    DIAGNOSTIC MAMMO and [place] Left Breast 06/08/17 IMPRESSION:  Highly suspicious palpable left breast 11 o'clock mass which measures 2.8 cm in greatest dimension. Ductal extension anteriorly to the level of the nipple, and posteriorly for additional 1.8cm, is suspicious for tumor extension.       06/10/2017 Initial Diagnosis    Malignant neoplasm of upper-inner quadrant of left breast in female, estrogen receptor negative ([place])      06/10/2017 Pathology Results    Diagnosis 06/10/17 1.) Breast, left, needle core biopsy -INVASIVE DUCTAL CARCINOMA, GRADE 3, WITH FOCAL HIGH GRADE DUCTAL CARCINOMA IN SITU -NEOPLASM INVOLVES ALL CORES AND MEASURES APPROXIMATELY 1.5CM IN MAXIMAL LINEAR DIMENSION  2.) Lymph node, needle/core biopsy, left axilla -METASTATIC CARCINOMA CONSISTENT WITH BREAST PRIMARY       06/10/2017 Receptors her2    ER: -, PR-, HER2 not amplified       06/23/2017 Echocardiogram    ECHO 06/23/17 Study Conclusions - Left ventricle: The cavity size was normal. Wall thickness was   increased in a pattern of  moderate LVH. Systolic function was   normal. The estimated ejection fraction was in the range of 60%   to 65%. Wall motion was normal; there were no regional wall   motion abnormalities. Left ventricular diastolic function   parameters were normal. - Mitral valve: Calcified annulus. Mildly thickened leaflets . - Left atrium: The atrium was mildly dilated. - Atrial septum: There was increased thickness of the septum,   consistent with lipomatous hypertrophy. No defect or patent   foramen ovale was identified. - Impressions: Normal GLS - 18.6 Impressions: - Normal GLS - 18.6      06/28/2017 Imaging    CT CAP W Contrast 06/28/17 IMPRESSION: 1. Mildly enlarged left axillary lymph node at 1.5 cm, a significant change from the prior chest CT from 2015, suspicious for left axillary metastatic disease. 2. Left upper breast lesion 2.1 cm in diameter with higher density margins and lower density center, favoring lumpectomy site. 3. Mildly enlarged right hilar lymph node, but stable compared back through 2015. 4. No findings of osseous metastatic disease or additional metastatic sites. 5. Other imaging findings of potential clinical significance: Aberrant right subclavian artery passes behind the esophagus. Aortic Atherosclerosis (ICD10-I70.0) and Emphysema (ICD10-J43.9). Coronary atherosclerosis. Mild nodular enlargement of the left inferior thyroid lobe, isodense to the rest of the thyroid. Airway thickening is present, suggesting bronchitis or reactive airways disease. Left hepatic lobe cyst. Left renal cyst. Likely small cysts in the right kidney. Sigmoid colon diverticulosis.       06/28/2017 Imaging    Bone Scan 06/28/17 IMPRESSION: No evidence of osseous metastatic disease. Subtle increased radiotracer uptake within the left  chest wall, likely within the left breast.      06/30/2017 Surgery    Port placement by Dr. Ninfa [name] on 06/30/17      07/01/2017 -  Chemotherapy     Neoadjuvant Adriamycin and Cytoxan (AC) every 2 weeks for 4 cycles starting 07/01/17. Dose reduced starting with cycle 2. Compelted AC on 12.19.18. Started weekly Carbo and Taxol every on 08/31/17        07/23/2017 - 07/26/2017 Hospital Admission    Admit date: 07/23/17 Admission diagnosis: 07/26/17 Additional comments: febrile neutropenia; diverticulitis      08/29/2017 Mammogram    Diagnostic Mammogram and [place] 08/29/17  IMPRESSION:  Interval decrease in the size of known left 11 o'clock malignancy, with greatest dimension of 1.3cm.  Interval decrease in size of known left malignant axillary lymph node, with cortical thickness of 3 mm.        09/09/2017 Genetic Testing    The patient had genetic testing due to a personal history of breast cancer and skin cancer,  and a family history of breast cancer, pancreatic cancer, melanoma, and colon cancer.  The Common Hereditary Cancer Panel + Melanoma Panel + Basal Cell Nevus Syndrome Panel was ordered (58 genes).  The following genes were evaluated for sequence changes and exonic deletions/duplications: APC, ATM, AXIN2, BAP1, BARD1, BMPR1A, BRCA1, BRCA2, BRIP1, CDH1, CDK4, CDKN2A (p14ARF), CDKN2A (p16INK4a), CHEK2, CTNNA1, DICER1, EPCAM*, FANCC, GREM1*, KIT, MC1R, MEN1, MLH1, MSH2, MSH3, MSH6, MUTYH, NBN, NF1, PALB2, PALLD, PDGFRA, PMS2, POLD1, POLE, POT1, PTCH1, PTCH2, PTEN, RAD50, RAD51C, RAD51D, RB1, SDHB, SDHC, SDHD, SMAD4, SMARCA4, STK11, SUFU, TERT, TP53, TSC1, TSC2, VHL. The following genes were evaluated for sequence changes only:HOXB13*, MITF*, NTHL1*, SDHA  Results: Negative, no pathogenic variants identified. The date of this test report is 09/09/2017.      CURRENT THERAPY:  Neoadjuvant Adriamycin and Cytoxan (AC) every 2 weeks for 4 cycles starting 07/01/17,  reduced 10% due to poor toleration starting with cycle 2. Completed on 08/17/17. Start Carbo and taxol weekly for 12 cycles 08/31/17. Taxol changed to abraxane on 1/23 due to suspected  taxol drug rash, carboplatin decreased with cycle 4 for mild thrombocytopenia  INTERVAL HISTORY: Lindsay [name] returns for follow-up as scheduled prior to cycle 4 carbose/Taxol. Last treated on 09/15/17 and received blood transfusion on 1/17 and 1/19. Notes improvement in her energy level and dyspnea. Appetite is present, eating and drinking well with occasional protein drink supplement. Has periodic diarrhea beginning 1-2 days after chemo and lasts 1-2 days, improves with imodium; no nausea, vomiting, or constipation. Notes non-itching flat rash to both arms, chest, face, and back that began after chemo change from [facility] to carbo/taxol. Daughter-in-law believes it has gotten worse since last chemo. Has been applying coconut oil without much relief. Denies fever, chills, cough, numbness/tingling, or bleeding. Denies changes in her breasts, left breast mass is not palpable. She does frequent self exams.   REVIEW OF SYSTEMS:   Constitutional: Denies fevers, chills (+) abnormal weight loss (+) good appetite (+) fatigue, improved after RBCs Eyes: Denies blurriness of vision Ears, nose, mouth, throat, and face: Denies mucositis or sore throat Respiratory: Denies cough or wheezes (+) dyspnea on exertion, improved after RBC transfusion Cardiovascular: Denies palpitation, chest discomfort or lower extremity swelling Gastrointestinal:  Denies nausea, vomiting, constipation, heartburn, hematochezia, or change in bowel habits (+) diarrhea begins 1-2 days after chemo, lasts 1-2 days; controlled with imodium  Skin: (+) abnormal skin rash to arms, chest, face, back, non-itching  Lymphatics: Denies new lymphadenopathy  or easy bruising Neurological:Denies numbness, tingling or new weaknesses Behavioral/Psych: Mood is stable, no new changes  All other systems were reviewed with the patient and are negative.  MEDICAL HISTORY:  Past Medical History:  Diagnosis Date  . Breast cancer ([place])   . Cancer [facility])    left breast  cancer  . Dysrhythmia    PACs and nonsustained runs of atrial tach by Holter 10/13/16  . Family history of breast cancer   . Family history of colon cancer   . Family history of melanoma   . Family history of pancreatic cancer   . History of kidney stones   . Hypertension   . Personal history of colonic polyps   . Personal history of skin cancer     SURGICAL HISTORY: Past Surgical History:  Procedure Laterality Date  . COLONOSCOPY    . FOOT SURGERY Right   . IR CV LINE INJECTION  08/02/2017  . left breast lumpectomy  2000  . PORTACATH PLACEMENT Right 06/30/2017   Procedure: [place];  Surgeon: Lindsay Keens, MD;  Location: [place];  Service: General;  Laterality: Right;    I have reviewed the social history and family history with the patient and they are unchanged from previous note.  ALLERGIES:  has No Known Allergies.  MEDICATIONS:  Current Outpatient Medications  Medication Sig Dispense Refill  . acebutolol (SECTRAL) 200 MG capsule Take 200 mg by mouth 2 (two) times daily.    Marland Kitchen aspirin EC 81 MG tablet Take 81 mg by mouth daily.    Marland Kitchen atorvastatin (LIPITOR) 10 MG tablet Take 10 mg by mouth at bedtime.    . calcium carbonate (OS-CAL) 600 MG TABS tablet Take 1,200 mg by mouth daily.    . Cholecalciferol (VITAMIN D3) 2000 units TABS Take 2,000 Units by mouth daily.     . cloNIDine (CATAPRES) 0.1 MG tablet Take 0.1 mg by mouth 2 (two) times daily.    . DOCOSAHEXAENOIC ACID PO Take 1,200 mg by mouth 2 (two) times daily.    Marland Kitchen escitalopram (LEXAPRO) 5 MG tablet Take 5 mg by mouth daily.    . hydrALAZINE (APRESOLINE) 25 MG tablet Take 12.5 mg by mouth 3 (three) times daily.    Marland Kitchen lidocaine-prilocaine (EMLA) cream Apply 1 application topically as needed. 30 g 2  . loratadine (CLARITIN) 10 MG tablet Take 10 mg by mouth daily.    Marland Kitchen LORazepam (ATIVAN) 1 MG tablet Take 1 tablet (1 mg total) by mouth once as needed for anxiety. Take 1 tab 1 hour before MRI scan 2  tablet 0  . magnesium oxide (MAG-OX) 400 MG tablet Take 400 mg by mouth daily.    . Multiple Vitamins-Minerals (EYE VITAMINS PO) Take 1 capsule by mouth 2 (two) times daily.    Marland Kitchen olmesartan-hydrochlorothiazide (BENICAR HCT) 40-25 MG tablet Take 1 tablet by mouth daily.  1  . vitamin B-12 (CYANOCOBALAMIN) 1000 MCG tablet Take 1 tablet by mouth daily.    . ondansetron (ZOFRAN) 8 MG tablet Take 1 tablet (8 mg total) by mouth every 8 (eight) hours as needed for nausea or vomiting. (Patient not taking: Reported on 09/21/2017) 20 tablet 2  . prochlorperazine (COMPAZINE) 10 MG tablet Take 1 tablet (10 mg total) by mouth every 6 (six) hours as needed for nausea or vomiting. (Patient not taking: Reported on 09/21/2017) 30 tablet 2   No current facility-administered medications for this visit.    Facility-Administered Medications Ordered in Other Visits  Medication  Dose Route Frequency Provider Last Rate Last Dose  . sodium chloride flush (NS) 0.9 % injection 10 mL  10 mL Intravenous PRN Lindsay Merle, MD   10 mL at 07/29/17 1310    PHYSICAL EXAMINATION: ECOG PERFORMANCE STATUS: 2 - Symptomatic, <50% confined to bed  Vitals:   09/21/17 1142  BP: (!) 163/64  Pulse: 70  Resp: 19  Temp: 98.4 F (36.9 C)  SpO2: 97%   Filed Weights   09/21/17 1142  Weight: 252 lb 9.6 oz (114.6 kg)    GENERAL:alert, no distress and comfortable SKIN: skin color, texture, turgor are normal (+) dry skin to back (+) scattered and numerous erythematous flat plaques of varying size to bilateral arms, upper chest, and faintly erythematous rash to face and upper back  EYES: normal, Conjunctiva are pink and non-injected, sclera clear OROPHARYNX:no exudate, no erythema and lips, buccal mucosa, and tongue normal  NECK: supple, thyroid normal size, non-tender, without nodularity LYMPH:  no palpable cervical, supraclavicular, axillary, or inguinal lymphadenopathy  LUNGS: clear to auscultation bilaterally with normal breathing  effort HEART: regular rate & rhythm and no murmurs and no lower extremity edema ABDOMEN:abdomen soft, non-tender and normal bowel sounds Musculoskeletal:no cyanosis of digits and no clubbing  NEURO: alert & oriented x 3 with fluent speech, no focal motor/sensory deficits BREASTS: exam deferred   LABORATORY DATA:  I have reviewed the data as listed CBC Latest Ref Rng & Units 09/21/2017 09/15/2017 09/07/2017  WBC 3.9 - 10.3 K/uL 4.0 3.9 6.7  Hemoglobin 11.6 - 15.9 g/dL 10.3(L) 8.1(L) 8.7(L)  Hematocrit 34.8 - 46.6 % 31.6(L) 25.4(L) 27.1(L)  Platelets 145 - 400 K/uL 140(L) 211 328     CMP Latest Ref Rng & Units 09/21/2017 09/15/2017 09/07/2017  Glucose 70 - 140 mg/dL 92 94 114  BUN 7 - 26 mg/dL 30(H) 30(H) 27(H)  Creatinine 0.60 - 1.10 mg/dL 0.98 1.21(H) 1.07  Sodium 136 - 145 mmol/L 138 140 138  Potassium 3.3 - 4.7 mmol/L 4.1 4.5 4.1  Chloride 98 - 109 mmol/L 103 103 101  CO2 22 - 29 mmol/L _0 Calcium 8.4 - 10.4 mg/dL 9.4 9.0 9.5  Total Protein 6.4 - 8.3 g/dL 6.1(L) 6.1(L) 6.1(L)  Total Bilirubin 0.2 - 1.2 mg/dL 0.6 0.4 0.5  Alkaline Phos 40 - 150 U/L 49 49 55  AST 5 - 34 U/L _1 ALT 0 - 55 U/L _2 PATHOLOGY:  Diagnosis 06/10/17 1.) Breast, left, needle core biopsy -INVASIVE DUCTAL CARCINOMA, GRADE 3, WITH FOCAL HIGH GRADE DUCTAL CARCINOMA IN SITU -NEOPLASM INVOLVES ALL CORES AND MEASURES APPROXIMATELY 1.5CM IN MAXIMAL LINEAR DIMENSION -A BREAST PROGNOSTIC PROFILE WILL BE ORDERED ON BLOCK 1A AND SEPARATELY REPORTED   2.) Lymph node, needle/core biopsy, left axilla -METASTATIC CARCINOMA CONSISTENT WITH BREAST PRIMARY    PROCEDURES  ECHO 06/23/17 Study Conclusions - Left ventricle: The cavity size was normal. Wall thickness was   increased in a pattern of moderate LVH. Systolic function was   normal. The estimated ejection fraction was in the range of 60%   to 65%. Wall motion was normal; there were no regional wall   motion abnormalities. Left  ventricular diastolic function   parameters were normal. - Mitral valve: Calcified annulus. Mildly thickened leaflets . - Left atrium: The atrium was mildly dilated. - Atrial septum: There was increased thickness of the septum,   consistent with lipomatous hypertrophy. No defect or patent   foramen ovale was identified. -  Impressions: Normal GLS - 18.6 Impressions: - Normal GLS - 18.6    RADIOGRAPHIC STUDIES: I have personally reviewed the radiological images as listed and agreed with the findings in the report. No results found.   ASSESSMENT & PLAN: Lindsay [name] is a wonderful 72 y.o. female who presents today to discuss the ongoing management of her left breast cancer.   1. 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  2. Afib, HTN 3. Genetics 4. Diverticulitis requiring hospitalization and IV antibiotics 5. Decreased appetite, weight loss 6. Skin rash - face, chest, arms, back, likely related to Taxol 7. Diarrhea   Ms. Debono appears stable today. She has completed 3 cycles neoadjuvant carbo/taxol but has developed a rash on her face, chest, arms, and back suspicious for taxol allergy. Suggested she apply topical hydrocortisone to affected areas and take po benadryl at night for itching as needed. Dr. Burr Medico recommends changing therapy to abraxane. Hgb improved to 10.3 after RBC transfusion, fatigue and dyspnea improved also. Mild thrombocytopenia with plt 140K today. Dr. Burr Medico recommends dose reduction of carboplatin. May require holding carboplatin next week or in the future if thrombocytopenia worsens. She has mild weight loss despite good appetite. I recommend she increase nutrition supplements to maintain her weight while on chemotherapy.   Her breast mass was not palpable, interim mammogram shows interval decrease in size of left breast mass and left axilla mass which  indicates response to therapy; continue current therapy as planned. Dr. Burr Medico reviewed genetic testing results which are negative for pathogenic variant. She will return for f/u in 1 week before next cycle to monitor rash.  PLAN -Discontinue taxol due to suspected drug rash, change to abraxane/carboplatin; reduce carbo for mild thrombocytopenia -Begin topical hydrocortisone for drug rash, po benadryl at night PRN for itching -Add f/u next week to monitor rash -Continue weekly chemotherapy  All questions were answered. The patient knows to call the clinic with any problems, questions or concerns. No barriers to learning was detected.    Alla Feeling, NP 09/21/17   Addendum  I have seen the patient, examined her. I agree with the assessment and and plan and have edited the notes.   Ms Kato has developed worsening skin rash, especially on her arms and upper chest.  This is likely allergy reaction to Taxol.  I will change her Taxol to Abraxane.  She also developed mild thrombocytopenia, I will reduce her carbo dose to AUC 1.5.  She otherwise is tolerating chemotherapy well, will continue new adjuvant treatment.  Lindsay [name]  09/21/2017

## 2017-09-21 NOTE — Patient Instructions (Signed)
[place] Discharge Instructions for Patients Receiving Chemotherapy  Today you received the following chemotherapy agents: Abraxane and Carboplatin.  To help prevent nausea and vomiting after your treatment, we encourage you to take your nausea medication as prescribed.   If you develop nausea and vomiting that is not controlled by your nausea medication, call the clinic.   BELOW ARE SYMPTOMS THAT SHOULD BE REPORTED IMMEDIATELY:  *FEVER GREATER THAN 100.5 F  *CHILLS WITH OR WITHOUT FEVER  NAUSEA AND VOMITING THAT IS NOT CONTROLLED WITH YOUR NAUSEA MEDICATION  *UNUSUAL SHORTNESS OF BREATH  *UNUSUAL BRUISING OR BLEEDING  TENDERNESS IN MOUTH AND THROAT WITH OR WITHOUT PRESENCE OF ULCERS  *URINARY PROBLEMS  *BOWEL PROBLEMS  UNUSUAL RASH Items with * indicate a potential emergency and should be followed up as soon as possible.  Feel free to call the clinic should you have any questions or concerns. The clinic phone number is [phone].  Please show the [place] at check-in to the Emergency Department and triage nurse.  (Abraxane) Nanoparticle Albumin-Bound Paclitaxel injection What is this medicine? NANOPARTICLE ALBUMIN-BOUND PACLITAXEL (Na no PAHR ti kuhl al BYOO muhn-bound PAK li TAX el) is a chemotherapy drug. It targets fast dividing cells, like cancer cells, and causes these cells to die. This medicine is used to treat advanced breast cancer and advanced lung cancer. This medicine may be used for other purposes; ask your health care provider or pharmacist if you have questions. COMMON BRAND NAME(S): Abraxane What should I tell my health care provider before I take this medicine? They need to know if you have any of these conditions: -kidney disease -liver disease -low blood counts, like low platelets, red blood cells, or white blood cells -recent or ongoing radiation therapy -an unusual or allergic reaction to paclitaxel, albumin, other  chemotherapy, other medicines, foods, dyes, or preservatives -pregnant or trying to get pregnant -breast-feeding How should I use this medicine? This drug is given as an infusion into a vein. It is administered in a hospital or clinic by a specially trained health care professional. Talk to your pediatrician regarding the use of this medicine in children. Special care may be needed. Overdosage: If you think you have taken too much of this medicine contact a poison control center or emergency room at once. NOTE: This medicine is only for you. Do not share this medicine with others. What if I miss a dose? It is important not to miss your dose. Call your doctor or health care professional if you are unable to keep an appointment. What may interact with this medicine? -cyclosporine -diazepam -ketoconazole -medicines to increase blood counts like filgrastim, pegfilgrastim, sargramostim -other chemotherapy drugs like cisplatin, doxorubicin, epirubicin, etoposide, teniposide, vincristine -quinidine -testosterone -vaccines -verapamil Talk to your doctor or health care professional before taking any of these medicines: -acetaminophen -aspirin -ibuprofen -ketoprofen -naproxen This list may not describe all possible interactions. Give your health care provider a list of all the medicines, herbs, non-prescription drugs, or dietary supplements you use. Also tell them if you smoke, drink alcohol, or use illegal drugs. Some items may interact with your medicine. What should I watch for while using this medicine? Your condition will be monitored carefully while you are receiving this medicine. You will need important blood work done while you are taking this medicine. This medicine can cause serious allergic reactions. If you experience allergic reactions like skin rash, itching or hives, swelling of the face, lips, or tongue, tell your doctor or  health care professional right away. In some cases, you  may be given additional medicines to help with side effects. Follow all directions for their use. This drug may make you feel generally unwell. This is not uncommon, as chemotherapy can affect healthy cells as well as cancer cells. Report any side effects. Continue your course of treatment even though you feel ill unless your doctor tells you to stop. Call your doctor or health care professional for advice if you get a fever, chills or sore throat, or other symptoms of a cold or flu. Do not treat yourself. This drug decreases your body's ability to fight infections. Try to avoid being around people who are sick. This medicine may increase your risk to bruise or bleed. Call your doctor or health care professional if you notice any unusual bleeding. Be careful brushing and flossing your teeth or using a toothpick because you may get an infection or bleed more easily. If you have any dental work done, tell your dentist you are receiving this medicine. Avoid taking products that contain aspirin, acetaminophen, ibuprofen, naproxen, or ketoprofen unless instructed by your doctor. These medicines may hide a fever. Do not become pregnant while taking this medicine. Women should inform their doctor if they wish to become pregnant or think they might be pregnant. There is a potential for serious side effects to an unborn child. Talk to your health care professional or pharmacist for more information. Do not breast-feed an infant while taking this medicine. Men are advised not to father a child while receiving this medicine. What side effects may I notice from receiving this medicine? Side effects that you should report to your doctor or health care professional as soon as possible: -allergic reactions like skin rash, itching or hives, swelling of the face, lips, or tongue -low blood counts - This drug may decrease the number of white blood cells, red blood cells and platelets. You may be at increased risk for  infections and bleeding. -signs of infection - fever or chills, cough, sore throat, pain or difficulty passing urine -signs of decreased platelets or bleeding - bruising, pinpoint red spots on the skin, black, tarry stools, nosebleeds -signs of decreased red blood cells - unusually weak or tired, fainting spells, lightheadedness -breathing problems -changes in vision -chest pain -high or low blood pressure -mouth sores -nausea and vomiting -pain, swelling, redness or irritation at the injection site -pain, tingling, numbness in the hands or feet -slow or irregular heartbeat -swelling of the ankle, feet, hands Side effects that usually do not require medical attention (report to your doctor or health care professional if they continue or are bothersome): -aches, pains -changes in the color of fingernails -diarrhea -hair loss -loss of appetite This list may not describe all possible side effects. Call your doctor for medical advice about side effects. You may report side effects to FDA at 1-800-FDA-1088. Where should I keep my medicine? This drug is given in a hospital or clinic and will not be stored at home. NOTE: This sheet is a summary. It may not cover all possible information. If you have questions about this medicine, talk to your doctor, pharmacist, or health care provider.  2018 Elsevier/Gold Standard (2015-06-18 10:05:20)

## 2017-09-22 ENCOUNTER — Telehealth: Payer: Self-pay | Admitting: *Deleted

## 2017-09-22 NOTE — Telephone Encounter (Signed)
TCT patient s/p 1st time abraxane and carboplatin.  Spoke with patient. She states she is doing well. No  C/o any side effects at this time. Had rash on arms from prior chemo, patient states it is improving with hydrocortisone cream.  No questions or concerns at this time.

## 2017-09-22 NOTE — Telephone Encounter (Signed)
-----   Message from Ma Hillock, RN sent at 09/21/2017  2:52 PM EST ----- Regarding: Burr Medico: 1st Chemo F/U Patient received first dose of abraxane and tolerated the infusion without any difficulty. She is also getting carboplatin. She was previously getting taxol/carbo but developed a rash. Please call patient to follow up.

## 2017-09-28 ENCOUNTER — Other Ambulatory Visit: Payer: Medicare Other

## 2017-09-28 ENCOUNTER — Inpatient Hospital Stay: Payer: Medicare Other

## 2017-09-28 ENCOUNTER — Encounter: Payer: Self-pay | Admitting: Nurse Practitioner

## 2017-09-28 ENCOUNTER — Ambulatory Visit: Payer: Medicare Other

## 2017-09-28 ENCOUNTER — Inpatient Hospital Stay (HOSPITAL_BASED_OUTPATIENT_CLINIC_OR_DEPARTMENT_OTHER): Payer: Medicare Other | Admitting: Nurse Practitioner

## 2017-09-28 VITALS — BP 144/66 | HR 70 | Temp 98.7°F | Wt 250.5 lb

## 2017-09-28 DIAGNOSIS — C773 Secondary and unspecified malignant neoplasm of axilla and upper limb lymph nodes: Secondary | ICD-10-CM | POA: Diagnosis not present

## 2017-09-28 DIAGNOSIS — R21 Rash and other nonspecific skin eruption: Secondary | ICD-10-CM | POA: Diagnosis not present

## 2017-09-28 DIAGNOSIS — Z171 Estrogen receptor negative status [ER-]: Principal | ICD-10-CM

## 2017-09-28 DIAGNOSIS — E860 Dehydration: Secondary | ICD-10-CM | POA: Diagnosis not present

## 2017-09-28 DIAGNOSIS — R7989 Other specified abnormal findings of blood chemistry: Secondary | ICD-10-CM | POA: Diagnosis not present

## 2017-09-28 DIAGNOSIS — R630 Anorexia: Secondary | ICD-10-CM

## 2017-09-28 DIAGNOSIS — C50212 Malignant neoplasm of upper-inner quadrant of left female breast: Secondary | ICD-10-CM | POA: Diagnosis not present

## 2017-09-28 DIAGNOSIS — Z95828 Presence of other vascular implants and grafts: Secondary | ICD-10-CM

## 2017-09-28 DIAGNOSIS — D6481 Anemia due to antineoplastic chemotherapy: Secondary | ICD-10-CM

## 2017-09-28 DIAGNOSIS — R634 Abnormal weight loss: Secondary | ICD-10-CM | POA: Diagnosis not present

## 2017-09-28 DIAGNOSIS — I4891 Unspecified atrial fibrillation: Secondary | ICD-10-CM | POA: Diagnosis not present

## 2017-09-28 DIAGNOSIS — D696 Thrombocytopenia, unspecified: Secondary | ICD-10-CM | POA: Diagnosis not present

## 2017-09-28 DIAGNOSIS — I10 Essential (primary) hypertension: Secondary | ICD-10-CM

## 2017-09-28 DIAGNOSIS — R197 Diarrhea, unspecified: Secondary | ICD-10-CM

## 2017-09-28 DIAGNOSIS — T451X5A Adverse effect of antineoplastic and immunosuppressive drugs, initial encounter: Secondary | ICD-10-CM | POA: Diagnosis not present

## 2017-09-28 DIAGNOSIS — Z5111 Encounter for antineoplastic chemotherapy: Secondary | ICD-10-CM | POA: Diagnosis not present

## 2017-09-28 DIAGNOSIS — L270 Generalized skin eruption due to drugs and medicaments taken internally: Secondary | ICD-10-CM

## 2017-09-28 DIAGNOSIS — K5732 Diverticulitis of large intestine without perforation or abscess without bleeding: Secondary | ICD-10-CM | POA: Diagnosis not present

## 2017-09-28 LAB — COMPREHENSIVE METABOLIC PANEL
ALT: 20 U/L (ref 0–55)
AST: 23 U/L (ref 5–34)
Albumin: 3.5 g/dL (ref 3.5–5.0)
Alkaline Phosphatase: 51 U/L (ref 40–150)
Anion gap: 9 (ref 3–11)
BILIRUBIN TOTAL: 0.6 mg/dL (ref 0.2–1.2)
BUN: 46 mg/dL — AB (ref 7–26)
CO2: 27 mmol/L (ref 22–29)
CREATININE: 1.3 mg/dL — AB (ref 0.60–1.10)
Calcium: 8.9 mg/dL (ref 8.4–10.4)
Chloride: 102 mmol/L (ref 98–109)
GFR calc Af Amer: 46 mL/min — ABNORMAL LOW (ref >60)
GFR, EST NON AFRICAN AMERICAN: 39 mL/min — AB (ref >60)
Glucose, Bld: 99 mg/dL (ref 70–140)
POTASSIUM: 4.2 mmol/L (ref 3.5–5.1)
Sodium: 138 mmol/L (ref 136–145)
TOTAL PROTEIN: 6.1 g/dL — AB (ref 6.4–8.3)

## 2017-09-28 LAB — CBC WITH DIFFERENTIAL/PLATELET
BASOS ABS: 0 10*3/uL (ref 0.0–0.1)
Basophils Relative: 1 %
Eosinophils Absolute: 0 10*3/uL (ref 0.0–0.5)
Eosinophils Relative: 0 %
HEMATOCRIT: 26.3 % — AB (ref 34.8–46.6)
Hemoglobin: 8.8 g/dL — ABNORMAL LOW (ref 11.6–15.9)
LYMPHS ABS: 0.6 10*3/uL — AB (ref 0.9–3.3)
LYMPHS PCT: 21 %
MCH: 30.1 pg (ref 25.1–34.0)
MCHC: 33.5 g/dL (ref 31.5–36.0)
MCV: 90.1 fL (ref 79.5–101.0)
MONO ABS: 0.2 10*3/uL (ref 0.1–0.9)
MONOS PCT: 8 %
NEUTROS ABS: 1.8 10*3/uL (ref 1.5–6.5)
Neutrophils Relative %: 70 %
Platelets: 65 10*3/uL — ABNORMAL LOW (ref 145–400)
RBC: 2.92 MIL/uL — ABNORMAL LOW (ref 3.70–5.45)
RDW: 16.7 % — ABNORMAL HIGH (ref 11.2–16.1)
WBC: 2.6 10*3/uL — ABNORMAL LOW (ref 3.9–10.3)

## 2017-09-28 MED ORDER — SODIUM CHLORIDE 0.9 % IV SOLN
INTRAVENOUS | Status: AC
  Administered 2017-09-28: 11:00:00 via INTRAVENOUS

## 2017-09-28 MED ORDER — SODIUM CHLORIDE 0.9% FLUSH
10.0000 mL | INTRAVENOUS | Status: DC | PRN
  Administered 2017-09-28: 10 mL via INTRAVENOUS
  Filled 2017-09-28: qty 10

## 2017-09-28 MED ORDER — METHYLPREDNISOLONE 4 MG PO TBPK
ORAL_TABLET | ORAL | 0 refills | Status: DC
Start: 2017-09-28 — End: 2017-10-05

## 2017-09-28 MED ORDER — HEPARIN SOD (PORK) LOCK FLUSH 100 UNIT/ML IV SOLN
500.0000 [IU] | Freq: Once | INTRAVENOUS | Status: AC | PRN
  Administered 2017-09-28: 500 [IU] via INTRAVENOUS
  Filled 2017-09-28: qty 5

## 2017-09-28 NOTE — Progress Notes (Signed)
[facility]  [phone] [phone]  Clinic Follow up Note   Patient Care Team: Ronita Hipps, MD as PCP - General (Family Medicine) Coralie Keens, MD as Consulting Physician (General Surgery) Richardo Priest, MD as Consulting Physician (Cardiology) 09/28/2017  CHIEF COMPLAINT: Follow up left breast cancer, triple negative; stage IIIB  SUMMARY OF ONCOLOGIC HISTORY: Oncology History   Cancer Staging Malignant neoplasm of upper-inner quadrant of left breast in female, estrogen receptor negative ([place]) Staging form: Breast, AJCC 8th Edition - Clinical stage from 06/10/2017: Stage IIIB (cT2, cN1, cM0, G3, ER: Negative, PR: Negative, HER2: Negative) - Signed by Truitt Merle, MD on 06/21/2017       Malignant neoplasm of upper-inner quadrant of left breast in female, estrogen receptor negative ([place])   06/08/2017 Imaging    DIAGNOSTIC MAMMO and [place] Left Breast 06/08/17 IMPRESSION:  Highly suspicious palpable left breast 11 o'clock mass which measures 2.8 cm in greatest dimension. Ductal extension anteriorly to the level of the nipple, and posteriorly for additional 1.8cm, is suspicious for tumor extension.       06/10/2017 Initial Diagnosis    Malignant neoplasm of upper-inner quadrant of left breast in female, estrogen receptor negative ([place])      06/10/2017 Pathology Results    Diagnosis 06/10/17 1.) Breast, left, needle core biopsy -INVASIVE DUCTAL CARCINOMA, GRADE 3, WITH FOCAL HIGH GRADE DUCTAL CARCINOMA IN SITU -NEOPLASM INVOLVES ALL CORES AND MEASURES APPROXIMATELY 1.5CM IN MAXIMAL LINEAR DIMENSION  2.) Lymph node, needle/core biopsy, left axilla -METASTATIC CARCINOMA CONSISTENT WITH BREAST PRIMARY       06/10/2017 Receptors her2    ER: -, PR-, HER2 not amplified       06/23/2017 Echocardiogram    ECHO 06/23/17 Study Conclusions - Left ventricle: The cavity size was normal. Wall thickness was   increased in a pattern of  moderate LVH. Systolic function was   normal. The estimated ejection fraction was in the range of 60%   to 65%. Wall motion was normal; there were no regional wall   motion abnormalities. Left ventricular diastolic function   parameters were normal. - Mitral valve: Calcified annulus. Mildly thickened leaflets . - Left atrium: The atrium was mildly dilated. - Atrial septum: There was increased thickness of the septum,   consistent with lipomatous hypertrophy. No defect or patent   foramen ovale was identified. - Impressions: Normal GLS - 18.6 Impressions: - Normal GLS - 18.6      06/28/2017 Imaging    CT CAP W Contrast 06/28/17 IMPRESSION: 1. Mildly enlarged left axillary lymph node at 1.5 cm, a significant change from the prior chest CT from 2015, suspicious for left axillary metastatic disease. 2. Left upper breast lesion 2.1 cm in diameter with higher density margins and lower density center, favoring lumpectomy site. 3. Mildly enlarged right hilar lymph node, but stable compared back through 2015. 4. No findings of osseous metastatic disease or additional metastatic sites. 5. Other imaging findings of potential clinical significance: Aberrant right subclavian artery passes behind the esophagus. Aortic Atherosclerosis (ICD10-I70.0) and Emphysema (ICD10-J43.9). Coronary atherosclerosis. Mild nodular enlargement of the left inferior thyroid lobe, isodense to the rest of the thyroid. Airway thickening is present, suggesting bronchitis or reactive airways disease. Left hepatic lobe cyst. Left renal cyst. Likely small cysts in the right kidney. Sigmoid colon diverticulosis.       06/28/2017 Imaging    Bone Scan 06/28/17 IMPRESSION: No evidence of osseous metastatic disease. Subtle increased radiotracer uptake within the left  chest wall, likely within the left breast.      06/30/2017 Surgery    Port placement by Dr. [name] on 06/30/17      07/01/2017 -  Chemotherapy     Neoadjuvant Adriamycin and Cytoxan (AC) every 2 weeks for 4 cycles starting 07/01/17. Dose reduced starting with cycle 2. Compelted AC on 12.19.18. Started weekly Carbo and Taxol every on 08/31/17        07/23/2017 - 07/26/2017 Hospital Admission    Admit date: 07/23/17 Admission diagnosis: 07/26/17 Additional comments: febrile neutropenia; diverticulitis      08/29/2017 Mammogram    Diagnostic Mammogram and [place] 08/29/17  IMPRESSION:  Interval decrease in the size of known left 11 o'clock malignancy, with greatest dimension of 1.3cm.  Interval decrease in size of known left malignant axillary lymph node, with cortical thickness of 3 mm.        09/09/2017 Genetic Testing    The patient had genetic testing due to a personal history of breast cancer and skin cancer,  and a family history of breast cancer, pancreatic cancer, melanoma, and colon cancer.  The Common Hereditary Cancer Panel + Melanoma Panel + Basal Cell Nevus Syndrome Panel was ordered (58 genes).  The following genes were evaluated for sequence changes and exonic deletions/duplications: APC, ATM, AXIN2, BAP1, BARD1, BMPR1A, BRCA1, BRCA2, BRIP1, CDH1, CDK4, CDKN2A (p14ARF), CDKN2A (p16INK4a), CHEK2, CTNNA1, DICER1, EPCAM*, FANCC, GREM1*, KIT, MC1R, MEN1, MLH1, MSH2, MSH3, MSH6, MUTYH, NBN, NF1, PALB2, PALLD, PDGFRA, PMS2, POLD1, POLE, POT1, PTCH1, PTCH2, PTEN, RAD50, RAD51C, RAD51D, RB1, SDHB, SDHC, SDHD, SMAD4, SMARCA4, STK11, SUFU, TERT, TP53, TSC1, TSC2, VHL. The following genes were evaluated for sequence changes only:HOXB13*, MITF*, NTHL1*, SDHA  Results: Negative, no pathogenic variants identified. The date of this test report is 09/09/2017.      CURRENT THERAPY: Neoadjuvant Adriamycin and Cytoxan (AC) every 2 weeks for 4 cycles starting 07/01/17, reduced 10% due to poor toleration starting with cycle 2. Completed on 08/17/17. Start Carbo and taxol weekly for 12 cycles 08/31/17. Taxol changed to abraxane on 1/23 due to suspected  taxol drug rash, carboplatin decreased with cycle 4 for mild thrombocytopenia  INTERVAL HISTORY: Ms. Stenglein presents in the infusion room for cycle 5 chemo abraxane/carboplatin. S/p cycle 4 on 1/23 with reduced carboplatin and change to abraxane. She had 4 days of diarrhea after chemo, used 2 imodium per day without much relief. She has regular BM today. No nausea or vomiting. Rash to arms, chest, and face is unchanged but has new lesions on her legs that itch. Did not improve with topical hydrocortisone. Her BP was low at home SBP 90-100 yesterday with slight lightheadedness on standing. She help her BP med today. Feels that she is eating and drinking well. Denies fever or chills, cough, dyspnea, palpitations, or edema.   REVIEW OF SYSTEMS:   Constitutional: Denies fevers, chills (+) abnormal weight loss  Eyes: Denies blurriness of vision Ears, nose, mouth, throat, and face: Denies mucositis or sore throat (+) blood-streaked nasal drainage Respiratory: Denies cough, dyspnea or wheezes Cardiovascular: Denies palpitation, chest discomfort or lower extremity swelling Gastrointestinal:  Denies nausea, vomiting, constipation, heartburn or change in bowel habits Skin: (+) abnormal skin rashes to arms, face, chest (+) new lesions to legs bilaterally with itching Lymphatics: Denies new lymphadenopathy or easy bruising Neurological:Denies numbness, tingling or new weaknesses (+) lightheadedness on standing at home 1 day ago Behavioral/Psych: Mood is stable, no new changes  All other systems were reviewed with the patient and are negative.  MEDICAL HISTORY:  Past Medical History:  Diagnosis Date  . Breast cancer ([place])   . Cancer [facility])    left breast cancer  . Dysrhythmia    PACs and nonsustained runs of atrial tach by Holter 10/13/16  . Family history of breast cancer   . Family history of colon cancer   . Family history of melanoma   . Family history of pancreatic cancer   . History of kidney  stones   . Hypertension   . Personal history of colonic polyps   . Personal history of skin cancer     SURGICAL HISTORY: Past Surgical History:  Procedure Laterality Date  . COLONOSCOPY    . FOOT SURGERY Right   . IR CV LINE INJECTION  08/02/2017  . left breast lumpectomy  2000  . PORTACATH PLACEMENT Right 06/30/2017   Procedure: [place];  Surgeon: Coralie Keens, MD;  Location: [facility];  Service: General;  Laterality: Right;    I have reviewed the social history and family history with the patient and they are unchanged from previous note.  ALLERGIES:  has No Known Allergies.  MEDICATIONS:  Current Outpatient Medications  Medication Sig Dispense Refill  . acebutolol (SECTRAL) 200 MG capsule Take 200 mg by mouth 2 (two) times daily.    Marland Kitchen aspirin EC 81 MG tablet Take 81 mg by mouth daily.    Marland Kitchen atorvastatin (LIPITOR) 10 MG tablet Take 10 mg by mouth at bedtime.    . calcium carbonate (OS-CAL) 600 MG TABS tablet Take 1,200 mg by mouth daily.    . Cholecalciferol (VITAMIN D3) 2000 units TABS Take 2,000 Units by mouth daily.     . cloNIDine (CATAPRES) 0.1 MG tablet Take 0.1 mg by mouth 2 (two) times daily.    . DOCOSAHEXAENOIC ACID PO Take 1,200 mg by mouth 2 (two) times daily.    Marland Kitchen escitalopram (LEXAPRO) 5 MG tablet Take 5 mg by mouth daily.    . hydrALAZINE (APRESOLINE) 25 MG tablet Take 12.5 mg by mouth 3 (three) times daily.    Marland Kitchen lidocaine-prilocaine (EMLA) cream Apply 1 application topically as needed. 30 g 2  . loratadine (CLARITIN) 10 MG tablet Take 10 mg by mouth daily.    Marland Kitchen LORazepam (ATIVAN) 1 MG tablet Take 1 tablet (1 mg total) by mouth once as needed for anxiety. Take 1 tab 1 hour before MRI scan 2 tablet 0  . magnesium oxide (MAG-OX) 400 MG tablet Take 400 mg by mouth daily.    . methylPREDNISolone (MEDROL DOSEPAK) 4 MG TBPK tablet Take as directed 21 tablet 0  . Multiple Vitamins-Minerals (EYE VITAMINS PO) Take 1 capsule by mouth 2 (two) times  daily.    Marland Kitchen olmesartan-hydrochlorothiazide (BENICAR HCT) 40-25 MG tablet Take 1 tablet by mouth daily.  1  . ondansetron (ZOFRAN) 8 MG tablet Take 1 tablet (8 mg total) by mouth every 8 (eight) hours as needed for nausea or vomiting. (Patient not taking: Reported on 09/21/2017) 20 tablet 2  . prochlorperazine (COMPAZINE) 10 MG tablet Take 1 tablet (10 mg total) by mouth every 6 (six) hours as needed for nausea or vomiting. (Patient not taking: Reported on 09/21/2017) 30 tablet 2  . vitamin B-12 (CYANOCOBALAMIN) 1000 MCG tablet Take 1 tablet by mouth daily.     No current facility-administered medications for this visit.    Facility-Administered Medications Ordered in Other Visits  Medication Dose Route Frequency Provider Last Rate Last Dose  . sodium chloride flush (NS)  0.9 % injection 10 mL  10 mL Intravenous PRN Truitt Merle, MD   10 mL at 07/29/17 1310  . sodium chloride flush (NS) 0.9 % injection 10 mL  10 mL Intravenous PRN Truitt Merle, MD   10 mL at 09/28/17 1232    PHYSICAL EXAMINATION: ECOG PERFORMANCE STATUS: 2 - Symptomatic, <50% confined to bed BP 144/66, HR 70 Temp 98.7 Wt 250.5 lbs  GENERAL:alert, no distress and comfortable SKIN: skin color, texture, turgor are normal, (+) scattered flat erythematous rash to face, arms, chest, and legs bilaterally; no drainage EYES: normal, Conjunctiva are pink and non-injected, sclera clear OROPHARYNX:no exudate, no erythema and lips, buccal mucosa, and tongue normal  NECK: supple, thyroid normal size, non-tender, without nodularity LYMPH:  no palpable cervical, supraclavicular, or axilalry lymphadenopathy LUNGS: clear to auscultation bilaterally with normal breathing effort HEART: regular rate & rhythm and no murmurs and no lower extremity edema ABDOMEN:abdomen soft, non-tender and normal bowel sounds Musculoskeletal:no cyanosis of digits and no clubbing  NEURO: alert & oriented x 3 with fluent speech, no focal motor/sensory deficits BREASTS:  exam deferred, patient examined in infusion area today PAC without erythema  LABORATORY DATA:  I have reviewed the data as listed CBC Latest Ref Rng & Units 09/28/2017 09/21/2017 09/15/2017  WBC 3.9 - 10.3 K/uL 2.6(L) 4.0 3.9  Hemoglobin 11.6 - 15.9 g/dL 8.8(L) 10.3(L) 8.1(L)  Hematocrit 34.8 - 46.6 % 26.3(L) 31.6(L) 25.4(L)  Platelets 145 - 400 K/uL 65(L) 140(L) 211     CMP Latest Ref Rng & Units 09/28/2017 09/21/2017 09/15/2017  Glucose 70 - 140 mg/dL 99 92 94  BUN 7 - 26 mg/dL 46(H) 30(H) 30(H)  Creatinine 0.60 - 1.10 mg/dL 1.30(H) 0.98 1.21(H)  Sodium 136 - 145 mmol/L 138 138 140  Potassium 3.5 - 5.1 mmol/L 4.2 4.1 4.5  Chloride 98 - 109 mmol/L 102 103 103  CO2 22 - 29 mmol/L '27 27 27  ' Calcium 8.4 - 10.4 mg/dL 8.9 9.4 9.0  Total Protein 6.4 - 8.3 g/dL 6.1(L) 6.1(L) 6.1(L)  Total Bilirubin 0.2 - 1.2 mg/dL 0.6 0.6 0.4  Alkaline Phos 40 - 150 U/L 51 49 49  AST 5 - 34 U/L '23 23 22  ' ALT 0 - 55 U/L '20 24 23   ' PATHOLOGY:  Diagnosis 06/10/17 1.) Breast, left, needle core biopsy -INVASIVE DUCTAL CARCINOMA, GRADE 3, WITH FOCAL HIGH GRADE DUCTAL CARCINOMA IN SITU -NEOPLASM INVOLVES ALL CORES AND MEASURES APPROXIMATELY 1.5CM IN MAXIMAL LINEAR DIMENSION -A BREAST PROGNOSTIC PROFILE WILL BE ORDERED ON BLOCK 1A AND SEPARATELY REPORTED   2.) Lymph node, needle/core biopsy, left axilla -METASTATIC CARCINOMA CONSISTENT WITH BREAST PRIMARY    PROCEDURES  ECHO 06/23/17 Study Conclusions - Left ventricle: The cavity size was normal. Wall thickness was increased in a pattern of moderate LVH. Systolic function was normal. The estimated ejection fraction was in the range of 60% to 65%. Wall motion was normal; there were no regional wall motion abnormalities. Left ventricular diastolic function parameters were normal. - Mitral valve: Calcified annulus. Mildly thickened leaflets . - Left atrium: The atrium was mildly dilated. - Atrial septum: There was increased thickness of the  septum, consistent with lipomatous hypertrophy. No defect or patent foramen ovale was identified. - Impressions: Normal GLS - 18.6 Impressions: - Normal GLS - 18.6    RADIOGRAPHIC STUDIES: I have personally reviewed the radiological images as listed and agreed with the findings in the report. No results found.   ASSESSMENT & PLAN: Manya A Hulinis a wonderful  72 y.o.femalewho presents today to discuss the ongoing management of her left breast cancer.   1. 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 2. Afib, HTN 3. Genetics 4. Diverticulitis requiring hospitalization and IV antibiotics 5. Decreased appetite, weight loss 6. Skin rash - face, chest, arms, back, likely related to Taxol 7. Diarrhea  8. Chemotherapy induced anemia   Ms. Countess appears stable today. S/p cycle 4 weekly abraxane/carboplatin 09/21/17. Continues to have skin rash with new lesions appearing on her legs. Taxol was changed to abraxane with cycle 4 last week due to suspected taxol allergy. Not much improved with topical hydrocortisone. Prescribed medrol dose pak today for her to begin. she can continue benadryl for itching PRN. She has moderate thrombocytopenia, plt 65K, despite carboplatin dose reduction to AUC 1.5 with cycle 4. Will hold chemotherapy today. Hgb 8.8, no increased fatigue or dyspnea. She had what sounds like orthostatic hypotension at home yesterday and her creatinine is slightly elevated to 1.3, suspect some degree of dehydration secondary to increased GI output. She will maximize imodium to control diarrhea, was only taking 2 per day previously. Will give 1 L NS over 2 hours today. Return in 1 week for f/u and next cycle.  PLAN: -Labs reviewed, hold chemotherapy today -IVF NS over 2 hours for mild dehydration, diarrhea, elevated creatinine -Medrol dose pak sent to pharmacy,  continue benadryl PRN for itching -Maximize imodium -Return in 1 week for follow up and next cycle  All questions were answered. The patient knows to call the clinic with any problems, questions or concerns. No barriers to learning was detected.    Alla Feeling, NP 09/28/17

## 2017-09-28 NOTE — Patient Instructions (Signed)
Dehydration, Adult Dehydration is a condition in which there is not enough fluid or water in the body. This happens when you lose more fluids than you take in. Important organs, such as the kidneys, brain, and heart, cannot function without a proper amount of fluids. Any loss of fluids from the body can lead to dehydration. Dehydration can range from mild to severe. This condition should be treated right away to prevent it from becoming severe. What are the causes? This condition may be caused by:  Vomiting.  Diarrhea.  Excessive sweating, such as from heat exposure or exercise.  Not drinking enough fluid, especially: ? When ill. ? While doing activity that requires a lot of energy.  Excessive urination.  Fever.  Infection.  Certain medicines, such as medicines that cause the body to lose excess fluid (diuretics).  Inability to access safe drinking water.  Reduced physical ability to get adequate water and food.  What increases the risk? This condition is more likely to develop in people:  Who have a poorly controlled long-term (chronic) illness, such as diabetes, heart disease, or kidney disease.  Who are age 65 or older.  Who are disabled.  Who live in a place with high altitude.  Who play endurance sports.  What are the signs or symptoms? Symptoms of mild dehydration may include:  Thirst.  Dry lips.  Slightly dry mouth.  Dry, warm skin.  Dizziness. Symptoms of moderate dehydration may include:  Very dry mouth.  Muscle cramps.  Dark urine. Urine may be the color of tea.  Decreased urine production.  Decreased tear production.  Heartbeat that is irregular or faster than normal (palpitations).  Headache.  Light-headedness, especially when you stand up from a sitting position.  Fainting (syncope). Symptoms of severe dehydration may include:  Changes in skin, such as: ? Cold and clammy skin. ? Blotchy (mottled) or pale skin. ? Skin that does  not quickly return to normal after being lightly pinched and released (poor skin turgor).  Changes in body fluids, such as: ? Extreme thirst. ? No tear production. ? Inability to sweat when body temperature is high, such as in hot weather. ? Very little urine production.  Changes in vital signs, such as: ? Weak pulse. ? Pulse that is more than 100 beats a minute when sitting still. ? Rapid breathing. ? Low blood pressure.  Other changes, such as: ? Sunken eyes. ? Cold hands and feet. ? Confusion. ? Lack of energy (lethargy). ? Difficulty waking up from sleep. ? Short-term weight loss. ? Unconsciousness. How is this diagnosed? This condition is diagnosed based on your symptoms and a physical exam. Blood and urine tests may be done to help confirm the diagnosis. How is this treated? Treatment for this condition depends on the severity. Mild or moderate dehydration can often be treated at home. Treatment should be started right away. Do not wait until dehydration becomes severe. Severe dehydration is an emergency and it needs to be treated in a hospital. Treatment for mild dehydration may include:  Drinking more fluids.  Replacing salts and minerals in your blood (electrolytes) that you may have lost. Treatment for moderate dehydration may include:  Drinking an oral rehydration solution (ORS). This is a drink that helps you replace fluids and electrolytes (rehydrate). It can be found at pharmacies and retail stores. Treatment for severe dehydration may include:  Receiving fluids through an IV tube.  Receiving an electrolyte solution through a feeding tube that is passed through your nose   and into your stomach (nasogastric tube, or NG tube).  Correcting any abnormalities in electrolytes.  Treating the underlying cause of dehydration. Follow these instructions at home:  If directed by your health care provider, drink an ORS: ? Make an ORS by following instructions on the  package. ? Start by drinking small amounts, about  cup (120 mL) every 5-10 minutes. ? Slowly increase how much you drink until you have taken the amount recommended by your health care provider.  Drink enough clear fluid to keep your urine clear or pale yellow. If you were told to drink an ORS, finish the ORS first, then start slowly drinking other clear fluids. Drink fluids such as: ? Water. Do not drink only water. Doing that can lead to having too little salt (sodium) in the body (hyponatremia). ? Ice chips. ? Fruit juice that you have added water to (diluted fruit juice). ? Low-calorie sports drinks.  Avoid: ? Alcohol. ? Drinks that contain a lot of sugar. These include high-calorie sports drinks, fruit juice that is not diluted, and soda. ? Caffeine. ? Foods that are greasy or contain a lot of fat or sugar.  Take over-the-counter and prescription medicines only as told by your health care provider.  Do not take sodium tablets. This can lead to having too much sodium in the body (hypernatremia).  Eat foods that contain a healthy balance of electrolytes, such as bananas, oranges, potatoes, tomatoes, and spinach.  Keep all follow-up visits as told by your health care provider. This is important. Contact a health care provider if:  You have abdominal pain that: ? Gets worse. ? Stays in one area (localizes).  You have a rash.  You have a stiff neck.  You are more irritable than usual.  You are sleepier or more difficult to wake up than usual.  You feel weak or dizzy.  You feel very thirsty.  You have urinated only a small amount of very dark urine over 6-8 hours. Get help right away if:  You have symptoms of severe dehydration.  You cannot drink fluids without vomiting.  Your symptoms get worse with treatment.  You have a fever.  You have a severe headache.  You have vomiting or diarrhea that: ? Gets worse. ? Does not go away.  You have blood or green matter  (bile) in your vomit.  You have blood in your stool. This may cause stool to look black and tarry.  You have not urinated in 6-8 hours.  You faint.  Your heart rate while sitting still is over 100 beats a minute.  You have trouble breathing. This information is not intended to replace advice given to you by your health care provider. Make sure you discuss any questions you have with your health care provider. Document Released: 08/16/2005 Document Revised: 03/12/2016 Document Reviewed: 10/10/2015 Elsevier Interactive Patient Education  2018 [employer]  

## 2017-10-04 NOTE — Progress Notes (Addendum)
[facility]  [phone] [phone]  Clinic Follow up Note   Patient Care Team: Ronita Hipps, MD as PCP - General (Family Medicine) Coralie Keens, MD as Consulting Physician (General Surgery) Richardo Priest, MD as Consulting Physician (Cardiology) 10/05/2017  CHIEF COMPLAINT: Follow up left breast cancer, triple negative; stage IIIB  SUMMARY OF ONCOLOGIC HISTORY: Oncology History   Cancer Staging Malignant neoplasm of upper-inner quadrant of left breast in female, estrogen receptor negative ([facility]) Staging form: Breast, AJCC 8th Edition - Clinical stage from 06/10/2017: Stage IIIB (cT2, cN1, cM0, G3, ER: Negative, PR: Negative, HER2: Negative) - Signed by Truitt Merle, MD on 06/21/2017       Malignant neoplasm of upper-inner quadrant of left breast in female, estrogen receptor negative ([place])   06/08/2017 Imaging    DIAGNOSTIC MAMMO and [place] Left Breast 06/08/17 IMPRESSION:  Highly suspicious palpable left breast 11 o'clock mass which measures 2.8 cm in greatest dimension. Ductal extension anteriorly to the level of the nipple, and posteriorly for additional 1.8cm, is suspicious for tumor extension.       06/10/2017 Initial Diagnosis    Malignant neoplasm of upper-inner quadrant of left breast in female, estrogen receptor negative ([place])      06/10/2017 Pathology Results    Diagnosis 06/10/17 1.) Breast, left, needle core biopsy -INVASIVE DUCTAL CARCINOMA, GRADE 3, WITH FOCAL HIGH GRADE DUCTAL CARCINOMA IN SITU -NEOPLASM INVOLVES ALL CORES AND MEASURES APPROXIMATELY 1.5CM IN MAXIMAL LINEAR DIMENSION  2.) Lymph node, needle/core biopsy, left axilla -METASTATIC CARCINOMA CONSISTENT WITH BREAST PRIMARY       06/10/2017 Receptors her2    ER: -, PR-, HER2 not amplified       06/23/2017 Echocardiogram    ECHO 06/23/17 Study Conclusions - Left ventricle: The cavity size was normal. Wall thickness was   increased in a pattern of moderate  LVH. Systolic function was   normal. The estimated ejection fraction was in the range of 60%   to 65%. Wall motion was normal; there were no regional wall   motion abnormalities. Left ventricular diastolic function   parameters were normal. - Mitral valve: Calcified annulus. Mildly thickened leaflets . - Left atrium: The atrium was mildly dilated. - Atrial septum: There was increased thickness of the septum,   consistent with lipomatous hypertrophy. No defect or patent   foramen ovale was identified. - Impressions: Normal GLS - 18.6 Impressions: - Normal GLS - 18.6      06/28/2017 Imaging    CT CAP W Contrast 06/28/17 IMPRESSION: 1. Mildly enlarged left axillary lymph node at 1.5 cm, a significant change from the prior chest CT from 2015, suspicious for left axillary metastatic disease. 2. Left upper breast lesion 2.1 cm in diameter with higher density margins and lower density center, favoring lumpectomy site. 3. Mildly enlarged right hilar lymph node, but stable compared back through 2015. 4. No findings of osseous metastatic disease or additional metastatic sites. 5. Other imaging findings of potential clinical significance: Aberrant right subclavian artery passes behind the esophagus. Aortic Atherosclerosis (ICD10-I70.0) and Emphysema (ICD10-J43.9). Coronary atherosclerosis. Mild nodular enlargement of the left inferior thyroid lobe, isodense to the rest of the thyroid. Airway thickening is present, suggesting bronchitis or reactive airways disease. Left hepatic lobe cyst. Left renal cyst. Likely small cysts in the right kidney. Sigmoid colon diverticulosis.       06/28/2017 Imaging    Bone Scan 06/28/17 IMPRESSION: No evidence of osseous metastatic disease. Subtle increased radiotracer uptake within the left  chest wall, likely within the left breast.      06/30/2017 Surgery    Port placement by Dr. [name] on 06/30/17      07/01/2017 -  Chemotherapy     Neoadjuvant Adriamycin and Cytoxan (AC) every 2 weeks for 4 cycles starting 07/01/17. Dose reduced starting with cycle 2. Compelted AC on 12.19.18. Started weekly Carbo and Taxol every on 08/31/17        07/23/2017 - 07/26/2017 Hospital Admission    Admit date: 07/23/17 Admission diagnosis: 07/26/17 Additional comments: febrile neutropenia; diverticulitis      08/29/2017 Mammogram    Diagnostic Mammogram and [place] 08/29/17  IMPRESSION:  Interval decrease in the size of known left 11 o'clock malignancy, with greatest dimension of 1.3cm.  Interval decrease in size of known left malignant axillary lymph node, with cortical thickness of 3 mm.        09/09/2017 Genetic Testing    The patient had genetic testing due to a personal history of breast cancer and skin cancer,  and a family history of breast cancer, pancreatic cancer, melanoma, and colon cancer.  The Common Hereditary Cancer Panel + Melanoma Panel + Basal Cell Nevus Syndrome Panel was ordered (58 genes).  The following genes were evaluated for sequence changes and exonic deletions/duplications: APC, ATM, AXIN2, BAP1, BARD1, BMPR1A, BRCA1, BRCA2, BRIP1, CDH1, CDK4, CDKN2A (p14ARF), CDKN2A (p16INK4a), CHEK2, CTNNA1, DICER1, EPCAM*, FANCC, GREM1*, KIT, MC1R, MEN1, MLH1, MSH2, MSH3, MSH6, MUTYH, NBN, NF1, PALB2, PALLD, PDGFRA, PMS2, POLD1, POLE, POT1, PTCH1, PTCH2, PTEN, RAD50, RAD51C, RAD51D, RB1, SDHB, SDHC, SDHD, SMAD4, SMARCA4, STK11, SUFU, TERT, TP53, TSC1, TSC2, VHL. The following genes were evaluated for sequence changes only:HOXB13*, MITF*, NTHL1*, SDHA  Results: Negative, no pathogenic variants identified. The date of this test report is 09/09/2017.      CURRENT THERAPY: Neoadjuvant Adriamycin and Cytoxan (AC) every 2 weeks for 4 cycles starting 07/01/17, reduced 10% due to poor toleration starting with cycle 2. Completed on 08/17/17. Start Carbo and taxol weekly for 12 cycles 08/31/17. Taxol changed to abraxane on 1/23 due to suspected  taxol drug rash, carboplatin decreased with cycle 4 for mild thrombocytopenia   INTERVAL HISTORY: Ms. Toney returns for follow-up as scheduled prior to next cycle of Abraxane/carboplatin.  Cycle 5 was omitted last week for thrombocytopenia, elevated creatinine, and diarrhea.  She has had a "wonderful" week off.  Eating and drinking well, good appetite.  Energy level has been elevated.  Denies nausea, vomiting, diarrhea.  Her skin rash is improving, took last steroid this morning, itching is resolved.  She has no specific complaints today.  REVIEW OF SYSTEMS:   Constitutional: Denies fatigue, fevers, chills or abnormal weight loss (+) good appetite  Eyes: Denies blurriness of vision Ears, nose, mouth, throat, and face: Denies mucositis or sore throat Respiratory: Denies cough, dyspnea or wheezes Cardiovascular: Denies palpitation, chest discomfort or lower extremity swelling Gastrointestinal:  Denies nausea, vomiting, constipation, diarrhea, heartburn or change in bowel habits Skin: (+) abnormal skin rash, improving (+) denies itching Lymphatics: Denies new lymphadenopathy or easy bruising Neurological:Denies numbness, tingling or new weaknesses Behavioral/Psych: Mood is stable, no new changes  All other systems were reviewed with the patient and are negative.  MEDICAL HISTORY:  Past Medical History:  Diagnosis Date  . Breast cancer ([place])   . Cancer [facility])    left breast cancer  . Dysrhythmia    PACs and nonsustained runs of atrial tach by Holter 10/13/16  . Family history of breast cancer   .  Family history of colon cancer   . Family history of melanoma   . Family history of pancreatic cancer   . History of kidney stones   . Hypertension   . Personal history of colonic polyps   . Personal history of skin cancer     SURGICAL HISTORY: Past Surgical History:  Procedure Laterality Date  . COLONOSCOPY    . FOOT SURGERY Right   . IR CV LINE INJECTION  08/02/2017  . left breast  lumpectomy  2000  . PORTACATH PLACEMENT Right 06/30/2017   Procedure: [place];  Surgeon: Coralie Keens, MD;  Location: [facility];  Service: General;  Laterality: Right;    I have reviewed the social history and family history with the patient and they are unchanged from previous note.  ALLERGIES:  has No Known Allergies.  MEDICATIONS:  Current Outpatient Medications  Medication Sig Dispense Refill  . acebutolol (SECTRAL) 200 MG capsule Take 200 mg by mouth 2 (two) times daily.    Marland Kitchen aspirin EC 81 MG tablet Take 81 mg by mouth daily.    Marland Kitchen atorvastatin (LIPITOR) 10 MG tablet Take 10 mg by mouth at bedtime.    . calcium carbonate (OS-CAL) 600 MG TABS tablet Take 1,200 mg by mouth daily.    . Cholecalciferol (VITAMIN D3) 2000 units TABS Take 2,000 Units by mouth daily.     . cloNIDine (CATAPRES) 0.1 MG tablet Take 0.1 mg by mouth 2 (two) times daily.    . DOCOSAHEXAENOIC ACID PO Take 1,200 mg by mouth 2 (two) times daily.    Marland Kitchen escitalopram (LEXAPRO) 5 MG tablet Take 5 mg by mouth daily.    . hydrALAZINE (APRESOLINE) 25 MG tablet Take 12.5 mg by mouth 3 (three) times daily.    Marland Kitchen lidocaine-prilocaine (EMLA) cream Apply 1 application topically as needed. 30 g 2  . loratadine (CLARITIN) 10 MG tablet Take 10 mg by mouth daily.    Marland Kitchen LORazepam (ATIVAN) 1 MG tablet Take 1 tablet (1 mg total) by mouth once as needed for anxiety. Take 1 tab 1 hour before MRI scan 2 tablet 0  . magnesium oxide (MAG-OX) 400 MG tablet Take 400 mg by mouth daily.    . Multiple Vitamins-Minerals (EYE VITAMINS PO) Take 1 capsule by mouth 2 (two) times daily.    Marland Kitchen olmesartan-hydrochlorothiazide (BENICAR HCT) 40-25 MG tablet Take 1 tablet by mouth daily.  1  . ondansetron (ZOFRAN) 8 MG tablet Take 1 tablet (8 mg total) by mouth every 8 (eight) hours as needed for nausea or vomiting. 20 tablet 2  . prochlorperazine (COMPAZINE) 10 MG tablet Take 1 tablet (10 mg total) by mouth every 6 (six) hours as  needed for nausea or vomiting. 30 tablet 2  . vitamin B-12 (CYANOCOBALAMIN) 1000 MCG tablet Take 1 tablet by mouth daily.     No current facility-administered medications for this visit.    Facility-Administered Medications Ordered in Other Visits  Medication Dose Route Frequency Provider Last Rate Last Dose  . sodium chloride flush (NS) 0.9 % injection 10 mL  10 mL Intravenous PRN Truitt Merle, MD   10 mL at 07/29/17 1310    PHYSICAL EXAMINATION: ECOG PERFORMANCE STATUS: 1 - Symptomatic but completely ambulatory  Vitals:   10/05/17 1016  BP: (!) 142/54  Pulse: (!) 111  Resp: 20  Temp: 97.9 F (36.6 C)  SpO2: 100%   Filed Weights   10/05/17 1016  Weight: 250 lb (113.4 kg)    GENERAL:alert,  no distress and comfortable SKIN: skin color, texture, turgor are normal, no significant lesions (+) scattered flat rash is improving to face, arms, chest, and legs; erythema is faint; no new lesions EYES: normal, Conjunctiva are pink and non-injected, sclera clear OROPHARYNX:no exudate, no erythema and lips, buccal mucosa, and tongue normal  NECK: supple, thyroid normal size, non-tender, without nodularity LYMPH:  no palpable cervical, supraclavicular, or axillary, lymphadenopathy LUNGS: clear to auscultation bilaterally with normal breathing effort HEART: regular rate & rhythm and no murmurs and no lower extremity edema ABDOMEN:abdomen soft, non-tender and normal bowel sounds Musculoskeletal:no cyanosis of digits and no clubbing  NEURO: alert & oriented x 3 with fluent speech, no focal motor/sensory deficits BREASTS: No palpable mass in either breast or axilla. (+) left nipple inversion, as noted previously PAC without erythema  LABORATORY DATA:  I have reviewed the data as listed CBC Latest Ref Rng & Units 10/05/2017 09/28/2017 09/21/2017  WBC 3.9 - 10.3 K/uL 3.3(L) 2.6(L) 4.0  Hemoglobin 11.6 - 15.9 g/dL 9.3(L) 8.8(L) 10.3(L)  Hematocrit 34.8 - 46.6 % 28.0(L) 26.3(L) 31.6(L)  Platelets  145 - 400 K/uL 109(L) 65(L) 140(L)     CMP Latest Ref Rng & Units 10/05/2017 09/28/2017 09/21/2017  Glucose 70 - 140 mg/dL 93 99 92  BUN 7 - 26 mg/dL 31(H) 46(H) 30(H)  Creatinine 0.60 - 1.10 mg/dL 0.94 1.30(H) 0.98  Sodium 136 - 145 mmol/L 138 138 138  Potassium 3.5 - 5.1 mmol/L 3.9 4.2 4.1  Chloride 98 - 109 mmol/L 104 102 103  CO2 22 - 29 mmol/L '26 27 27  ' Calcium 8.4 - 10.4 mg/dL 9.0 8.9 9.4  Total Protein 6.4 - 8.3 g/dL 6.0(L) 6.1(L) 6.1(L)  Total Bilirubin 0.2 - 1.2 mg/dL 0.6 0.6 0.6  Alkaline Phos 40 - 150 U/L 54 51 49  AST 5 - 34 U/L '19 23 23  ' ALT 0 - 55 U/L '19 20 24   ' PATHOLOGY:  Diagnosis 06/10/17 1.) Breast, left, needle core biopsy -INVASIVE DUCTAL CARCINOMA, GRADE 3, WITH FOCAL HIGH GRADE DUCTAL CARCINOMA IN SITU -NEOPLASM INVOLVES ALL CORES AND MEASURES APPROXIMATELY 1.5CM IN MAXIMAL LINEAR DIMENSION -A BREAST PROGNOSTIC PROFILE WILL BE ORDERED ON BLOCK 1A AND SEPARATELY REPORTED   2.) Lymph node, needle/core biopsy, left axilla -METASTATIC CARCINOMA CONSISTENT WITH BREAST PRIMARY    PROCEDURES  ECHO 06/23/17 Study Conclusions - Left ventricle: The cavity size was normal. Wall thickness was increased in a pattern of moderate LVH. Systolic function was normal. The estimated ejection fraction was in the range of 60% to 65%. Wall motion was normal; there were no regional wall motion abnormalities. Left ventricular diastolic function parameters were normal. - Mitral valve: Calcified annulus. Mildly thickened leaflets . - Left atrium: The atrium was mildly dilated. - Atrial septum: There was increased thickness of the septum, consistent with lipomatous hypertrophy. No defect or patent foramen ovale was identified. - Impressions: Normal GLS - 18.6 Impressions: - Normal GLS - 18.6    RADIOGRAPHIC STUDIES: I have personally reviewed the radiological images as listed and agreed with the findings in the report. No results found.   ASSESSMENT  & PLAN: Hopie Pellegrin Hulinis a wonderful 72 y.o.femalewho presents today to discuss the ongoing management of her left breast cancer.   1. 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 -Ms. Klomp appears stable today; she has completed 4 cycles neoadjuvant AC and 5 cycles of carbo/taxol which was  changed to abraxane with cycle 4 due to suspected taxol allergy skin rash; cycle 5 was omitted for thrombocytopenia, dehydration, and diarrhea -she has recovered well, ready to proceed with cycle 6 carbo/abraxane today -Labs reviewed, thrombocytopenia improved, plt 109K; carbo reduced with cycle 4; will further reduce carboplatin today, order changed per Dr. Benay Spice; pharmacy notified; anemia and leukopenia are stable; Cr improved to normal; adequate for treatment -She had interim [place] and mammogram on 08/29/17 since her breast mass was not palpable on exam; showed significant reduction of the breast mass and decreased adenopathy in left axilla; good overall response to therapy -she will continue weekly abraxane/carboplatin; we reviewed the risk for further thrombocytopenia and possibly need to decrease or omit carboplatin in the future. She understands.  -return in 2 weeks for f/u and weekly chemo  2. Decreased appetite, weight loss -Her appetite has been good this week off treatment, though she lost 2 more lbs -I encouraged her to eat well and try to maintain her weight  3. Skin rash - face, chest, arms, back, likely related to Taxol -She previously had skin rash after antibiotics given for diverticulitis 06/2017, rash resolved when medication was stopped.  -she developed new rash to face, chest, arms, and legs while on taxol/carbo; suspected taxol allergy -Taxol was changed to abraxane with cycle 4 -She completed medrol dose pack today, 2/6 -itching is resolved, rash is improving overall but  not resolved -will monitor closely   4. Diarrhea  -She denies diarrhea this past week while off chemo; I encouraged her to increase po liquids intake if it returns and use imodium and lomotil to maximum doses  5. Chemotherapy induced anemia, thrombocytopenia  -She has had symptomatic anemia requiring blood transfusion on chemotherapy -Anemia is stable Hgb 9.3 today; she does not require transfusion at this time -Cycle 5 was omitted for platelet count 60K, carboplatin had been previously dose reduced; plt recovered to 109K today, no active bleeding. Will proceed with further dose reduction and monitor closely.   PLAN: -Labs reviewed, continue weekly carboplatin/abraxane, with further carboplatin dose reduction per Dr. Benay Spice -Return in 1 week for next cycle  -F/u in 2 weeks   All questions were answered. The patient knows to call the clinic with any problems, questions or concerns. No barriers to learning was detected.    Alla Feeling, NP 10/05/17  This was a shared visit with Cira Rue.  Ms. Crotteau was interviewed and examined.  The carboplatin will be further dose reduced secondary to thrombocytopenia.The rash appears to be resolving.  Julieanne Manson, MD

## 2017-10-05 ENCOUNTER — Inpatient Hospital Stay: Payer: Medicare Other

## 2017-10-05 ENCOUNTER — Inpatient Hospital Stay: Payer: Medicare Other | Attending: Hematology | Admitting: Nurse Practitioner

## 2017-10-05 ENCOUNTER — Encounter: Payer: Self-pay | Admitting: Nurse Practitioner

## 2017-10-05 VITALS — BP 142/54 | HR 111 | Temp 97.9°F | Resp 20 | Ht 67.0 in | Wt 250.0 lb

## 2017-10-05 DIAGNOSIS — C50212 Malignant neoplasm of upper-inner quadrant of left female breast: Secondary | ICD-10-CM

## 2017-10-05 DIAGNOSIS — Z171 Estrogen receptor negative status [ER-]: Principal | ICD-10-CM

## 2017-10-05 DIAGNOSIS — R634 Abnormal weight loss: Secondary | ICD-10-CM | POA: Diagnosis not present

## 2017-10-05 DIAGNOSIS — C773 Secondary and unspecified malignant neoplasm of axilla and upper limb lymph nodes: Secondary | ICD-10-CM | POA: Insufficient documentation

## 2017-10-05 DIAGNOSIS — Z95828 Presence of other vascular implants and grafts: Secondary | ICD-10-CM

## 2017-10-05 DIAGNOSIS — D696 Thrombocytopenia, unspecified: Secondary | ICD-10-CM

## 2017-10-05 DIAGNOSIS — R630 Anorexia: Secondary | ICD-10-CM | POA: Diagnosis not present

## 2017-10-05 DIAGNOSIS — Z5189 Encounter for other specified aftercare: Secondary | ICD-10-CM | POA: Diagnosis not present

## 2017-10-05 DIAGNOSIS — D6959 Other secondary thrombocytopenia: Secondary | ICD-10-CM

## 2017-10-05 DIAGNOSIS — L270 Generalized skin eruption due to drugs and medicaments taken internally: Secondary | ICD-10-CM

## 2017-10-05 DIAGNOSIS — R21 Rash and other nonspecific skin eruption: Secondary | ICD-10-CM | POA: Diagnosis not present

## 2017-10-05 DIAGNOSIS — T451X5A Adverse effect of antineoplastic and immunosuppressive drugs, initial encounter: Secondary | ICD-10-CM | POA: Insufficient documentation

## 2017-10-05 DIAGNOSIS — Z5111 Encounter for antineoplastic chemotherapy: Secondary | ICD-10-CM | POA: Insufficient documentation

## 2017-10-05 DIAGNOSIS — D6481 Anemia due to antineoplastic chemotherapy: Secondary | ICD-10-CM | POA: Diagnosis not present

## 2017-10-05 LAB — COMPREHENSIVE METABOLIC PANEL
ALT: 19 U/L (ref 0–55)
AST: 19 U/L (ref 5–34)
Albumin: 3.7 g/dL (ref 3.5–5.0)
Alkaline Phosphatase: 54 U/L (ref 40–150)
Anion gap: 8 (ref 3–11)
BILIRUBIN TOTAL: 0.6 mg/dL (ref 0.2–1.2)
BUN: 31 mg/dL — AB (ref 7–26)
CO2: 26 mmol/L (ref 22–29)
Calcium: 9 mg/dL (ref 8.4–10.4)
Chloride: 104 mmol/L (ref 98–109)
Creatinine, Ser: 0.94 mg/dL (ref 0.60–1.10)
GFR calc Af Amer: >60 mL/min (ref >60)
GFR calc non Af Amer: 58 mL/min — ABNORMAL LOW (ref >60)
GLUCOSE: 93 mg/dL (ref 70–140)
POTASSIUM: 3.9 mmol/L (ref 3.5–5.1)
Sodium: 138 mmol/L (ref 136–145)
TOTAL PROTEIN: 6 g/dL — AB (ref 6.4–8.3)

## 2017-10-05 LAB — CBC WITH DIFFERENTIAL/PLATELET
BASOS ABS: 0 10*3/uL (ref 0.0–0.1)
Basophils Relative: 1 %
EOS PCT: 0 %
Eosinophils Absolute: 0 10*3/uL (ref 0.0–0.5)
HCT: 28 % — ABNORMAL LOW (ref 34.8–46.6)
Hemoglobin: 9.3 g/dL — ABNORMAL LOW (ref 11.6–15.9)
LYMPHS ABS: 0.7 10*3/uL — AB (ref 0.9–3.3)
LYMPHS PCT: 20 %
MCH: 31.4 pg (ref 25.1–34.0)
MCHC: 33.3 g/dL (ref 31.5–36.0)
MCV: 94.3 fL (ref 79.5–101.0)
MONO ABS: 0.7 10*3/uL (ref 0.1–0.9)
Monocytes Relative: 20 %
Neutro Abs: 2 10*3/uL (ref 1.5–6.5)
Neutrophils Relative %: 59 %
PLATELETS: 109 10*3/uL — AB (ref 145–400)
RBC: 2.97 MIL/uL — ABNORMAL LOW (ref 3.70–5.45)
RDW: 21.2 % — AB (ref 11.2–14.5)
WBC: 3.3 10*3/uL — ABNORMAL LOW (ref 3.9–10.3)

## 2017-10-05 MED ORDER — SODIUM CHLORIDE 0.9 % IV SOLN
Freq: Once | INTRAVENOUS | Status: AC
  Administered 2017-10-05: 12:00:00 via INTRAVENOUS

## 2017-10-05 MED ORDER — SODIUM CHLORIDE 0.9 % IV SOLN
140.0000 mg | Freq: Once | INTRAVENOUS | Status: AC
  Administered 2017-10-05: 140 mg via INTRAVENOUS
  Filled 2017-10-05: qty 14

## 2017-10-05 MED ORDER — PACLITAXEL PROTEIN-BOUND CHEMO INJECTION 100 MG
80.0000 mg/m2 | Freq: Once | INTRAVENOUS | Status: AC
  Administered 2017-10-05: 200 mg via INTRAVENOUS
  Filled 2017-10-05: qty 40

## 2017-10-05 MED ORDER — DEXAMETHASONE SODIUM PHOSPHATE 10 MG/ML IJ SOLN
INTRAMUSCULAR | Status: AC
Start: 2017-10-05 — End: 2017-10-05
  Filled 2017-10-05: qty 1

## 2017-10-05 MED ORDER — SODIUM CHLORIDE 0.9% FLUSH
10.0000 mL | INTRAVENOUS | Status: DC | PRN
  Administered 2017-10-05: 10 mL via INTRAVENOUS
  Filled 2017-10-05: qty 10

## 2017-10-05 MED ORDER — DEXAMETHASONE SODIUM PHOSPHATE 10 MG/ML IJ SOLN
10.0000 mg | Freq: Once | INTRAMUSCULAR | Status: AC
  Administered 2017-10-05: 10 mg via INTRAVENOUS

## 2017-10-05 MED ORDER — PALONOSETRON HCL INJECTION 0.25 MG/5ML
INTRAVENOUS | Status: AC
Start: 2017-10-05 — End: 2017-10-05
  Filled 2017-10-05: qty 5

## 2017-10-05 MED ORDER — PALONOSETRON HCL INJECTION 0.25 MG/5ML
0.2500 mg | Freq: Once | INTRAVENOUS | Status: AC
  Administered 2017-10-05: 0.25 mg via INTRAVENOUS

## 2017-10-05 NOTE — Patient Instructions (Signed)
[facility] Discharge Instructions for Patients Receiving Chemotherapy  Today you received the following chemotherapy agents Carboplatin and Abraxane.   To help prevent nausea and vomiting after your treatment, we encourage you to take your nausea medication as directed.   If you develop nausea and vomiting that is not controlled by your nausea medication, call the clinic.   BELOW ARE SYMPTOMS THAT SHOULD BE REPORTED IMMEDIATELY:  *FEVER GREATER THAN 100.5 F  *CHILLS WITH OR WITHOUT FEVER  NAUSEA AND VOMITING THAT IS NOT CONTROLLED WITH YOUR NAUSEA MEDICATION  *UNUSUAL SHORTNESS OF BREATH  *UNUSUAL BRUISING OR BLEEDING  TENDERNESS IN MOUTH AND THROAT WITH OR WITHOUT PRESENCE OF ULCERS  *URINARY PROBLEMS  *BOWEL PROBLEMS  UNUSUAL RASH Items with * indicate a potential emergency and should be followed up as soon as possible.  Feel free to call the clinic should you have any questions or concerns. The clinic phone number is [phone].  Please show the [place] at check-in to the Emergency Department and triage nurse.

## 2017-10-05 NOTE — Patient Instructions (Signed)
Implanted Port Home Guide An implanted port is a type of central line that is placed under the skin. Central lines are used to provide IV access when treatment or nutrition needs to be given through a person's veins. Implanted ports are used for long-term IV access. An implanted port may be placed because:  You need IV medicine that would be irritating to the small veins in your hands or arms.  You need long-term IV medicines, such as antibiotics.  You need IV nutrition for a long period.  You need frequent blood draws for lab tests.  You need dialysis.  Implanted ports are usually placed in the chest area, but they can also be placed in the upper arm, the abdomen, or the leg. An implanted port has two main parts:  Reservoir. The reservoir is round and will appear as a small, raised area under your skin. The reservoir is the part where a needle is inserted to give medicines or draw blood.  Catheter. The catheter is a thin, flexible tube that extends from the reservoir. The catheter is placed into a large vein. Medicine that is inserted into the reservoir goes into the catheter and then into the vein.  How will I care for my incision site? Do not get the incision site wet. Bathe or shower as directed by your health care provider. How is my port accessed? Special steps must be taken to access the port:  Before the port is accessed, a numbing cream can be placed on the skin. This helps numb the skin over the port site.  Your health care provider uses a sterile technique to access the port. ? Your health care provider must put on a mask and sterile gloves. ? The skin over your port is cleaned carefully with an antiseptic and allowed to dry. ? The port is gently pinched between sterile gloves, and a needle is inserted into the port.  Only "non-coring" port needles should be used to access the port. Once the port is accessed, a blood return should be checked. This helps ensure that the port  is in the vein and is not clogged.  If your port needs to remain accessed for a constant infusion, a clear (transparent) bandage will be placed over the needle site. The bandage and needle will need to be changed every week, or as directed by your health care provider.  Keep the bandage covering the needle clean and dry. Do not get it wet. Follow your health care provider's instructions on how to take a shower or bath while the port is accessed.  If your port does not need to stay accessed, no bandage is needed over the port.  What is flushing? Flushing helps keep the port from getting clogged. Follow your health care provider's instructions on how and when to flush the port. Ports are usually flushed with saline solution or a medicine called heparin. The need for flushing will depend on how the port is used.  If the port is used for intermittent medicines or blood draws, the port will need to be flushed: ? After medicines have been given. ? After blood has been drawn. ? As part of routine maintenance.  If a constant infusion is running, the port may not need to be flushed.  How long will my port stay implanted? The port can stay in for as long as your health care provider thinks it is needed. When it is time for the port to come out, surgery will be   done to remove it. The procedure is similar to the one performed when the port was put in. When should I seek immediate medical care? When you have an implanted port, you should seek immediate medical care if:  You notice a bad smell coming from the incision site.  You have swelling, redness, or drainage at the incision site.  You have more swelling or pain at the port site or the surrounding area.  You have a fever that is not controlled with medicine.  This information is not intended to replace advice given to you by your health care provider. Make sure you discuss any questions you have with your health care provider. Document  Released: 08/16/2005 Document Revised: 01/22/2016 Document Reviewed: 04/23/2013 Elsevier Interactive Patient Education  2017 [employer]  

## 2017-10-12 ENCOUNTER — Inpatient Hospital Stay: Payer: Medicare Other

## 2017-10-12 ENCOUNTER — Other Ambulatory Visit: Payer: Medicare Other

## 2017-10-12 ENCOUNTER — Other Ambulatory Visit: Payer: Self-pay | Admitting: Hematology

## 2017-10-12 ENCOUNTER — Ambulatory Visit: Payer: Medicare Other

## 2017-10-12 VITALS — BP 125/50 | HR 62 | Temp 98.0°F

## 2017-10-12 DIAGNOSIS — Z5111 Encounter for antineoplastic chemotherapy: Secondary | ICD-10-CM | POA: Diagnosis not present

## 2017-10-12 DIAGNOSIS — Z171 Estrogen receptor negative status [ER-]: Secondary | ICD-10-CM

## 2017-10-12 DIAGNOSIS — C50212 Malignant neoplasm of upper-inner quadrant of left female breast: Secondary | ICD-10-CM

## 2017-10-12 DIAGNOSIS — T451X5A Adverse effect of antineoplastic and immunosuppressive drugs, initial encounter: Secondary | ICD-10-CM | POA: Diagnosis not present

## 2017-10-12 DIAGNOSIS — D6481 Anemia due to antineoplastic chemotherapy: Secondary | ICD-10-CM | POA: Diagnosis not present

## 2017-10-12 DIAGNOSIS — C773 Secondary and unspecified malignant neoplasm of axilla and upper limb lymph nodes: Secondary | ICD-10-CM | POA: Diagnosis not present

## 2017-10-12 DIAGNOSIS — Z5189 Encounter for other specified aftercare: Secondary | ICD-10-CM | POA: Diagnosis not present

## 2017-10-12 DIAGNOSIS — Z95828 Presence of other vascular implants and grafts: Secondary | ICD-10-CM

## 2017-10-12 LAB — COMPREHENSIVE METABOLIC PANEL WITH GFR
ALT: 20 U/L (ref 0–55)
AST: 24 U/L (ref 5–34)
Albumin: 3.5 g/dL (ref 3.5–5.0)
Alkaline Phosphatase: 50 U/L (ref 40–150)
Anion gap: 10 (ref 3–11)
BUN: 30 mg/dL — ABNORMAL HIGH (ref 7–26)
CO2: 26 mmol/L (ref 22–29)
Calcium: 9.1 mg/dL (ref 8.4–10.4)
Chloride: 102 mmol/L (ref 98–109)
Creatinine, Ser: 1.28 mg/dL — ABNORMAL HIGH (ref 0.60–1.10)
GFR calc Af Amer: 47 mL/min — ABNORMAL LOW
GFR calc non Af Amer: 40 mL/min — ABNORMAL LOW
Glucose, Bld: 99 mg/dL (ref 70–140)
Potassium: 4.2 mmol/L (ref 3.5–5.1)
Sodium: 138 mmol/L (ref 136–145)
Total Bilirubin: 0.8 mg/dL (ref 0.2–1.2)
Total Protein: 5.8 g/dL — ABNORMAL LOW (ref 6.4–8.3)

## 2017-10-12 LAB — CBC WITH DIFFERENTIAL/PLATELET
Basophils Absolute: 0 K/uL (ref 0.0–0.1)
Basophils Relative: 0 %
Eosinophils Absolute: 0 K/uL (ref 0.0–0.5)
Eosinophils Relative: 1 %
HCT: 25.5 % — ABNORMAL LOW (ref 34.8–46.6)
Hemoglobin: 8.3 g/dL — ABNORMAL LOW (ref 11.6–15.9)
Lymphocytes Relative: 22 %
Lymphs Abs: 0.8 K/uL — ABNORMAL LOW (ref 0.9–3.3)
MCH: 31.4 pg (ref 25.1–34.0)
MCHC: 32.5 g/dL (ref 31.5–36.0)
MCV: 96.6 fL (ref 79.5–101.0)
Monocytes Absolute: 0.3 K/uL (ref 0.1–0.9)
Monocytes Relative: 9 %
Neutro Abs: 2.4 K/uL (ref 1.5–6.5)
Neutrophils Relative %: 68 %
Platelets: 140 K/uL — ABNORMAL LOW (ref 145–400)
RBC: 2.64 MIL/uL — ABNORMAL LOW (ref 3.70–5.45)
RDW: 19 % — ABNORMAL HIGH (ref 11.2–14.5)
WBC: 3.5 K/uL — ABNORMAL LOW (ref 3.9–10.3)

## 2017-10-12 MED ORDER — SODIUM CHLORIDE 0.9% FLUSH
10.0000 mL | INTRAVENOUS | Status: DC | PRN
  Administered 2017-10-12: 10 mL via INTRAVENOUS
  Filled 2017-10-12: qty 10

## 2017-10-12 MED ORDER — DEXAMETHASONE SODIUM PHOSPHATE 10 MG/ML IJ SOLN
INTRAMUSCULAR | Status: AC
  Filled 2017-10-12: qty 1

## 2017-10-12 MED ORDER — HEPARIN SOD (PORK) LOCK FLUSH 100 UNIT/ML IV SOLN
500.0000 [IU] | Freq: Once | INTRAVENOUS | Status: AC | PRN
  Administered 2017-10-12: 500 [IU]
  Filled 2017-10-12: qty 5

## 2017-10-12 MED ORDER — SODIUM CHLORIDE 0.9% FLUSH
10.0000 mL | INTRAVENOUS | Status: DC | PRN
  Administered 2017-10-12: 10 mL
  Filled 2017-10-12: qty 10

## 2017-10-12 MED ORDER — SODIUM CHLORIDE 0.9 % IV SOLN
150.0000 mg | Freq: Once | INTRAVENOUS | Status: AC
  Administered 2017-10-12: 150 mg via INTRAVENOUS
  Filled 2017-10-12: qty 15

## 2017-10-12 MED ORDER — DEXAMETHASONE SODIUM PHOSPHATE 10 MG/ML IJ SOLN
10.0000 mg | Freq: Once | INTRAMUSCULAR | Status: AC
  Administered 2017-10-12: 10 mg via INTRAVENOUS

## 2017-10-12 MED ORDER — PALONOSETRON HCL INJECTION 0.25 MG/5ML
0.2500 mg | Freq: Once | INTRAVENOUS | Status: AC
  Administered 2017-10-12: 0.25 mg via INTRAVENOUS

## 2017-10-12 MED ORDER — PACLITAXEL PROTEIN-BOUND CHEMO INJECTION 100 MG
80.0000 mg/m2 | Freq: Once | INTRAVENOUS | Status: AC
  Administered 2017-10-12: 200 mg via INTRAVENOUS
  Filled 2017-10-12: qty 40

## 2017-10-12 MED ORDER — PALONOSETRON HCL INJECTION 0.25 MG/5ML
INTRAVENOUS | Status: AC
  Filled 2017-10-12: qty 5

## 2017-10-12 MED ORDER — SODIUM CHLORIDE 0.9 % IV SOLN
Freq: Once | INTRAVENOUS | Status: AC
  Administered 2017-10-12: 09:00:00 via INTRAVENOUS

## 2017-10-12 NOTE — Patient Instructions (Signed)
Implanted Port Home Guide An implanted port is a type of central line that is placed under the skin. Central lines are used to provide IV access when treatment or nutrition needs to be given through a person's veins. Implanted ports are used for long-term IV access. An implanted port may be placed because:  You need IV medicine that would be irritating to the small veins in your hands or arms.  You need long-term IV medicines, such as antibiotics.  You need IV nutrition for a long period.  You need frequent blood draws for lab tests.  You need dialysis.  Implanted ports are usually placed in the chest area, but they can also be placed in the upper arm, the abdomen, or the leg. An implanted port has two main parts:  Reservoir. The reservoir is round and will appear as a small, raised area under your skin. The reservoir is the part where a needle is inserted to give medicines or draw blood.  Catheter. The catheter is a thin, flexible tube that extends from the reservoir. The catheter is placed into a large vein. Medicine that is inserted into the reservoir goes into the catheter and then into the vein.  How will I care for my incision site? Do not get the incision site wet. Bathe or shower as directed by your health care provider. How is my port accessed? Special steps must be taken to access the port:  Before the port is accessed, a numbing cream can be placed on the skin. This helps numb the skin over the port site.  Your health care provider uses a sterile technique to access the port. ? Your health care provider must put on a mask and sterile gloves. ? The skin over your port is cleaned carefully with an antiseptic and allowed to dry. ? The port is gently pinched between sterile gloves, and a needle is inserted into the port.  Only "non-coring" port needles should be used to access the port. Once the port is accessed, a blood return should be checked. This helps ensure that the port  is in the vein and is not clogged.  If your port needs to remain accessed for a constant infusion, a clear (transparent) bandage will be placed over the needle site. The bandage and needle will need to be changed every week, or as directed by your health care provider.  Keep the bandage covering the needle clean and dry. Do not get it wet. Follow your health care provider's instructions on how to take a shower or bath while the port is accessed.  If your port does not need to stay accessed, no bandage is needed over the port.  What is flushing? Flushing helps keep the port from getting clogged. Follow your health care provider's instructions on how and when to flush the port. Ports are usually flushed with saline solution or a medicine called heparin. The need for flushing will depend on how the port is used.  If the port is used for intermittent medicines or blood draws, the port will need to be flushed: ? After medicines have been given. ? After blood has been drawn. ? As part of routine maintenance.  If a constant infusion is running, the port may not need to be flushed.  How long will my port stay implanted? The port can stay in for as long as your health care provider thinks it is needed. When it is time for the port to come out, surgery will be   done to remove it. The procedure is similar to the one performed when the port was put in. When should I seek immediate medical care? When you have an implanted port, you should seek immediate medical care if:  You notice a bad smell coming from the incision site.  You have swelling, redness, or drainage at the incision site.  You have more swelling or pain at the port site or the surrounding area.  You have a fever that is not controlled with medicine.  This information is not intended to replace advice given to you by your health care provider. Make sure you discuss any questions you have with your health care provider. Document  Released: 08/16/2005 Document Revised: 01/22/2016 Document Reviewed: 04/23/2013 Elsevier Interactive Patient Education  2017 [employer]  

## 2017-10-12 NOTE — Patient Instructions (Signed)
[facility] Discharge Instructions for Patients Receiving Chemotherapy  Today you received the following chemotherapy agents Abraxane and carboplatin  To help prevent nausea and vomiting after your treatment, we encourage you to take your nausea medication as directed  If you develop nausea and vomiting that is not controlled by your nausea medication, call the clinic.   BELOW ARE SYMPTOMS THAT SHOULD BE REPORTED IMMEDIATELY:  *FEVER GREATER THAN 100.5 F  *CHILLS WITH OR WITHOUT FEVER  NAUSEA AND VOMITING THAT IS NOT CONTROLLED WITH YOUR NAUSEA MEDICATION  *UNUSUAL SHORTNESS OF BREATH  *UNUSUAL BRUISING OR BLEEDING  TENDERNESS IN MOUTH AND THROAT WITH OR WITHOUT PRESENCE OF ULCERS  *URINARY PROBLEMS  *BOWEL PROBLEMS  UNUSUAL RASH Items with * indicate a potential emergency and should be followed up as soon as possible.  Feel free to call the clinic should you have any questions or concerns. The clinic phone number is [phone].  Please show the [place] at check-in to the Emergency Department and triage nurse.

## 2017-10-13 LAB — CANCER ANTIGEN 27.29: CA 27.29: 25.6 U/mL (ref 0.0–38.6)

## 2017-10-19 ENCOUNTER — Other Ambulatory Visit (HOSPITAL_COMMUNITY): Payer: Self-pay | Admitting: Pharmacist

## 2017-10-19 ENCOUNTER — Encounter: Payer: Self-pay | Admitting: Nurse Practitioner

## 2017-10-19 ENCOUNTER — Inpatient Hospital Stay: Payer: Medicare Other

## 2017-10-19 ENCOUNTER — Inpatient Hospital Stay (HOSPITAL_BASED_OUTPATIENT_CLINIC_OR_DEPARTMENT_OTHER): Payer: Medicare Other | Admitting: Nurse Practitioner

## 2017-10-19 VITALS — BP 155/96 | HR 60 | Temp 98.0°F | Resp 17 | Ht 67.0 in | Wt 250.2 lb

## 2017-10-19 VITALS — BP 180/63 | HR 65 | Temp 98.5°F | Resp 18

## 2017-10-19 DIAGNOSIS — D701 Agranulocytosis secondary to cancer chemotherapy: Secondary | ICD-10-CM | POA: Diagnosis not present

## 2017-10-19 DIAGNOSIS — T451X5A Adverse effect of antineoplastic and immunosuppressive drugs, initial encounter: Secondary | ICD-10-CM | POA: Diagnosis not present

## 2017-10-19 DIAGNOSIS — D702 Other drug-induced agranulocytosis: Secondary | ICD-10-CM

## 2017-10-19 DIAGNOSIS — I10 Essential (primary) hypertension: Secondary | ICD-10-CM | POA: Diagnosis not present

## 2017-10-19 DIAGNOSIS — C50212 Malignant neoplasm of upper-inner quadrant of left female breast: Secondary | ICD-10-CM | POA: Diagnosis not present

## 2017-10-19 DIAGNOSIS — L270 Generalized skin eruption due to drugs and medicaments taken internally: Secondary | ICD-10-CM

## 2017-10-19 DIAGNOSIS — Z5189 Encounter for other specified aftercare: Secondary | ICD-10-CM | POA: Diagnosis not present

## 2017-10-19 DIAGNOSIS — Z171 Estrogen receptor negative status [ER-]: Principal | ICD-10-CM

## 2017-10-19 DIAGNOSIS — R21 Rash and other nonspecific skin eruption: Secondary | ICD-10-CM

## 2017-10-19 DIAGNOSIS — Z5111 Encounter for antineoplastic chemotherapy: Secondary | ICD-10-CM | POA: Diagnosis not present

## 2017-10-19 DIAGNOSIS — D6481 Anemia due to antineoplastic chemotherapy: Secondary | ICD-10-CM

## 2017-10-19 DIAGNOSIS — C773 Secondary and unspecified malignant neoplasm of axilla and upper limb lymph nodes: Secondary | ICD-10-CM | POA: Diagnosis not present

## 2017-10-19 LAB — COMPREHENSIVE METABOLIC PANEL
ALBUMIN: 3.5 g/dL (ref 3.5–5.0)
ALT: 21 U/L (ref 0–55)
ANION GAP: 9 (ref 3–11)
AST: 23 U/L (ref 5–34)
Alkaline Phosphatase: 47 U/L (ref 40–150)
BILIRUBIN TOTAL: 0.5 mg/dL (ref 0.2–1.2)
BUN: 23 mg/dL (ref 7–26)
CALCIUM: 8.8 mg/dL (ref 8.4–10.4)
CO2: 25 mmol/L (ref 22–29)
Chloride: 105 mmol/L (ref 98–109)
Creatinine, Ser: 1.02 mg/dL (ref 0.60–1.10)
GFR calc Af Amer: >60 mL/min (ref >60)
GFR, EST NON AFRICAN AMERICAN: 53 mL/min — AB (ref >60)
GLUCOSE: 104 mg/dL (ref 70–140)
POTASSIUM: 4.4 mmol/L (ref 3.5–5.1)
Sodium: 139 mmol/L (ref 136–145)
TOTAL PROTEIN: 5.8 g/dL — AB (ref 6.4–8.3)

## 2017-10-19 LAB — CBC WITH DIFFERENTIAL/PLATELET
BASOS PCT: 1 %
Basophils Absolute: 0 10*3/uL (ref 0.0–0.1)
Eosinophils Absolute: 0 10*3/uL (ref 0.0–0.5)
Eosinophils Relative: 2 %
HEMATOCRIT: 22.6 % — AB (ref 34.8–46.6)
HEMOGLOBIN: 7.5 g/dL — AB (ref 11.6–15.9)
LYMPHS ABS: 1 10*3/uL (ref 0.9–3.3)
Lymphocytes Relative: 55 %
MCH: 32.3 pg (ref 25.1–34.0)
MCHC: 33.2 g/dL (ref 31.5–36.0)
MCV: 97.4 fL (ref 79.5–101.0)
MONO ABS: 0.2 10*3/uL (ref 0.1–0.9)
Monocytes Relative: 10 %
NEUTROS ABS: 0.6 10*3/uL — AB (ref 1.5–6.5)
Neutrophils Relative %: 32 %
Platelets: 186 10*3/uL (ref 145–400)
RBC: 2.32 MIL/uL — ABNORMAL LOW (ref 3.70–5.45)
RDW: 18.8 % — ABNORMAL HIGH (ref 11.2–14.5)
WBC: 1.7 10*3/uL — ABNORMAL LOW (ref 3.9–10.3)
nRBC: 2 /100 WBC — ABNORMAL HIGH

## 2017-10-19 LAB — PREPARE RBC (CROSSMATCH)

## 2017-10-19 MED ORDER — SODIUM CHLORIDE 0.9 % IV SOLN
Freq: Once | INTRAVENOUS | Status: AC
  Administered 2017-10-19: 11:00:00 via INTRAVENOUS

## 2017-10-19 MED ORDER — ACETAMINOPHEN 325 MG PO TABS
ORAL_TABLET | ORAL | Status: AC
  Filled 2017-10-19: qty 2

## 2017-10-19 MED ORDER — SODIUM CHLORIDE 0.9 % IV SOLN: 250.0000 mL | Freq: Once | INTRAVENOUS | Status: DC

## 2017-10-19 MED ORDER — DIPHENHYDRAMINE HCL 25 MG PO CAPS
ORAL_CAPSULE | ORAL | Status: AC
  Filled 2017-10-19: qty 1

## 2017-10-19 MED ORDER — PACLITAXEL PROTEIN-BOUND CHEMO INJECTION 100 MG
80.0000 mg/m2 | Freq: Once | INTRAVENOUS | Status: AC
  Administered 2017-10-19: 200 mg via INTRAVENOUS
  Filled 2017-10-19: qty 40

## 2017-10-19 MED ORDER — ONDANSETRON HCL 8 MG PO TABS
ORAL_TABLET | ORAL | Status: AC
  Filled 2017-10-19: qty 1

## 2017-10-19 MED ORDER — ONDANSETRON HCL 8 MG PO TABS
8.0000 mg | ORAL_TABLET | Freq: Once | ORAL | Status: AC
  Administered 2017-10-19: 8 mg via ORAL

## 2017-10-19 MED ORDER — HEPARIN SOD (PORK) LOCK FLUSH 100 UNIT/ML IV SOLN
500.0000 [IU] | Freq: Once | INTRAVENOUS | Status: AC | PRN
  Administered 2017-10-19: 500 [IU]
  Filled 2017-10-19: qty 5

## 2017-10-19 MED ORDER — SODIUM CHLORIDE 0.9% FLUSH
10.0000 mL | INTRAVENOUS | Status: DC | PRN
  Administered 2017-10-19: 10 mL
  Filled 2017-10-19: qty 10

## 2017-10-19 MED ORDER — DIPHENHYDRAMINE HCL 25 MG PO CAPS
25.0000 mg | ORAL_CAPSULE | Freq: Once | ORAL | Status: AC
  Administered 2017-10-19: 25 mg via ORAL

## 2017-10-19 MED ORDER — ACETAMINOPHEN 325 MG PO TABS
650.0000 mg | ORAL_TABLET | Freq: Once | ORAL | Status: AC
  Administered 2017-10-19: 650 mg via ORAL

## 2017-10-19 MED ORDER — HEPARIN SOD (PORK) LOCK FLUSH 100 UNIT/ML IV SOLN
500.0000 [IU] | Freq: Every day | INTRAVENOUS | Status: DC | PRN
  Filled 2017-10-19: qty 5

## 2017-10-19 NOTE — Patient Instructions (Signed)
Implanted Port Home Guide An implanted port is a type of central line that is placed under the skin. Central lines are used to provide IV access when treatment or nutrition needs to be given through a person's veins. Implanted ports are used for long-term IV access. An implanted port may be placed because:  You need IV medicine that would be irritating to the small veins in your hands or arms.  You need long-term IV medicines, such as antibiotics.  You need IV nutrition for a long period.  You need frequent blood draws for lab tests.  You need dialysis.  Implanted ports are usually placed in the chest area, but they can also be placed in the upper arm, the abdomen, or the leg. An implanted port has two main parts:  Reservoir. The reservoir is round and will appear as a small, raised area under your skin. The reservoir is the part where a needle is inserted to give medicines or draw blood.  Catheter. The catheter is a thin, flexible tube that extends from the reservoir. The catheter is placed into a large vein. Medicine that is inserted into the reservoir goes into the catheter and then into the vein.  How will I care for my incision site? Do not get the incision site wet. Bathe or shower as directed by your health care provider. How is my port accessed? Special steps must be taken to access the port:  Before the port is accessed, a numbing cream can be placed on the skin. This helps numb the skin over the port site.  Your health care provider uses a sterile technique to access the port. ? Your health care provider must put on a mask and sterile gloves. ? The skin over your port is cleaned carefully with an antiseptic and allowed to dry. ? The port is gently pinched between sterile gloves, and a needle is inserted into the port.  Only "non-coring" port needles should be used to access the port. Once the port is accessed, a blood return should be checked. This helps ensure that the port  is in the vein and is not clogged.  If your port needs to remain accessed for a constant infusion, a clear (transparent) bandage will be placed over the needle site. The bandage and needle will need to be changed every week, or as directed by your health care provider.  Keep the bandage covering the needle clean and dry. Do not get it wet. Follow your health care provider's instructions on how to take a shower or bath while the port is accessed.  If your port does not need to stay accessed, no bandage is needed over the port.  What is flushing? Flushing helps keep the port from getting clogged. Follow your health care provider's instructions on how and when to flush the port. Ports are usually flushed with saline solution or a medicine called heparin. The need for flushing will depend on how the port is used.  If the port is used for intermittent medicines or blood draws, the port will need to be flushed: ? After medicines have been given. ? After blood has been drawn. ? As part of routine maintenance.  If a constant infusion is running, the port may not need to be flushed.  How long will my port stay implanted? The port can stay in for as long as your health care provider thinks it is needed. When it is time for the port to come out, surgery will be   done to remove it. The procedure is similar to the one performed when the port was put in. When should I seek immediate medical care? When you have an implanted port, you should seek immediate medical care if:  You notice a bad smell coming from the incision site.  You have swelling, redness, or drainage at the incision site.  You have more swelling or pain at the port site or the surrounding area.  You have a fever that is not controlled with medicine.  This information is not intended to replace advice given to you by your health care provider. Make sure you discuss any questions you have with your health care provider. Document  Released: 08/16/2005 Document Revised: 01/22/2016 Document Reviewed: 04/23/2013 Elsevier Interactive Patient Education  2017 [employer]  

## 2017-10-19 NOTE — Progress Notes (Signed)
Per Lacie NP, ok to treat today with ABRAXANE only with labs and 2 units of blood.

## 2017-10-19 NOTE — Progress Notes (Signed)
Per Regan Rakers, NP ok to treat.

## 2017-10-19 NOTE — Patient Instructions (Signed)
Blood Transfusion, Care After This sheet gives you information about how to care for yourself after your procedure. Your doctor may also give you more specific instructions. If you have problems or questions, contact your doctor. Follow these instructions at home:  Take over-the-counter and prescription medicines only as told by your doctor.  Go back to your normal activities as told by your doctor.  Follow instructions from your doctor about how to take care of the area where an IV tube was put into your vein (insertion site). Make sure you: ? Wash your hands with soap and water before you change your bandage (dressing). If there is no soap and water, use hand sanitizer. ? Change your bandage as told by your doctor.  Check your IV insertion site every day for signs of infection. Check for: ? More redness, swelling, or pain. ? More fluid or blood. ? Warmth. ? Pus or a bad smell. Contact a doctor if:  You have more redness, swelling, or pain around the IV insertion site..  You have more fluid or blood coming from the IV insertion site.  Your IV insertion site feels warm to the touch.  You have pus or a bad smell coming from the IV insertion site.  Your pee (urine) turns pink, red, or brown.  You feel weak after doing your normal activities. Get help right away if:  You have signs of a serious allergic or body defense (immune) system reaction, including: ? Itchiness. ? Hives. ? Trouble breathing. ? Anxiety. ? Pain in your chest or lower back. ? Fever, flushing, and chills. ? Fast pulse. ? Rash. ? Watery poop (diarrhea). ? Throwing up (vomiting). ? Dark pee. ? Serious headache. ? Dizziness. ? Stiff neck. ? Yellow color in your face or the white parts of your eyes (jaundice). Summary  After a blood transfusion, return to your normal activities as told by your doctor.  Every day, check for signs of infection where the IV tube was put into your vein.  Some signs of  infection are warm skin, more redness and pain, more fluid or blood, and pus or a bad smell where the needle went in.  Contact your doctor if you feel weak or have any unusual symptoms. This information is not intended to replace advice given to you by your health care provider. Make sure you discuss any questions you have with your health care provider. Document Released: 09/06/2014 Document Revised: 04/09/2016 Document Reviewed: 04/09/2016 Elsevier Interactive Patient Education  2017 [facility] Discharge Instructions for Patients Receiving Chemotherapy  Today you received the following chemotherapy agents Abraxane.  To help prevent nausea and vomiting after your treatment, we encourage you to take your nausea medication as directed  If you develop nausea and vomiting that is not controlled by your nausea medication, call the clinic.   BELOW ARE SYMPTOMS THAT SHOULD BE REPORTED IMMEDIATELY:  *FEVER GREATER THAN 100.5 F  *CHILLS WITH OR WITHOUT FEVER  NAUSEA AND VOMITING THAT IS NOT CONTROLLED WITH YOUR NAUSEA MEDICATION  *UNUSUAL SHORTNESS OF BREATH  *UNUSUAL BRUISING OR BLEEDING  TENDERNESS IN MOUTH AND THROAT WITH OR WITHOUT PRESENCE OF ULCERS  *URINARY PROBLEMS  *BOWEL PROBLEMS  UNUSUAL RASH Items with * indicate a potential emergency and should be followed up as soon as possible.  Feel free to call the clinic should you have any questions or concerns. The clinic phone number is [phone].  Please show the [place]  at check-in to the Emergency Department and triage nurse.

## 2017-10-19 NOTE — Progress Notes (Signed)
[name]  Telephone:([phone] [phone]  Clinic Follow up Note   Patient Care Team: Ronita Hipps, MD as PCP - General (Family Medicine) Coralie Keens, MD as Consulting Physician (General Surgery) Richardo Priest, MD as Consulting Physician (Cardiology) 10/19/2017  CHIEF COMPLAINT: F/u left breast cancer, triple negative, stage IIIB  SUMMARY OF ONCOLOGIC HISTORY: Oncology History   Cancer Staging Malignant neoplasm of upper-inner quadrant of left breast in female, estrogen receptor negative ([place]) Staging form: Breast, AJCC 8th Edition - Clinical stage from 06/10/2017: Stage IIIB (cT2, cN1, cM0, G3, ER: Negative, PR: Negative, HER2: Negative) - Signed by Truitt Merle, MD on 06/21/2017       Malignant neoplasm of upper-inner quadrant of left breast in female, estrogen receptor negative ([name])   06/08/2017 Imaging    DIAGNOSTIC MAMMO and [place] Left Breast 06/08/17 IMPRESSION:  Highly suspicious palpable left breast 11 o'clock mass which measures 2.8 cm in greatest dimension. Ductal extension anteriorly to the level of the nipple, and posteriorly for additional 1.8cm, is suspicious for tumor extension.       06/10/2017 Initial Diagnosis    Malignant neoplasm of upper-inner quadrant of left breast in female, estrogen receptor negative ([name])      06/10/2017 Pathology Results    Diagnosis 06/10/17 1.) Breast, left, needle core biopsy -INVASIVE DUCTAL CARCINOMA, GRADE 3, WITH FOCAL HIGH GRADE DUCTAL CARCINOMA IN SITU -NEOPLASM INVOLVES ALL CORES AND MEASURES APPROXIMATELY 1.5CM IN MAXIMAL LINEAR DIMENSION  2.) Lymph node, needle/core biopsy, left axilla -METASTATIC CARCINOMA CONSISTENT WITH BREAST PRIMARY       06/10/2017 Receptors her2    ER: -, PR-, HER2 not amplified       06/23/2017 Echocardiogram    ECHO 06/23/17 Study Conclusions - Left ventricle: The cavity size was normal. Wall thickness was   increased in a pattern of moderate LVH.  Systolic function was   normal. The estimated ejection fraction was in the range of 60%   to 65%. Wall motion was normal; there were no regional wall   motion abnormalities. Left ventricular diastolic function   parameters were normal. - Mitral valve: Calcified annulus. Mildly thickened leaflets . - Left atrium: The atrium was mildly dilated. - Atrial septum: There was increased thickness of the septum,   consistent with lipomatous hypertrophy. No defect or patent   foramen ovale was identified. - Impressions: Normal GLS - 18.6 Impressions: - Normal GLS - 18.6      06/28/2017 Imaging    CT CAP W Contrast 06/28/17 IMPRESSION: 1. Mildly enlarged left axillary lymph node at 1.5 cm, a significant change from the prior chest CT from 2015, suspicious for left axillary metastatic disease. 2. Left upper breast lesion 2.1 cm in diameter with higher density margins and lower density center, favoring lumpectomy site. 3. Mildly enlarged right hilar lymph node, but stable compared back through 2015. 4. No findings of osseous metastatic disease or additional metastatic sites. 5. Other imaging findings of potential clinical significance: Aberrant right subclavian artery passes behind the esophagus. Aortic Atherosclerosis (ICD10-I70.0) and Emphysema (ICD10-J43.9). Coronary atherosclerosis. Mild nodular enlargement of the left inferior thyroid lobe, isodense to the rest of the thyroid. Airway thickening is present, suggesting bronchitis or reactive airways disease. Left hepatic lobe cyst. Left renal cyst. Likely small cysts in the right kidney. Sigmoid colon diverticulosis.       06/28/2017 Imaging    Bone Scan 06/28/17 IMPRESSION: No evidence of osseous metastatic disease. Subtle increased radiotracer uptake within the left chest  wall, likely within the left breast.      06/30/2017 Surgery    Port placement by Dr. [name] on 06/30/17      07/01/2017 -  Chemotherapy    Neoadjuvant  Adriamycin and Cytoxan (AC) every 2 weeks for 4 cycles starting 07/01/17. Dose reduced starting with cycle 2. Compelted AC on 12.19.18. Started weekly Carbo and Taxol every on 08/31/17        07/23/2017 - 07/26/2017 Hospital Admission    Admit date: 07/23/17 Admission diagnosis: 07/26/17 Additional comments: febrile neutropenia; diverticulitis      08/29/2017 Mammogram    Diagnostic Mammogram and [place] 08/29/17  IMPRESSION:  Interval decrease in the size of known left 11 o'clock malignancy, with greatest dimension of 1.3cm.  Interval decrease in size of known left malignant axillary lymph node, with cortical thickness of 3 mm.        09/09/2017 Genetic Testing    The patient had genetic testing due to a personal history of breast cancer and skin cancer,  and a family history of breast cancer, pancreatic cancer, melanoma, and colon cancer.  The Common Hereditary Cancer Panel + Melanoma Panel + Basal Cell Nevus Syndrome Panel was ordered (58 genes).  The following genes were evaluated for sequence changes and exonic deletions/duplications: APC, ATM, AXIN2, BAP1, BARD1, BMPR1A, BRCA1, BRCA2, BRIP1, CDH1, CDK4, CDKN2A (p14ARF), CDKN2A (p16INK4a), CHEK2, CTNNA1, DICER1, EPCAM*, FANCC, GREM1*, KIT, MC1R, MEN1, MLH1, MSH2, MSH3, MSH6, MUTYH, NBN, NF1, PALB2, PALLD, PDGFRA, PMS2, POLD1, POLE, POT1, PTCH1, PTCH2, PTEN, RAD50, RAD51C, RAD51D, RB1, SDHB, SDHC, SDHD, SMAD4, SMARCA4, STK11, SUFU, TERT, TP53, TSC1, TSC2, VHL. The following genes were evaluated for sequence changes only:HOXB13*, MITF*, NTHL1*, SDHA  Results: Negative, no pathogenic variants identified. The date of this test report is 09/09/2017.      CURRENT THERAPY: Neoadjuvant Adriamycin and Cytoxan (AC) every 2 weeks for 4 cycles starting 07/01/17, reduced 10% due to poor toleration starting with cycle 2. Completed on 08/17/17. Start Carbo and taxol weekly for 12 cycles 08/31/17. Taxol changed to abraxane on 1/23 due to suspected taxol drug rash,  carboplatin decreased with cycle 4 for mild thrombocytopenia  INTERVAL HISTORY: Ms. Hird returns for follow-up as scheduled prior to cycle 8 weekly Abraxane/carboplatin.  Last treated on 10/12/2017.  Today she feels well, no increase in her baseline fatigue.  Eating and drinking well, no nausea or vomiting.  She had one episode of diarrhea 2 days ago, resolved on its own without Imodium.  Her skin rash continues to improve, no itching.  She has mild intermittent tingling to fingertips of both hands.  Not interfering with function or grip.  Denies fever or chills, cough, chest pain.  She has dyspnea on exertion, at baseline. BP is elevated today; she did not take Benicar yet.  She checks it at home, ranges in the 110's/60s.  Otherwise she has no complaints today.  REVIEW OF SYSTEMS:   Constitutional: Denies fevers, chills or abnormal weight loss (+) fatigue, at baseline  Eyes: Denies blurriness of vision Ears, nose, mouth, throat, and face: Denies mucositis or sore throat Respiratory: Denies cough or wheezes (+) DOE, at baseline  Cardiovascular: Denies palpitation, chest discomfort or lower extremity swelling Gastrointestinal:  Denies nausea, vomiting, constipation, heartburn or change in bowel habits (+) diarrhea 2 days ago, 2 episodes; resolved without medication Skin: (+) abnormal skin rash to arms, legs, and chest, improving  Lymphatics: Denies new lymphadenopathy or easy bruising Neurological:Denies numbness or new weaknesses (+) intermittent tingling to fingertips, does not  affect function Behavioral/Psych: Mood is stable, no new changes  All other systems were reviewed with the patient and are negative.  MEDICAL HISTORY:  Past Medical History:  Diagnosis Date  . Breast cancer ([place])   . Cancer [facility])    left breast cancer  . Dysrhythmia    PACs and nonsustained runs of atrial tach by Holter 10/13/16  . Family history of breast cancer   . Family history of colon cancer   . Family  history of melanoma   . Family history of pancreatic cancer   . History of kidney stones   . Hypertension   . Personal history of colonic polyps   . Personal history of skin cancer     SURGICAL HISTORY: Past Surgical History:  Procedure Laterality Date  . COLONOSCOPY    . FOOT SURGERY Right   . IR CV LINE INJECTION  08/02/2017  . left breast lumpectomy  2000  . PORTACATH PLACEMENT Right 06/30/2017   Procedure: [place];  Surgeon: Coralie Keens, MD;  Location: [place];  Service: General;  Laterality: Right;    I have reviewed the social history and family history with the patient and they are unchanged from previous note.  ALLERGIES:  has No Known Allergies.  MEDICATIONS:  Current Outpatient Medications  Medication Sig Dispense Refill  . atorvastatin (LIPITOR) 10 MG tablet Take 10 mg by mouth at bedtime.    . calcium carbonate (OS-CAL) 600 MG TABS tablet Take 1,200 mg by mouth daily.    . Cholecalciferol (VITAMIN D3) 2000 units TABS Take 2,000 Units by mouth daily.     . cloNIDine (CATAPRES) 0.1 MG tablet Take 0.1 mg by mouth 2 (two) times daily.    Marland Kitchen escitalopram (LEXAPRO) 5 MG tablet Take 5 mg by mouth daily.    . hydrALAZINE (APRESOLINE) 25 MG tablet Take 12.5 mg by mouth 3 (three) times daily.    . magnesium oxide (MAG-OX) 400 MG tablet Take 400 mg by mouth daily.    . Multiple Vitamins-Minerals (EYE VITAMINS PO) Take 1 capsule by mouth 2 (two) times daily.    Marland Kitchen olmesartan-hydrochlorothiazide (BENICAR HCT) 40-25 MG tablet Take 1 tablet by mouth daily.  1  . vitamin B-12 (CYANOCOBALAMIN) 1000 MCG tablet Take 1 tablet by mouth daily.    Marland Kitchen acebutolol (SECTRAL) 200 MG capsule Take 200 mg by mouth 2 (two) times daily.    Marland Kitchen aspirin EC 81 MG tablet Take 81 mg by mouth daily.    . DOCOSAHEXAENOIC ACID PO Take 1,200 mg by mouth 2 (two) times daily.    Marland Kitchen lidocaine-prilocaine (EMLA) cream Apply 1 application topically as needed. 30 g 2  . loratadine  (CLARITIN) 10 MG tablet Take 10 mg by mouth daily.    Marland Kitchen LORazepam (ATIVAN) 1 MG tablet Take 1 tablet (1 mg total) by mouth once as needed for anxiety. Take 1 tab 1 hour before MRI scan 2 tablet 0  . ondansetron (ZOFRAN) 8 MG tablet Take 1 tablet (8 mg total) by mouth every 8 (eight) hours as needed for nausea or vomiting. 20 tablet 2  . prochlorperazine (COMPAZINE) 10 MG tablet Take 1 tablet (10 mg total) by mouth every 6 (six) hours as needed for nausea or vomiting. 30 tablet 2   No current facility-administered medications for this visit.    Facility-Administered Medications Ordered in Other Visits  Medication Dose Route Frequency Provider Last Rate Last Dose  . 0.9 %  sodium chloride infusion  250 mL  Intravenous Once Cira Rue K, NP      . heparin lock flush 100 unit/mL  500 Units Intracatheter Once PRN Truitt Merle, MD      . heparin lock flush 100 unit/mL  500 Units Intracatheter Daily PRN Cira Rue K, NP      . sodium chloride flush (NS) 0.9 % injection 10 mL  10 mL Intravenous PRN Truitt Merle, MD   10 mL at 07/29/17 1310  . sodium chloride flush (NS) 0.9 % injection 10 mL  10 mL Intracatheter PRN Truitt Merle, MD        PHYSICAL EXAMINATION: ECOG PERFORMANCE STATUS: 2 - Symptomatic, <50% confined to bed  Vitals:   10/19/17 0938  BP: (!) 155/96  Pulse: 60  Resp: 17  Temp: 98 F (36.7 C)  SpO2: 100%   Filed Weights   10/19/17 0938  Weight: 250 lb 3.2 oz (113.5 kg)    GENERAL:alert, no distress and comfortable SKIN: skin color, texture, turgor are normal (+) scattered faint erythematous rash to arms, chest, and legs EYES: normal, Conjunctiva are pink and non-injected, sclera clear OROPHARYNX:no exudate, no erythema and lips, buccal mucosa, and tongue normal  NECK: supple, thyroid normal size, non-tender, without nodularity LYMPH:  no palpable cervical, supraclavicular or axillary lymphadenopathy  LUNGS: clear to auscultation bilaterally with normal breathing effort HEART:  regular rate & rhythm and no murmurs and no lower extremity edema ABDOMEN:abdomen soft, non-tender and normal bowel sounds Musculoskeletal:no cyanosis of digits and no clubbing  NEURO: alert & oriented x 3 with fluent speech, no focal motor/sensory deficits BREAST: exam deferred today PAC without erythema   LABORATORY DATA:  I have reviewed the data as listed CBC Latest Ref Rng & Units 10/19/2017 10/12/2017 10/05/2017  WBC 3.9 - 10.3 K/uL 1.7(L) 3.5(L) 3.3(L)  Hemoglobin 11.6 - 15.9 g/dL 7.5(L) 8.3(L) 9.3(L)  Hematocrit 34.8 - 46.6 % 22.6(L) 25.5(L) 28.0(L)  Platelets 145 - 400 K/uL 186 140(L) 109(L)     CMP Latest Ref Rng & Units 10/19/2017 10/12/2017 10/05/2017  Glucose 70 - 140 mg/dL 104 99 93  BUN 7 - 26 mg/dL 23 30(H) 31(H)  Creatinine 0.60 - 1.10 mg/dL 1.02 1.28(H) 0.94  Sodium 136 - 145 mmol/L 139 138 138  Potassium 3.5 - 5.1 mmol/L 4.4 4.2 3.9  Chloride 98 - 109 mmol/L 105 102 104  CO2 22 - 29 mmol/L '25 26 26  ' Calcium 8.4 - 10.4 mg/dL 8.8 9.1 9.0  Total Protein 6.4 - 8.3 g/dL 5.8(L) 5.8(L) 6.0(L)  Total Bilirubin 0.2 - 1.2 mg/dL 0.5 0.8 0.6  Alkaline Phos 40 - 150 U/L 47 50 54  AST 5 - 34 U/L '23 24 19  ' ALT 0 - 55 U/L '21 20 19   ' PATHOLOGY:  Diagnosis 06/10/17 1.) Breast, left, needle core biopsy -INVASIVE DUCTAL CARCINOMA, GRADE 3, WITH FOCAL HIGH GRADE DUCTAL CARCINOMA IN SITU -NEOPLASM INVOLVES ALL CORES AND MEASURES APPROXIMATELY 1.5CM IN MAXIMAL LINEAR DIMENSION -A BREAST PROGNOSTIC PROFILE WILL BE ORDERED ON BLOCK 1A AND SEPARATELY REPORTED   2.) Lymph node, needle/core biopsy, left axilla -METASTATIC CARCINOMA CONSISTENT WITH BREAST PRIMARY    PROCEDURES  ECHO 06/23/17 Study Conclusions - Left ventricle: The cavity size was normal. Wall thickness was increased in a pattern of moderate LVH. Systolic function was normal. The estimated ejection fraction was in the range of 60% to 65%. Wall motion was normal; there were no regional wall motion  abnormalities. Left ventricular diastolic function parameters were normal. - Mitral valve: Calcified annulus.  Mildly thickened leaflets . - Left atrium: The atrium was mildly dilated. - Atrial septum: There was increased thickness of the septum, consistent with lipomatous hypertrophy. No defect or patent foramen ovale was identified. - Impressions: Normal GLS - 18.6 Impressions: - Normal GLS - 18.6    RADIOGRAPHIC STUDIES: I have personally reviewed the radiological images as listed and agreed with the findings in the report. No results found.   ASSESSMENT & PLAN: Irys Nigh Hulinis a wonderful 72 y.o.femalewho presents today to discuss the ongoing management of her left breast cancer.   1. 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 -Ms. Laseter appears stable today. She has completed cycle 6 neoadjuvant abraxane/taxol. Cycle 5 was omitted for thrombocytopenia, dehydration, and diarrhea. She tolerated last cycle overall. Weight stable. BP elevated; she did not take benicar yet today. CMP unremarkable. Worsening neutropenia and anemia on CBC; ANC 0.6, Hgb 7.5. She is afebrile. No increased fatigue or dyspnea. Labs reviewed with Dr. Burr Medico. She recommends proceed with abraxane only and transfuse 2 units RBCs. She will return in 1 week for cycle 9, f/u with Dr. Burr Medico in 2 weeks.  2. Decreased appetite, weight loss -Resolved; weight is stable  3. Skin rash - face, chest, arms, back, likely related to Taxol -Continues to improve; does not itch. No new areas. Will monitor.   4. Diarrhea -She had 2 episodes of diarrhea 2 days ago, resolved spontaneously without imodium. This is improved overall  5. Chemotherapy induced anemia, thrombocytopenia, neutropenia  -Platelets have recovered. She has worsening neutropenia and anemia on CBC; ANC 0.6, Hgb 7.5. She is afebrile. No  increased fatigue or dyspnea. Labs reviewed with Dr. Burr Medico. She recommends proceed with abraxane only and transfuse 2 units RBCs. We reviewed neutropenic precautions; she knows to contact our clinic with fever, cough, dysuria, diarrhea, or changes in PAC appearance. I will request insurance approval for granix and schedule accordingly.   6. HTN -She is on clonidine, hydralazine, and olmesartan-HCTZ; she did not take benicar yet today. BP at home ranges 110's/60's.  She has had dizziness with hypotension in the past, she occasionally holds BP meds if she feels dizzy. Will monitor frequently today during chemo and blood products.   PLAN -Labs reviewed with Dr. Burr Medico, proceed with cycle 8 chemotherapy today with abraxane only  -2 units RBCs today with chemo -Return in 1 week for cycle 9 -F/u with Dr. Burr Medico in 2 weeks   -Pending insurance approval for Granix  Orders Placed This Encounter  Procedures  . Practitioner attestation of consent    I, the ordering practitioner, attest that I have discussed with the patient the benefits, risks, side effects, alternatives, likelihood of achieving goals and potential problems during recovery for the procedure listed.    Standing Status:   Future    Standing Expiration Date:   10/19/2018    Order Specific Question:   Procedure    Answer:   Blood Product(s)  . Complete patient signature process for consent form    Standing Status:   Future    Standing Expiration Date:   10/19/2018  . Care order/instruction    Transfuse Parameters    Standing Status:   Future    Standing Expiration Date:   10/19/2018  . Type and screen    Standing Status:   Future    Number of Occurrences:   1    Standing Expiration Date:  10/19/2018   All questions were answered. The patient knows to call the clinic with any problems, questions or concerns. No barriers to learning was detected. I spent 20 minutes counseling the patient face to face. The total time spent in the appointment  was 25 minutes and more than 50% was on counseling and review of test results     Alla Feeling, NP 10/19/17

## 2017-10-20 LAB — BPAM RBC
BLOOD PRODUCT EXPIRATION DATE: 201903072359
BLOOD PRODUCT EXPIRATION DATE: 201903072359
ISSUE DATE / TIME: 201902201301
ISSUE DATE / TIME: 201902201302
UNIT TYPE AND RH: 6200
Unit Type and Rh: 6200

## 2017-10-20 LAB — TYPE AND SCREEN
ABO/RH(D): A POS
Antibody Screen: NEGATIVE
Unit division: 0
Unit division: 0

## 2017-10-21 ENCOUNTER — Other Ambulatory Visit: Payer: Self-pay | Admitting: *Deleted

## 2017-10-21 ENCOUNTER — Other Ambulatory Visit: Payer: Self-pay | Admitting: Nurse Practitioner

## 2017-10-21 ENCOUNTER — Telehealth: Payer: Self-pay | Admitting: Hematology

## 2017-10-21 DIAGNOSIS — D702 Other drug-induced agranulocytosis: Secondary | ICD-10-CM

## 2017-10-21 NOTE — Addendum Note (Signed)
Addended by: Truitt Merle on: 10/21/2017 03:01 PM   Modules accepted: Orders

## 2017-10-21 NOTE — Telephone Encounter (Signed)
Called patient to schedule injection - she was unsure about it so I transferred her to Thu to talk about the injection.

## 2017-10-22 ENCOUNTER — Inpatient Hospital Stay: Payer: Medicare Other

## 2017-10-22 VITALS — BP 162/62 | HR 73 | Temp 98.2°F | Resp 18

## 2017-10-22 DIAGNOSIS — C50212 Malignant neoplasm of upper-inner quadrant of left female breast: Secondary | ICD-10-CM | POA: Diagnosis not present

## 2017-10-22 DIAGNOSIS — D6481 Anemia due to antineoplastic chemotherapy: Secondary | ICD-10-CM | POA: Diagnosis not present

## 2017-10-22 DIAGNOSIS — C773 Secondary and unspecified malignant neoplasm of axilla and upper limb lymph nodes: Secondary | ICD-10-CM | POA: Diagnosis not present

## 2017-10-22 DIAGNOSIS — Z171 Estrogen receptor negative status [ER-]: Secondary | ICD-10-CM

## 2017-10-22 DIAGNOSIS — Z5189 Encounter for other specified aftercare: Secondary | ICD-10-CM | POA: Diagnosis not present

## 2017-10-22 DIAGNOSIS — Z95828 Presence of other vascular implants and grafts: Secondary | ICD-10-CM

## 2017-10-22 DIAGNOSIS — Z5111 Encounter for antineoplastic chemotherapy: Secondary | ICD-10-CM | POA: Diagnosis not present

## 2017-10-22 DIAGNOSIS — T451X5A Adverse effect of antineoplastic and immunosuppressive drugs, initial encounter: Secondary | ICD-10-CM | POA: Diagnosis not present

## 2017-10-22 MED ORDER — FILGRASTIM 480 MCG/0.8ML IJ SOSY
480.0000 ug | PREFILLED_SYRINGE | Freq: Once | INTRAMUSCULAR | Status: AC
  Administered 2017-10-22: 480 ug via SUBCUTANEOUS

## 2017-10-22 MED ORDER — TBO-FILGRASTIM 480 MCG/0.8ML ~~LOC~~ SOSY
PREFILLED_SYRINGE | SUBCUTANEOUS | Status: AC
  Filled 2017-10-22: qty 0.8

## 2017-10-22 NOTE — Patient Instructions (Signed)
Tbo-Filgrastim injection What is this medicine? TBO-FILGRASTIM (T B O fil GRA stim) is a granulocyte colony-stimulating factor that stimulates the growth of neutrophils, a type of white blood cell important in the body's fight against infection. It is used to reduce the incidence of fever and infection in patients with certain types of cancer who are receiving chemotherapy that affects the bone marrow. This medicine may be used for other purposes; ask your health care provider or pharmacist if you have questions. COMMON BRAND NAME(S): Granix What should I tell my health care provider before I take this medicine? They need to know if you have any of these conditions: -bone scan or tests planned -kidney disease -sickle cell anemia -an unusual or allergic reaction to tbo-filgrastim, filgrastim, pegfilgrastim, other medicines, foods, dyes, or preservatives -pregnant or trying to get pregnant -breast-feeding How should I use this medicine? This medicine is for injection under the skin. If you get this medicine at home, you will be taught how to prepare and give this medicine. Refer to the Instructions for Use that come with your medication packaging. Use exactly as directed. Take your medicine at regular intervals. Do not take your medicine more often than directed. It is important that you put your used needles and syringes in a special sharps container. Do not put them in a trash can. If you do not have a sharps container, call your pharmacist or healthcare provider to get one. Talk to your pediatrician regarding the use of this medicine in children. Special care may be needed. Overdosage: If you think you have taken too much of this medicine contact a poison control center or emergency room at once. NOTE: This medicine is only for you. Do not share this medicine with others. What if I miss a dose? It is important not to miss your dose. Call your doctor or health care professional if you miss a  dose. What may interact with this medicine? This medicine may interact with the following medications: -medicines that may cause a release of neutrophils, such as lithium This list may not describe all possible interactions. Give your health care provider a list of all the medicines, herbs, non-prescription drugs, or dietary supplements you use. Also tell them if you smoke, drink alcohol, or use illegal drugs. Some items may interact with your medicine. What should I watch for while using this medicine? You may need blood work done while you are taking this medicine. What side effects may I notice from receiving this medicine? Side effects that you should report to your doctor or health care professional as soon as possible: -allergic reactions like skin rash, itching or hives, swelling of the face, lips, or tongue -blood in the urine -dark urine -dizziness -fast heartbeat -feeling faint -shortness of breath or breathing problems -signs and symptoms of infection like fever or chills; cough; or sore throat -signs and symptoms of kidney injury like trouble passing urine or change in the amount of urine -stomach or side pain, or pain at the shoulder -sweating -swelling of the legs, ankles, or abdomen -tiredness Side effects that usually do not require medical attention (report to your doctor or health care professional if they continue or are bothersome): -bone pain -headache -muscle pain -vomiting This list may not describe all possible side effects. Call your doctor for medical advice about side effects. You may report side effects to FDA at 1-800-FDA-1088. Where should I keep my medicine? Keep out of the reach of children. Store in a refrigerator between   2 and 8 degrees C (36 and 46 degrees F). Keep in carton to protect from light. Throw away this medicine if it is left out of the refrigerator for more than 5 consecutive days. Throw away any unused medicine after the expiration  date. NOTE: This sheet is a summary. It may not cover all possible information. If you have questions about this medicine, talk to your doctor, pharmacist, or health care provider.  2018 Elsevier/Gold Standard (2015-10-06 19:07:04)  

## 2017-10-26 ENCOUNTER — Inpatient Hospital Stay: Payer: Medicare Other

## 2017-10-26 VITALS — BP 166/64 | HR 64 | Temp 98.5°F | Resp 18

## 2017-10-26 DIAGNOSIS — C50212 Malignant neoplasm of upper-inner quadrant of left female breast: Secondary | ICD-10-CM | POA: Diagnosis not present

## 2017-10-26 DIAGNOSIS — C773 Secondary and unspecified malignant neoplasm of axilla and upper limb lymph nodes: Secondary | ICD-10-CM | POA: Diagnosis not present

## 2017-10-26 DIAGNOSIS — Z5111 Encounter for antineoplastic chemotherapy: Secondary | ICD-10-CM | POA: Diagnosis not present

## 2017-10-26 DIAGNOSIS — Z95828 Presence of other vascular implants and grafts: Secondary | ICD-10-CM

## 2017-10-26 DIAGNOSIS — Z171 Estrogen receptor negative status [ER-]: Principal | ICD-10-CM

## 2017-10-26 DIAGNOSIS — D6481 Anemia due to antineoplastic chemotherapy: Secondary | ICD-10-CM | POA: Diagnosis not present

## 2017-10-26 DIAGNOSIS — T451X5A Adverse effect of antineoplastic and immunosuppressive drugs, initial encounter: Secondary | ICD-10-CM | POA: Diagnosis not present

## 2017-10-26 DIAGNOSIS — Z5189 Encounter for other specified aftercare: Secondary | ICD-10-CM | POA: Diagnosis not present

## 2017-10-26 LAB — COMPREHENSIVE METABOLIC PANEL
ALT: 24 U/L (ref 0–55)
ANION GAP: 9 (ref 3–11)
AST: 26 U/L (ref 5–34)
Albumin: 3.6 g/dL (ref 3.5–5.0)
Alkaline Phosphatase: 56 U/L (ref 40–150)
BUN: 17 mg/dL (ref 7–26)
CALCIUM: 9.4 mg/dL (ref 8.4–10.4)
CHLORIDE: 105 mmol/L (ref 98–109)
CO2: 26 mmol/L (ref 22–29)
Creatinine, Ser: 1 mg/dL (ref 0.60–1.10)
GFR, EST NON AFRICAN AMERICAN: 54 mL/min — AB (ref >60)
Glucose, Bld: 119 mg/dL (ref 70–140)
Potassium: 3.9 mmol/L (ref 3.5–5.1)
SODIUM: 140 mmol/L (ref 136–145)
Total Bilirubin: 0.5 mg/dL (ref 0.2–1.2)
Total Protein: 5.9 g/dL — ABNORMAL LOW (ref 6.4–8.3)

## 2017-10-26 LAB — CBC WITH DIFFERENTIAL/PLATELET
Basophils Absolute: 0 10*3/uL (ref 0.0–0.1)
Basophils Relative: 1 %
EOS ABS: 0 10*3/uL (ref 0.0–0.5)
EOS PCT: 1 %
HCT: 27.5 % — ABNORMAL LOW (ref 34.8–46.6)
Hemoglobin: 9.4 g/dL — ABNORMAL LOW (ref 11.6–15.9)
LYMPHS ABS: 1.2 10*3/uL (ref 0.9–3.3)
LYMPHS PCT: 30 %
MCH: 32.5 pg (ref 25.1–34.0)
MCHC: 34.1 g/dL (ref 31.5–36.0)
MCV: 95.4 fL (ref 79.5–101.0)
MONO ABS: 0.7 10*3/uL (ref 0.1–0.9)
MONOS PCT: 17 %
Neutro Abs: 2 10*3/uL (ref 1.5–6.5)
Neutrophils Relative %: 51 %
PLATELETS: 165 10*3/uL (ref 145–400)
RBC: 2.88 MIL/uL — ABNORMAL LOW (ref 3.70–5.45)
RDW: 20.2 % — ABNORMAL HIGH (ref 11.2–14.5)
WBC: 3.9 10*3/uL (ref 3.9–10.3)

## 2017-10-26 MED ORDER — HEPARIN SOD (PORK) LOCK FLUSH 100 UNIT/ML IV SOLN
500.0000 [IU] | Freq: Once | INTRAVENOUS | Status: AC | PRN
  Administered 2017-10-26: 500 [IU]
  Filled 2017-10-26: qty 5

## 2017-10-26 MED ORDER — PACLITAXEL PROTEIN-BOUND CHEMO INJECTION 100 MG
80.0000 mg/m2 | Freq: Once | INTRAVENOUS | Status: AC
  Administered 2017-10-26: 200 mg via INTRAVENOUS
  Filled 2017-10-26: qty 40

## 2017-10-26 MED ORDER — DEXAMETHASONE SODIUM PHOSPHATE 10 MG/ML IJ SOLN
10.0000 mg | Freq: Once | INTRAMUSCULAR | Status: AC
  Administered 2017-10-26: 10 mg via INTRAVENOUS

## 2017-10-26 MED ORDER — SODIUM CHLORIDE 0.9 % IV SOLN
181.6500 mg | Freq: Once | INTRAVENOUS | Status: AC
  Administered 2017-10-26: 180 mg via INTRAVENOUS
  Filled 2017-10-26: qty 18

## 2017-10-26 MED ORDER — PALONOSETRON HCL INJECTION 0.25 MG/5ML
INTRAVENOUS | Status: AC
  Filled 2017-10-26: qty 5

## 2017-10-26 MED ORDER — SODIUM CHLORIDE 0.9 % IV SOLN
Freq: Once | INTRAVENOUS | Status: AC
  Administered 2017-10-26: 09:00:00 via INTRAVENOUS

## 2017-10-26 MED ORDER — SODIUM CHLORIDE 0.9% FLUSH
10.0000 mL | INTRAVENOUS | Status: DC | PRN
  Administered 2017-10-26: 10 mL
  Filled 2017-10-26: qty 10

## 2017-10-26 MED ORDER — SODIUM CHLORIDE 0.9% FLUSH
10.0000 mL | INTRAVENOUS | Status: DC | PRN
  Administered 2017-10-26: 10 mL via INTRAVENOUS
  Filled 2017-10-26: qty 10

## 2017-10-26 MED ORDER — DEXAMETHASONE SODIUM PHOSPHATE 10 MG/ML IJ SOLN
INTRAMUSCULAR | Status: AC
  Filled 2017-10-26: qty 1

## 2017-10-26 MED ORDER — PALONOSETRON HCL INJECTION 0.25 MG/5ML
0.2500 mg | Freq: Once | INTRAVENOUS | Status: AC
  Administered 2017-10-26: 0.25 mg via INTRAVENOUS

## 2017-10-26 NOTE — Patient Instructions (Signed)
[facility] Discharge Instructions for Patients Receiving Chemotherapy  Today you received the following chemotherapy agents Abraxane,carboplatin  To help prevent nausea and vomiting after your treatment, we encourage you to take your nausea medication as directed  If you develop nausea and vomiting that is not controlled by your nausea medication, call the clinic.   BELOW ARE SYMPTOMS THAT SHOULD BE REPORTED IMMEDIATELY:  *FEVER GREATER THAN 100.5 F  *CHILLS WITH OR WITHOUT FEVER  NAUSEA AND VOMITING THAT IS NOT CONTROLLED WITH YOUR NAUSEA MEDICATION  *UNUSUAL SHORTNESS OF BREATH  *UNUSUAL BRUISING OR BLEEDING  TENDERNESS IN MOUTH AND THROAT WITH OR WITHOUT PRESENCE OF ULCERS  *URINARY PROBLEMS  *BOWEL PROBLEMS  UNUSUAL RASH Items with * indicate a potential emergency and should be followed up as soon as possible.  Feel free to call the clinic should you have any questions or concerns. The clinic phone number is [phone].  Please show the [facility] at check-in to the Emergency Department and triage nurse.

## 2017-10-27 ENCOUNTER — Inpatient Hospital Stay: Payer: Medicare Other

## 2017-10-27 DIAGNOSIS — C50212 Malignant neoplasm of upper-inner quadrant of left female breast: Secondary | ICD-10-CM

## 2017-10-27 DIAGNOSIS — D6481 Anemia due to antineoplastic chemotherapy: Secondary | ICD-10-CM | POA: Diagnosis not present

## 2017-10-27 DIAGNOSIS — C773 Secondary and unspecified malignant neoplasm of axilla and upper limb lymph nodes: Secondary | ICD-10-CM | POA: Diagnosis not present

## 2017-10-27 DIAGNOSIS — Z95828 Presence of other vascular implants and grafts: Secondary | ICD-10-CM

## 2017-10-27 DIAGNOSIS — Z5189 Encounter for other specified aftercare: Secondary | ICD-10-CM | POA: Diagnosis not present

## 2017-10-27 DIAGNOSIS — Z5111 Encounter for antineoplastic chemotherapy: Secondary | ICD-10-CM | POA: Diagnosis not present

## 2017-10-27 DIAGNOSIS — T451X5A Adverse effect of antineoplastic and immunosuppressive drugs, initial encounter: Secondary | ICD-10-CM | POA: Diagnosis not present

## 2017-10-27 DIAGNOSIS — Z171 Estrogen receptor negative status [ER-]: Secondary | ICD-10-CM

## 2017-10-27 MED ORDER — TBO-FILGRASTIM 480 MCG/0.8ML ~~LOC~~ SOSY
480.0000 ug | PREFILLED_SYRINGE | Freq: Once | SUBCUTANEOUS | Status: AC
  Administered 2017-10-27: 480 ug via SUBCUTANEOUS

## 2017-10-27 MED ORDER — TBO-FILGRASTIM 480 MCG/0.8ML ~~LOC~~ SOSY
PREFILLED_SYRINGE | SUBCUTANEOUS | Status: AC
  Filled 2017-10-27: qty 0.8

## 2017-10-27 NOTE — Patient Instructions (Signed)
Tbo-Filgrastim injection What is this medicine? TBO-FILGRASTIM (T B O fil GRA stim) is a granulocyte colony-stimulating factor that stimulates the growth of neutrophils, a type of white blood cell important in the body's fight against infection. It is used to reduce the incidence of fever and infection in patients with certain types of cancer who are receiving chemotherapy that affects the bone marrow. This medicine may be used for other purposes; ask your health care provider or pharmacist if you have questions. COMMON BRAND NAME(S): Granix What should I tell my health care provider before I take this medicine? They need to know if you have any of these conditions: -bone scan or tests planned -kidney disease -sickle cell anemia -an unusual or allergic reaction to tbo-filgrastim, filgrastim, pegfilgrastim, other medicines, foods, dyes, or preservatives -pregnant or trying to get pregnant -breast-feeding How should I use this medicine? This medicine is for injection under the skin. If you get this medicine at home, you will be taught how to prepare and give this medicine. Refer to the Instructions for Use that come with your medication packaging. Use exactly as directed. Take your medicine at regular intervals. Do not take your medicine more often than directed. It is important that you put your used needles and syringes in a special sharps container. Do not put them in a trash can. If you do not have a sharps container, call your pharmacist or healthcare provider to get one. Talk to your pediatrician regarding the use of this medicine in children. Special care may be needed. Overdosage: If you think you have taken too much of this medicine contact a poison control center or emergency room at once. NOTE: This medicine is only for you. Do not share this medicine with others. What if I miss a dose? It is important not to miss your dose. Call your doctor or health care professional if you miss a  dose. What may interact with this medicine? This medicine may interact with the following medications: -medicines that may cause a release of neutrophils, such as lithium This list may not describe all possible interactions. Give your health care provider a list of all the medicines, herbs, non-prescription drugs, or dietary supplements you use. Also tell them if you smoke, drink alcohol, or use illegal drugs. Some items may interact with your medicine. What should I watch for while using this medicine? You may need blood work done while you are taking this medicine. What side effects may I notice from receiving this medicine? Side effects that you should report to your doctor or health care professional as soon as possible: -allergic reactions like skin rash, itching or hives, swelling of the face, lips, or tongue -blood in the urine -dark urine -dizziness -fast heartbeat -feeling faint -shortness of breath or breathing problems -signs and symptoms of infection like fever or chills; cough; or sore throat -signs and symptoms of kidney injury like trouble passing urine or change in the amount of urine -stomach or side pain, or pain at the shoulder -sweating -swelling of the legs, ankles, or abdomen -tiredness Side effects that usually do not require medical attention (report to your doctor or health care professional if they continue or are bothersome): -bone pain -headache -muscle pain -vomiting This list may not describe all possible side effects. Call your doctor for medical advice about side effects. You may report side effects to FDA at 1-800-FDA-1088. Where should I keep my medicine? Keep out of the reach of children. Store in a refrigerator between   2 and 8 degrees C (36 and 46 degrees F). Keep in carton to protect from light. Throw away this medicine if it is left out of the refrigerator for more than 5 consecutive days. Throw away any unused medicine after the expiration  date. NOTE: This sheet is a summary. It may not cover all possible information. If you have questions about this medicine, talk to your doctor, pharmacist, or health care provider.  2018 Elsevier/Gold Standard (2015-10-06 19:07:04)  

## 2017-11-01 NOTE — Progress Notes (Signed)
[place]  [phone] [phone]  Clinic Follow Up Note   Patient Care Team: Ronita Hipps, MD as PCP - General (Family Medicine) Coralie Keens, MD as Consulting Physician (General Surgery) Richardo Priest, MD as Consulting Physician (Cardiology)   Date of Service:  11/02/2017  CHIEF COMPLAINTS  F/u left breast cancer, triple negative, stage IIIB  ONCOLOGY HISTORY: Oncology History   Cancer Staging Malignant neoplasm of upper-inner quadrant of left breast in female, estrogen receptor negative ([place]) Staging form: Breast, AJCC 8th Edition - Clinical stage from 06/10/2017: Stage IIIB (cT2, cN1, cM0, G3, ER: Negative, PR: Negative, HER2: Negative) - Signed by Truitt Merle, MD on 06/21/2017       Malignant neoplasm of upper-inner quadrant of left breast in female, estrogen receptor negative ([place])   06/08/2017 Imaging    DIAGNOSTIC MAMMO and [place] Left Breast 06/08/17 IMPRESSION:  Highly suspicious palpable left breast 11 o'clock mass which measures 2.8 cm in greatest dimension. Ductal extension anteriorly to the level of the nipple, and posteriorly for additional 1.8cm, is suspicious for tumor extension.       06/10/2017 Initial Diagnosis    Malignant neoplasm of upper-inner quadrant of left breast in female, estrogen receptor negative ([place])      06/10/2017 Pathology Results    Diagnosis 06/10/17 1.) Breast, left, needle core biopsy -INVASIVE DUCTAL CARCINOMA, GRADE 3, WITH FOCAL HIGH GRADE DUCTAL CARCINOMA IN SITU -NEOPLASM INVOLVES ALL CORES AND MEASURES APPROXIMATELY 1.5CM IN MAXIMAL LINEAR DIMENSION  2.) Lymph node, needle/core biopsy, left axilla -METASTATIC CARCINOMA CONSISTENT WITH BREAST PRIMARY       06/10/2017 Receptors her2    ER: -, PR-, HER2 not amplified       06/23/2017 Echocardiogram    ECHO 06/23/17 Study Conclusions - Left ventricle: The cavity size was normal. Wall thickness was   increased in a pattern of  moderate LVH. Systolic function was   normal. The estimated ejection fraction was in the range of 60%   to 65%. Wall motion was normal; there were no regional wall   motion abnormalities. Left ventricular diastolic function   parameters were normal. - Mitral valve: Calcified annulus. Mildly thickened leaflets . - Left atrium: The atrium was mildly dilated. - Atrial septum: There was increased thickness of the septum,   consistent with lipomatous hypertrophy. No defect or patent   foramen ovale was identified. - Impressions: Normal GLS - 18.6 Impressions: - Normal GLS - 18.6      06/28/2017 Imaging    CT CAP W Contrast 06/28/17 IMPRESSION: 1. Mildly enlarged left axillary lymph node at 1.5 cm, a significant change from the prior chest CT from 2015, suspicious for left axillary metastatic disease. 2. Left upper breast lesion 2.1 cm in diameter with higher density margins and lower density center, favoring lumpectomy site. 3. Mildly enlarged right hilar lymph node, but stable compared back through 2015. 4. No findings of osseous metastatic disease or additional metastatic sites. 5. Other imaging findings of potential clinical significance: Aberrant right subclavian artery passes behind the esophagus. Aortic Atherosclerosis (ICD10-I70.0) and Emphysema (ICD10-J43.9). Coronary atherosclerosis. Mild nodular enlargement of the left inferior thyroid lobe, isodense to the rest of the thyroid. Airway thickening is present, suggesting bronchitis or reactive airways disease. Left hepatic lobe cyst. Left renal cyst. Likely small cysts in the right kidney. Sigmoid colon diverticulosis.       06/28/2017 Imaging    Bone Scan 06/28/17 IMPRESSION: No evidence of osseous metastatic disease. Subtle increased radiotracer  uptake within the left chest wall, likely within the left breast.      06/30/2017 Surgery    Port placement by Dr. [name] on 06/30/17      07/01/2017 -  Chemotherapy     Neoadjuvant Adriamycin and Cytoxan (AC) every 2 weeks for 4 cycles starting 07/01/17. Dose reduced starting with cycle 2. Compelted AC on 12.19.18. Started weekly Carbo and Taxol every on 08/31/17        07/23/2017 - 07/26/2017 Hospital Admission    Admit date: 07/23/17 Admission diagnosis: 07/26/17 Additional comments: febrile neutropenia; diverticulitis      08/29/2017 Mammogram    Diagnostic Mammogram and [place] 08/29/17  IMPRESSION:  Interval decrease in the size of known left 11 o'clock malignancy, with greatest dimension of 1.3cm.  Interval decrease in size of known left malignant axillary lymph node, with cortical thickness of 3 mm.        09/09/2017 Genetic Testing    The patient had genetic testing due to a personal history of breast cancer and skin cancer,  and a family history of breast cancer, pancreatic cancer, melanoma, and colon cancer.  The Common Hereditary Cancer Panel + Melanoma Panel + Basal Cell Nevus Syndrome Panel was ordered (58 genes).  The following genes were evaluated for sequence changes and exonic deletions/duplications: APC, ATM, AXIN2, BAP1, BARD1, BMPR1A, BRCA1, BRCA2, BRIP1, CDH1, CDK4, CDKN2A (p14ARF), CDKN2A (p16INK4a), CHEK2, CTNNA1, DICER1, EPCAM*, FANCC, GREM1*, KIT, MC1R, MEN1, MLH1, MSH2, MSH3, MSH6, MUTYH, NBN, NF1, PALB2, PALLD, PDGFRA, PMS2, POLD1, POLE, POT1, PTCH1, PTCH2, PTEN, RAD50, RAD51C, RAD51D, RB1, SDHB, SDHC, SDHD, SMAD4, SMARCA4, STK11, SUFU, TERT, TP53, TSC1, TSC2, VHL. The following genes were evaluated for sequence changes only:HOXB13*, MITF*, NTHL1*, SDHA  Results: Negative, no pathogenic variants identified. The date of this test report is 09/09/2017.       HISTORY OF PRESENTING ILLNESS:  Lindsay [name] 72 y.o. female is here because of left breast cancer.   Initially, the patient palpated a lump within the retroareolar region of the left breast at the end of July. She also had noticed some left nipple inversion, soreness, and hardness  around the nipple for several months prior. In the past three months she reports that her soreness had improved originally, but since then she has not noticed any changes or worsening. She denies any abdominal pain, back pain, cough, chest pain, loss of appetite, weight loss, or any other associated symptoms in this time period.   She subsequently underwent mammogram and US of the left breast including the axilla which was notable for a suspicious mass within the 11 o'clock region of the left breast measuring at 2.8cm in the greatest dimension. Following this, she underwent stereotactic needle-core biopsy showing her to have invasive ductal carcinoma, histologic analysis demonstrating triple negative breast cancer, Ki67 20%. Additionally, an enlarged lymph node was surveyed within the left axilla which was positive for metastatic carcinoma.   On 06/17/17, she met with Dr [name] of [facility]. Given her triple negative cancer and the invasive nature of her disease, he recommended mastectomy with SLN dissection with port placement. She was subsequently referred into medical oncology for ongoing management of her disease to consider beginning surgery vs chemotherapy as the first round of treatment.   She reports today with her husband and sister. Overall she has been doing well and without notable acute complaints. Her breast soreness has resolved since initial onset. She has no other symptoms of concern to her. Of note, both of her  sisters have also been diagnosed with breast cancer and she has an extensive family history of other cancers, including her mother who had pancreatic cancer.   GYN HISTORY  Menarchal: 72 years of age LMP: 21 years ago Contraceptive: Yes in her youth HRT: none GP: G2P2, she was 72yo at the age of first delivery. She did not breast feed.   She did previously smoke for approximately 50 years. She quit three years ago on August 4th, 2015. On average, she smoked  2ppd. She does not drink any alcoholic beverages and does not partake in illicit drugs.    CURRENT THERAPY:  Neoadjuvant Adriamycin and Cytoxan (AC) every 2 weeks for 4 cycles starting 07/01/17, reduced 10% due to poor toleration starting with cycle 2. Completed on 08/17/17. Start Carbo and taxol weekly for 12 cycles 08/31/17. Taxol changed to abraxane on 1/23 due to suspected taxol drug rash, carboplatin decreased with cycle 4 for mild thrombocytopenia. Due to cytopenia, reduced Carbo to 1.0 AUC starting with cycle 10    INTERVAL HISTORY:  Lindsay [name] is here for a follow up and cycle 10 of Abraxane and Carbo. She presents to the clinic today accompanied by her family member. She notes she will feel bad with no energy for 2-3 days after infusion then will recover.   On review of symptoms, pt notes tingling in her fingers and toes and her fingernails will turn purple at time. She still has adequate function and dexterity. This started since adding abraxane in 08/2017.     MEDICAL HISTORY:  Past Medical History:  Diagnosis Date  . Breast cancer ([place])   . Cancer [facility])    left breast cancer  . Dysrhythmia    PACs and nonsustained runs of atrial tach by Holter 10/13/16  . Family history of breast cancer   . Family history of colon cancer   . Family history of melanoma   . Family history of pancreatic cancer   . History of kidney stones   . Hypertension   . Personal history of colonic polyps   . Personal history of skin cancer     SURGICAL HISTORY: Past Surgical History:  Procedure Laterality Date  . COLONOSCOPY    . FOOT SURGERY Right   . IR CV LINE INJECTION  08/02/2017  . left breast lumpectomy  2000  . PORTACATH PLACEMENT Right 06/30/2017   Procedure: [place];  Surgeon: Coralie Keens, MD;  Location: [facility];  Service: General;  Laterality: Right;    SOCIAL HISTORY: Social History   Socioeconomic History  . Marital status: Married    Spouse name:  Not on file  . Number of children: Not on file  . Years of education: Not on file  . Highest education level: Not on file  Social Needs  . Financial resource strain: Not on file  . Food insecurity - worry: Not on file  . Food insecurity - inability: Not on file  . Transportation needs - medical: Not on file  . Transportation needs - non-medical: Not on file  Occupational History  . Not on file  Tobacco Use  . Smoking status: Former Smoker    Packs/day: 2.00    Years: 50.00    Pack years: 100.00    Last attempt to quit: 06/02/2014    Years since quitting: 3.4  . Smokeless tobacco: Never Used  Substance and Sexual Activity  . Alcohol use: No  . Drug use: No  . Sexual activity: Yes  Other Topics Concern  . Not on file  Social History Narrative  . Not on file    FAMILY HISTORY: Family History  Problem Relation Age of Onset  . Pancreatic cancer Mother 82  . Breast cancer Sister 36       bilateral mastectomy  . Basal cell carcinoma Sister        'many'  . Breast cancer Paternal Aunt   . Breast cancer Sister 94  . Melanoma Sister   . Colon cancer Sister 67    ALLERGIES:  has No Known Allergies.  MEDICATIONS:  Current Outpatient Medications  Medication Sig Dispense Refill  . acebutolol (SECTRAL) 200 MG capsule Take 200 mg by mouth 2 (two) times daily.    Marland Kitchen aspirin EC 81 MG tablet Take 81 mg by mouth daily.    Marland Kitchen atorvastatin (LIPITOR) 10 MG tablet Take 10 mg by mouth at bedtime.    . calcium carbonate (OS-CAL) 600 MG TABS tablet Take 1,200 mg by mouth daily.    . Cholecalciferol (VITAMIN D3) 2000 units TABS Take 2,000 Units by mouth daily.     . cloNIDine (CATAPRES) 0.1 MG tablet Take 0.1 mg by mouth 2 (two) times daily.    . DOCOSAHEXAENOIC ACID PO Take 1,200 mg by mouth 2 (two) times daily.    Marland Kitchen escitalopram (LEXAPRO) 5 MG tablet Take 5 mg by mouth daily.    . hydrALAZINE (APRESOLINE) 25 MG tablet Take 12.5 mg by mouth 3 (three) times daily.    Marland Kitchen lidocaine-prilocaine  (EMLA) cream Apply 1 application topically as needed. 30 g 2  . loratadine (CLARITIN) 10 MG tablet Take 10 mg by mouth daily.    Marland Kitchen LORazepam (ATIVAN) 1 MG tablet Take 1 tablet (1 mg total) by mouth once as needed for anxiety. Take 1 tab 1 hour before MRI scan 2 tablet 0  . magnesium oxide (MAG-OX) 400 MG tablet Take 400 mg by mouth daily.    . Multiple Vitamins-Minerals (EYE VITAMINS PO) Take 1 capsule by mouth 2 (two) times daily.    Marland Kitchen olmesartan-hydrochlorothiazide (BENICAR HCT) 40-25 MG tablet Take 1 tablet by mouth daily.  1  . ondansetron (ZOFRAN) 8 MG tablet Take 1 tablet (8 mg total) by mouth every 8 (eight) hours as needed for nausea or vomiting. 20 tablet 2  . prochlorperazine (COMPAZINE) 10 MG tablet Take 1 tablet (10 mg total) by mouth every 6 (six) hours as needed for nausea or vomiting. 30 tablet 2  . vitamin B-12 (CYANOCOBALAMIN) 1000 MCG tablet Take 1 tablet by mouth daily.     No current facility-administered medications for this visit.    Facility-Administered Medications Ordered in Other Visits  Medication Dose Route Frequency Provider Last Rate Last Dose  . sodium chloride flush (NS) 0.9 % injection 10 mL  10 mL Intravenous PRN Truitt Merle, MD   10 mL at 07/29/17 1310    REVIEW OF SYSTEMS:   Constitutional: Denies fevers,  abnormal night sweats Eyes: Denies blurriness of vision, double vision or watery eyes Ears, nose, mouth, throat, and face: Denies mucositis or sore throat   Respiratory: Denies cough, wheezes   Cardiovascular: Denies palpitation, chest discomfort or lower extremity swelling Gastrointestinal:  Denies nausea, heartburn or change in bowel habits Skin: Denies abnormal skin rashes (+) finger nails occasionally turn purple from chemo Lymphatics: Denies new lymphadenopathy or easy bruising Neurological:Denies numbness, new weaknesses (+) tingling in fingers and toes persists Behavioral/Psych: Mood is stable, no new changes  All other systems  were reviewed with  the patient and are negative.  PHYSICAL EXAMINATION: ECOG PERFORMANCE STATUS: 1-2 Vitals:   11/02/17 1006  BP: (!) 147/59  Pulse: 63  Resp: 19  Temp: 97.9 F (36.6 C)  TempSrc: Oral  SpO2: 100%  Weight: 250 lb (113.4 kg)  Height: 5' 7" (1.702 m)    GENERAL:alert, no distress and comfortable SKIN: skin color, texture, turgor are normal, no rashes or significant lesions EYES: normal, conjunctiva are pink and non-injected, sclera clear OROPHARYNX:no exudate, no erythema and lips, buccal mucosa, and tongue normal  NECK: supple, thyroid normal size, non-tender, without nodularity LYMPH:  no palpable lymphadenopathy in the cervical, axillary or inguinal LUNGS: clear to auscultation and percussion with normal breathing effort HEART: regular rate & rhythm and no murmurs and no lower extremity edema ABDOMEN:abdomen soft, non-tender and normal bowel sounds Musculoskeletal:no cyanosis of digits and no clubbing  PSYCH: alert & oriented x 3 with fluent speech NEURO: no focal motor/sensory deficits BREAST: deferred today    LABORATORY DATA:  I have reviewed the data as listed CBC Latest Ref Rng & Units 11/02/2017 10/26/2017 10/19/2017  WBC 3.9 - 10.3 K/uL 3.9 3.9 1.7(L)  Hemoglobin 11.6 - 15.9 g/dL 8.7(L) 9.4(L) 7.5(L)  Hematocrit 34.8 - 46.6 % 26.2(L) 27.5(L) 22.6(L)  Platelets 145 - 400 K/uL 121(L) 165 186   CMP Latest Ref Rng & Units 11/02/2017 10/26/2017 10/19/2017  Glucose 70 - 140 mg/dL 104 119 104  BUN 7 - 26 mg/dL 27(H) 17 23  Creatinine 0.60 - 1.10 mg/dL 1.17(H) 1.00 1.02  Sodium 136 - 145 mmol/L 139 140 139  Potassium 3.5 - 5.1 mmol/L 4.3 3.9 4.4  Chloride 98 - 109 mmol/L 103 105 105  CO2 22 - 29 mmol/L _0 Calcium 8.4 - 10.4 mg/dL 9.0 9.4 8.8  Total Protein 6.4 - 8.3 g/dL 5.9(L) 5.9(L) 5.8(L)  Total Bilirubin 0.2 - 1.2 mg/dL 0.6 0.5 0.5  Alkaline Phos 40 - 150 U/L 57 56 47  AST 5 - 34 U/L _1 ALT 0 - 55 U/L _2 PATHOLOGY:  Diagnosis 06/10/17 1.)  Breast, left, needle core biopsy -INVASIVE DUCTAL CARCINOMA, GRADE 3, WITH FOCAL HIGH GRADE DUCTAL CARCINOMA IN SITU -NEOPLASM INVOLVES ALL CORES AND MEASURES APPROXIMATELY 1.5CM IN MAXIMAL LINEAR DIMENSION -A BREAST PROGNOSTIC PROFILE WILL BE ORDERED ON BLOCK 1A AND SEPARATELY REPORTED   2.) Lymph node, needle/core biopsy, left axilla -METASTATIC CARCINOMA CONSISTENT WITH BREAST PRIMARY    PROCEDURES  ECHO 06/23/17 Study Conclusions - Left ventricle: The cavity size was normal. Wall thickness was   increased in a pattern of moderate LVH. Systolic function was   normal. The estimated ejection fraction was in the range of 60%   to 65%. Wall motion was normal; there were no regional wall   motion abnormalities. Left ventricular diastolic function   parameters were normal. - Mitral valve: Calcified annulus. Mildly thickened leaflets . - Left atrium: The atrium was mildly dilated. - Atrial septum: There was increased thickness of the septum,   consistent with lipomatous hypertrophy. No defect or patent   foramen ovale was identified. - Impressions: Normal GLS - 18.6 Impressions: - Normal GLS - 18.6   RADIOGRAPHIC STUDIES: I have personally reviewed the radiological images as listed and agreed with the findings in the report.  Diagnostic Mammogram and [place] 08/29/17  IMPRESSION:  Interval decrease in the size of known left 11 o'clock malignancy, with greatest dimension of 1.3cm.  Interval decrease  in size of known left malignant axillary lymph node, with cortical thickness of 3 mm.    No results found.   [place] Left Breast 06/08/17 IMPRESSION:  Highly suspicious palpable left breast 11 o'clock mass which measures 2.8 cm in greatest dimension. Ductal extension anteriorly to the level of the nipple, and posteriorly for additional 1.8cm, is suspicious for tumor extension.   ASSESSMENT: Lindsay [name] is a wonderful 72 y.o. female who presents today to discuss the ongoing management of  her left breast cancer.   1. 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  -We discussed her mammogram, ultrasound findings, and initial biopsy results with patient and her family members in great detail.  -She previously presented with a palpable left breast mass, measures 2.8 cm on ultrasound, with left axillary lymph node involvement (not palpable on exam). Breast tumor and node biopsy showed triple negative breast ductal carcinoma. -She was seen by surgeon Dr. [name] who recommended left mastectomy with ALND and port placement.  -We discussed her risk of cancer recurrence after complete surgical resection. Due to the relatively large size of her primary tumor, positive lymph nodes, and aggressive nature of her triple negative breast cancer, she is at very high risk of recurrence. -Due to her positive family history of breast cancer, we recommended her to see genetics, to rule out inheritable breast cancer syndrome.   -Her 06/28/17 CT CAP and bone scan shows no evidence of metastasis. MRI was not done due to logistics, will be rescheduled at [place]  -She started neoadjuvant AC with onpro on 07/01/17, she experienced significant fatigue, anorexia and some diarrhea and constipation and become febrile once.  She has lost 5 pounds in the past 2 weeks. I reduced her cycle 2 dose by 10%. Due to port dysfunction, cycle 3 was post poned 1 week. She completed 4 cycles AC on 08/07/17  --Since her breast mass was not palpable on exam, I have ordered a interim ultrasound and mammogram that was done on 08/29/2017, which showed significant reduction of the breast mass, from 2.8 cm to 1.3 cm, and decreased adenopathy in the left axilla.  She has had a good response to chemo. -Given the locally advanced triple negative disease, I recommend her to continue chemo with weekly carboplatin and Taxol  for a total of 12 weeks. The benefits and side effects of carboplatin in addition to Taxol was discussed with her, especially the risk of neutropenic fever, anemia needing blood transfusion, etc. She voiced good understanding and agrees to proceed.   -She started weekly Carbo and taxol on 08/31/17 and experienced low HG that required a blood transfusion.  -I previously discussed with her that if her counts continue to trend low or drop further that we can hold Carbo and give supplemental Granix injection.  -Pt developed worsening skin rash, especially on her arms and upper chest. This is likely allergy reaction to Taxol. Changed her Taxol to Abraxane from cycle 4. She also developed mild thrombocytopenia, I will reduce her carbo dose to AUC 1.5. She otherwise is tolerating chemotherapy well, will continue new adjuvant treatment.  -Cycle 5 was omitted for platelet count 60K and Carbo further reduced from cycle 6. Pt now able to tolerate treatment from cycle 6 on.  -She has developed tingling in her fingers and toes and has persisted over the past 6 weeks.  Overall stable.  Due to her significant  anemia, I will reduce Carbo to 1.0 AUC starting with cycle 10 and will proceed with Abraxane this cycle, may need to reduce her Abraxane for next 2 cycles if neuropathy gets worse. -Labs reviewed, hg at 8.7, plt 121K, slight elevation in kidney function test. Overall adequate to proceed with cycle 10 today.  -Repeat breast [facility] and [place] [facility] after last cycle chemo.  -F/u in 2 weeks    2. AFIB and HTN -She is currently followed by a cardiologist for ongoing management of this. Not currently on formal anticoagulation therapy.  -She was on 33m ASA daily, but she reports that she stopped this in anticipation for her surgery. I encouraged her to start taking this again until her surgeon tell her to stop taking this.  -Baseline Echo adequate to start chemo -She is on clonidine, hydralazine, and  olmesartan-HCTZ. She has had dizziness with hypotension in the past, she occasionally holds BP meds if she feels dizzy. Will monitor frequently today during chemo and blood products.    3. Genetics -I previously encouraged her to have genetic testing due to her strong family history of cancer. Pt was seen by a genetic counselor on 08/31/17 and submitted for testing.  -The Common Hereditary Cancer Panel + Melanoma Panel + Basal Cell Nevus Syndrome Panel was Negative, no pathogenic variants identified. The date of this test report is 09/09/2017.    4. Diverticulitis requiring hospitalization and IV antibiotics  -Hospitalized 11/24-11/27 and treated with antibiotics Levaquin and or Flagyl -She acquired rash from her antibiotics and stopped the medication.  -Resolved now  5. Decreased appetite and weight loss  -Pt loss 20 lbs over a span of 6 weeks. She attributes this to her poor appetite.  -I encouraged her to eat as much as she can tolerate and to additionally begin eating a more high protein diet so that she can keep up her caloric intake.  -I also encouraged her to drink Boost supplements as she is able.  -Resolved; weight is stable  6. Skin rash - face, chest, arms, back, likely related to Taxol  -Suggested she apply topical hydrocortisone to affected areas and take po benadryl at night for itching as needed. -Taxol was switched to abraxane starting with cycle 4 -With no improvement on hydrocortisone, she was previously prescribed medol dose pak. She is to continue benadryl for itching PRN -She completed medrol dose pack on 2/6 -itching is resolved, rash is improving overall but not resolved   7. Chemotherapy induced anemia, thrombocytopenia  -reduced [place] cycle 4 -She has had symptomatic anemia requiring blood transfusion on chemotherapy -Anemia is stable Hgb decreased to 8.7 today; she does not require transfusion at this time -Cycle 5 was omitted for platelet count 60K,  carboplatin had been previously dose reduced; plt recovered to 109K today, no active bleeding. Will proceed with further dose reduction and monitor closely. -plt at 121K today   8.  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 -Reduce chemo if needed  PLAN:  -Labs reviewed and adequate to proceed with cycle 10 today, will reduced carbo to AUC 1.0 -[place] and [place]  -Lab, flush, abraxane/carbo in 1 and 2 weeks   I spent a total of 40 minutes for her visit today, more than 50% of face-to-face counseling.  No orders of the defined types were placed in this encounter.  This document serves as a record of services personally performed by YTruitt Merle  MD. It was created on her behalf by Joslyn Devon, a trained medical scribe. The creation of this record is based on the scribe's personal observations and the provider's statements to them.    I have reviewed the above documentation for accuracy and completeness, and I agree with the above.    Truitt Merle, MD 11/03/2017

## 2017-11-02 ENCOUNTER — Inpatient Hospital Stay: Payer: Medicare Other

## 2017-11-02 ENCOUNTER — Inpatient Hospital Stay: Payer: Medicare Other | Attending: Hematology | Admitting: Hematology

## 2017-11-02 VITALS — BP 147/59 | HR 63 | Temp 97.9°F | Resp 19 | Ht 67.0 in | Wt 250.0 lb

## 2017-11-02 DIAGNOSIS — I4891 Unspecified atrial fibrillation: Secondary | ICD-10-CM | POA: Diagnosis not present

## 2017-11-02 DIAGNOSIS — D6481 Anemia due to antineoplastic chemotherapy: Secondary | ICD-10-CM | POA: Insufficient documentation

## 2017-11-02 DIAGNOSIS — R21 Rash and other nonspecific skin eruption: Secondary | ICD-10-CM | POA: Diagnosis not present

## 2017-11-02 DIAGNOSIS — Z5189 Encounter for other specified aftercare: Secondary | ICD-10-CM | POA: Insufficient documentation

## 2017-11-02 DIAGNOSIS — C773 Secondary and unspecified malignant neoplasm of axilla and upper limb lymph nodes: Secondary | ICD-10-CM | POA: Diagnosis not present

## 2017-11-02 DIAGNOSIS — R630 Anorexia: Secondary | ICD-10-CM

## 2017-11-02 DIAGNOSIS — Z171 Estrogen receptor negative status [ER-]: Principal | ICD-10-CM

## 2017-11-02 DIAGNOSIS — C50212 Malignant neoplasm of upper-inner quadrant of left female breast: Secondary | ICD-10-CM | POA: Diagnosis not present

## 2017-11-02 DIAGNOSIS — D6959 Other secondary thrombocytopenia: Secondary | ICD-10-CM

## 2017-11-02 DIAGNOSIS — G620 Drug-induced polyneuropathy: Secondary | ICD-10-CM | POA: Insufficient documentation

## 2017-11-02 DIAGNOSIS — T451X5A Adverse effect of antineoplastic and immunosuppressive drugs, initial encounter: Secondary | ICD-10-CM | POA: Diagnosis not present

## 2017-11-02 DIAGNOSIS — I10 Essential (primary) hypertension: Secondary | ICD-10-CM

## 2017-11-02 DIAGNOSIS — Z95828 Presence of other vascular implants and grafts: Secondary | ICD-10-CM

## 2017-11-02 DIAGNOSIS — R634 Abnormal weight loss: Secondary | ICD-10-CM | POA: Diagnosis not present

## 2017-11-02 DIAGNOSIS — Z5111 Encounter for antineoplastic chemotherapy: Secondary | ICD-10-CM | POA: Insufficient documentation

## 2017-11-02 LAB — CBC WITH DIFFERENTIAL/PLATELET
BASOS ABS: 0 10*3/uL (ref 0.0–0.1)
Basophils Relative: 1 %
EOS ABS: 0 10*3/uL (ref 0.0–0.5)
Eosinophils Relative: 0 %
HCT: 26.2 % — ABNORMAL LOW (ref 34.8–46.6)
HEMOGLOBIN: 8.7 g/dL — AB (ref 11.6–15.9)
LYMPHS ABS: 1 10*3/uL (ref 0.9–3.3)
Lymphocytes Relative: 26 %
MCH: 32 pg (ref 25.1–34.0)
MCHC: 33.2 g/dL (ref 31.5–36.0)
MCV: 96.3 fL (ref 79.5–101.0)
Monocytes Absolute: 0.5 10*3/uL (ref 0.1–0.9)
Monocytes Relative: 12 %
NEUTROS PCT: 61 %
Neutro Abs: 2.4 10*3/uL (ref 1.5–6.5)
Platelets: 121 10*3/uL — ABNORMAL LOW (ref 145–400)
RBC: 2.72 MIL/uL — AB (ref 3.70–5.45)
RDW: 20 % — ABNORMAL HIGH (ref 11.2–14.5)
WBC: 3.9 10*3/uL (ref 3.9–10.3)

## 2017-11-02 LAB — COMPREHENSIVE METABOLIC PANEL
ALT: 19 U/L (ref 0–55)
AST: 22 U/L (ref 5–34)
Albumin: 3.5 g/dL (ref 3.5–5.0)
Alkaline Phosphatase: 57 U/L (ref 40–150)
Anion gap: 8 (ref 3–11)
BUN: 27 mg/dL — ABNORMAL HIGH (ref 7–26)
CHLORIDE: 103 mmol/L (ref 98–109)
CO2: 28 mmol/L (ref 22–29)
CREATININE: 1.17 mg/dL — AB (ref 0.60–1.10)
Calcium: 9 mg/dL (ref 8.4–10.4)
GFR, EST AFRICAN AMERICAN: 52 mL/min — AB (ref >60)
GFR, EST NON AFRICAN AMERICAN: 45 mL/min — AB (ref >60)
Glucose, Bld: 104 mg/dL (ref 70–140)
Potassium: 4.3 mmol/L (ref 3.5–5.1)
Sodium: 139 mmol/L (ref 136–145)
Total Bilirubin: 0.6 mg/dL (ref 0.2–1.2)
Total Protein: 5.9 g/dL — ABNORMAL LOW (ref 6.4–8.3)

## 2017-11-02 MED ORDER — PALONOSETRON HCL INJECTION 0.25 MG/5ML
INTRAVENOUS | Status: AC
  Filled 2017-11-02: qty 5

## 2017-11-02 MED ORDER — DEXAMETHASONE SODIUM PHOSPHATE 10 MG/ML IJ SOLN
10.0000 mg | Freq: Once | INTRAMUSCULAR | Status: AC
  Administered 2017-11-02: 10 mg via INTRAVENOUS

## 2017-11-02 MED ORDER — SODIUM CHLORIDE 0.9 % IV SOLN
110.0000 mg | Freq: Once | INTRAVENOUS | Status: AC
  Administered 2017-11-02: 110 mg via INTRAVENOUS
  Filled 2017-11-02: qty 11

## 2017-11-02 MED ORDER — SODIUM CHLORIDE 0.9% FLUSH
10.0000 mL | INTRAVENOUS | Status: DC | PRN
  Administered 2017-11-02: 10 mL
  Filled 2017-11-02: qty 10

## 2017-11-02 MED ORDER — SODIUM CHLORIDE 0.9 % IV SOLN
Freq: Once | INTRAVENOUS | Status: AC
  Administered 2017-11-02: 11:00:00 via INTRAVENOUS

## 2017-11-02 MED ORDER — HEPARIN SOD (PORK) LOCK FLUSH 100 UNIT/ML IV SOLN
500.0000 [IU] | Freq: Once | INTRAVENOUS | Status: AC | PRN
  Administered 2017-11-02: 500 [IU]
  Filled 2017-11-02: qty 5

## 2017-11-02 MED ORDER — PACLITAXEL PROTEIN-BOUND CHEMO INJECTION 100 MG
80.0000 mg/m2 | Freq: Once | INTRAVENOUS | Status: AC
  Administered 2017-11-02: 200 mg via INTRAVENOUS
  Filled 2017-11-02: qty 40

## 2017-11-02 MED ORDER — PALONOSETRON HCL INJECTION 0.25 MG/5ML
0.2500 mg | Freq: Once | INTRAVENOUS | Status: AC
  Administered 2017-11-02: 0.25 mg via INTRAVENOUS

## 2017-11-02 MED ORDER — DEXAMETHASONE SODIUM PHOSPHATE 10 MG/ML IJ SOLN
INTRAMUSCULAR | Status: AC
  Filled 2017-11-02: qty 1

## 2017-11-02 MED ORDER — SODIUM CHLORIDE 0.9% FLUSH
10.0000 mL | INTRAVENOUS | Status: DC | PRN
  Administered 2017-11-02: 10 mL via INTRAVENOUS
  Filled 2017-11-02: qty 10

## 2017-11-02 NOTE — Patient Instructions (Signed)
[facility] Discharge Instructions for Patients Receiving Chemotherapy  Today you received the following chemotherapy agents Abraxane,carboplatin  To help prevent nausea and vomiting after your treatment, we encourage you to take your nausea medication as directed  If you develop nausea and vomiting that is not controlled by your nausea medication, call the clinic.   BELOW ARE SYMPTOMS THAT SHOULD BE REPORTED IMMEDIATELY:  *FEVER GREATER THAN 100.5 F  *CHILLS WITH OR WITHOUT FEVER  NAUSEA AND VOMITING THAT IS NOT CONTROLLED WITH YOUR NAUSEA MEDICATION  *UNUSUAL SHORTNESS OF BREATH  *UNUSUAL BRUISING OR BLEEDING  TENDERNESS IN MOUTH AND THROAT WITH OR WITHOUT PRESENCE OF ULCERS  *URINARY PROBLEMS  *BOWEL PROBLEMS  UNUSUAL RASH Items with * indicate a potential emergency and should be followed up as soon as possible.  Feel free to call the clinic should you have any questions or concerns. The clinic phone number is [phone].  Please show the [facility] at check-in to the Emergency Department and triage nurse.

## 2017-11-03 ENCOUNTER — Inpatient Hospital Stay: Payer: Medicare Other

## 2017-11-03 ENCOUNTER — Encounter: Payer: Self-pay | Admitting: Hematology

## 2017-11-03 VITALS — BP 157/70 | HR 87 | Temp 98.3°F | Resp 20

## 2017-11-03 DIAGNOSIS — T451X5A Adverse effect of antineoplastic and immunosuppressive drugs, initial encounter: Secondary | ICD-10-CM

## 2017-11-03 DIAGNOSIS — G620 Drug-induced polyneuropathy: Secondary | ICD-10-CM | POA: Diagnosis not present

## 2017-11-03 DIAGNOSIS — Z5189 Encounter for other specified aftercare: Secondary | ICD-10-CM | POA: Diagnosis not present

## 2017-11-03 DIAGNOSIS — Z171 Estrogen receptor negative status [ER-]: Secondary | ICD-10-CM | POA: Diagnosis not present

## 2017-11-03 DIAGNOSIS — D6959 Other secondary thrombocytopenia: Secondary | ICD-10-CM | POA: Diagnosis not present

## 2017-11-03 DIAGNOSIS — Z5111 Encounter for antineoplastic chemotherapy: Secondary | ICD-10-CM | POA: Diagnosis not present

## 2017-11-03 DIAGNOSIS — C50212 Malignant neoplasm of upper-inner quadrant of left female breast: Secondary | ICD-10-CM

## 2017-11-03 DIAGNOSIS — C773 Secondary and unspecified malignant neoplasm of axilla and upper limb lymph nodes: Secondary | ICD-10-CM | POA: Diagnosis not present

## 2017-11-03 DIAGNOSIS — D6481 Anemia due to antineoplastic chemotherapy: Secondary | ICD-10-CM | POA: Insufficient documentation

## 2017-11-03 DIAGNOSIS — Z95828 Presence of other vascular implants and grafts: Secondary | ICD-10-CM

## 2017-11-03 HISTORY — DX: Anemia due to antineoplastic chemotherapy: D64.81

## 2017-11-03 HISTORY — DX: Adverse effect of antineoplastic and immunosuppressive drugs, initial encounter: T45.1X5A

## 2017-11-03 MED ORDER — TBO-FILGRASTIM 480 MCG/0.8ML ~~LOC~~ SOSY
PREFILLED_SYRINGE | SUBCUTANEOUS | Status: AC
  Filled 2017-11-03: qty 0.8

## 2017-11-03 MED ORDER — TBO-FILGRASTIM 480 MCG/0.8ML ~~LOC~~ SOSY
480.0000 ug | PREFILLED_SYRINGE | Freq: Once | SUBCUTANEOUS | Status: AC
  Administered 2017-11-03: 480 ug via SUBCUTANEOUS

## 2017-11-03 NOTE — Patient Instructions (Signed)
Tbo-Filgrastim injection What is this medicine? TBO-FILGRASTIM (T B O fil GRA stim) is a granulocyte colony-stimulating factor that stimulates the growth of neutrophils, a type of white blood cell important in the body's fight against infection. It is used to reduce the incidence of fever and infection in patients with certain types of cancer who are receiving chemotherapy that affects the bone marrow. This medicine may be used for other purposes; ask your health care provider or pharmacist if you have questions. COMMON BRAND NAME(S): Granix What should I tell my health care provider before I take this medicine? They need to know if you have any of these conditions: -bone scan or tests planned -kidney disease -sickle cell anemia -an unusual or allergic reaction to tbo-filgrastim, filgrastim, pegfilgrastim, other medicines, foods, dyes, or preservatives -pregnant or trying to get pregnant -breast-feeding How should I use this medicine? This medicine is for injection under the skin. If you get this medicine at home, you will be taught how to prepare and give this medicine. Refer to the Instructions for Use that come with your medication packaging. Use exactly as directed. Take your medicine at regular intervals. Do not take your medicine more often than directed. It is important that you put your used needles and syringes in a special sharps container. Do not put them in a trash can. If you do not have a sharps container, call your pharmacist or healthcare provider to get one. Talk to your pediatrician regarding the use of this medicine in children. Special care may be needed. Overdosage: If you think you have taken too much of this medicine contact a poison control center or emergency room at once. NOTE: This medicine is only for you. Do not share this medicine with others. What if I miss a dose? It is important not to miss your dose. Call your doctor or health care professional if you miss a  dose. What may interact with this medicine? This medicine may interact with the following medications: -medicines that may cause a release of neutrophils, such as lithium This list may not describe all possible interactions. Give your health care provider a list of all the medicines, herbs, non-prescription drugs, or dietary supplements you use. Also tell them if you smoke, drink alcohol, or use illegal drugs. Some items may interact with your medicine. What should I watch for while using this medicine? You may need blood work done while you are taking this medicine. What side effects may I notice from receiving this medicine? Side effects that you should report to your doctor or health care professional as soon as possible: -allergic reactions like skin rash, itching or hives, swelling of the face, lips, or tongue -blood in the urine -dark urine -dizziness -fast heartbeat -feeling faint -shortness of breath or breathing problems -signs and symptoms of infection like fever or chills; cough; or sore throat -signs and symptoms of kidney injury like trouble passing urine or change in the amount of urine -stomach or side pain, or pain at the shoulder -sweating -swelling of the legs, ankles, or abdomen -tiredness Side effects that usually do not require medical attention (report to your doctor or health care professional if they continue or are bothersome): -bone pain -headache -muscle pain -vomiting This list may not describe all possible side effects. Call your doctor for medical advice about side effects. You may report side effects to FDA at 1-800-FDA-1088. Where should I keep my medicine? Keep out of the reach of children. Store in a refrigerator between   2 and 8 degrees C (36 and 46 degrees F). Keep in carton to protect from light. Throw away this medicine if it is left out of the refrigerator for more than 5 consecutive days. Throw away any unused medicine after the expiration  date. NOTE: This sheet is a summary. It may not cover all possible information. If you have questions about this medicine, talk to your doctor, pharmacist, or health care provider.  2018 Elsevier/Gold Standard (2015-10-06 19:07:04)  

## 2017-11-04 ENCOUNTER — Telehealth: Payer: Self-pay | Admitting: Hematology

## 2017-11-04 NOTE — Telephone Encounter (Signed)
[facility] and scheduled patient for Mammogram/US for 11/28/17.  I also called and gave patient Date/Time/Location per 3/7 sch msg

## 2017-11-09 ENCOUNTER — Inpatient Hospital Stay: Payer: Medicare Other

## 2017-11-09 ENCOUNTER — Other Ambulatory Visit: Payer: Self-pay | Admitting: *Deleted

## 2017-11-09 ENCOUNTER — Telehealth: Payer: Self-pay

## 2017-11-09 ENCOUNTER — Other Ambulatory Visit: Payer: Medicare Other

## 2017-11-09 VITALS — BP 124/66 | HR 67 | Temp 98.5°F | Resp 19

## 2017-11-09 DIAGNOSIS — C773 Secondary and unspecified malignant neoplasm of axilla and upper limb lymph nodes: Secondary | ICD-10-CM | POA: Diagnosis not present

## 2017-11-09 DIAGNOSIS — G620 Drug-induced polyneuropathy: Secondary | ICD-10-CM | POA: Diagnosis not present

## 2017-11-09 DIAGNOSIS — Z171 Estrogen receptor negative status [ER-]: Secondary | ICD-10-CM | POA: Diagnosis not present

## 2017-11-09 DIAGNOSIS — D6481 Anemia due to antineoplastic chemotherapy: Secondary | ICD-10-CM | POA: Diagnosis not present

## 2017-11-09 DIAGNOSIS — D6959 Other secondary thrombocytopenia: Secondary | ICD-10-CM | POA: Diagnosis not present

## 2017-11-09 DIAGNOSIS — T451X5A Adverse effect of antineoplastic and immunosuppressive drugs, initial encounter: Secondary | ICD-10-CM | POA: Diagnosis not present

## 2017-11-09 DIAGNOSIS — C50212 Malignant neoplasm of upper-inner quadrant of left female breast: Secondary | ICD-10-CM

## 2017-11-09 DIAGNOSIS — Z5189 Encounter for other specified aftercare: Secondary | ICD-10-CM | POA: Diagnosis not present

## 2017-11-09 DIAGNOSIS — Z5111 Encounter for antineoplastic chemotherapy: Secondary | ICD-10-CM | POA: Diagnosis not present

## 2017-11-09 LAB — CBC WITH DIFFERENTIAL/PLATELET
BASOS PCT: 1 %
Basophils Absolute: 0 10*3/uL (ref 0.0–0.1)
Eosinophils Absolute: 0 10*3/uL (ref 0.0–0.5)
Eosinophils Relative: 0 %
HEMATOCRIT: 24.8 % — AB (ref 34.8–46.6)
HEMOGLOBIN: 8.1 g/dL — AB (ref 11.6–15.9)
LYMPHS ABS: 1.3 10*3/uL (ref 0.9–3.3)
Lymphocytes Relative: 40 %
MCH: 32.4 pg (ref 25.1–34.0)
MCHC: 32.7 g/dL (ref 31.5–36.0)
MCV: 99.2 fL (ref 79.5–101.0)
MONO ABS: 0.5 10*3/uL (ref 0.1–0.9)
Monocytes Relative: 15 %
NEUTROS ABS: 1.5 10*3/uL (ref 1.5–6.5)
NEUTROS PCT: 44 %
Platelets: 94 10*3/uL — ABNORMAL LOW (ref 145–400)
RBC: 2.5 MIL/uL — ABNORMAL LOW (ref 3.70–5.45)
RDW: 18.2 % — AB (ref 11.2–14.5)
WBC: 3.3 10*3/uL — ABNORMAL LOW (ref 3.9–10.3)

## 2017-11-09 LAB — COMPREHENSIVE METABOLIC PANEL
ALBUMIN: 3.5 g/dL (ref 3.5–5.0)
ALK PHOS: 56 U/L (ref 40–150)
ALT: 16 U/L (ref 0–55)
ANION GAP: 6 (ref 3–11)
AST: 20 U/L (ref 5–34)
BILIRUBIN TOTAL: 0.6 mg/dL (ref 0.2–1.2)
BUN: 26 mg/dL (ref 7–26)
CALCIUM: 9.3 mg/dL (ref 8.4–10.4)
CO2: 29 mmol/L (ref 22–29)
Chloride: 103 mmol/L (ref 98–109)
Creatinine, Ser: 1.31 mg/dL — ABNORMAL HIGH (ref 0.60–1.10)
GFR, EST AFRICAN AMERICAN: 45 mL/min — AB (ref >60)
GFR, EST NON AFRICAN AMERICAN: 39 mL/min — AB (ref >60)
GLUCOSE: 111 mg/dL (ref 70–140)
POTASSIUM: 4.4 mmol/L (ref 3.5–5.1)
Sodium: 138 mmol/L (ref 136–145)
TOTAL PROTEIN: 5.8 g/dL — AB (ref 6.4–8.3)

## 2017-11-09 LAB — SAMPLE TO BLOOD BANK

## 2017-11-09 MED ORDER — SODIUM CHLORIDE 0.9% FLUSH
10.0000 mL | INTRAVENOUS | Status: DC | PRN
  Administered 2017-11-09: 10 mL
  Filled 2017-11-09: qty 10

## 2017-11-09 MED ORDER — PALONOSETRON HCL INJECTION 0.25 MG/5ML
INTRAVENOUS | Status: AC
  Filled 2017-11-09: qty 5

## 2017-11-09 MED ORDER — PACLITAXEL PROTEIN-BOUND CHEMO INJECTION 100 MG
70.0000 mg/m2 | Freq: Once | INTRAVENOUS | Status: AC
  Administered 2017-11-09: 175 mg via INTRAVENOUS
  Filled 2017-11-09: qty 35

## 2017-11-09 MED ORDER — HEPARIN SOD (PORK) LOCK FLUSH 100 UNIT/ML IV SOLN
500.0000 [IU] | Freq: Once | INTRAVENOUS | Status: AC | PRN
  Administered 2017-11-09: 500 [IU]
  Filled 2017-11-09: qty 5

## 2017-11-09 MED ORDER — SODIUM CHLORIDE 0.9 % IV SOLN
78.6400 mg | Freq: Once | INTRAVENOUS | Status: AC
  Administered 2017-11-09: 80 mg via INTRAVENOUS
  Filled 2017-11-09: qty 8

## 2017-11-09 MED ORDER — DEXAMETHASONE SODIUM PHOSPHATE 10 MG/ML IJ SOLN
INTRAMUSCULAR | Status: AC
  Filled 2017-11-09: qty 1

## 2017-11-09 MED ORDER — PALONOSETRON HCL INJECTION 0.25 MG/5ML
0.2500 mg | Freq: Once | INTRAVENOUS | Status: AC
  Administered 2017-11-09: 0.25 mg via INTRAVENOUS

## 2017-11-09 MED ORDER — SODIUM CHLORIDE 0.9 % IV SOLN
Freq: Once | INTRAVENOUS | Status: AC
  Administered 2017-11-09: 11:00:00 via INTRAVENOUS

## 2017-11-09 MED ORDER — DEXAMETHASONE SODIUM PHOSPHATE 10 MG/ML IJ SOLN
10.0000 mg | Freq: Once | INTRAMUSCULAR | Status: AC
  Administered 2017-11-09: 10 mg via INTRAVENOUS

## 2017-11-09 NOTE — Patient Instructions (Signed)
[facility] Discharge Instructions for Patients Receiving Chemotherapy  Today you received the following chemotherapy agents Abraxane,carboplatin  To help prevent nausea and vomiting after your treatment, we encourage you to take your nausea medication as directed  If you develop nausea and vomiting that is not controlled by your nausea medication, call the clinic.   BELOW ARE SYMPTOMS THAT SHOULD BE REPORTED IMMEDIATELY:  *FEVER GREATER THAN 100.5 F  *CHILLS WITH OR WITHOUT FEVER  NAUSEA AND VOMITING THAT IS NOT CONTROLLED WITH YOUR NAUSEA MEDICATION  *UNUSUAL SHORTNESS OF BREATH  *UNUSUAL BRUISING OR BLEEDING  TENDERNESS IN MOUTH AND THROAT WITH OR WITHOUT PRESENCE OF ULCERS  *URINARY PROBLEMS  *BOWEL PROBLEMS  UNUSUAL RASH Items with * indicate a potential emergency and should be followed up as soon as possible.  Feel free to call the clinic should you have any questions or concerns. The clinic phone number is [phone].  Please show the [place] at check-in to the Emergency Department and triage nurse.

## 2017-11-09 NOTE — Telephone Encounter (Signed)
Scheduled patient for blood and injection on Sat. Per 3/13 in Basket

## 2017-11-09 NOTE — Progress Notes (Signed)
Okay to treat with platelet of 94 per Dr. Burr Medico.

## 2017-11-09 NOTE — Addendum Note (Signed)
Addended by: Truitt Merle on: 11/09/2017 10:48 AM   Modules accepted: Orders

## 2017-11-10 ENCOUNTER — Ambulatory Visit: Payer: Medicare Other

## 2017-11-12 ENCOUNTER — Inpatient Hospital Stay: Payer: Medicare Other

## 2017-11-12 ENCOUNTER — Ambulatory Visit: Payer: Medicare Other

## 2017-11-12 DIAGNOSIS — Z171 Estrogen receptor negative status [ER-]: Secondary | ICD-10-CM | POA: Diagnosis not present

## 2017-11-12 DIAGNOSIS — T451X5A Adverse effect of antineoplastic and immunosuppressive drugs, initial encounter: Secondary | ICD-10-CM | POA: Diagnosis not present

## 2017-11-12 DIAGNOSIS — D702 Other drug-induced agranulocytosis: Secondary | ICD-10-CM

## 2017-11-12 DIAGNOSIS — D6959 Other secondary thrombocytopenia: Secondary | ICD-10-CM | POA: Diagnosis not present

## 2017-11-12 DIAGNOSIS — Z5111 Encounter for antineoplastic chemotherapy: Secondary | ICD-10-CM | POA: Diagnosis not present

## 2017-11-12 DIAGNOSIS — D6481 Anemia due to antineoplastic chemotherapy: Secondary | ICD-10-CM

## 2017-11-12 DIAGNOSIS — G620 Drug-induced polyneuropathy: Secondary | ICD-10-CM | POA: Diagnosis not present

## 2017-11-12 DIAGNOSIS — Z5189 Encounter for other specified aftercare: Secondary | ICD-10-CM | POA: Diagnosis not present

## 2017-11-12 DIAGNOSIS — C773 Secondary and unspecified malignant neoplasm of axilla and upper limb lymph nodes: Secondary | ICD-10-CM | POA: Diagnosis not present

## 2017-11-12 DIAGNOSIS — C50212 Malignant neoplasm of upper-inner quadrant of left female breast: Secondary | ICD-10-CM | POA: Diagnosis not present

## 2017-11-12 LAB — BPAM RBC
BLOOD PRODUCT EXPIRATION DATE: 201903312359
Unit Type and Rh: 6200

## 2017-11-12 LAB — TYPE AND SCREEN
ABO/RH(D): A POS
Antibody Screen: NEGATIVE
Unit division: 0

## 2017-11-12 MED ORDER — HEPARIN SOD (PORK) LOCK FLUSH 100 UNIT/ML IV SOLN
500.0000 [IU] | Freq: Every day | INTRAVENOUS | Status: AC | PRN
  Administered 2017-11-12: 500 [IU]
  Filled 2017-11-12: qty 5

## 2017-11-12 MED ORDER — SODIUM CHLORIDE 0.9% FLUSH
10.0000 mL | INTRAVENOUS | Status: AC | PRN
  Administered 2017-11-12: 10 mL
  Filled 2017-11-12: qty 10

## 2017-11-12 MED ORDER — TBO-FILGRASTIM 480 MCG/0.8ML ~~LOC~~ SOSY
480.0000 ug | PREFILLED_SYRINGE | Freq: Once | SUBCUTANEOUS | Status: AC
  Administered 2017-11-12: 480 ug via SUBCUTANEOUS

## 2017-11-12 MED ORDER — TBO-FILGRASTIM 480 MCG/0.8ML ~~LOC~~ SOSY
PREFILLED_SYRINGE | SUBCUTANEOUS | Status: AC
  Filled 2017-11-12: qty 0.8

## 2017-11-12 MED ORDER — SODIUM CHLORIDE 0.9 % IV SOLN
250.0000 mL | Freq: Once | INTRAVENOUS | Status: AC
  Administered 2017-11-12: 250 mL via INTRAVENOUS

## 2017-11-12 NOTE — Patient Instructions (Addendum)
Blood Transfusion, Adult A blood transfusion is a procedure in which you receive donated blood, including plasma, platelets, and red blood cells, through an IV tube. You may need a blood transfusion because of illness, surgery, or injury. The blood may come from a donor. You may also be able to donate blood for yourself (autologous blood donation) before a surgery if you know that you might require a blood transfusion. The blood given in a transfusion is made up of different types of cells. You may receive:  Red blood cells. These carry oxygen to the cells in the body.  White blood cells. These help you fight infections.  Platelets. These help your blood to clot.  Plasma. This is the liquid part of your blood and it helps with fluid imbalances.  If you have hemophilia or another clotting disorder, you may also receive other types of blood products. Tell a health care provider about:  Any allergies you have.  All medicines you are taking, including vitamins, herbs, eye drops, creams, and over-the-counter medicines.  Any problems you or family members have had with anesthetic medicines.  Any blood disorders you have.  Any surgeries you have had.  Any medical conditions you have, including any recent fever or cold symptoms.  Whether you are pregnant or may be pregnant.  Any previous reactions you have had during a blood transfusion. What are the risks? Generally, this is a safe procedure. However, problems may occur, including:  Having an allergic reaction to something in the donated blood. Hives and itching may be symptoms of this type of reaction.  Fever. This may be a reaction to the white blood cells in the transfused blood. Nausea or chest pain may accompany a fever.  Iron overload. This can happen from having many transfusions.  Transfusion-related acute lung injury (TRALI). This is a rare reaction that causes lung damage. The cause is not known.TRALI can occur within hours  of a transfusion or several days later.  Sudden (acute) or delayed hemolytic reactions. This happens if your blood does not match the cells in your transfusion. Your body's defense system (immune system) may try to attack the new cells. This complication is rare. The symptoms include fever, chills, nausea, and low back pain or chest pain.  Infection or disease transmission. This is rare.  What happens before the procedure?  You will have a blood test to determine your blood type. This is necessary to know what kind of blood your body will accept and to match it to the donor blood.  If you are going to have a planned surgery, you may be able to do an autologous blood donation. This may be done in case you need to have a transfusion.  If you have had an allergic reaction to a transfusion in the past, you may be given medicine to help prevent a reaction. This medicine may be given to you by mouth or through an IV tube.  You will have your temperature, blood pressure, and pulse monitored before the transfusion.  Follow instructions from your health care provider about eating and drinking restrictions.  Ask your health care provider about: ? Changing or stopping your regular medicines. This is especially important if you are taking diabetes medicines or blood thinners. ? Taking medicines such as aspirin and ibuprofen. These medicines can thin your blood. Do not take these medicines before your procedure if your health care provider instructs you not to. What happens during the procedure?  An IV tube will   be inserted into one of your veins.  The bag of donated blood will be attached to your IV tube. The blood will then enter through your vein.  Your temperature, blood pressure, and pulse will be monitored regularly during the transfusion. This monitoring is done to detect early signs of a transfusion reaction.  If you have any signs or symptoms of a reaction, your transfusion will be stopped  and you may be given medicine.  When the transfusion is complete, your IV tube will be removed.  Pressure may be applied to the IV site for a few minutes.  A bandage (dressing) will be applied. The procedure may vary among health care providers and hospitals. What happens after the procedure?  Your temperature, blood pressure, heart rate, breathing rate, and blood oxygen level will be monitored often.  Your blood may be tested to see how you are responding to the transfusion.  You may be warmed with fluids or blankets to maintain a normal body temperature. Summary  A blood transfusion is a procedure in which you receive donated blood, including plasma, platelets, and red blood cells, through an IV tube.  Your temperature, blood pressure, and pulse will be monitored before, during, and after the transfusion.  Your blood may be tested after the transfusion to see how your body has responded. This information is not intended to replace advice given to you by your health care provider. Make sure you discuss any questions you have with your health care provider. Document Released: 08/13/2000 Document Revised: 05/13/2016 Document Reviewed: 05/13/2016 Elsevier Interactive Patient Education  2018 [employer].   Tbo-Filgrastim injection What is this medicine? TBO-FILGRASTIM (T B O fil GRA stim) is a granulocyte colony-stimulating factor that stimulates the growth of neutrophils, a type of white blood cell important in the body's fight against infection. It is used to reduce the incidence of fever and infection in patients with certain types of cancer who are receiving chemotherapy that affects the bone marrow. This medicine may be used for other purposes; ask your health care provider or pharmacist if you have questions. COMMON BRAND NAME(S): Granix What should I tell my health care provider before I take this medicine? They need to know if you have any of these conditions: -bone scan or  tests planned -kidney disease -sickle cell anemia -an unusual or allergic reaction to tbo-filgrastim, filgrastim, pegfilgrastim, other medicines, foods, dyes, or preservatives -pregnant or trying to get pregnant -breast-feeding How should I use this medicine? This medicine is for injection under the skin. If you get this medicine at home, you will be taught how to prepare and give this medicine. Refer to the Instructions for Use that come with your medication packaging. Use exactly as directed. Take your medicine at regular intervals. Do not take your medicine more often than directed. It is important that you put your used needles and syringes in a special sharps container. Do not put them in a trash can. If you do not have a sharps container, call your pharmacist or healthcare provider to get one. Talk to your pediatrician regarding the use of this medicine in children. Special care may be needed. Overdosage: If you think you have taken too much of this medicine contact a poison control center or emergency room at once. NOTE: This medicine is only for you. Do not share this medicine with others. What if I miss a dose? It is important not to miss your dose. Call your doctor or health care professional if you  miss a dose. What may interact with this medicine? This medicine may interact with the following medications: -medicines that may cause a release of neutrophils, such as lithium This list may not describe all possible interactions. Give your health care provider a list of all the medicines, herbs, non-prescription drugs, or dietary supplements you use. Also tell them if you smoke, drink alcohol, or use illegal drugs. Some items may interact with your medicine. What should I watch for while using this medicine? You may need blood work done while you are taking this medicine. What side effects may I notice from receiving this medicine? Side effects that you should report to your doctor or  health care professional as soon as possible: -allergic reactions like skin rash, itching or hives, swelling of the face, lips, or tongue -blood in the urine -dark urine -dizziness -fast heartbeat -feeling faint -shortness of breath or breathing problems -signs and symptoms of infection like fever or chills; cough; or sore throat -signs and symptoms of kidney injury like trouble passing urine or change in the amount of urine -stomach or side pain, or pain at the shoulder -sweating -swelling of the legs, ankles, or abdomen -tiredness Side effects that usually do not require medical attention (report to your doctor or health care professional if they continue or are bothersome): -bone pain -headache -muscle pain -vomiting This list may not describe all possible side effects. Call your doctor for medical advice about side effects. You may report side effects to FDA at 1-800-FDA-1088. Where should I keep my medicine? Keep out of the reach of children. Store in a refrigerator between 2 and 8 degrees C (36 and 46 degrees F). Keep in carton to protect from light. Throw away this medicine if it is left out of the refrigerator for more than 5 consecutive days. Throw away any unused medicine after the expiration date. NOTE: This sheet is a summary. It may not cover all possible information. If you have questions about this medicine, talk to your doctor, pharmacist, or health care provider.  2018 Elsevier/Gold Standard (2015-10-06 19:07:04)

## 2017-11-13 LAB — BPAM RBC
BLOOD PRODUCT EXPIRATION DATE: 201904012359
ISSUE DATE / TIME: 201903160940
UNIT TYPE AND RH: 6200

## 2017-11-13 LAB — TYPE AND SCREEN
ABO/RH(D): A POS
Antibody Screen: NEGATIVE
Unit division: 0

## 2017-11-15 ENCOUNTER — Other Ambulatory Visit: Payer: Self-pay | Admitting: Surgery

## 2017-11-15 DIAGNOSIS — Z853 Personal history of malignant neoplasm of breast: Secondary | ICD-10-CM

## 2017-11-15 DIAGNOSIS — C773 Secondary and unspecified malignant neoplasm of axilla and upper limb lymph nodes: Secondary | ICD-10-CM | POA: Diagnosis not present

## 2017-11-15 DIAGNOSIS — C50912 Malignant neoplasm of unspecified site of left female breast: Secondary | ICD-10-CM | POA: Diagnosis not present

## 2017-11-16 ENCOUNTER — Ambulatory Visit: Payer: Medicare Other | Admitting: Hematology

## 2017-11-16 ENCOUNTER — Other Ambulatory Visit: Payer: Medicare Other

## 2017-11-16 ENCOUNTER — Ambulatory Visit: Payer: Medicare Other

## 2017-11-17 ENCOUNTER — Ambulatory Visit: Payer: Medicare Other

## 2017-11-21 ENCOUNTER — Encounter: Payer: Self-pay | Admitting: *Deleted

## 2017-11-22 NOTE — Progress Notes (Signed)
Lindsay [name]  [phone] [phone]  Clinic Follow up Note   Patient Care Team: Ronita Hipps, MD as PCP - General (Family Medicine) Coralie Keens, MD as Consulting Physician (General Surgery) Richardo Priest, MD as Consulting Physician (Cardiology) 11/23/2017  SUMMARY OF ONCOLOGIC HISTORY: Oncology History   Cancer Staging Malignant neoplasm of upper-inner quadrant of left breast in female, estrogen receptor negative ([place]) Staging form: Breast, AJCC 8th Edition - Clinical stage from 06/10/2017: Stage IIIB (cT2, cN1, cM0, G3, ER: Negative, PR: Negative, HER2: Negative) - Signed by Truitt Merle, MD on 06/21/2017       Malignant neoplasm of upper-inner quadrant of left breast in female, estrogen receptor negative ([place])   06/08/2017 Imaging    DIAGNOSTIC MAMMO and [place] Left Breast 06/08/17 IMPRESSION:  Highly suspicious palpable left breast 11 o'clock mass which measures 2.8 cm in greatest dimension. Ductal extension anteriorly to the level of the nipple, and posteriorly for additional 1.8cm, is suspicious for tumor extension.       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 Wall thickness was   increased in a pattern of moderate LVH. Systolic function was   normal. The estimated ejection fraction was in  the range of 60%   to 65%. Wall motion was normal; there were no regional wall   motion abnormalities. Left ventricular diastolic function   parameters were normal. - Mitral valve: Calcified annulus. Mildly thickened leaflets . - Left atrium: The atrium was mildly dilated. - Atrial septum: There was increased thickness of the septum,   consistent with lipomatous hypertrophy. No defect or patent   foramen ovale was identified. - Impressions: Normal GLS - 18.6 Impressions: - Normal GLS - 18.6      06/28/2017 Imaging    CT CAP W Contrast 06/28/17 IMPRESSION: 1. Mildly enlarged left axillary lymph node at 1.5 cm, a significant change from the prior chest CT from 2015, suspicious for left axillary metastatic disease. 2. Left upper breast lesion 2.1 cm in diameter with higher density margins and lower density center, favoring lumpectomy site. 3. Mildly enlarged right hilar lymph node, but stable compared back through 2015. 4. No findings of osseous metastatic disease or additional metastatic sites. 5. Other imaging findings of potential clinical significance: Aberrant right subclavian artery passes behind the esophagus. Aortic Atherosclerosis (ICD10-I70.0) and Emphysema (ICD10-J43.9). Coronary atherosclerosis. Mild nodular enlargement of the left inferior thyroid lobe, isodense to the rest of the thyroid. Airway thickening is present, suggesting bronchitis or reactive airways disease. Left hepatic lobe cyst. Left renal cyst. Likely small cysts in the right kidney. Sigmoid colon diverticulosis.       06/28/2017 Imaging    Bone Scan 06/28/17 IMPRESSION: No evidence of osseous metastatic disease. Subtle increased radiotracer uptake within the left chest wall, likely within the left breast.  06/30/2017 Surgery    Port placement by Dr. [name] on 06/30/17      07/01/2017 - 11/09/2017 Chemotherapy    Neoadjuvant Adriamycin and Cytoxan (AC) every 2 weeks for 4 cycles starting  07/01/17. Dose reduced starting with cycle 2. Completed AC on 12.19.18. Started weekly Carbo and Taxol every on 08/31/17.  Taxol changed to abraxane on 1/23 due to suspected taxol drug rash, carboplatin decreased with cycle 4 for mild thrombocytopenia. Due to cytopenia, reduced Carbo to 1.0 AUC starting with cycle 10.  Stopped after 11 cycles due to peripheral neuropathy.          07/23/2017 - 07/26/2017 Hospital Admission    Admit date: 07/23/17 Admission diagnosis: 07/26/17 Additional comments: febrile neutropenia; diverticulitis      08/29/2017 Mammogram    Diagnostic Mammogram and [place] 08/29/17  IMPRESSION:  Interval decrease in the size of known left 11 o'clock malignancy, with greatest dimension of 1.3cm.  Interval decrease in size of known left malignant axillary lymph node, with cortical thickness of 3 mm.        09/09/2017 Genetic Testing    The patient had genetic testing due to a personal history of breast cancer and skin cancer,  and a family history of breast cancer, pancreatic cancer, melanoma, and colon cancer.  The Common Hereditary Cancer Panel + Melanoma Panel + Basal Cell Nevus Syndrome Panel was ordered (58 genes).  The following genes were evaluated for sequence changes and exonic deletions/duplications: APC, ATM, AXIN2, BAP1, BARD1, BMPR1A, BRCA1, BRCA2, BRIP1, CDH1, CDK4, CDKN2A (p14ARF), CDKN2A (p16INK4a), CHEK2, CTNNA1, DICER1, EPCAM*, FANCC, GREM1*, KIT, MC1R, MEN1, MLH1, MSH2, MSH3, MSH6, MUTYH, NBN, NF1, PALB2, PALLD, PDGFRA, PMS2, POLD1, POLE, POT1, PTCH1, PTCH2, PTEN, RAD50, RAD51C, RAD51D, RB1, SDHB, SDHC, SDHD, SMAD4, SMARCA4, STK11, SUFU, TERT, TP53, TSC1, TSC2, VHL. The following genes were evaluated for sequence changes only:HOXB13*, MITF*, NTHL1*, SDHA  Results: Negative, no pathogenic variants identified. The date of this test report is 09/09/2017.      CURRENT THERAPY:  pre surgery    INTERVAL HISTORY: Lindsay [name] is here today for follow up after completing her  neo adjuvant chemotherapy on 11/09/17.  She had some difficulty with anemia and underwent a blood transfusion on 11/12/17.  She continues to be fatigued, however is feeling better than while on chemotherapy.  Lindsay [name] chemotherapy was stopped one week early due to peripheral neuropathy.  She says that her numbness in her fingertips and toes have improved, and she denies any motor deficits such as buttoning, writing, or walking. Lindsay [name] is slightly concerned that her labs are still slightly low.  She is happy that she can no longer feel her tumor or where it was in her breast.     REVIEW OF SYSTEMS:   Review of Systems  Constitutional: Positive for fatigue. Negative for appetite change, chills, fever and unexpected weight change.  HENT:   Negative for hearing loss, lump/mass, mouth sores and trouble swallowing.   Eyes: Negative for eye problems and icterus.  Respiratory: Negative for chest tightness, cough and shortness of breath.   Cardiovascular: Negative for chest pain, leg swelling and palpitations.  Gastrointestinal: Negative for abdominal distention, abdominal pain, constipation, diarrhea and vomiting.  Endocrine: Negative for hot flashes.  Musculoskeletal: Negative for arthralgias.  Skin: Negative for itching and rash.  Neurological: Positive for numbness. Negative for dizziness, extremity weakness and headaches.  Hematological: Negative for adenopathy.  Psychiatric/Behavioral: Negative for depression. The patient is not nervous/anxious.      MEDICAL HISTORY:  Past  Medical History:  Diagnosis Date  . Breast cancer ([place])   . Cancer [facility])    left breast cancer  . Dysrhythmia    PACs and nonsustained runs of atrial tach by Holter 10/13/16  . Family history of breast cancer   . Family history of colon cancer   . Family history of melanoma   . Family history of pancreatic cancer   . History of kidney stones   . Hypertension   . Personal history of colonic polyps   . Personal history of  skin cancer     SURGICAL HISTORY: Past Surgical History:  Procedure Laterality Date  . COLONOSCOPY    . FOOT SURGERY Right   . IR CV LINE INJECTION  08/02/2017  . left breast lumpectomy  2000  . PORTACATH PLACEMENT Right 06/30/2017   Procedure: [place];  Surgeon: Coralie Keens, MD;  Location: [place];  Service: General;  Laterality: Right;    I have reviewed the social history and family history with the patient and they are unchanged from previous note.  ALLERGIES:  has No Known Allergies.  MEDICATIONS:  Current Outpatient Medications  Medication Sig Dispense Refill  . acebutolol (SECTRAL) 200 MG capsule Take 200 mg by mouth 2 (two) times daily.    Marland Kitchen aspirin EC 81 MG tablet Take 81 mg by mouth daily.    Marland Kitchen atorvastatin (LIPITOR) 10 MG tablet Take 10 mg by mouth at bedtime.    . calcium carbonate (OS-CAL) 600 MG TABS tablet Take 1,200 mg by mouth daily.    . Cholecalciferol (VITAMIN D3) 2000 units TABS Take 2,000 Units by mouth daily.     . cloNIDine (CATAPRES) 0.1 MG tablet Take 0.1 mg by mouth 2 (two) times daily.    . DOCOSAHEXAENOIC ACID PO Take 1,200 mg by mouth 2 (two) times daily.    Marland Kitchen escitalopram (LEXAPRO) 5 MG tablet Take 5 mg by mouth daily.    . hydrALAZINE (APRESOLINE) 25 MG tablet Take 12.5 mg by mouth 3 (three) times daily.    Marland Kitchen lidocaine-prilocaine (EMLA) cream Apply 1 application topically as needed. 30 g 2  . loratadine (CLARITIN) 10 MG tablet Take 10 mg by mouth daily.    Marland Kitchen LORazepam (ATIVAN) 1 MG tablet Take 1 tablet (1 mg total) by mouth once as needed for anxiety. Take 1 tab 1 hour before MRI scan 2 tablet 0  . magnesium oxide (MAG-OX) 400 MG tablet Take 400 mg by mouth daily.    . Multiple Vitamins-Minerals (EYE VITAMINS PO) Take 1 capsule by mouth 2 (two) times daily.    Marland Kitchen olmesartan-hydrochlorothiazide (BENICAR HCT) 40-25 MG tablet Take 1 tablet by mouth daily.  1  . ondansetron (ZOFRAN) 8 MG tablet Take 1 tablet (8 mg total) by  mouth every 8 (eight) hours as needed for nausea or vomiting. 20 tablet 2  . prochlorperazine (COMPAZINE) 10 MG tablet Take 1 tablet (10 mg total) by mouth every 6 (six) hours as needed for nausea or vomiting. 30 tablet 2  . vitamin B-12 (CYANOCOBALAMIN) 1000 MCG tablet Take 1 tablet by mouth daily.     No current facility-administered medications for this visit.    Facility-Administered Medications Ordered in Other Visits  Medication Dose Route Frequency Provider Last Rate Last Dose  . sodium chloride flush (NS) 0.9 % injection 10 mL  10 mL Intravenous PRN Truitt Merle, MD   10 mL at 07/29/17 1310    PHYSICAL EXAMINATION: ECOG PERFORMANCE STATUS: 1 - Symptomatic  but completely ambulatory  Vitals:   11/23/17 1115  BP: (!) 143/86  Pulse: 78  Resp: 18  Temp: 97.6 F (36.4 C)  SpO2: 95%   Filed Weights   11/23/17 1115  Weight: 247 lb 1.6 oz (112.1 kg)  GENERAL: Patient is a well appearing female in no acute distress HEENT:  Sclerae anicteric.  Oropharynx clear and moist. No ulcerations or evidence of oropharyngeal candidiasis. Neck is supple.  NODES:  No cervical, supraclavicular, or axillary lymphadenopathy palpated.  BREAST EXAM:  Deferred. LUNGS:  Clear to auscultation bilaterally.  No wheezes or rhonchi. HEART:  Regular rate and rhythm. No murmur appreciated. ABDOMEN:  Soft, nontender.  Positive, normoactive bowel sounds. No organomegaly palpated. MSK:  No focal spinal tenderness to palpation. Full range of motion bilaterally in the upper extremities. EXTREMITIES:  No peripheral edema.   SKIN:  Clear with no obvious rashes or skin changes. No nail dyscrasia. NEURO:  Nonfocal. Well oriented.  Appropriate affect.    LABORATORY DATA:  I have reviewed the data as listed CBC Latest Ref Rng & Units 11/23/2017 11/09/2017 11/02/2017  WBC 3.9 - 10.3 K/uL 4.0 3.3(L) 3.9  Hemoglobin 11.6 - 15.9 g/dL 8.7(L) 8.1(L) 8.7(L)  Hematocrit 34.8 - 46.6 % 26.9(L) 24.8(L) 26.2(L)  Platelets 145 -  400 K/uL 125(L) 94(L) 121(L)     CMP Latest Ref Rng & Units 11/23/2017 11/09/2017 11/02/2017  Glucose 70 - 140 mg/dL 105 111 104  BUN 7 - 26 mg/dL 18 26 27(H)  Creatinine 0.60 - 1.10 mg/dL 1.06 1.31(H) 1.17(H)  Sodium 136 - 145 mmol/L 137 138 139  Potassium 3.5 - 5.1 mmol/L 4.3 4.4 4.3  Chloride 98 - 109 mmol/L 103 103 103  CO2 22 - 29 mmol/L '27 29 28  ' Calcium 8.4 - 10.4 mg/dL 9.2 9.3 9.0  Total Protein 6.4 - 8.3 g/dL 6.1(L) 5.8(L) 5.9(L)  Total Bilirubin 0.2 - 1.2 mg/dL 0.6 0.6 0.6  Alkaline Phos 40 - 150 U/L 60 56 57  AST 5 - 34 U/L '22 20 22  ' ALT 0 - 55 U/L '18 16 19   ' PATHOLOGY:  Diagnosis 06/10/17 1.) Breast, left, needle core biopsy -INVASIVE DUCTAL CARCINOMA, GRADE 3, 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 No results found.   ASSESSMENT & PLAN: Lindsay [name] is a wonderful 72 y.o. female who presents today to discuss the ongoing management of her left breast cancer.      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  Lindsay [name] labs are improved today.  She is doing well after chemotherapy.  She will undergo post chemotherapy mammogram and ultrasound on 11/28/17 and has surgery scheduled for 12/06/17.  I spoke with Dr. [name] today and he will leave the port in during surgery.  Lindsay [name] is aware and ok with this.  I reviewed the SWOG [phone] study with Lindsay [name] and  her daughter.     Chemotherapy induced anemia, thrombocytopenia: Labs are improved today.  I reviewed  her labs with Dr. Burr Medico and Dr. [name].  She will return next week for a lab visit.  Dr. [name] would like to decide whether or not we should give her blood, or if he should during surgery.    Peripheral neuropathy, G1: This is improving, which is a good sign.  We reviewed what she may need in the future, if it continues.    Patient to return next week for lab visit.  She should also see Dr. Burr Medico after surgery to review pathology and results of surgery.     All questions were answered. The patient knows to call the clinic with any problems, questions or concerns. No barriers to learning were detected.  A total of (30) minutes of face-to-face time was spent with this patient with greater than 50% of that time in counseling and care-coordination.       Scot Dock, NP 11/23/17

## 2017-11-23 ENCOUNTER — Inpatient Hospital Stay: Payer: Medicare Other

## 2017-11-23 ENCOUNTER — Encounter: Payer: Self-pay | Admitting: Adult Health

## 2017-11-23 ENCOUNTER — Inpatient Hospital Stay (HOSPITAL_BASED_OUTPATIENT_CLINIC_OR_DEPARTMENT_OTHER): Payer: Medicare Other | Admitting: Adult Health

## 2017-11-23 ENCOUNTER — Telehealth: Payer: Self-pay | Admitting: Adult Health

## 2017-11-23 VITALS — BP 143/86 | HR 78 | Temp 97.6°F | Resp 18 | Ht 67.0 in | Wt 247.1 lb

## 2017-11-23 DIAGNOSIS — Z95828 Presence of other vascular implants and grafts: Secondary | ICD-10-CM

## 2017-11-23 DIAGNOSIS — D6959 Other secondary thrombocytopenia: Secondary | ICD-10-CM | POA: Diagnosis not present

## 2017-11-23 DIAGNOSIS — C773 Secondary and unspecified malignant neoplasm of axilla and upper limb lymph nodes: Secondary | ICD-10-CM

## 2017-11-23 DIAGNOSIS — Z171 Estrogen receptor negative status [ER-]: Principal | ICD-10-CM

## 2017-11-23 DIAGNOSIS — Z5111 Encounter for antineoplastic chemotherapy: Secondary | ICD-10-CM | POA: Diagnosis not present

## 2017-11-23 DIAGNOSIS — C50212 Malignant neoplasm of upper-inner quadrant of left female breast: Secondary | ICD-10-CM | POA: Diagnosis not present

## 2017-11-23 DIAGNOSIS — G620 Drug-induced polyneuropathy: Secondary | ICD-10-CM | POA: Diagnosis not present

## 2017-11-23 DIAGNOSIS — T451X5A Adverse effect of antineoplastic and immunosuppressive drugs, initial encounter: Secondary | ICD-10-CM | POA: Diagnosis not present

## 2017-11-23 DIAGNOSIS — D6481 Anemia due to antineoplastic chemotherapy: Secondary | ICD-10-CM | POA: Diagnosis not present

## 2017-11-23 DIAGNOSIS — Z5189 Encounter for other specified aftercare: Secondary | ICD-10-CM | POA: Diagnosis not present

## 2017-11-23 LAB — COMPREHENSIVE METABOLIC PANEL
ALBUMIN: 3.5 g/dL (ref 3.5–5.0)
ALT: 18 U/L (ref 0–55)
AST: 22 U/L (ref 5–34)
Alkaline Phosphatase: 60 U/L (ref 40–150)
Anion gap: 7 (ref 3–11)
BUN: 18 mg/dL (ref 7–26)
CHLORIDE: 103 mmol/L (ref 98–109)
CO2: 27 mmol/L (ref 22–29)
CREATININE: 1.06 mg/dL (ref 0.60–1.10)
Calcium: 9.2 mg/dL (ref 8.4–10.4)
GFR calc non Af Amer: 50 mL/min — ABNORMAL LOW (ref >60)
GFR, EST AFRICAN AMERICAN: 58 mL/min — AB (ref >60)
Glucose, Bld: 105 mg/dL (ref 70–140)
Potassium: 4.3 mmol/L (ref 3.5–5.1)
SODIUM: 137 mmol/L (ref 136–145)
Total Bilirubin: 0.6 mg/dL (ref 0.2–1.2)
Total Protein: 6.1 g/dL — ABNORMAL LOW (ref 6.4–8.3)

## 2017-11-23 LAB — CBC WITH DIFFERENTIAL/PLATELET
Basophils Absolute: 0 10*3/uL (ref 0.0–0.1)
Basophils Relative: 1 %
Eosinophils Absolute: 0 10*3/uL (ref 0.0–0.5)
Eosinophils Relative: 0 %
HEMATOCRIT: 26.9 % — AB (ref 34.8–46.6)
HEMOGLOBIN: 8.7 g/dL — AB (ref 11.6–15.9)
LYMPHS ABS: 1.4 10*3/uL (ref 0.9–3.3)
Lymphocytes Relative: 34 %
MCH: 32 pg (ref 25.1–34.0)
MCHC: 32.3 g/dL (ref 31.5–36.0)
MCV: 98.9 fL (ref 79.5–101.0)
MONOS PCT: 17 %
Monocytes Absolute: 0.7 10*3/uL (ref 0.1–0.9)
NEUTROS PCT: 48 %
Neutro Abs: 1.9 10*3/uL (ref 1.5–6.5)
Platelets: 125 10*3/uL — ABNORMAL LOW (ref 145–400)
RBC: 2.72 MIL/uL — ABNORMAL LOW (ref 3.70–5.45)
RDW: 19.9 % — ABNORMAL HIGH (ref 11.2–14.5)
WBC: 4 10*3/uL (ref 3.9–10.3)

## 2017-11-23 MED ORDER — SODIUM CHLORIDE 0.9% FLUSH
10.0000 mL | INTRAVENOUS | Status: DC | PRN
  Filled 2017-11-23: qty 10

## 2017-11-23 MED ORDER — TBO-FILGRASTIM 480 MCG/0.8ML ~~LOC~~ SOSY: 480.0000 ug | PREFILLED_SYRINGE | Freq: Once | SUBCUTANEOUS | Status: DC

## 2017-11-23 MED ORDER — HEPARIN SOD (PORK) LOCK FLUSH 100 UNIT/ML IV SOLN
500.0000 [IU] | Freq: Once | INTRAVENOUS | Status: DC | PRN
  Filled 2017-11-23: qty 5

## 2017-11-23 NOTE — Telephone Encounter (Signed)
Called regarding 4/2

## 2017-11-23 NOTE — Assessment & Plan Note (Signed)
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  Lindsay [name]'s labs are improved today.  She is doing well after chemotherapy.  She will undergo post chemotherapy mammogram and ultrasound on 11/28/17 and has surgery scheduled for 12/06/17.  I spoke with Dr. [name] today and he will leave the port in during surgery.  Lindsay [name] is aware and ok with this.  I reviewed the SWOG [id number] study with Lindsay [name] and her daughter.     Chemotherapy induced anemia, thrombocytopenia: Labs are improved today.  I reviewed her labs with Dr. Burr Medico and Dr. [name].  She will return next week for a lab visit.  Dr. [name] would like to decide whether or not we should give her blood, or if he should during surgery.    Peripheral neuropathy, G1: This is improving, which is a good sign.  We reviewed what she may need in the future, if it continues.    Patient to return next week for lab visit.  She should also see Dr. Burr Medico after surgery to review pathology and results of surgery.

## 2017-11-24 ENCOUNTER — Telehealth: Payer: Self-pay | Admitting: Adult Health

## 2017-11-24 NOTE — Telephone Encounter (Signed)
Per 3/27 no los

## 2017-11-28 DIAGNOSIS — R921 Mammographic calcification found on diagnostic imaging of breast: Secondary | ICD-10-CM | POA: Diagnosis not present

## 2017-11-28 DIAGNOSIS — C50212 Malignant neoplasm of upper-inner quadrant of left female breast: Secondary | ICD-10-CM | POA: Diagnosis not present

## 2017-11-28 DIAGNOSIS — N6322 Unspecified lump in the left breast, upper inner quadrant: Secondary | ICD-10-CM | POA: Diagnosis not present

## 2017-11-28 DIAGNOSIS — Z171 Estrogen receptor negative status [ER-]: Secondary | ICD-10-CM | POA: Diagnosis not present

## 2017-11-28 DIAGNOSIS — C773 Secondary and unspecified malignant neoplasm of axilla and upper limb lymph nodes: Secondary | ICD-10-CM | POA: Diagnosis not present

## 2017-11-29 ENCOUNTER — Telehealth: Payer: Self-pay | Admitting: *Deleted

## 2017-11-29 ENCOUNTER — Inpatient Hospital Stay: Payer: Medicare Other | Attending: Hematology

## 2017-11-29 DIAGNOSIS — Z171 Estrogen receptor negative status [ER-]: Secondary | ICD-10-CM

## 2017-11-29 DIAGNOSIS — C50212 Malignant neoplasm of upper-inner quadrant of left female breast: Secondary | ICD-10-CM | POA: Insufficient documentation

## 2017-11-29 LAB — CBC WITH DIFFERENTIAL/PLATELET
BASOS ABS: 0 10*3/uL (ref 0.0–0.1)
BASOS PCT: 1 %
EOS PCT: 1 %
Eosinophils Absolute: 0 10*3/uL (ref 0.0–0.5)
HCT: 28.1 % — ABNORMAL LOW (ref 34.8–46.6)
Hemoglobin: 9.2 g/dL — ABNORMAL LOW (ref 11.6–15.9)
Lymphocytes Relative: 29 %
Lymphs Abs: 1.8 10*3/uL (ref 0.9–3.3)
MCH: 32.6 pg (ref 25.1–34.0)
MCHC: 32.8 g/dL (ref 31.5–36.0)
MCV: 99.3 fL (ref 79.5–101.0)
Monocytes Absolute: 1 10*3/uL — ABNORMAL HIGH (ref 0.1–0.9)
Monocytes Relative: 16 %
Neutro Abs: 3.4 10*3/uL (ref 1.5–6.5)
Neutrophils Relative %: 53 %
PLATELETS: 144 10*3/uL — AB (ref 145–400)
RBC: 2.83 MIL/uL — ABNORMAL LOW (ref 3.70–5.45)
RDW: 22.1 % — AB (ref 11.2–14.5)
WBC: 6.2 10*3/uL (ref 3.9–10.3)

## 2017-11-29 LAB — COMPREHENSIVE METABOLIC PANEL
ALT: 19 U/L (ref 0–55)
ANION GAP: 8 (ref 3–11)
AST: 22 U/L (ref 5–34)
Albumin: 3.5 g/dL (ref 3.5–5.0)
Alkaline Phosphatase: 56 U/L (ref 40–150)
BUN: 17 mg/dL (ref 7–26)
CHLORIDE: 103 mmol/L (ref 98–109)
CO2: 28 mmol/L (ref 22–29)
Calcium: 9.5 mg/dL (ref 8.4–10.4)
Creatinine, Ser: 0.99 mg/dL (ref 0.60–1.10)
GFR calc Af Amer: >60 mL/min (ref >60)
GFR, EST NON AFRICAN AMERICAN: 55 mL/min — AB (ref >60)
Glucose, Bld: 110 mg/dL (ref 70–140)
POTASSIUM: 4 mmol/L (ref 3.5–5.1)
Sodium: 139 mmol/L (ref 136–145)
Total Bilirubin: 0.5 mg/dL (ref 0.2–1.2)
Total Protein: 6.1 g/dL — ABNORMAL LOW (ref 6.4–8.3)

## 2017-11-29 NOTE — Telephone Encounter (Signed)
Received faxed results of mammogram and US done 11/28/17 at [facility].  Left results on Dr. Ernestina Penna desk for review.

## 2017-11-29 NOTE — Pre-Procedure Instructions (Signed)
Lindsay [name]  11/29/2017      [id number] - [place], [place] - [address] [place] [address] Phone: [phone] Fax: [phone]    Your procedure is scheduled on April 9  Report to [facility] at 0700 A.M.  Call this number if you have problems the morning of surgery:  [phone]   Remember:  Do not eat food  after midnight.  Please complete your PRE-SURGERY ENSURE that was given to before you leave your house the morning of surgery.  Please, if able, drink it in one setting. DO NOT SIP.   Continue all medications as directed by your physician except follow these medication instructions before surgery below   Take these medicines the morning of surgery with A SIP OF WATER  acebutolol (SECTRAL) cloNIDine (CATAPRES)  escitalopram (LEXAPRO) hydrALAZINE (APRESOLINE)  loratadine (CLARITIN)  7 days prior to surgery STOP taking any Aspirin(unless otherwise instructed by your surgeon), Aleve, Naproxen, Ibuprofen, Motrin, Advil, Goody's, BC's, all herbal medications, fish oil, and all vitamins   Do not wear jewelry, make-up or nail polish.  Do not wear lotions, powders, or perfumes, or deodorant.  Do not shave 48 hours prior to surgery.    Do not bring valuables to the hospital.  [facility] is not responsible for any belongings or valuables.  Contacts, dentures or bridgework may not be worn into surgery.  Leave your suitcase in the car.  After surgery it may be brought to your room.  For patients admitted to the hospital, discharge time will be determined by your treatment team.  Patients discharged the day of surgery will not be allowed to drive home.    Special instructions:   [facility]- Preparing For Surgery  Before surgery, you can play an important role. Because skin is not sterile, your skin needs to be as free of germs as possible. You can reduce the number of germs on your skin by washing with CHG  (chlorahexidine gluconate) Soap before surgery.  CHG is an antiseptic cleaner which kills germs and bonds with the skin to continue killing germs even after washing.  Please do not use if you have an allergy to CHG or antibacterial soaps. If your skin becomes reddened/irritated stop using the CHG.  Do not shave (including legs and underarms) for at least 48 hours prior to first CHG shower. It is OK to shave your face.  Please follow these instructions carefully.   1. Shower the NIGHT BEFORE SURGERY and the MORNING OF SURGERY with CHG.   2. If you chose to wash your hair, wash your hair first as usual with your normal shampoo.  3. After you shampoo, rinse your hair and body thoroughly to remove the shampoo.  4. Use CHG as you would any other liquid soap. You can apply CHG directly to the skin and wash gently with a scrungie or a clean washcloth.   5. Apply the CHG Soap to your body ONLY FROM THE NECK DOWN.  Do not use on open wounds or open sores. Avoid contact with your eyes, ears, mouth and genitals (private parts). Wash Face and genitals (private parts)  with your normal soap.  6. Wash thoroughly, paying special attention to the area where your surgery will be performed.  7. Thoroughly rinse your body with warm water from the neck down.  8. DO NOT shower/wash with your normal soap after using and rinsing off the CHG Soap.  9.  Pat yourself dry with a CLEAN TOWEL.  10. Wear CLEAN PAJAMAS to bed the night before surgery, wear comfortable clothes the morning of surgery  11. Place CLEAN SHEETS on your bed the night of your first shower and DO NOT SLEEP WITH PETS.    Day of Surgery: Do not apply any deodorants/lotions. Please wear clean clothes to the hospital/surgery center.      Please read over the following fact sheets that you were given.

## 2017-11-30 ENCOUNTER — Other Ambulatory Visit: Payer: Self-pay

## 2017-11-30 ENCOUNTER — Encounter (HOSPITAL_COMMUNITY)
Admission: RE | Admit: 2017-11-30 | Discharge: 2017-11-30 | Disposition: A | Discharge status: Home / Self Care | Payer: Medicare Other | Source: Ambulatory Visit | Attending: Surgery | Admitting: Surgery

## 2017-11-30 ENCOUNTER — Encounter (HOSPITAL_COMMUNITY): Payer: Self-pay

## 2017-11-30 DIAGNOSIS — Z171 Estrogen receptor negative status [ER-]: Secondary | ICD-10-CM | POA: Insufficient documentation

## 2017-11-30 DIAGNOSIS — I10 Essential (primary) hypertension: Secondary | ICD-10-CM | POA: Insufficient documentation

## 2017-11-30 DIAGNOSIS — C50212 Malignant neoplasm of upper-inner quadrant of left female breast: Secondary | ICD-10-CM | POA: Diagnosis not present

## 2017-11-30 DIAGNOSIS — Z79899 Other long term (current) drug therapy: Secondary | ICD-10-CM | POA: Insufficient documentation

## 2017-11-30 DIAGNOSIS — Z87891 Personal history of nicotine dependence: Secondary | ICD-10-CM | POA: Insufficient documentation

## 2017-11-30 DIAGNOSIS — Z7982 Long term (current) use of aspirin: Secondary | ICD-10-CM | POA: Insufficient documentation

## 2017-11-30 DIAGNOSIS — Z01812 Encounter for preprocedural laboratory examination: Secondary | ICD-10-CM | POA: Diagnosis not present

## 2017-11-30 HISTORY — DX: Dyspnea, unspecified: R06.00

## 2017-11-30 NOTE — Progress Notes (Signed)
PCP - Kennith Maes Cardiologist - [facility] - records in media tab  06/2017  Chest x-ray - not needed EKG - 06/24/17 Stress Test - 2015 ECHO - 2018 Cardiac Cath denies -    Anesthesia review: yes, Levada Dy note 06/24/17  Patient denies shortness of breath, fever, cough and chest pain at PAT appointment   Patient verbalized understanding of instructions that were given to them at the PAT appointment. Patient was also instructed that they will need to review over the PAT instructions again at home before surgery.

## 2017-12-01 NOTE — Progress Notes (Signed)
Anesthesia Chart Review:  Pt is a 72 year old female scheduled for left mastectomy with targeted lymph node dissection on 12/06/17 with Coralie Keens, MD  - PCP is Kennith Maes, MD - Cardiologist is Shirlee More, MD. Last office visit 11/01/16 (notes in care everywhere)   PMH includes:   HTN, PACs, breast cancer. Former smoker. BMI 39. S/p Port-a-cath insertion 06/30/17  Medications include: acebutolol, lipitor, clonidine, hydralazine, olmesartan-hctz.   BP 133/60   Pulse 70   Temp 36.8 C (Oral)   Resp 18   Ht 5\' 7"  (1.702 m)   Wt 248 lb 8 oz (112.7 kg)   SpO2 97%   BMI 38.92 kg/m     Labs 11/29/17 reviewed.   - H/H 9.2/28.1.  This is consistent with recent prior results. I notified April in Dr. Trevor Mace office. Dr. Burr Medico (ordering provider) has reviewed lab results.   EKG 06/24/17: Sinus bradycardia (56 bpm) with PACs  Echo 06/23/17:  - Left ventricle: The cavity size was normal. Wall thickness wasincreased in a pattern of moderate LVH. Systolic function wasnormal. The estimated ejection fraction was in the range of 60%to 65%. Wall motion was normal; there were no regional wallmotion abnormalities. Left ventricular diastolic functionparameters were normal. - Mitral valve: Calcified annulus. Mildly thickened leaflets . - Left atrium: The atrium was mildly dilated. - Atrial septum: There was increased thickness of the septum,consistent with lipomatous hypertrophy. No defect or patentforamen ovale was identified. - Impressions: Normal GLS - 18.6  Holter monitor 10/13/16 [facility]ology [place]):  By notes: - Holter monitor done recently was Abnormal  - Basic rhythm was normal sinus . - Average HR was 57 BPM. - Maximum HR was 111 BPM. - Minimum HR was 46 BPM. 1. Ventricular: rare PVCs, one 8-beat run of wide complex tachycardia. 2. Supraventricular: frequent PACs, 6% of all complexes, with occasion runs of SVT, Appear to be ectopic automatic tachycardias,  longest run 17 beats, irregular at 100/min. 3. Bradyarrhythmias and Pauses: No Symptoms reported: Palpitations x 1 - assoc with isolated PACs CONCLUSIONS: 1. Abnormal Holter monitor.  2. Arrhythmia as described: freq PACs and occasional runs non-sustained atrial tachycardia 3. Symptoms were reported  4. Symptoms were not correlated closely with arrhythmias  Nuclear stress test 04/16/14 (Correspondence 07/01/17 in media tab):  - myocardial perfusion imaging showed no evidence of ischemia. Overall LV systolic function normal without regional wall motion abnormalities. EF 64%.  If no changes, I anticipate pt can proceed with surgery as scheduled.   Willeen Cass, FNP-BC [facility]/Anesthesiology Phone: [phone] 12/01/2017 3:32 PM

## 2017-12-02 ENCOUNTER — Ambulatory Visit
Admission: RE | Admit: 2017-12-02 | Discharge: 2017-12-02 | Disposition: A | Discharge status: Home / Self Care | Payer: Medicare Other | Source: Ambulatory Visit | Attending: Surgery | Admitting: Surgery

## 2017-12-02 ENCOUNTER — Other Ambulatory Visit: Payer: Self-pay | Admitting: Surgery

## 2017-12-02 DIAGNOSIS — Z853 Personal history of malignant neoplasm of breast: Secondary | ICD-10-CM

## 2017-12-02 DIAGNOSIS — C773 Secondary and unspecified malignant neoplasm of axilla and upper limb lymph nodes: Secondary | ICD-10-CM | POA: Diagnosis not present

## 2017-12-05 NOTE — H&P (Signed)
Lindsay [name] Documented: 11/15/2017 10:54 AM Location: [facility] Patient #: [id number] DOB: 14-Oct-1945 Married / Language: English / Race: White Female   History of Present Illness (Lindsay Milholland A. [name] MD; 11/15/2017 11:29 AM) The patient is a 72 year old female who presents with breast cancer. 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 She has been undergoing neoadjuvant therapy. She has a prior history of DCIS of the left breast. She did not have radiation. I placed a Port-A-Cath in her November. She is about to finish her therapy. Other than a rash, she is currently doing fairly well. She has had significant reduction in the size of the mass as well as in the adenopathy based on scans in December. She is having new films performed in the first week of April.   Allergies Malachi Bonds, CMA; 11/15/2017 10:55 AM) No Known Drug Allergies [06/17/2017]:  Medication History Malachi Bonds, CMA; 11/15/2017 10:55 AM) Acebutolol HCl (200MG  Capsule, Oral) Active. Atorvastatin Calcium (10MG  Tablet, Oral) Active. Cefdinir (300MG  Capsule, Oral) Active. CloNIDine HCl (0.1MG  Tablet, Oral) Active. Escitalopram Oxalate (5MG  Tablet, Oral) Active. HydrALAZINE HCl (25MG  Tablet, Oral) Active. Olmesartan Medoxomil (40MG  Tablet, Oral) Active. Olmesartan Medoxomil-HCTZ (40-25MG  Tablet, Oral) Active. Valsartan (320MG  Tablet, Oral) Active. Medications Reconciled  Vitals (Chemira Jones CMA; 11/15/2017 10:54 AM) 11/15/2017 10:54 AM Weight: 247.2 lb Height: 67in Body Surface Area: 2.21 m Body Mass Index: 38.72 kg/m  Temp.: 98.59F(Oral)  Pulse: 95 (Regular)  BP: 130/74 (Sitting, Left Arm, Standard)  Physical Exam  General Mental Status-Alert. General Appearance-Consistent with stated age. Hydration-Well hydrated. Voice-Normal.  Head and Neck Head-normocephalic, atraumatic with no lesions or palpable  masses. Trachea-midline. Thyroid Gland Characteristics - normal size and consistency.  Eye Eyeball - Bilateral-Extraocular movements intact. Sclera/Conjunctiva - Bilateral-No scleral icterus.  Chest and Lung Exam Chest and lung exam reveals -quiet, even and easy respiratory effort with no use of accessory muscles and on auscultation, normal breath sounds, no adventitious sounds and normal vocal resonance. Inspection Chest Wall - Normal. Back - normal.  Breast Breast - Left-Non Tender, No Biopsy scars, no Dimpling, No Inflammation, No Lumpectomy scars, No Mastectomy scars, No Peau d' Orange. Note: There is inversion of the left nipple and a fullness underneath the areola. I cannot feel any enlarged lymph nodes in the left axilla Breast - Right-Symmetric, Non Tender, No Biopsy scars, no Dimpling, No Inflammation, No Lumpectomy scars, No Mastectomy scars, No Peau d' Orange.  Cardiovascular Cardiovascular examination reveals -normal heart sounds, regular rate and rhythm with no murmurs and normal pedal pulses bilaterally.  Abdomen Inspection Inspection of the abdomen reveals - No Hernias. Skin - Scar - no surgical scars. Palpation/Percussion Palpation and Percussion of the abdomen reveal - Soft, Non Tender, No Rebound tenderness, No Rigidity (guarding) and No hepatosplenomegaly. Auscultation Auscultation of the abdomen reveals - Bowel sounds normal.  Neurologic - Did not examine.  Musculoskeletal - Did not examine.  Lymphatic Head & Neck  General Head & Neck Lymphatics: Bilateral - Description - Normal. Axillary  General Axillary Region: Bilateral - Description - Normal. Tenderness - Non Tender. Femoral & Inguinal  Generalized Femoral & Inguinal Lymphatics: Bilateral - Description - Normal. Tenderness - Non Tender.      Assessment & Plan (Jemarcus Dougal A. [name] MD; 11/15/2017 11:30 AM) BREAST CANCER METASTASIZED TO AXILLARY LYMPH NODE, LEFT  (C50.912) Impression: I had a long discussion with the patient and her family. She still will set proceed with a mastectomy and a targeted left axillary  lymph node dissection. If she was to undergo just a partial mastectomy, it would be a central mastectomy with removal of the nipple areolar complex given the inversion of the nipple. She is interested in mastectomy without reconstruction. We would try to do this with a targeted lymph node dissection. I discussed risks of surgery which includes but is not limited to bleeding, infection, need for other procedures, drain placement, cardiopulmonary issues, etc. She understands and surgery will be scheduled for the second week in April

## 2017-12-06 ENCOUNTER — Encounter (HOSPITAL_COMMUNITY): Payer: Self-pay | Admitting: *Deleted

## 2017-12-06 ENCOUNTER — Ambulatory Visit (HOSPITAL_COMMUNITY): Payer: Medicare Other | Admitting: Emergency Medicine

## 2017-12-06 ENCOUNTER — Encounter (HOSPITAL_COMMUNITY)
Admission: RE | Disposition: A | Discharge status: Home / Self Care | Payer: Self-pay | Source: Ambulatory Visit | Attending: Surgery

## 2017-12-06 ENCOUNTER — Observation Stay (HOSPITAL_COMMUNITY)
Admission: RE | Admit: 2017-12-06 | Discharge: 2017-12-07 | Disposition: A | Discharge status: Home / Self Care | Payer: Medicare Other | Source: Ambulatory Visit | Attending: Surgery | Admitting: Surgery

## 2017-12-06 ENCOUNTER — Ambulatory Visit
Admit: 2017-12-06 | Discharge: 2017-12-06 | Disposition: A | Discharge status: Home / Self Care | Payer: Medicare Other | Attending: Surgery | Admitting: Surgery

## 2017-12-06 ENCOUNTER — Ambulatory Visit (HOSPITAL_COMMUNITY): Payer: Medicare Other

## 2017-12-06 ENCOUNTER — Ambulatory Visit
Admission: RE | Admit: 2017-12-06 | Discharge: 2017-12-06 | Disposition: A | Discharge status: Home / Self Care | Payer: Medicare Other | Source: Ambulatory Visit | Attending: Surgery | Admitting: Surgery

## 2017-12-06 ENCOUNTER — Encounter (HOSPITAL_COMMUNITY)
Admission: RE | Admit: 2017-12-06 | Discharge: 2017-12-06 | Disposition: A | Discharge status: Home / Self Care | Payer: Medicare Other | Source: Ambulatory Visit | Attending: Surgery | Admitting: Surgery

## 2017-12-06 DIAGNOSIS — Z6839 Body mass index (BMI) 39.0-39.9, adult: Secondary | ICD-10-CM | POA: Insufficient documentation

## 2017-12-06 DIAGNOSIS — C50912 Malignant neoplasm of unspecified site of left female breast: Principal | ICD-10-CM | POA: Insufficient documentation

## 2017-12-06 DIAGNOSIS — Z171 Estrogen receptor negative status [ER-]: Secondary | ICD-10-CM | POA: Insufficient documentation

## 2017-12-06 DIAGNOSIS — Z853 Personal history of malignant neoplasm of breast: Secondary | ICD-10-CM

## 2017-12-06 DIAGNOSIS — L821 Other seborrheic keratosis: Secondary | ICD-10-CM | POA: Diagnosis not present

## 2017-12-06 DIAGNOSIS — Z87891 Personal history of nicotine dependence: Secondary | ICD-10-CM | POA: Diagnosis not present

## 2017-12-06 DIAGNOSIS — D242 Benign neoplasm of left breast: Secondary | ICD-10-CM | POA: Diagnosis not present

## 2017-12-06 DIAGNOSIS — I10 Essential (primary) hypertension: Secondary | ICD-10-CM | POA: Insufficient documentation

## 2017-12-06 DIAGNOSIS — C50212 Malignant neoplasm of upper-inner quadrant of left female breast: Secondary | ICD-10-CM | POA: Diagnosis not present

## 2017-12-06 DIAGNOSIS — N6092 Unspecified benign mammary dysplasia of left breast: Secondary | ICD-10-CM | POA: Insufficient documentation

## 2017-12-06 DIAGNOSIS — D649 Anemia, unspecified: Secondary | ICD-10-CM | POA: Diagnosis not present

## 2017-12-06 DIAGNOSIS — Z86000 Personal history of in-situ neoplasm of breast: Secondary | ICD-10-CM | POA: Insufficient documentation

## 2017-12-06 DIAGNOSIS — Z79899 Other long term (current) drug therapy: Secondary | ICD-10-CM | POA: Insufficient documentation

## 2017-12-06 DIAGNOSIS — Z9221 Personal history of antineoplastic chemotherapy: Secondary | ICD-10-CM | POA: Insufficient documentation

## 2017-12-06 DIAGNOSIS — C773 Secondary and unspecified malignant neoplasm of axilla and upper limb lymph nodes: Secondary | ICD-10-CM | POA: Diagnosis not present

## 2017-12-06 DIAGNOSIS — G8918 Other acute postprocedural pain: Secondary | ICD-10-CM | POA: Diagnosis not present

## 2017-12-06 DIAGNOSIS — E669 Obesity, unspecified: Secondary | ICD-10-CM | POA: Insufficient documentation

## 2017-12-06 HISTORY — PX: MASTECTOMY WITH AXILLARY LYMPH NODE DISSECTION: SHX5661

## 2017-12-06 HISTORY — DX: Personal history of malignant neoplasm of breast: Z85.3

## 2017-12-06 SURGERY — MASTECTOMY WITH AXILLARY LYMPH NODE DISSECTION
Anesthesia: General | Site: Breast | Laterality: Left

## 2017-12-06 MED ORDER — CEFAZOLIN SODIUM-DEXTROSE 2-4 GM/100ML-% IV SOLN
INTRAVENOUS | Status: AC
  Filled 2017-12-06: qty 100

## 2017-12-06 MED ORDER — LACTATED RINGERS IV SOLN
INTRAVENOUS | Status: DC
  Administered 2017-12-06: 07:00:00 via INTRAVENOUS

## 2017-12-06 MED ORDER — ATORVASTATIN CALCIUM 10 MG PO TABS
10.0000 mg | ORAL_TABLET | Freq: Every day | ORAL | Status: DC
  Administered 2017-12-06: 10 mg via ORAL
  Filled 2017-12-06: qty 1

## 2017-12-06 MED ORDER — HYDRALAZINE HCL 25 MG PO TABS
12.5000 mg | ORAL_TABLET | Freq: Three times a day (TID) | ORAL | Status: DC
  Administered 2017-12-06 – 2017-12-07 (×3): 12.5 mg via ORAL
  Filled 2017-12-06 (×3): qty 1

## 2017-12-06 MED ORDER — ACETAMINOPHEN 500 MG PO TABS
1000.0000 mg | ORAL_TABLET | ORAL | Status: AC
  Administered 2017-12-06: 1000 mg via ORAL

## 2017-12-06 MED ORDER — SUGAMMADEX SODIUM 500 MG/5ML IV SOLN
INTRAVENOUS | Status: DC | PRN
  Administered 2017-12-06: 250 mg via INTRAVENOUS

## 2017-12-06 MED ORDER — ACEBUTOLOL HCL 200 MG PO CAPS
200.0000 mg | ORAL_CAPSULE | Freq: Two times a day (BID) | ORAL | Status: DC
  Administered 2017-12-06 – 2017-12-07 (×2): 200 mg via ORAL
  Filled 2017-12-06 (×2): qty 1

## 2017-12-06 MED ORDER — FENTANYL CITRATE (PF) 250 MCG/5ML IJ SOLN
INTRAMUSCULAR | Status: DC | PRN
  Administered 2017-12-06: 100 ug via INTRAVENOUS

## 2017-12-06 MED ORDER — DEXAMETHASONE SODIUM PHOSPHATE 10 MG/ML IJ SOLN
INTRAMUSCULAR | Status: AC
  Filled 2017-12-06: qty 1

## 2017-12-06 MED ORDER — ROCURONIUM BROMIDE 10 MG/ML (PF) SYRINGE
PREFILLED_SYRINGE | INTRAVENOUS | Status: AC
  Filled 2017-12-06: qty 5

## 2017-12-06 MED ORDER — ONDANSETRON 4 MG PO TBDP
4.0000 mg | ORAL_TABLET | Freq: Four times a day (QID) | ORAL | Status: DC | PRN
Start: 2017-12-06 — End: 2017-12-07

## 2017-12-06 MED ORDER — OLMESARTAN MEDOXOMIL-HCTZ 40-25 MG PO TABS: 1.0000 | ORAL_TABLET | Freq: Every day | ORAL | Status: DC

## 2017-12-06 MED ORDER — ENOXAPARIN SODIUM 40 MG/0.4ML ~~LOC~~ SOLN
40.0000 mg | SUBCUTANEOUS | Status: DC
  Administered 2017-12-07: 40 mg via SUBCUTANEOUS
  Filled 2017-12-06: qty 0.4

## 2017-12-06 MED ORDER — ROCURONIUM BROMIDE 10 MG/ML (PF) SYRINGE
PREFILLED_SYRINGE | INTRAVENOUS | Status: DC | PRN
  Administered 2017-12-06: 40 mg via INTRAVENOUS

## 2017-12-06 MED ORDER — CLONIDINE HCL 0.1 MG PO TABS
0.1000 mg | ORAL_TABLET | Freq: Two times a day (BID) | ORAL | Status: DC
  Administered 2017-12-06 – 2017-12-07 (×2): 0.1 mg via ORAL
  Filled 2017-12-06 (×2): qty 1

## 2017-12-06 MED ORDER — FENTANYL CITRATE (PF) 100 MCG/2ML IJ SOLN
75.0000 ug | Freq: Once | INTRAMUSCULAR | Status: AC
  Administered 2017-12-06: 75 ug via INTRAVENOUS

## 2017-12-06 MED ORDER — LACTATED RINGERS IV SOLN
INTRAVENOUS | Status: DC | PRN
  Administered 2017-12-06: 07:00:00 via INTRAVENOUS

## 2017-12-06 MED ORDER — IRBESARTAN 300 MG PO TABS
300.0000 mg | ORAL_TABLET | Freq: Every day | ORAL | Status: DC
  Administered 2017-12-07: 300 mg via ORAL
  Filled 2017-12-06: qty 1

## 2017-12-06 MED ORDER — SUGAMMADEX SODIUM 500 MG/5ML IV SOLN
INTRAVENOUS | Status: AC
  Filled 2017-12-06: qty 5

## 2017-12-06 MED ORDER — PROPOFOL 10 MG/ML IV BOLUS
INTRAVENOUS | Status: DC | PRN
  Administered 2017-12-06: 150 mg via INTRAVENOUS

## 2017-12-06 MED ORDER — CEFAZOLIN SODIUM-DEXTROSE 2-4 GM/100ML-% IV SOLN
2.0000 g | INTRAVENOUS | Status: AC
  Administered 2017-12-06: 2 g via INTRAVENOUS

## 2017-12-06 MED ORDER — FENTANYL CITRATE (PF) 250 MCG/5ML IJ SOLN
INTRAMUSCULAR | Status: AC
  Filled 2017-12-06: qty 5

## 2017-12-06 MED ORDER — DEXAMETHASONE SODIUM PHOSPHATE 10 MG/ML IJ SOLN
INTRAMUSCULAR | Status: DC | PRN
  Administered 2017-12-06: 10 mg via INTRAVENOUS

## 2017-12-06 MED ORDER — GABAPENTIN 300 MG PO CAPS: 300.0000 mg | ORAL_CAPSULE | ORAL | Status: DC

## 2017-12-06 MED ORDER — ESCITALOPRAM OXALATE 10 MG PO TABS
5.0000 mg | ORAL_TABLET | Freq: Every day | ORAL | Status: DC
  Administered 2017-12-07: 5 mg via ORAL
  Filled 2017-12-06: qty 1

## 2017-12-06 MED ORDER — LIDOCAINE 2% (20 MG/ML) 5 ML SYRINGE
INTRAMUSCULAR | Status: AC
  Filled 2017-12-06: qty 5

## 2017-12-06 MED ORDER — CHLORHEXIDINE GLUCONATE CLOTH 2 % EX PADS: 6.0000 | MEDICATED_PAD | Freq: Once | CUTANEOUS | Status: DC

## 2017-12-06 MED ORDER — BUPIVACAINE HCL (PF) 0.25 % IJ SOLN
INTRAMUSCULAR | Status: AC
  Filled 2017-12-06: qty 30

## 2017-12-06 MED ORDER — OXYCODONE HCL 5 MG PO TABS: 5.0000 mg | ORAL_TABLET | ORAL | Status: DC | PRN

## 2017-12-06 MED ORDER — TECHNETIUM TC 99M SULFUR COLLOID FILTERED
1.0000 | Freq: Once | INTRAVENOUS | Status: AC | PRN
  Administered 2017-12-06: 1 via INTRADERMAL

## 2017-12-06 MED ORDER — LIDOCAINE 2% (20 MG/ML) 5 ML SYRINGE
INTRAMUSCULAR | Status: DC | PRN
  Administered 2017-12-06: 80 mg via INTRAVENOUS

## 2017-12-06 MED ORDER — FENTANYL CITRATE (PF) 100 MCG/2ML IJ SOLN
INTRAMUSCULAR | Status: AC
  Filled 2017-12-06: qty 2

## 2017-12-06 MED ORDER — EPHEDRINE SULFATE-NACL 50-0.9 MG/10ML-% IV SOSY
PREFILLED_SYRINGE | INTRAVENOUS | Status: DC | PRN
  Administered 2017-12-06 (×3): 10 mg via INTRAVENOUS
  Administered 2017-12-06: 5 mg via INTRAVENOUS

## 2017-12-06 MED ORDER — TRAMADOL HCL 50 MG PO TABS
50.0000 mg | ORAL_TABLET | Freq: Four times a day (QID) | ORAL | Status: DC | PRN
  Administered 2017-12-07: 50 mg via ORAL
  Filled 2017-12-06: qty 1

## 2017-12-06 MED ORDER — ONDANSETRON HCL 4 MG/2ML IJ SOLN: 4.0000 mg | Freq: Four times a day (QID) | INTRAMUSCULAR | Status: DC | PRN

## 2017-12-06 MED ORDER — ONDANSETRON HCL 4 MG/2ML IJ SOLN
INTRAMUSCULAR | Status: AC
  Filled 2017-12-06: qty 2

## 2017-12-06 MED ORDER — GABAPENTIN 300 MG PO CAPS
ORAL_CAPSULE | ORAL | Status: AC
  Administered 2017-12-06: 300 mg
  Filled 2017-12-06: qty 1

## 2017-12-06 MED ORDER — FENTANYL CITRATE (PF) 100 MCG/2ML IJ SOLN: 25.0000 ug | INTRAMUSCULAR | Status: DC | PRN

## 2017-12-06 MED ORDER — EPHEDRINE 5 MG/ML INJ
INTRAVENOUS | Status: AC
  Filled 2017-12-06: qty 10

## 2017-12-06 MED ORDER — 0.9 % SODIUM CHLORIDE (POUR BTL) OPTIME
TOPICAL | Status: DC | PRN
  Administered 2017-12-06: 1000 mL

## 2017-12-06 MED ORDER — PROPOFOL 10 MG/ML IV BOLUS
INTRAVENOUS | Status: AC
Start: 2017-12-06 — End: 2017-12-06
  Filled 2017-12-06: qty 20

## 2017-12-06 MED ORDER — ACETAMINOPHEN 500 MG PO TABS
ORAL_TABLET | ORAL | Status: AC
  Filled 2017-12-06: qty 2

## 2017-12-06 MED ORDER — MORPHINE SULFATE (PF) 4 MG/ML IV SOLN: 1.0000 mg | INTRAVENOUS | Status: DC | PRN

## 2017-12-06 MED ORDER — ONDANSETRON HCL 4 MG/2ML IJ SOLN
INTRAMUSCULAR | Status: DC | PRN
  Administered 2017-12-06: 4 mg via INTRAVENOUS

## 2017-12-06 MED ORDER — POTASSIUM CHLORIDE IN NACL 20-0.9 MEQ/L-% IV SOLN
INTRAVENOUS | Status: DC
  Administered 2017-12-06 – 2017-12-07 (×2): via INTRAVENOUS
  Filled 2017-12-06 (×2): qty 1000

## 2017-12-06 MED ORDER — HYDROCHLOROTHIAZIDE 25 MG PO TABS
25.0000 mg | ORAL_TABLET | Freq: Every day | ORAL | Status: DC
  Administered 2017-12-07: 25 mg via ORAL
  Filled 2017-12-06: qty 1

## 2017-12-06 MED ORDER — BUPIVACAINE-EPINEPHRINE (PF) 0.5% -1:200000 IJ SOLN
INTRAMUSCULAR | Status: DC | PRN
  Administered 2017-12-06: 30 mL via PERINEURAL

## 2017-12-06 MED ORDER — ONDANSETRON HCL 4 MG/2ML IJ SOLN: 4.0000 mg | Freq: Once | INTRAMUSCULAR | Status: DC | PRN

## 2017-12-06 SURGICAL SUPPLY — 53 items
ADH SKN CLS APL DERMABOND .7 (GAUZE/BANDAGES/DRESSINGS) ×2
APPLIER CLIP 9.375 MED OPEN (MISCELLANEOUS) ×3
APR CLP MED 9.3 20 MLT OPN (MISCELLANEOUS) ×1
BINDER BREAST LRG (GAUZE/BANDAGES/DRESSINGS) IMPLANT
BINDER BREAST XLRG (GAUZE/BANDAGES/DRESSINGS) IMPLANT
BINDER BREAST XXLRG (GAUZE/BANDAGES/DRESSINGS) ×3 IMPLANT
BIOPATCH RED 1 DISK 7.0 (GAUZE/BANDAGES/DRESSINGS) ×2 IMPLANT
BIOPATCH RED 1IN DISK 7.0MM (GAUZE/BANDAGES/DRESSINGS) ×1
CANISTER SUCT 3000ML PPV (MISCELLANEOUS) ×3 IMPLANT
CHLORAPREP W/TINT 26ML (MISCELLANEOUS) ×3 IMPLANT
CLIP APPLIE 9.375 MED OPEN (MISCELLANEOUS) ×1 IMPLANT
CONT SPEC 4OZ CLIKSEAL STRL BL (MISCELLANEOUS) ×9 IMPLANT
COVER PROBE W GEL 5X96 (DRAPES) ×3 IMPLANT
COVER SURGICAL LIGHT HANDLE (MISCELLANEOUS) ×3 IMPLANT
DERMABOND ADVANCED (GAUZE/BANDAGES/DRESSINGS) ×4
DERMABOND ADVANCED .7 DNX12 (GAUZE/BANDAGES/DRESSINGS) ×2 IMPLANT
DEVICE DUBIN SPECIMEN MAMMOGRA (MISCELLANEOUS) ×3 IMPLANT
DRAIN CHANNEL 19F RND (DRAIN) ×3 IMPLANT
DRAPE LAPAROSCOPIC ABDOMINAL (DRAPES) ×3 IMPLANT
DRAPE UTILITY XL STRL (DRAPES) IMPLANT
DRSG TEGADERM 2-3/8X2-3/4 SM (GAUZE/BANDAGES/DRESSINGS) ×3 IMPLANT
ELECT REM PT RETURN 9FT ADLT (ELECTROSURGICAL) ×3
ELECTRODE REM PT RTRN 9FT ADLT (ELECTROSURGICAL) ×1 IMPLANT
EVACUATOR SILICONE 100CC (DRAIN) ×3 IMPLANT
FILTER ARTHROSCOPY CONVERTOR (FILTER) ×3 IMPLANT
GAUZE SPONGE 4X4 12PLY STRL (GAUZE/BANDAGES/DRESSINGS) ×3 IMPLANT
GLOVE BIOGEL PI IND STRL 6.5 (GLOVE) ×1 IMPLANT
GLOVE BIOGEL PI IND STRL 7.0 (GLOVE) ×1 IMPLANT
GLOVE BIOGEL PI IND STRL 8 (GLOVE) ×1 IMPLANT
GLOVE BIOGEL PI INDICATOR 6.5 (GLOVE) ×2
GLOVE BIOGEL PI INDICATOR 7.0 (GLOVE) ×2
GLOVE BIOGEL PI INDICATOR 8 (GLOVE) ×2
GLOVE SURG SIGNA 7.5 PF LTX (GLOVE) ×3 IMPLANT
GLOVE SURG SS PI 6.0 STRL IVOR (GLOVE) ×3 IMPLANT
GLOVE SURG SS PI 6.5 STRL IVOR (GLOVE) ×3 IMPLANT
GOWN STRL REUS W/ TWL LRG LVL3 (GOWN DISPOSABLE) ×2 IMPLANT
GOWN STRL REUS W/ TWL XL LVL3 (GOWN DISPOSABLE) ×1 IMPLANT
GOWN STRL REUS W/TWL LRG LVL3 (GOWN DISPOSABLE) ×6
GOWN STRL REUS W/TWL XL LVL3 (GOWN DISPOSABLE) ×3
KIT BASIN OR (CUSTOM PROCEDURE TRAY) ×3 IMPLANT
KIT TURNOVER KIT B (KITS) ×3 IMPLANT
NS IRRIG 1000ML POUR BTL (IV SOLUTION) ×3 IMPLANT
PACK GENERAL/GYN (CUSTOM PROCEDURE TRAY) ×3 IMPLANT
PAD ARMBOARD 7.5X6 YLW CONV (MISCELLANEOUS) ×3 IMPLANT
SPECIMEN JAR X LARGE (MISCELLANEOUS) ×3 IMPLANT
STAPLER VISISTAT 35W (STAPLE) ×3 IMPLANT
SUT ETHILON 3 0 FSL (SUTURE) ×3 IMPLANT
SUT MON AB 4-0 PC3 18 (SUTURE) ×3 IMPLANT
SUT VIC AB 3-0 SH 18 (SUTURE) ×6 IMPLANT
SUT VIC AB 3-0 SH 27 (SUTURE) ×3
SUT VIC AB 3-0 SH 27XBRD (SUTURE) ×1 IMPLANT
TOWEL OR 17X24 6PK STRL BLUE (TOWEL DISPOSABLE) ×3 IMPLANT
TOWEL OR 17X26 10 PK STRL BLUE (TOWEL DISPOSABLE) ×3 IMPLANT

## 2017-12-06 NOTE — Anesthesia Procedure Notes (Signed)
Procedure Name: Intubation Date/Time: 12/06/2017 8:34 AM Performed by: Harden Mo, CRNA Pre-anesthesia Checklist: Patient identified, Emergency Drugs available, Suction available and Patient being monitored Patient Re-evaluated:Patient Re-evaluated prior to induction Oxygen Delivery Method: Circle System Utilized Preoxygenation: Pre-oxygenation with 100% oxygen Induction Type: IV induction Ventilation: Mask ventilation without difficulty Laryngoscope Size: Mac and 3 Grade View: Grade I Tube type: Oral Tube size: 7.0 mm Number of attempts: 1 Airway Equipment and Method: Stylet and Oral airway Placement Confirmation: ETT inserted through vocal cords under direct vision,  positive ETCO2 and breath sounds checked- equal and bilateral Secured at: 22 cm Tube secured with: Tape Dental Injury: Teeth and Oropharynx as per pre-operative assessment  Comments: Intubated without difficulties by Anette Hufham SRNA

## 2017-12-06 NOTE — Transfer of Care (Signed)
Immediate Anesthesia Transfer of Care Note  Patient: Lindsay [name]  Procedure(s) Performed: LEFT MASTECTOMY WITH TARGETED LYMPH NODE DISSECTION (Left Breast)  Patient Location: PACU  Anesthesia Type:General and Regional  Level of Consciousness: awake and alert   Airway & Oxygen Therapy: Patient Spontanous Breathing and Patient connected to face mask oxygen  Post-op Assessment: Report given to RN, Post -op Vital signs reviewed and stable and Patient moving all extremities X 4  Post vital signs: Reviewed and stable  Last Vitals:  Vitals Value Taken Time  BP 146/59 12/06/2017 10:02 AM  Temp    Pulse 60 12/06/2017 10:03 AM  Resp 14 12/06/2017 10:03 AM  SpO2 100 % 12/06/2017 10:03 AM  Vitals shown include unvalidated device data.  Last Pain:  Vitals:   12/06/17 1001  TempSrc:   PainSc: (P) Asleep      Patients Stated Pain Goal: 3 (92/01/00 7121)  Complications: No apparent anesthesia complications

## 2017-12-06 NOTE — Interval H&P Note (Signed)
History and Physical Interval Note: no change in H and P  12/06/2017 7:07 AM  Lindsay [name]  has presented today for surgery, with the diagnosis of left breast cancer  The various methods of treatment have been discussed with the patient and family. After consideration of risks, benefits and other options for treatment, the patient has consented to  Procedure(s): LEFT MASTECTOMY WITH TARGETED LYMPH NODE DISSECTION (Left) as a surgical intervention .  The patient's history has been reviewed, patient examined, no change in status, stable for surgery.  I have reviewed the patient's chart and labs.  Questions were answered to the patient's satisfaction.     Marcio Hoque A

## 2017-12-06 NOTE — Progress Notes (Signed)
1200 Received patient from PACU. Pt alert, oriented x4. No complaints of pain. Dressing clean, dry, intact. Will continue to monitor.

## 2017-12-06 NOTE — Op Note (Signed)
LEFT MASTECTOMY WITH TARGETED LYMPH NODE DISSECTION  Procedure Note  Lindsay [name] 12/06/2017   Pre-op Diagnosis: left breast cancer     Post-op Diagnosis: same  Procedure(s): LEFT MASTECTOMY WITH TARGETED DEEP AXILLARY LYMPH NODE DISSECTION  Surgeon(s): Lindsay Keens, MD  Anesthesia: General  Staff:  Circulator: Lindsay Lathe, RN Relief Scrub: Teschner, Lindsay [name], CST Scrub Person: Lindsay [name] Circulator Assistant: Lindsay Sack, RN; Lindsay Downs, RN; Lindsay Johns, RN  Estimated Blood Loss: Minimal               Specimens: sent to path  Indications: This is [name] 72 year old female who presented late last year with [name] triple negative left breast cancer.  She was also found to be node positive on stereotactic biopsy.  She has been undergoing neoadjuvant chemotherapy.  She now presents for [name] left mastectomy and targeted left axillary lymph node dissection.  Findings: 2 separate radioactive seeds were placed in the left axilla.  Both of the lymph nodes containing the seeds were excised.  Several other sentinel lymph nodes were removed as well from the left axilla and sent separately.  Procedure: The patient was identified in the holding area.  She had already had radioactive seeds placed in the left axilla for targeting purposes.  In the holding area, the radiation technologist also injected reactivity around the left breast areola.  She was then taken to the operating room.  She was placed supine on the operating room table and general anesthesia was induced.  Her left breast and axilla and chest were prepped and draped in the usual sterile fashion.  I made [name] an elliptical incision transversely across the chest incorporating an old scar on the medial breast as well as the nipple areolar complex.  I then dissected down to the breast tissue circumferentially with the cautery.  I next performed the superior skin flap staying just underneath the skin and dermis with the  electrocautery moving toward the clavicle.  I then dissected the inferior skin flap going down to the inframammary ridge.  Then with the cautery, I excised the breast tissue off of the pectoralis muscle with the electrocautery dissecting medial to lateral.  I took the dissection all the way past the pectoralis muscles.  With the neoprobe, I then identified the 2 targeted lymph nodes in the axilla.  These were excised separately.  Each 1 was x-rayed confirming that the radioactive seed was in the specimen as well as the tissue marker and the original biopsied lymph node.  These were then sent to pathology.  I then used the neoprobe to identify at least 3 other axillary lymph nodes.  These were all removed and sent to pathology as well.  I palpated the axilla and found no other enlarged lymph nodes.  We then thoroughly irrigated the axilla and chest wall with saline.  I inspected the flaps which were viable.  We thoroughly examined the incision and wounds and hemostasis appeared to be achieved.  I then made [name] separate skin incision and placed [name] 19 [place] Blake drain into the axilla and underneath the mastectomy site.  This was sewn in place with [name] nylon suture.  I then closed the patient subcutaneous tissue with interrupted 3-0 Vicryl sutures and closed the skin with [name] running 4-0 Monocryl.  Dermabond was then applied.  The patient was then placed in [name] breast binder.  The patient tolerated the procedure well.  All the counts were correct at the end of  the procedure.  The patient was then extubated in the operating room and taken in [name] stable condition to the recovery room.          Lindsay [name]   Date: 12/06/2017  Time: 9:58 AM

## 2017-12-06 NOTE — Anesthesia Preprocedure Evaluation (Addendum)
Anesthesia Evaluation  Patient identified by MRN, date of birth, ID band Patient awake    Reviewed: Allergy & Precautions, NPO status , Patient's Chart, lab work & pertinent test results, reviewed documented beta blocker date and time   Airway Mallampati: II  TM Distance: >3 FB Neck ROM: Full    Dental  (+) Teeth Intact, Dental Advisory Given   Pulmonary former smoker,    Pulmonary exam normal breath sounds clear to auscultation       Cardiovascular hypertension, Pt. on home beta blockers and Pt. on medications Normal cardiovascular exam+ dysrhythmias  Rhythm:Regular Rate:Normal  Echo 06/23/17: - Left ventricle: The cavity size was normal. Wall thickness wasincreased in a pattern of moderate LVH. Systolic function wasnormal. The estimated ejection fraction was in the range of 60%to 65%. Wall motion was normal; there were no regional wallmotion abnormalities. Left ventricular diastolic functionparameters were normal. - Mitral valve: Calcified annulus. Mildly thickened leaflets . - Left atrium: The atrium was mildly dilated. - Atrial septum: There was increased thickness of the septum,consistent with lipomatous hypertrophy. No defect or patentforamen ovale was identified. - Impressions: Normal GLS - 18.6     Neuro/Psych negative neurological ROS  negative psych ROS   GI/Hepatic negative GI ROS, Neg liver ROS,   Endo/Other  Obesity   Renal/GU negative Renal ROS     Musculoskeletal negative musculoskeletal ROS (+)   Abdominal   Peds  Hematology  (+) Blood dyscrasia (Thrombocytopenia), anemia ,   Anesthesia Other Findings Day of surgery medications reviewed with the patient.  Left breast cancer  Reproductive/Obstetrics                            Anesthesia Physical Anesthesia Plan  ASA: III  Anesthesia Plan: General   Post-op Pain Management:  Regional for Post-op pain    Induction: Intravenous  PONV Risk Score and Plan: 3 and Dexamethasone and Ondansetron  Airway Management Planned: Oral ETT  Additional Equipment:   Intra-op Plan:   Post-operative Plan: Extubation in OR  Informed Consent: I have reviewed the patients History and Physical, chart, labs and discussed the procedure including the risks, benefits and alternatives for the proposed anesthesia with the patient or authorized representative who has indicated his/her understanding and acceptance.   Dental advisory given  Plan Discussed with: CRNA  Anesthesia Plan Comments: (Risks/benefits of general anesthesia discussed with patient including risk of damage to teeth, lips, gum, and tongue, nausea/vomiting, allergic reactions to medications, and the possibility of heart attack, stroke and death.  All patient questions answered.  Patient wishes to proceed.)       Anesthesia Quick Evaluation

## 2017-12-06 NOTE — Anesthesia Postprocedure Evaluation (Signed)
Anesthesia Post Note  Patient: Lindsay [name]  Procedure(s) Performed: LEFT MASTECTOMY WITH TARGETED LYMPH NODE DISSECTION (Left Breast)     Patient location during evaluation: PACU Anesthesia Type: General Level of consciousness: awake and alert Pain management: pain level controlled Vital Signs Assessment: post-procedure vital signs reviewed and stable Respiratory status: spontaneous breathing, nonlabored ventilation and respiratory function stable Cardiovascular status: blood pressure returned to baseline and stable Postop Assessment: no apparent nausea or vomiting Anesthetic complications: no    Last Vitals:  Vitals:   12/06/17 1057 12/06/17 1200  BP:  (!) 159/61  Pulse: (!) 56 (!) 57  Resp: 12 14  Temp: 36.6 C 36.6 C  SpO2: 96% 94%    Last Pain:  Vitals:   12/06/17 1200  TempSrc: Oral  PainSc:                  Catalina Gravel

## 2017-12-06 NOTE — Anesthesia Procedure Notes (Signed)
Anesthesia Regional Block: Pectoralis block   Pre-Anesthetic Checklist: ,, timeout performed, Correct Patient, Correct Site, Correct Laterality, Correct Procedure, Correct Position, site marked, Risks and benefits discussed,  Surgical consent,  Pre-op evaluation,  At surgeon's request and post-op pain management  Laterality: Left  Prep: chloraprep       Needles:  Injection technique: Single-shot  Needle Type: Echogenic Needle     Needle Length: 9cm  Needle Gauge: 21     Additional Needles:   Procedures:,,,, ultrasound used (permanent image in chart),,,,  Narrative:  Start time: 12/06/2017 8:01 AM End time: 12/06/2017 8:06 AM Injection made incrementally with aspirations every 5 mL.  Performed by: Personally  Anesthesiologist: Catalina Gravel, MD  Additional Notes: No pain on injection. No increased resistance to injection. Injection made in 5cc increments.  Good needle visualization.  Patient tolerated procedure well.

## 2017-12-07 ENCOUNTER — Encounter (HOSPITAL_COMMUNITY): Payer: Self-pay | Admitting: Surgery

## 2017-12-07 DIAGNOSIS — D649 Anemia, unspecified: Secondary | ICD-10-CM | POA: Diagnosis not present

## 2017-12-07 DIAGNOSIS — N6092 Unspecified benign mammary dysplasia of left breast: Secondary | ICD-10-CM | POA: Diagnosis not present

## 2017-12-07 DIAGNOSIS — C50912 Malignant neoplasm of unspecified site of left female breast: Secondary | ICD-10-CM | POA: Diagnosis not present

## 2017-12-07 DIAGNOSIS — Z9221 Personal history of antineoplastic chemotherapy: Secondary | ICD-10-CM | POA: Diagnosis not present

## 2017-12-07 DIAGNOSIS — C773 Secondary and unspecified malignant neoplasm of axilla and upper limb lymph nodes: Secondary | ICD-10-CM | POA: Diagnosis not present

## 2017-12-07 DIAGNOSIS — Z87891 Personal history of nicotine dependence: Secondary | ICD-10-CM | POA: Diagnosis not present

## 2017-12-07 DIAGNOSIS — Z86000 Personal history of in-situ neoplasm of breast: Secondary | ICD-10-CM | POA: Diagnosis not present

## 2017-12-07 DIAGNOSIS — Z171 Estrogen receptor negative status [ER-]: Secondary | ICD-10-CM | POA: Diagnosis not present

## 2017-12-07 DIAGNOSIS — Z79899 Other long term (current) drug therapy: Secondary | ICD-10-CM | POA: Diagnosis not present

## 2017-12-07 DIAGNOSIS — L821 Other seborrheic keratosis: Secondary | ICD-10-CM | POA: Diagnosis not present

## 2017-12-07 DIAGNOSIS — I10 Essential (primary) hypertension: Secondary | ICD-10-CM | POA: Diagnosis not present

## 2017-12-07 LAB — BASIC METABOLIC PANEL
Anion gap: 12 (ref 5–15)
BUN: 15 mg/dL (ref 6–20)
CHLORIDE: 104 mmol/L (ref 101–111)
CO2: 25 mmol/L (ref 22–32)
Calcium: 8.9 mg/dL (ref 8.9–10.3)
Creatinine, Ser: 1.03 mg/dL — ABNORMAL HIGH (ref 0.44–1.00)
GFR calc non Af Amer: 52 mL/min — ABNORMAL LOW (ref >60)
Glucose, Bld: 109 mg/dL — ABNORMAL HIGH (ref 65–99)
POTASSIUM: 4.2 mmol/L (ref 3.5–5.1)
SODIUM: 141 mmol/L (ref 135–145)

## 2017-12-07 LAB — CBC
HEMATOCRIT: 30 % — AB (ref 36.0–46.0)
HEMOGLOBIN: 9.6 g/dL — AB (ref 12.0–15.0)
MCH: 32.5 pg (ref 26.0–34.0)
MCHC: 32 g/dL (ref 30.0–36.0)
MCV: 101.7 fL — AB (ref 78.0–100.0)
PLATELETS: 196 10*3/uL (ref 150–400)
RBC: 2.95 MIL/uL — AB (ref 3.87–5.11)
RDW: 19 % — ABNORMAL HIGH (ref 11.5–15.5)
WBC: 10.1 10*3/uL (ref 4.0–10.5)

## 2017-12-07 MED ORDER — OXYCODONE HCL 5 MG PO TABS: 5.0000 mg | ORAL_TABLET | Freq: Four times a day (QID) | ORAL | 0 refills | Status: DC | PRN

## 2017-12-07 NOTE — Progress Notes (Signed)
Patient ID: Lindsay [name], female   DOB: 1945/09/10, 72 y.o.   MRN: [id number]   Doing well D/c home today

## 2017-12-07 NOTE — Discharge Instructions (Signed)
[facility] °[phone] ° °MASTECTOMY: POST OP INSTRUCTIONS ° °Always review your discharge instruction sheet given to you by the facility where your surgery was performed. °IF YOU HAVE DISABILITY OR FAMILY LEAVE FORMS, YOU MUST BRING THEM TO THE OFFICE FOR PROCESSING.   °DO NOT GIVE THEM TO YOUR DOCTOR. °A prescription for pain medication may be given to you upon discharge.  Take your pain medication as prescribed, if needed.  If narcotic pain medicine is not needed, then you may take acetaminophen (Tylenol) or ibuprofen (Advil) as needed. °1. Take your usually prescribed medications unless otherwise directed. °2. If you need a refill on your pain medication, please contact your pharmacy.  They will contact our office to request authorization.  Prescriptions will not be filled after 5pm or on week-ends. °3. You should follow a light diet the first few days after arrival home, such as soup and crackers, etc.  Resume your normal diet the day after surgery. °4. Most patients will experience some swelling and bruising on the chest and underarm.  Ice packs will help.  Swelling and bruising can take several days to resolve.  °5. It is common to experience some constipation if taking pain medication after surgery.  Increasing fluid intake and taking a stool softener (such as Colace) will usually help or prevent this problem from occurring.  A mild laxative (Milk of Magnesia or Miralax) should be taken according to package instructions if there are no bowel movements after 48 hours. °6. Unless discharge instructions indicate otherwise, leave your bandage dry and in place until your next appointment in 3-5 days.  You may take a limited sponge bath.  No tube baths or showers until the drains are removed.  You may have steri-strips (small skin tapes) in place directly over the incision.  These strips should be left on the skin for 7-10 days.  If your surgeon used skin glue on the incision, you may  shower in 24 hours.  The glue will flake off over the next 2-3 weeks.  Any sutures or staples will be removed at the office during your follow-up visit. °7. DRAINS:  If you have drains in place, it is important to keep a list of the amount of drainage produced each day in your drains.  Before leaving the hospital, you should be instructed on drain care.  Call our office if you have any questions about your drains. °8. ACTIVITIES:  You may resume regular (light) daily activities beginning the next day--such as daily self-care, walking, climbing stairs--gradually increasing activities as tolerated.  You may have sexual intercourse when it is comfortable.  Refrain from any heavy lifting or straining until approved by your doctor. °a. You may drive when you are no longer taking prescription pain medication, you can comfortably wear a seatbelt, and you can safely maneuver your car and apply brakes. °b. RETURN TO WORK:  __________________________________________________________ °9. You should see your doctor in the office for a follow-up appointment approximately 3-5 days after your surgery.  Your doctor’s nurse will typically make your follow-up appointment when she calls you with your pathology report.  Expect your pathology report 2-3 business days after your surgery.  You may call to check if you do not hear from us after three days.   °10. OTHER INSTRUCTIONS: ______________________________________________________________________________________________ ____________________________________________________________________________________________ °WHEN TO CALL YOUR DOCTOR: °1. Fever over 101.0 °2. Nausea and/or vomiting °3. Extreme swelling or bruising °4. Continued bleeding from incision. °5. Increased pain, redness, or drainage from the incision. °  The clinic staff is available to answer your questions during regular business hours.  Please don’t hesitate to call and ask to speak to one of the nurses for clinical  concerns.  If you have a medical emergency, go to the nearest emergency room or call 911.  A surgeon from [facility] is always on call at the hospital. °[address], [address], [place], [place]  [address] ? [address] [address], [place], [place]   [address] °[phone] ? [phone] ? FAX [phone] °Web site: [email or web address] °

## 2017-12-07 NOTE — Progress Notes (Signed)
1135 Patient discharged to home. Verbalizes understanding of all discharge instructions including incision care, discharge medications, and follow up MD visits. Patient accompanied by husband.

## 2017-12-07 NOTE — Discharge Summary (Signed)
Physician Discharge Summary  Patient ID: Lindsay [name] MRN: [id number] DOB/AGE: 1945-12-25 72 y.o.  Admit date: 12/06/2017 Discharge date: 12/07/2017  Admission Diagnoses:  Discharge Diagnoses:  Active Problems:   History of left breast cancer   Discharged Condition: good  Hospital Course: uneventful post op recovery.  Discharged home pod #1  Consults: None  Significant Diagnostic Studies:   Treatments: surgery: left mastectomy with targeted lymph node dissection  Discharge Exam: Blood pressure 137/60, pulse 66, temperature 98.2 F (36.8 C), temperature source Oral, resp. rate 16, height 5\' 7"  (1.702 m), weight 114.3 kg (251 lb 15.8 oz), SpO2 95 %. General appearance: alert, cooperative and no distress Resp: clear to auscultation bilaterally Cardio: regular rate and rhythm, S1, S2 normal, no murmur, click, rub or gallop Incision/Wound:chest wound clean, flaps viable, no hematoma, drain serosangenous  Disposition: Discharge disposition: 01-Home or Self Care        Allergies as of 12/07/2017   No Known Allergies     Medication List    TAKE these medications   acebutolol 200 MG capsule Commonly known as:  SECTRAL Take 200 mg by mouth 2 (two) times daily.   ALLERGY EYE DROPS OP Place 1 drop into both eyes daily as needed (allergies).   atorvastatin 10 MG tablet Commonly known as:  LIPITOR Take 10 mg by mouth at bedtime.   calcium carbonate 500 MG chewable tablet Commonly known as:  TUMS - dosed in mg elemental calcium Chew 1 tablet by mouth daily as needed for indigestion or heartburn.   calcium carbonate 600 MG Tabs tablet Commonly known as:  OS-CAL Take 1,200 mg by mouth daily.   cloNIDine 0.1 MG tablet Commonly known as:  CATAPRES Take 0.1 mg by mouth 2 (two) times daily.   escitalopram 5 MG tablet Commonly known as:  LEXAPRO Take 5 mg by mouth daily.   Fish Oil 1200 MG Caps Take 1,200 mg by mouth 2 (two) times daily.   hydrALAZINE 25 MG  tablet Commonly known as:  APRESOLINE Take 12.5 mg by mouth 3 (three) times daily.   lidocaine-prilocaine cream Commonly known as:  EMLA Apply 1 application topically as needed. What changed:  reasons to take this   loratadine 10 MG tablet Commonly known as:  CLARITIN Take 10 mg by mouth daily as needed for allergies.   LORazepam 1 MG tablet Commonly known as:  ATIVAN Take 1 tablet (1 mg total) by mouth once as needed for anxiety. Take 1 tab 1 hour before MRI scan   magnesium oxide 400 MG tablet Commonly known as:  MAG-OX Take 400 mg by mouth daily.   naproxen sodium 220 MG tablet Commonly known as:  ALEVE Take 220 mg by mouth daily as needed (pain).   olmesartan-hydrochlorothiazide 40-25 MG tablet Commonly known as:  BENICAR HCT Take 1 tablet by mouth daily.   ondansetron 8 MG tablet Commonly known as:  ZOFRAN Take 1 tablet (8 mg total) by mouth every 8 (eight) hours as needed for nausea or vomiting.   oxyCODONE 5 MG immediate release tablet Commonly known as:  Oxy IR/ROXICODONE Take 1-2 tablets (5-10 mg total) by mouth every 6 (six) hours as needed for moderate pain or severe pain.   PRESERVISION AREDS 2 Caps Take 1 capsule by mouth 2 (two) times daily.   prochlorperazine 10 MG tablet Commonly known as:  COMPAZINE Take 1 tablet (10 mg total) by mouth every 6 (six) hours as needed for nausea or vomiting.   vitamin B-12 1000  MCG tablet Commonly known as:  CYANOCOBALAMIN Take 1 tablet by mouth daily.   Vitamin D3 2000 units Tabs Take 2,000 Units by mouth daily.      Follow-up Information    Coralie Keens, MD Follow up on 12/27/2017.   Specialty:  General Surgery Contact information: [place] [address] [phone]           Signed: Harl Bowie 12/07/2017, 6:24 AM

## 2017-12-12 ENCOUNTER — Telehealth: Payer: Self-pay | Admitting: Hematology

## 2017-12-12 NOTE — Telephone Encounter (Signed)
Called and spoke with patient regarding appt per 4/15 sch msg

## 2017-12-26 NOTE — Progress Notes (Signed)
[place]  [phone] [phone]  Clinic Follow Up Note   Patient Care Team: Ronita Hipps, MD as PCP - General (Family Medicine) Coralie Keens, MD as Consulting Physician (General Surgery) Richardo Priest, MD as Consulting Physician (Cardiology)   Date of Service:  12/28/2017   CHIEF COMPLAINTS  F/u left breast cancer, triple negative, stage IIIB  ONCOLOGY HISTORY: Oncology History   Cancer Staging Malignant neoplasm of upper-inner quadrant of left breast in female, estrogen receptor negative ([place]) Staging form: Breast, AJCC 8th Edition - Clinical stage from 06/10/2017: Stage IIIB (cT2, cN1, cM0, G3, ER: Negative, PR: Negative, HER2: Negative) - Signed by Truitt Merle, MD on 06/21/2017 - Pathologic stage from 12/06/2017: No Stage Recommended (ypT0, pN0, cM0, GX, ER: Unknown, PR: Unknown, HER2: Not Assessed) - Signed by Truitt Merle, MD on 12/28/2017       Malignant neoplasm of upper-inner quadrant of left breast in female, estrogen receptor negative ([place])   06/08/2017 Imaging    DIAGNOSTIC MAMMO and [place] Left Breast 06/08/17 IMPRESSION:  Highly suspicious palpable left breast 11 o'clock mass which measures 2.8 cm in greatest dimension. Ductal extension anteriorly to the level of the nipple, and posteriorly for additional 1.8cm, is suspicious for tumor extension.       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 Wall thickness was   increased in a pattern of moderate LVH. Systolic function was   normal. The estimated ejection fraction was in the range of 60%   to 65%. Wall motion was normal; there were no regional wall   motion abnormalities. Left ventricular diastolic function   parameters were normal. - Mitral valve: Calcified annulus. Mildly thickened leaflets . - Left atrium: The atrium was mildly dilated. - Atrial septum: There was increased thickness of the septum,   consistent with lipomatous hypertrophy. No defect or patent   foramen ovale was identified. - Impressions: Normal GLS - 18.6 Impressions: - Normal GLS - 18.6      06/28/2017 Imaging    CT CAP W Contrast 06/28/17 IMPRESSION: 1. Mildly enlarged left axillary lymph node at 1.5 cm, a significant change from the prior chest CT from 2015, suspicious for left axillary metastatic disease. 2. Left upper breast lesion 2.1 cm in diameter with higher density margins and lower density center, favoring lumpectomy site. 3. Mildly enlarged right hilar lymph node, but stable compared back through 2015. 4. No findings of osseous metastatic disease or additional metastatic sites. 5. Other imaging findings of potential clinical significance: Aberrant right subclavian artery passes behind the esophagus. Aortic Atherosclerosis (ICD10-I70.0) and Emphysema (ICD10-J43.9). Coronary atherosclerosis. Mild nodular enlargement of the left inferior thyroid lobe, isodense to the rest of the thyroid. Airway thickening is present, suggesting bronchitis or reactive airways disease. Left hepatic lobe cyst. Left renal cyst. Likely small cysts in the right kidney.  Sigmoid colon diverticulosis.       06/28/2017 Imaging    Bone Scan 06/28/17 IMPRESSION: No evidence of osseous metastatic disease. Subtle increased radiotracer uptake within  the left chest wall, likely within the left breast.      06/30/2017 Surgery    Port placement by Dr. [name] on 06/30/17      07/01/2017 - 11/09/2017 Chemotherapy    Neoadjuvant Adriamycin and Cytoxan (AC) every 2 weeks for 4 cycles starting 07/01/17. Dose reduced starting with cycle 2. Completed AC on 12.19.18. Started weekly Carbo and Taxol every on 08/31/17.  Taxol changed to abraxane on 1/23 due to suspected taxol drug rash, carboplatin decreased with cycle 4 for mild thrombocytopenia. Due to cytopenia, reduced Carbo to 1.0 AUC starting with cycle 10.  Stopped after 11 cycles due to peripheral neuropathy.          07/23/2017 - 07/26/2017 Hospital Admission    Admit date: 07/23/17 Admission diagnosis: 07/26/17 Additional comments: febrile neutropenia; diverticulitis      08/29/2017 Mammogram    Diagnostic Mammogram and [place] 08/29/17  IMPRESSION:  Interval decrease in the size of known left 11 o'clock malignancy, with greatest dimension of 1.3cm.  Interval decrease in size of known left malignant axillary lymph node, with cortical thickness of 3 mm.        09/09/2017 Genetic Testing    The patient had genetic testing due to a personal history of breast cancer and skin cancer,  and a family history of breast cancer, pancreatic cancer, melanoma, and colon cancer.  The Common Hereditary Cancer Panel + Melanoma Panel + Basal Cell Nevus Syndrome Panel was ordered (58 genes).  The following genes were evaluated for sequence changes and exonic deletions/duplications: APC, ATM, AXIN2, BAP1, BARD1, BMPR1A, BRCA1, BRCA2, BRIP1, CDH1, CDK4, CDKN2A (p14ARF), CDKN2A (p16INK4a), CHEK2, CTNNA1, DICER1, EPCAM*, FANCC, GREM1*, KIT, MC1R, MEN1, MLH1, MSH2, MSH3, MSH6, MUTYH, NBN, NF1, PALB2, PALLD, PDGFRA, PMS2, POLD1, POLE, POT1, PTCH1, PTCH2, PTEN, RAD50, RAD51C, RAD51D, RB1, SDHB, SDHC, SDHD, SMAD4, SMARCA4, STK11, SUFU, TERT, TP53, TSC1, TSC2, VHL. The following genes were evaluated for sequence changes  only:HOXB13*, MITF*, NTHL1*, SDHA  Results: Negative, no pathogenic variants identified. The date of this test report is 09/09/2017.       12/06/2017 Pathology Results    Diagnosis 1. Lymph node, sentinel, biopsy, Left Axillary w/ seed - NO CARCINOMA IDENTIFIED IN ONE LYMPH NODE (0/1) - PREVIOUS BIOPSY SITE CHANGES PRESENT 2. Lymph node, sentinel, biopsy, Left Axillary #2 - FIBROVASCULAR AND ADIPOSE TISSUE - NO LYMPHOID TISSUE IDENTIFIED 3. Breast, simple mastectomy, Left - NO RESIDUAL CARCINOMA IDENTIFIED (STATUS POST NEOADJUVANT THERAPY) - ATYPICAL DUCTAL HYPERPLASIA AND FLAT EPITHELIAL ATYPIA WITH CALCIFICATIONS - FOCAL USUAL DUCTAL HYPERPLASIA, FIBROCYSTIC CHANGES AND DUCT ECTASIA - BENIGN INTRADUCTAL PAPILLOMA - SEBORRHEIC KERATOSIS - PREVIOUS BIOPSY SITE CHANGES PRESENT - SEE COMMENT 4. Lymph node, sentinel, biopsy, Left Axillary #3 - NO CARCINOMA IDENTIFIED IN ONE LYMPH NODE (0/1) 5. Lymph node, sentinel, biopsy, Left - NO CARCINOMA IDENTIFIED IN ONE LYMPH NODE (0/1) 6. Lymph node, sentinel, biopsy, Left - NO CARCINOMA IDENTIFIED IN ONE LYMPH NODE (0/1)      12/06/2017 Surgery    Left Breast Mastectomy with Dr. [name] and lymph node dissection       12/06/2017 Cancer Staging    Staging form: Breast, AJCC 8th Edition - Pathologic stage from 12/06/2017: No Stage Recommended (ypT0, pN0, cM0, GX, ER: Unknown, PR: Unknown, HER2: Not Assessed) - Signed by Truitt Merle, MD on 12/28/2017  HISTORY OF PRESENTING ILLNESS:  Lindsay [name] 72 y.o. female is here because of left breast cancer.   Initially, the patient palpated a lump within the retroareolar region of the left breast at the end of July. She also had noticed some left nipple inversion, soreness, and hardness around the nipple for several months prior. In the past three months she reports that her soreness had improved originally, but since then she has not noticed any changes or worsening. She denies any abdominal pain, back  pain, cough, chest pain, loss of appetite, weight loss, or any other associated symptoms in this time period.   She subsequently underwent mammogram and US of the left breast including the axilla which was notable for a suspicious mass within the 11 o'clock region of the left breast measuring at 2.8cm in the greatest dimension. Following this, she underwent stereotactic needle-core biopsy showing her to have invasive ductal carcinoma, histologic analysis demonstrating triple negative breast cancer, Ki67 20%. Additionally, an enlarged lymph node was surveyed within the left axilla which was positive for metastatic carcinoma.   On 06/17/17, she met with Dr [name] of [facility]. Given her triple negative cancer and the invasive nature of her disease, he recommended mastectomy with SLN dissection with port placement. She was subsequently referred into medical oncology for ongoing management of her disease to consider beginning surgery vs chemotherapy as the first round of treatment.   She reports today with her husband and sister. Overall she has been doing well and without notable acute complaints. Her breast soreness has resolved since initial onset. She has no other symptoms of concern to her. Of note, both of her sisters have also been diagnosed with breast cancer and she has an extensive family history of other cancers, including her mother who had pancreatic cancer.   GYN HISTORY  Menarchal: 72 years of age LMP: 21 years ago Contraceptive: Yes in her youth HRT: none GP: G2P2, she was 72yo at the age of first delivery. She did not breast feed.   She did previously smoke for approximately 50 years. She quit three years ago on August 4th, 2015. On average, she smoked 2ppd. She does not drink any alcoholic beverages and does not partake in illicit drugs.    CURRENT THERAPY:  Neoadjuvant Adriamycin and Cytoxan (AC) every 2 weeks for 4 cycles starting 07/01/17, reduced 10% due to poor  toleration starting with cycle 2. Completed on 08/17/17. Start Carbo and taxol weekly for 12 cycles 08/31/17. Taxol changed to abraxane on 1/23 due to suspected taxol drug rash, carboplatin decreased with cycle 4 for mild thrombocytopenia. Due to cytopenia, reduced Carbo to 1.0 AUC starting with cycle 10    INTERVAL HISTORY:  Lindsay [name] is here for a follow up after her mastectomy on 12/06/17. She presents to the clinic today accompanied by her family member. She notes she did well with surgery and she is continuing to recover. She is very happy about her surgical pathology results. She states it is a little painful in the incision near her left axilla.   On review of systems, pt denies any other complaints at this time. Pertinent positives are listed and detailed within the above HPI.   MEDICAL HISTORY:  Past Medical History:  Diagnosis Date  . Breast cancer ([place])   . Cancer [facility])    left breast cancer  . Dyspnea   . Dysrhythmia    PACs and nonsustained runs of atrial tach by Holter 10/13/16  .  Family history of breast cancer   . Family history of colon cancer   . Family history of melanoma   . Family history of pancreatic cancer   . History of kidney stones   . Hypertension   . Personal history of colonic polyps   . Personal history of skin cancer     SURGICAL HISTORY: Past Surgical History:  Procedure Laterality Date  . COLONOSCOPY    . FOOT SURGERY Right   . IR CV LINE INJECTION  08/02/2017  . left breast lumpectomy  2000  . MASTECTOMY WITH AXILLARY LYMPH NODE DISSECTION Left 12/06/2017   Procedure: LEFT MASTECTOMY WITH TARGETED LYMPH NODE DISSECTION;  Surgeon: Coralie Keens, MD;  Location: [facility];  Service: General;  Laterality: Left;  . PORTACATH PLACEMENT Right 06/30/2017   Procedure: [place];  Surgeon: Coralie Keens, MD;  Location: [facility];  Service: General;  Laterality: Right;    SOCIAL HISTORY: Social History   Socioeconomic History  .  Marital status: Married    Spouse name: Not on file  . Number of children: Not on file  . Years of education: Not on file  . Highest education level: Not on file  Occupational History  . Not on file  Social Needs  . Financial resource strain: Not on file  . Food insecurity:    Worry: Not on file    Inability: Not on file  . Transportation needs:    Medical: Not on file    Non-medical: Not on file  Tobacco Use  . Smoking status: Former Smoker    Packs/day: 2.00    Years: 50.00    Pack years: 100.00    Last attempt to quit: 06/02/2014    Years since quitting: 3.5  . Smokeless tobacco: Never Used  Substance and Sexual Activity  . Alcohol use: No  . Drug use: No  . Sexual activity: Yes  Lifestyle  . Physical activity:    Days per week: Not on file    Minutes per session: Not on file  . Stress: Not on file  Relationships  . Social connections:    Talks on phone: Not on file    Gets together: Not on file    Attends religious service: Not on file    Active member of club or organization: Not on file    Attends meetings of clubs or organizations: Not on file    Relationship status: Not on file  . Intimate partner violence:    Fear of current or ex partner: Not on file    Emotionally abused: Not on file    Physically abused: Not on file    Forced sexual activity: Not on file  Other Topics Concern  . Not on file  Social History Narrative  . Not on file    FAMILY HISTORY: Family History  Problem Relation Age of Onset  . Pancreatic cancer Mother 76  . Breast cancer Sister 76       bilateral mastectomy  . Basal cell carcinoma Sister        'many'  . Breast cancer Paternal Aunt   . Breast cancer Sister 36  . Melanoma Sister   . Colon cancer Sister 32    ALLERGIES:  has No Known Allergies.  MEDICATIONS:  Current Outpatient Medications  Medication Sig Dispense Refill  . acebutolol (SECTRAL) 200 MG capsule Take 200 mg by mouth 2 (two) times daily.    Marland Kitchen  atorvastatin (LIPITOR) 10 MG tablet Take 10  mg by mouth at bedtime.    . calcium carbonate (OS-CAL) 600 MG TABS tablet Take 1,200 mg by mouth daily.    . calcium carbonate (TUMS - DOSED IN MG ELEMENTAL CALCIUM) 500 MG chewable tablet Chew 1 tablet by mouth daily as needed for indigestion or heartburn.    . Cholecalciferol (VITAMIN D3) 2000 units TABS Take 2,000 Units by mouth daily.     . cloNIDine (CATAPRES) 0.1 MG tablet Take 0.1 mg by mouth 2 (two) times daily.    Marland Kitchen escitalopram (LEXAPRO) 5 MG tablet Take 5 mg by mouth daily.    . hydrALAZINE (APRESOLINE) 25 MG tablet Take 12.5 mg by mouth 3 (three) times daily.    Marland Kitchen Ketotifen Fumarate (ALLERGY EYE DROPS OP) Place 1 drop into both eyes daily as needed (allergies).    Marland Kitchen lidocaine-prilocaine (EMLA) cream Apply 1 application topically as needed. (Patient taking differently: Apply 1 application topically as needed (port access). ) 30 g 2  . loratadine (CLARITIN) 10 MG tablet Take 10 mg by mouth daily as needed for allergies.     Marland Kitchen LORazepam (ATIVAN) 1 MG tablet Take 1 tablet (1 mg total) by mouth once as needed for anxiety. Take 1 tab 1 hour before MRI scan (Patient not taking: Reported on 11/24/2017) 2 tablet 0  . magnesium oxide (MAG-OX) 400 MG tablet Take 400 mg by mouth daily.    . Multiple Vitamins-Minerals (PRESERVISION AREDS 2) CAPS Take 1 capsule by mouth 2 (two) times daily.    . naproxen sodium (ALEVE) 220 MG tablet Take 220 mg by mouth daily as needed (pain).    Marland Kitchen olmesartan-hydrochlorothiazide (BENICAR HCT) 40-25 MG tablet Take 1 tablet by mouth daily.  1  . Omega-3 Fatty Acids (FISH OIL) 1200 MG CAPS Take 1,200 mg by mouth 2 (two) times daily.    . ondansetron (ZOFRAN) 8 MG tablet Take 1 tablet (8 mg total) by mouth every 8 (eight) hours as needed for nausea or vomiting. (Patient not taking: Reported on 11/24/2017) 20 tablet 2  . oxyCODONE (OXY IR/ROXICODONE) 5 MG immediate release tablet Take 1-2 tablets (5-10 mg total) by mouth every 6  (six) hours as needed for moderate pain or severe pain. 20 tablet 0  . prochlorperazine (COMPAZINE) 10 MG tablet Take 1 tablet (10 mg total) by mouth every 6 (six) hours as needed for nausea or vomiting. (Patient not taking: Reported on 11/24/2017) 30 tablet 2  . vitamin B-12 (CYANOCOBALAMIN) 1000 MCG tablet Take 1 tablet by mouth daily.     No current facility-administered medications for this visit.    Facility-Administered Medications Ordered in Other Visits  Medication Dose Route Frequency Provider Last Rate Last Dose  . sodium chloride flush (NS) 0.9 % injection 10 mL  10 mL Intravenous PRN Truitt Merle, MD   10 mL at 07/29/17 1310    REVIEW OF SYSTEMS:   Constitutional: Denies fevers,  abnormal night sweats Eyes: Denies blurriness of vision, double vision or watery eyes Ears, nose, mouth, throat, and face: Denies mucositis or sore throat   Respiratory: Denies cough, wheezes   Cardiovascular: Denies palpitation, chest discomfort or lower extremity swelling Gastrointestinal:  Denies nausea, heartburn or change in bowel habits Skin: Denies abnormal skin rashes Lymphatics: Denies new lymphadenopathy or easy bruising Neurological:Denies numbness, new weaknesses  Behavioral/Psych: Mood is stable, no new changes  All other systems were reviewed with the patient and are negative.  PHYSICAL EXAMINATION: ECOG PERFORMANCE STATUS: 1-2 Vitals:   12/28/17 1450  BP: Marland Kitchen)  191/63  Pulse: 77  Resp: 18  Temp: 98.1 F (36.7 C)  TempSrc: Oral  SpO2: 95%  Weight: 247 lb 9.6 oz (112.3 kg)  Height: '5\' 7"'  (1.702 m)    GENERAL:alert, no distress and comfortable SKIN: skin color, texture, turgor are normal, no rashes or significant lesions EYES: normal, conjunctiva are pink and non-injected, sclera clear OROPHARYNX:no exudate, no erythema and lips, buccal mucosa, and tongue normal  NECK: supple, thyroid normal size, non-tender, without nodularity LYMPH:  no palpable lymphadenopathy in the cervical,  axillary or inguinal LUNGS: clear to auscultation and percussion with normal breathing effort HEART: regular rate & rhythm and no murmurs and no lower extremity edema ABDOMEN:abdomen soft, non-tender and normal bowel sounds Musculoskeletal:no cyanosis of digits and no clubbing  PSYCH: alert & oriented x 3 with fluent speech NEURO: no focal motor/sensory deficits BREAST: s/p left breast mastectomy. Her surgical incisions are healing well. No discharge or erythema. No palpable mass or adenopathy.    LABORATORY DATA:  I have reviewed the data as listed CBC Latest Ref Rng & Units 12/28/2017 12/07/2017 11/29/2017  WBC 3.9 - 10.3 K/uL 6.6 10.1 6.2  Hemoglobin 11.6 - 15.9 g/dL 10.8(L) 9.6(L) 9.2(L)  Hematocrit 34.8 - 46.6 % 32.9(L) 30.0(L) 28.1(L)  Platelets 145 - 400 K/uL 199 196 144(L)   CMP Latest Ref Rng & Units 12/28/2017 12/07/2017 11/29/2017  Glucose 70 - 140 mg/dL 94 109(H) 110  BUN 7 - 26 mg/dL '20 15 17  ' Creatinine 0.60 - 1.10 mg/dL 1.00 1.03(H) 0.99  Sodium 136 - 145 mmol/L 140 141 139  Potassium 3.5 - 5.1 mmol/L 3.9 4.2 4.0  Chloride 98 - 109 mmol/L 105 104 103  CO2 22 - 29 mmol/L '26 25 28  ' Calcium 8.4 - 10.4 mg/dL 10.1 8.9 9.5  Total Protein 6.4 - 8.3 g/dL 6.8 - 6.1(L)  Total Bilirubin 0.2 - 1.2 mg/dL 0.5 - 0.5  Alkaline Phos 40 - 150 U/L 59 - 56  AST 5 - 34 U/L 25 - 22  ALT 0 - 55 U/L 17 - 19   PATHOLOGY:   Diagnosis 12/06/17 1. Lymph node, sentinel, biopsy, Left Axillary w/ seed - NO CARCINOMA IDENTIFIED IN ONE LYMPH NODE (0/1) - PREVIOUS BIOPSY SITE CHANGES PRESENT 2. Lymph node, sentinel, biopsy, Left Axillary #2 - FIBROVASCULAR AND ADIPOSE TISSUE - NO LYMPHOID TISSUE IDENTIFIED 3. Breast, simple mastectomy, Left - NO RESIDUAL CARCINOMA IDENTIFIED (STATUS POST NEOADJUVANT THERAPY) - ATYPICAL DUCTAL HYPERPLASIA AND FLAT EPITHELIAL ATYPIA WITH CALCIFICATIONS - FOCAL USUAL DUCTAL HYPERPLASIA, FIBROCYSTIC CHANGES AND DUCT ECTASIA - BENIGN INTRADUCTAL PAPILLOMA - SEBORRHEIC  KERATOSIS - PREVIOUS BIOPSY SITE CHANGES PRESENT - SEE COMMENT 4. Lymph node, sentinel, biopsy, Left Axillary #3 - NO CARCINOMA IDENTIFIED IN ONE LYMPH NODE (0/1) 5. Lymph node, sentinel, biopsy, Left - NO CARCINOMA IDENTIFIED IN ONE LYMPH NODE (0/1) 6. Lymph node, sentinel, biopsy, Left - NO CARCINOMA IDENTIFIED IN ONE LYMPH NODE (0/1) Microscopic Comment 3. An oncology table was not completed on this case because there is no residual carcinoma status post neoadjuvant therapy (ypT0, ypN0).  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 Wall thickness was   increased in a pattern of moderate LVH. Systolic function was   normal. The estimated ejection fraction was in the range of 60%   to 65%. Wall motion was normal; there were no regional wall   motion abnormalities. Left ventricular diastolic function   parameters were normal. - Mitral valve: Calcified annulus. Mildly thickened leaflets . - Left atrium: The atrium was mildly dilated. - Atrial septum: There was increased thickness of the septum,   consistent with lipomatous hypertrophy. No defect or patent   foramen ovale was identified. - Impressions: Normal GLS - 18.6 Impressions: - Normal GLS - 18.6  RADIOGRAPHIC STUDIES: I have personally reviewed the radiological images as listed and agreed with the findings in the report.  Diagnostic Mammogram and [place] 08/29/17  IMPRESSION:  Interval decrease in the size of known left 11 o'clock malignancy, with greatest dimension  of 1.3cm.  Interval decrease in size of known left malignant axillary lymph node, with cortical thickness of 3 mm.   Nm Sentinel Node Inj-no Rpt (breast)  Result Date: 12/06/2017 Sulfur colloid was injected by the nuclear medicine technologist for melanoma sentinel node.   Mm Breast Surgical Specimen  Result Date: 12/06/2017 CLINICAL DATA:  Evaluate surgical specimen for a second radioactive seed placed within the LEFT axilla, that did not correspond to the biopsy-proven metastatic LEFT axillary lymph node. EXAM: SPECIMEN RADIOGRAPH OF THE LEFT AXILLA COMPARISON:  Previous exam(s). FINDINGS: Status post excision of the LEFT axilla. The radioactive seed is present, completely intact, and were marked for pathology. IMPRESSION: Specimen radiograph of the LEFT axilla Electronically Signed   By: Margarette Canada M.D.   On: 12/06/2017 09:38   Mm Breast Surgical Specimen  Result Date: 12/06/2017 CLINICAL DATA:  Evaluate surgical specimen following excision of metastatic LEFT axillary lymph node. EXAM: SPECIMEN RADIOGRAPH OF THE LEFT AXILLA COMPARISON:  Previous exam(s). FINDINGS: Status post excision of the LEFT axilla the radioactive seed and spiral biopsy marker clip are present, completely intact, and were marked for pathology. IMPRESSION: Specimen radiograph of the LEFT axilla Electronically Signed   By: Margarette Canada M.D.   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 Patient is to have a LEFT mastectomy and targeted axillary node dissection on 12/06/2017. She has undergone neoadjuvant chemotherapy with excellent response as the previously biopsied lymph node has returned to a normal appearance. Preoperative radioactive seed localization of the LEFT axillary lymph node is performed.  EXAM: ULTRASOUND GUIDED RADIOACTIVE SEED LOCALIZATION OF THE LEFT AXILLA COMPARISON:  Previous exam(s). FINDINGS: Patient presents for radioactive seed localization prior to targeted LEFT axillary node dissection. I met with the patient and we discussed the procedure of seed localization including benefits and alternatives. We discussed the high likelihood of a successful procedure. We discussed the risks of the procedure including infection, bleeding, tissue injury and further surgery. We discussed the low dose of radioactivity involved in the procedure. Informed, written consent was given. The usual time-out protocol was performed immediately prior to the procedure. Initially, sonographic evaluation of the axilla was performed. What I thought was the biopsied lymph node containing the tissue marker clip was identified. Using ultrasound guidance, sterile technique with chlorhexidine as skin antisepsis, 1% lidocaine as local anesthetic, an I-125 radioactive seed was used  to localize the lymph node using a lateral approach. The follow-up mammogram images demonstrate the seed is in an adjacent lymph node, approximately 4.5 cm deep to the previously biopsied lymph node. Therefore, a second seed was placed using the same technique after identifying the presumptive previously biopsied lymph node. The follow-up mammogram images confirm that the second seed is appropriately positioned immediately adjacent to the previously biopsied lymph node and the image was marked for Dr. [name]. Follow-up survey of the patient confirms presence of the radioactive seed. Order number of I-125 seeds: [id number] (for both) Total Activity: 0.248 mCi (for both) Reference Date: 11/24/2017 (for both) The patient tolerated the procedure well and was released from the [facility]. She was given instructions regarding seed removal. IMPRESSION: 1. Radioactive seed localization of the LEFT axilla. No apparent complications. 2. Two seeds were  utilized.  Please see above. Electronically Signed   By: Evangeline Dakin M.D.   On: 12/02/2017 15:25   Mm Clip Placement Left  Result Date: 12/02/2017 CLINICAL DATA:  Confirmation of radioactive seed placement into a metastatic LEFT axillary lymph node. The patient has undergone neoadjuvant chemotherapy and the lymph node is now normal in size. The patient is scheduled for LEFT mastectomy and targeted and axillary node dissection on 12/06/2017. EXAM: DIAGNOSTIC LEFT MAMMOGRAM POST ULTRASOUND RADIOACTIVE SEED PLACEMENT COMPARISON:  Previous exam(s). FINDINGS: Mammographic images were obtained following ultrasound guided radioactive seed placement into a metastatic LEFT axillary node prior to targeted node dissection. After initial radioactive seed placement, the spot compression MLO view of the LEFT axilla shows that the seed was placed into a normal-appearing lymph node adjacent to the now normal appearing metastatic lymph node, designated by the spiral shaped tissue marker clip. The radioactive seed is approximately 4.5 cm POSTERIOR and MEDIAL to the metastatic node. Because of the distance of greater than 2 cm, a second radioactive seed was placed under ultrasound guidance. The second seed is appropriately positioned adjacent to the previously biopsied node. The seeds are approximately 4 cm apart on the completion mammogram image. Expected post procedural changes are present and there is no evidence of a hematoma. IMPRESSION: 1. The initial radioactive seed was placed into a normal-appearing LEFT axillary lymph node POSTERIOR and MEDIAL to the treated metastatic lymph node. The distance between the radioactive seed and the treated lymph node measured 4.5 cm, necessitating a second seed placement. 2. The second radioactive seed is appropriately positioned adjacent to the previously biopsied metastatic node which is designated by the spiral shaped tissue marker clip. 3. The seeds are approximately 4 cm apart on  the completion image. The results were discussed directly by telephone with Dr. [name] on 12/02/2017 at 3:35 p.m. Final Assessment: Post Procedure Mammograms for Marker Placement Electronically Signed   By: Evangeline Dakin M.D.   On: 12/02/2017 15:36     [place] Left Breast 06/08/17 IMPRESSION:  Highly suspicious palpable left breast 11 o'clock mass which measures 2.8 cm in greatest dimension. Ductal extension anteriorly to the level of the nipple, and posteriorly for additional 1.8cm, is suspicious for tumor extension.   ASSESSMENT: Lindsay [name] is a wonderful 72 y.o. female who presents today to discuss the ongoing management of her left breast cancer.   1. 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 ypT0N0 -We previously discussed her mammogram, ultrasound findings, and initial biopsy results with patient and her family members in great detail.  -  She previously presented with a palpable left breast mass, measures 2.8 cm on ultrasound, with left axillary lymph node involvement (not palpable on exam). Breast tumor and node biopsy showed triple negative breast ductal carcinoma. -She was seen by surgeon Dr. [name] who initially recommended left mastectomy with ALND and port placement.  -Her initial staging CT and bone scan were negative for metastasis. -She completed her neoadjuvant chemotherapy with dose dense ACX4, followed by weekly carboplatin and Taxol for 12 weeks.  She tolerated moderately well. -She underwent lest breast mastectomy with targeted lymph node dissection with Dr. [name] on 12/06/17. Her surgical pathology results revealed no residual disease and 5 negative lymph nodes.  -Given her excellent response to neoadjuvant chemotherapy, she would not need additional adjuvant chemo, she is not eligible for the adjuvant Keytruda clinical trial. -Today she presents to the  clinic, healing well from mastectomy. Her only residual sie effect from chemo is still some grade 1 peripheral neuropathy in her fingers and toes. She will f/u with Dr. [name] on 01/09/18 and make an appointment for port removal.  -I discussed that the next step for her standard treatment would be RT. She is hesitant to get adjuvant radiation therapy due to the cost and the time. I discussed that she can get RT at [facility], closer to her house. I discussed the benefit of RT to lower the risk of local recurrence. I encouraged her to at least do a consultation with Rad Onc.  After lengthy discussion, she declined. She knows to call if she changes her mind.  -We discussed the surveillance plan, she will f/u with me for lab and physical exam every 3-4 months for the first 2 years and then every 6 months for a total of 5 years. She will continue with self exam, mammogram for the right breast and [place] as needed.  -Survivorship clinic in 3 months -Mammogram in April 2020 -F/u in 6 months    2. AFIB and HTN -She is currently followed by a cardiologist for ongoing management of this. Not currently on formal anticoagulation therapy.  -She was on 24m ASA daily, but she reports that she stopped this in anticipation for her surgery. I encouraged her to start taking this again until her surgeon tell her to stop taking this.  -Baseline Echo adequate to start chemo -She is on clonidine, hydralazine, and olmesartan-HCTZ. She has had dizziness with hypotension in the past, she occasionally holds BP meds if she feels dizzy. Will monitor frequently today during chemo and blood products.    3. Genetics -I previously encouraged her to have genetic testing due to her strong family history of cancer. Pt was seen by a genetic counselor on 08/31/17 and submitted for testing.  -The Common Hereditary Cancer Panel + Melanoma Panel + Basal Cell Nevus Syndrome Panel was Negative, no pathogenic variants identified. The date  of this test report is 09/09/2017.    4. Diverticulitis requiring hospitalization and IV antibiotics  -Hospitalized 11/24-11/27 and treated with antibiotics Levaquin and or Flagyl -She acquired rash from her antibiotics and stopped the medication.  -Resolved now  5. Decreased appetite and weight loss  -Pt loss 20 lbs over a span of 6 weeks. She attributes this to her poor appetite.  -I encouraged her to eat as much as she can tolerate and to additionally begin eating a more high protein diet so that she can keep up her caloric intake.  -I also encouraged her to drink Boost supplements as she is  able.  -Resolved; weight is stable  6. Skin rash - face, chest, arms, back, likely related to Taxol  -Suggested she apply topical hydrocortisone to affected areas and take po benadryl at night for itching as needed. -Taxol was switched to abraxane starting with cycle 4 -With no improvement on hydrocortisone, she was previously prescribed medol dose pak. She is to continue benadryl for itching PRN -She completed medrol dose pack on 2/6 -resolved now   7. Chemotherapy induced anemia, thrombocytopenia  -reduced [place] with cycle 4 -She has had symptomatic anemia requiring blood transfusion on chemotherapy -Anemia is stable Hgb decreased to 8.7 today; she does not require transfusion at this time -Cycle 5 was omitted for platelet count 60K, carboplatin had been previously dose reduced; plt recovered to 109K, no active bleeding. Will proceed with further dose reduction and monitor closely. -Improving, she still has mild residual anemia, rest of CBC are normal.  8.  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 -Reduce chemo if needed  PLAN:  -She declined adjuvant RT -Port removal by Dr. [name] -Lab and port flush today -Survivorship clinic in 3 months  -Mammogram in April 2020 -F/u in 6 months    I spent a total of 30 minutes for her  visit today, more than 50% of face-to-face counseling.  No orders of the defined types were placed in this encounter.  This document serves as a record of services personally performed by Truitt Merle, MD. It was created on her behalf by Theresia Bough, a trained medical scribe. The creation of this record is based on the scribe's personal observations and the provider's statements to them.   I have reviewed the above documentation for accuracy and completeness, and I agree with the above.    Truitt Merle, MD 12/28/2017

## 2017-12-28 ENCOUNTER — Inpatient Hospital Stay: Payer: Medicare Other

## 2017-12-28 ENCOUNTER — Encounter: Payer: Self-pay | Admitting: Hematology

## 2017-12-28 ENCOUNTER — Inpatient Hospital Stay: Payer: Medicare Other | Attending: Hematology | Admitting: Hematology

## 2017-12-28 VITALS — BP 191/63 | HR 77 | Temp 98.1°F | Resp 18 | Ht 67.0 in | Wt 247.6 lb

## 2017-12-28 DIAGNOSIS — Z87891 Personal history of nicotine dependence: Secondary | ICD-10-CM

## 2017-12-28 DIAGNOSIS — Z171 Estrogen receptor negative status [ER-]: Secondary | ICD-10-CM | POA: Insufficient documentation

## 2017-12-28 DIAGNOSIS — C50212 Malignant neoplasm of upper-inner quadrant of left female breast: Secondary | ICD-10-CM

## 2017-12-28 DIAGNOSIS — R630 Anorexia: Secondary | ICD-10-CM | POA: Insufficient documentation

## 2017-12-28 DIAGNOSIS — I4891 Unspecified atrial fibrillation: Secondary | ICD-10-CM | POA: Diagnosis not present

## 2017-12-28 DIAGNOSIS — G620 Drug-induced polyneuropathy: Secondary | ICD-10-CM | POA: Diagnosis not present

## 2017-12-28 DIAGNOSIS — R634 Abnormal weight loss: Secondary | ICD-10-CM | POA: Insufficient documentation

## 2017-12-28 DIAGNOSIS — Z9221 Personal history of antineoplastic chemotherapy: Secondary | ICD-10-CM | POA: Insufficient documentation

## 2017-12-28 DIAGNOSIS — I10 Essential (primary) hypertension: Secondary | ICD-10-CM | POA: Insufficient documentation

## 2017-12-28 DIAGNOSIS — Z9012 Acquired absence of left breast and nipple: Secondary | ICD-10-CM | POA: Diagnosis not present

## 2017-12-28 DIAGNOSIS — Z853 Personal history of malignant neoplasm of breast: Secondary | ICD-10-CM | POA: Diagnosis not present

## 2017-12-28 DIAGNOSIS — D6481 Anemia due to antineoplastic chemotherapy: Secondary | ICD-10-CM | POA: Diagnosis not present

## 2017-12-28 LAB — COMPREHENSIVE METABOLIC PANEL
ALT: 17 U/L (ref 0–55)
AST: 25 U/L (ref 5–34)
Albumin: 4 g/dL (ref 3.5–5.0)
Alkaline Phosphatase: 59 U/L (ref 40–150)
Anion gap: 9 (ref 3–11)
BUN: 20 mg/dL (ref 7–26)
CO2: 26 mmol/L (ref 22–29)
Calcium: 10.1 mg/dL (ref 8.4–10.4)
Chloride: 105 mmol/L (ref 98–109)
Creatinine, Ser: 1 mg/dL (ref 0.60–1.10)
GFR calc Af Amer: >60 mL/min (ref >60)
GFR calc non Af Amer: 54 mL/min — ABNORMAL LOW (ref >60)
Glucose, Bld: 94 mg/dL (ref 70–140)
Potassium: 3.9 mmol/L (ref 3.5–5.1)
Sodium: 140 mmol/L (ref 136–145)
Total Bilirubin: 0.5 mg/dL (ref 0.2–1.2)
Total Protein: 6.8 g/dL (ref 6.4–8.3)

## 2017-12-28 LAB — CBC WITH DIFFERENTIAL/PLATELET
Basophils Absolute: 0 10*3/uL (ref 0.0–0.1)
Basophils Relative: 1 %
Eosinophils Absolute: 0.3 10*3/uL (ref 0.0–0.5)
Eosinophils Relative: 4 %
HCT: 32.9 % — ABNORMAL LOW (ref 34.8–46.6)
Hemoglobin: 10.8 g/dL — ABNORMAL LOW (ref 11.6–15.9)
Lymphocytes Relative: 36 %
Lymphs Abs: 2.4 10*3/uL (ref 0.9–3.3)
MCH: 32.8 pg (ref 25.1–34.0)
MCHC: 32.9 g/dL (ref 31.5–36.0)
MCV: 99.7 fL (ref 79.5–101.0)
Monocytes Absolute: 1 10*3/uL — ABNORMAL HIGH (ref 0.1–0.9)
Monocytes Relative: 15 %
Neutro Abs: 2.9 10*3/uL (ref 1.5–6.5)
Neutrophils Relative %: 44 %
Platelets: 199 10*3/uL (ref 145–400)
RBC: 3.3 MIL/uL — ABNORMAL LOW (ref 3.70–5.45)
RDW: 17.4 % — ABNORMAL HIGH (ref 11.2–14.5)
WBC: 6.6 10*3/uL (ref 3.9–10.3)

## 2017-12-29 LAB — CANCER ANTIGEN 27.29: CA 27.29: 34.7 U/mL (ref 0.0–38.6)

## 2018-01-09 ENCOUNTER — Other Ambulatory Visit: Payer: Self-pay | Admitting: Surgery

## 2018-01-10 ENCOUNTER — Other Ambulatory Visit: Payer: Self-pay

## 2018-01-10 ENCOUNTER — Encounter (HOSPITAL_BASED_OUTPATIENT_CLINIC_OR_DEPARTMENT_OTHER): Payer: Self-pay | Admitting: *Deleted

## 2018-01-12 NOTE — H&P (Signed)
  Lindsay [name] Documented: 01/09/2018 3:52 PM Location: [facility] Patient #: [id number] DOB: 1945/10/14 Married / Language: English / Race: White Female   History of Present Illness (Lindsay [name] A. [name] MD; 01/09/2018 3:59 PM) The patient is a 72 year old female presenting for a post-operative visit. She is here for another postoperative visit. She has again seen oncology and has decided not to undergo radiation therapy. She is now rated her Port-A-Cath removed. She is feeling much better and is already been working in.  MEDICAL HISTORY:      Past Medical History:  Diagnosis Date  . Breast cancer ([place])   . Cancer [facility])    left breast cancer  . Dyspnea   . Dysrhythmia    PACs and nonsustained runs of atrial tach by Holter 10/13/16  . Family history of breast cancer   . Family history of colon cancer   . Family history of melanoma   . Family history of pancreatic cancer   . History of kidney stones   . Hypertension   . Personal history of colonic polyps   . Personal history of skin cancer     SURGICAL HISTORY:      Past Surgical History:  Procedure Laterality Date  . COLONOSCOPY    . FOOT SURGERY Right   . IR CV LINE INJECTION  08/02/2017  . left breast lumpectomy  2000  . MASTECTOMY WITH AXILLARY LYMPH NODE DISSECTION Left 12/06/2017   Procedure: LEFT MASTECTOMY WITH TARGETED LYMPH NODE DISSECTION;  Surgeon: Coralie Keens, MD;  Location: [facility];  Service: General;  Laterality: Left;  . PORTACATH PLACEMENT Right 06/30/2017   Procedure: Marie;  Surgeon: Coralie Keens, MD;  Location: [facility];  Service: General;  Laterality: Right;      Allergies Malachi Bonds, CMA; 01/09/2018 3:52 PM) No Known Drug Allergies [06/17/2017]:  Medication History Malachi Bonds, CMA; 01/09/2018 3:52 PM) Acebutolol HCl (200MG  Capsule, Oral) Active. Atorvastatin Calcium (10MG  Tablet, Oral) Active. CloNIDine HCl (0.1MG  Tablet,  Oral) Active. Escitalopram Oxalate (5MG  Tablet, Oral) Active. HydrALAZINE HCl (25MG  Tablet, Oral) Active. Olmesartan Medoxomil-HCTZ (40-25MG  Tablet, Oral) Active. Valsartan (320MG  Tablet, Oral) Active. Lidocaine-Prilocaine (2.5-2.5% Cream, External) Active. Medications Reconciled  Vitals (Chemira Jones CMA; 01/09/2018 3:52 PM) 01/09/2018 3:52 PM Weight: 247.6 lb Height: 67in Body Surface Area: 2.21 m Body Mass Index: 38.78 kg/m  Temp.: 98.48F(Oral)  Pulse: 66 (Regular)        Physical Exam (Keland Peyton A. [name] MD; 01/09/2018 3:59 PM) The physical exam findings are as follows: Note:Today, she looks great. The mastectomy site is well healed. The port site is stable Lungs clear CV RRR Abdomen soft, NT/ND Skin without rash   Assessment & Plan (Ashden Sonnenberg A. [name] MD; 01/09/2018 3:59 PM) POSTOP CHECK (J18) Impression: She is doing well. Again, she does not want to do radiation therapy. I will now schedule her for Port-A-Cath removal. I discussed the risks of this with her in detail. Surgery will be scheduled Current Plan

## 2018-01-13 ENCOUNTER — Encounter (HOSPITAL_BASED_OUTPATIENT_CLINIC_OR_DEPARTMENT_OTHER)
Admission: RE | Disposition: A | Discharge status: Home / Self Care | Payer: Self-pay | Source: Ambulatory Visit | Attending: Surgery

## 2018-01-13 ENCOUNTER — Encounter (HOSPITAL_BASED_OUTPATIENT_CLINIC_OR_DEPARTMENT_OTHER): Payer: Self-pay | Admitting: *Deleted

## 2018-01-13 ENCOUNTER — Ambulatory Visit (HOSPITAL_BASED_OUTPATIENT_CLINIC_OR_DEPARTMENT_OTHER): Payer: Medicare Other | Admitting: Certified Registered"

## 2018-01-13 ENCOUNTER — Other Ambulatory Visit: Payer: Self-pay

## 2018-01-13 ENCOUNTER — Ambulatory Visit (HOSPITAL_BASED_OUTPATIENT_CLINIC_OR_DEPARTMENT_OTHER)
Admission: RE | Admit: 2018-01-13 | Discharge: 2018-01-13 | Disposition: A | Discharge status: Home / Self Care | Payer: Medicare Other | Source: Ambulatory Visit | Attending: Surgery | Admitting: Surgery

## 2018-01-13 DIAGNOSIS — D649 Anemia, unspecified: Secondary | ICD-10-CM | POA: Diagnosis not present

## 2018-01-13 DIAGNOSIS — Z853 Personal history of malignant neoplasm of breast: Secondary | ICD-10-CM | POA: Insufficient documentation

## 2018-01-13 DIAGNOSIS — Z85828 Personal history of other malignant neoplasm of skin: Secondary | ICD-10-CM | POA: Insufficient documentation

## 2018-01-13 DIAGNOSIS — I10 Essential (primary) hypertension: Secondary | ICD-10-CM | POA: Diagnosis not present

## 2018-01-13 DIAGNOSIS — Z452 Encounter for adjustment and management of vascular access device: Secondary | ICD-10-CM | POA: Insufficient documentation

## 2018-01-13 DIAGNOSIS — Z79899 Other long term (current) drug therapy: Secondary | ICD-10-CM | POA: Diagnosis not present

## 2018-01-13 HISTORY — PX: PORT-A-CATH REMOVAL: SHX5289

## 2018-01-13 SURGERY — REMOVAL PORT-A-CATH
Anesthesia: Monitor Anesthesia Care | Site: Chest | Laterality: Right

## 2018-01-13 MED ORDER — ONDANSETRON HCL 4 MG/2ML IJ SOLN
INTRAMUSCULAR | Status: AC
  Filled 2018-01-13: qty 2

## 2018-01-13 MED ORDER — DEXAMETHASONE SODIUM PHOSPHATE 10 MG/ML IJ SOLN
INTRAMUSCULAR | Status: AC
  Filled 2018-01-13: qty 1

## 2018-01-13 MED ORDER — ACETAMINOPHEN 500 MG PO TABS
ORAL_TABLET | ORAL | Status: AC
  Filled 2018-01-13: qty 1

## 2018-01-13 MED ORDER — LIDOCAINE HCL (PF) 1 % IJ SOLN
INTRAMUSCULAR | Status: DC | PRN
  Administered 2018-01-13: 5 mL

## 2018-01-13 MED ORDER — ACETAMINOPHEN 500 MG PO TABS
1000.0000 mg | ORAL_TABLET | ORAL | Status: AC
  Administered 2018-01-13: 1000 mg via ORAL

## 2018-01-13 MED ORDER — CEFAZOLIN SODIUM-DEXTROSE 2-4 GM/100ML-% IV SOLN
2.0000 g | INTRAVENOUS | Status: AC
  Administered 2018-01-13: 2 g via INTRAVENOUS

## 2018-01-13 MED ORDER — LIDOCAINE 2% (20 MG/ML) 5 ML SYRINGE
INTRAMUSCULAR | Status: DC | PRN
  Administered 2018-01-13: 50 mg via INTRAVENOUS

## 2018-01-13 MED ORDER — FENTANYL CITRATE (PF) 100 MCG/2ML IJ SOLN: 25.0000 ug | INTRAMUSCULAR | Status: DC | PRN

## 2018-01-13 MED ORDER — MEPERIDINE HCL 25 MG/ML IJ SOLN: 6.2500 mg | INTRAMUSCULAR | Status: DC | PRN

## 2018-01-13 MED ORDER — CHLORHEXIDINE GLUCONATE CLOTH 2 % EX PADS: 6.0000 | MEDICATED_PAD | Freq: Once | CUTANEOUS | Status: DC

## 2018-01-13 MED ORDER — SCOPOLAMINE 1 MG/3DAYS TD PT72: 1.0000 | MEDICATED_PATCH | Freq: Once | TRANSDERMAL | Status: DC | PRN

## 2018-01-13 MED ORDER — FENTANYL CITRATE (PF) 100 MCG/2ML IJ SOLN
INTRAMUSCULAR | Status: AC
  Filled 2018-01-13: qty 2

## 2018-01-13 MED ORDER — LACTATED RINGERS IV SOLN
INTRAVENOUS | Status: DC
  Administered 2018-01-13: 11:00:00 via INTRAVENOUS

## 2018-01-13 MED ORDER — ACETAMINOPHEN 500 MG PO TABS
ORAL_TABLET | ORAL | Status: AC
  Filled 2018-01-13: qty 2

## 2018-01-13 MED ORDER — CEFAZOLIN SODIUM-DEXTROSE 2-4 GM/100ML-% IV SOLN
INTRAVENOUS | Status: AC
  Filled 2018-01-13: qty 100

## 2018-01-13 MED ORDER — GABAPENTIN 300 MG PO CAPS
300.0000 mg | ORAL_CAPSULE | ORAL | Status: AC
  Administered 2018-01-13: 300 mg via ORAL

## 2018-01-13 MED ORDER — PROPOFOL 500 MG/50ML IV EMUL
INTRAVENOUS | Status: DC | PRN
  Administered 2018-01-13: 50 ug/kg/min via INTRAVENOUS

## 2018-01-13 MED ORDER — DEXAMETHASONE SODIUM PHOSPHATE 10 MG/ML IJ SOLN
INTRAMUSCULAR | Status: DC | PRN
  Administered 2018-01-13: 10 mg via INTRAVENOUS

## 2018-01-13 MED ORDER — LIDOCAINE HCL (CARDIAC) PF 100 MG/5ML IV SOSY
PREFILLED_SYRINGE | INTRAVENOUS | Status: AC
  Filled 2018-01-13: qty 5

## 2018-01-13 MED ORDER — MIDAZOLAM HCL 2 MG/2ML IJ SOLN
INTRAMUSCULAR | Status: AC
  Filled 2018-01-13: qty 2

## 2018-01-13 MED ORDER — GABAPENTIN 300 MG PO CAPS
ORAL_CAPSULE | ORAL | Status: AC
  Filled 2018-01-13: qty 1

## 2018-01-13 MED ORDER — ONDANSETRON HCL 4 MG/2ML IJ SOLN
INTRAMUSCULAR | Status: DC | PRN
Start: 2018-01-13 — End: 2018-01-13
  Administered 2018-01-13: 4 mg via INTRAVENOUS

## 2018-01-13 MED ORDER — FENTANYL CITRATE (PF) 100 MCG/2ML IJ SOLN: 50.0000 ug | INTRAMUSCULAR | Status: DC | PRN

## 2018-01-13 MED ORDER — MIDAZOLAM HCL 5 MG/5ML IJ SOLN
INTRAMUSCULAR | Status: DC | PRN
  Administered 2018-01-13: 2 mg via INTRAVENOUS

## 2018-01-13 MED ORDER — PROMETHAZINE HCL 25 MG/ML IJ SOLN: 6.2500 mg | INTRAMUSCULAR | Status: DC | PRN

## 2018-01-13 MED ORDER — OXYCODONE HCL 5 MG/5ML PO SOLN: 5.0000 mg | Freq: Once | ORAL | Status: DC | PRN

## 2018-01-13 MED ORDER — FENTANYL CITRATE (PF) 100 MCG/2ML IJ SOLN
INTRAMUSCULAR | Status: DC | PRN
  Administered 2018-01-13: 50 ug via INTRAVENOUS

## 2018-01-13 MED ORDER — MIDAZOLAM HCL 2 MG/2ML IJ SOLN: 1.0000 mg | INTRAMUSCULAR | Status: DC | PRN

## 2018-01-13 MED ORDER — OXYCODONE HCL 5 MG PO TABS: 5.0000 mg | ORAL_TABLET | Freq: Once | ORAL | Status: DC | PRN

## 2018-01-13 SURGICAL SUPPLY — 34 items
ADH SKN CLS APL DERMABOND .7 (GAUZE/BANDAGES/DRESSINGS) ×1
BLADE HEX COATED 2.75 (ELECTRODE) ×2 IMPLANT
BLADE SURG 15 STRL LF DISP TIS (BLADE) ×1 IMPLANT
BLADE SURG 15 STRL SS (BLADE) ×2
CHLORAPREP W/TINT 26ML (MISCELLANEOUS) ×2 IMPLANT
COVER BACK TABLE 60X90IN (DRAPES) ×2 IMPLANT
COVER MAYO STAND STRL (DRAPES) ×2 IMPLANT
DECANTER SPIKE VIAL GLASS SM (MISCELLANEOUS) IMPLANT
DERMABOND ADVANCED (GAUZE/BANDAGES/DRESSINGS) ×1
DERMABOND ADVANCED .7 DNX12 (GAUZE/BANDAGES/DRESSINGS) ×1 IMPLANT
DRAPE LAPAROTOMY 100X72 PEDS (DRAPES) ×2 IMPLANT
DRAPE UTILITY XL STRL (DRAPES) ×2 IMPLANT
ELECT REM PT RETURN 9FT ADLT (ELECTROSURGICAL) ×2
ELECTRODE REM PT RTRN 9FT ADLT (ELECTROSURGICAL) ×1 IMPLANT
GLOVE BIO SURGEON STRL SZ 6.5 (GLOVE) ×2 IMPLANT
GLOVE BIOGEL PI IND STRL 7.0 (GLOVE) ×1 IMPLANT
GLOVE BIOGEL PI INDICATOR 7.0 (GLOVE) ×1
GLOVE SURG SIGNA 7.5 PF LTX (GLOVE) ×2 IMPLANT
GOWN STRL REUS W/ TWL LRG LVL3 (GOWN DISPOSABLE) ×1 IMPLANT
GOWN STRL REUS W/ TWL XL LVL3 (GOWN DISPOSABLE) ×1 IMPLANT
GOWN STRL REUS W/TWL LRG LVL3 (GOWN DISPOSABLE) ×2
GOWN STRL REUS W/TWL XL LVL3 (GOWN DISPOSABLE) ×2
NEEDLE HYPO 25X1 1.5 SAFETY (NEEDLE) ×2 IMPLANT
NS IRRIG 1000ML POUR BTL (IV SOLUTION) IMPLANT
PACK BASIN DAY SURGERY FS (CUSTOM PROCEDURE TRAY) ×2 IMPLANT
PENCIL BUTTON HOLSTER BLD 10FT (ELECTRODE) ×2 IMPLANT
SLEEVE SCD COMPRESS KNEE MED (MISCELLANEOUS) IMPLANT
SUT MNCRL AB 4-0 PS2 18 (SUTURE) ×2 IMPLANT
SUT VIC AB 3-0 SH 27 (SUTURE) ×2
SUT VIC AB 3-0 SH 27X BRD (SUTURE) ×1 IMPLANT
SYR BULB 3OZ (MISCELLANEOUS) IMPLANT
SYR CONTROL 10ML LL (SYRINGE) ×2 IMPLANT
TOWEL OR 17X24 6PK STRL BLUE (TOWEL DISPOSABLE) ×2 IMPLANT
TOWEL OR NON WOVEN STRL DISP B (DISPOSABLE) ×2 IMPLANT

## 2018-01-13 NOTE — Anesthesia Postprocedure Evaluation (Signed)
Anesthesia Post Note  Patient: Lindsay [name]  Procedure(s) Performed: REMOVAL PORT-A-CATH (Right Chest)     Patient location during evaluation: PACU Anesthesia Type: MAC Level of consciousness: awake and alert Pain management: pain level controlled Vital Signs Assessment: post-procedure vital signs reviewed and stable Respiratory status: spontaneous breathing Cardiovascular status: stable Anesthetic complications: no    Last Vitals:  Vitals:   01/13/18 1300 01/13/18 1323  BP: (!) 139/49 (!) 161/61  Pulse: (!) 126 (!) 59  Resp: 14 18  Temp:  36.9 C  SpO2: 92% 94%    Last Pain:  Vitals:   01/13/18 1323  TempSrc:   PainSc: 0-No pain                 Nolon Nations

## 2018-01-13 NOTE — Op Note (Signed)
REMOVAL PORT-A-CATH  Procedure Note  Lindsay [name] 01/13/2018   Pre-op Diagnosis: History of Breast Cancer, Treatment completed     Post-op Diagnosis: same  Procedure(s): REMOVAL PORT-A-CATH  Surgeon(s): Coralie Keens, MD  Anesthesia: Monitor Anesthesia Care  Staff:  Circulator: Maurene Capes, RN Scrub Person: Lorenza Burton, CST  Estimated Blood Loss: Minimal               Procedure: The patient was brought to the operating room and identified as the correct patient.  She was placed supine on the operating room table and anesthesia was induced.  Her right chest was then prepped and draped in the usual sterile fashion.  I anesthetized her previous scar at the port site with lidocaine.  I made an incision with a scalpel.  I dissected down to the port which was easily identified.  The previously placed Prolene sutures were excised.  The port and the catheter were then easily removed intact.  I closed the catheter tract with a 3-0 Vicryl figure-of-eight suture.  I then closed the subcutaneous tissue with interrupted 3-0 Vicryl sutures and closed the skin with a running 4-0 Monocryl.  Dermabond was then applied.  The patient tolerated the procedure well.  All the counts were correct at the end of the procedure.  The patient was then taken in a stable condition to the recovery room.          Nyshaun Standage A   Date: 01/13/2018  Time: 12:22 PM

## 2018-01-13 NOTE — Interval H&P Note (Signed)
History and Physical Interval Note:no change in H and P  01/13/2018 11:57 AM  Lindsay [name]  has presented today for surgery, with the diagnosis of History of Breast Cancer, Treatment completed  The various methods of treatment have been discussed with the patient and family. After consideration of risks, benefits and other options for treatment, the patient has consented to  Procedure(s): [name] (N/A) as a surgical intervention .  The patient's history has been reviewed, patient examined, no change in status, stable for surgery.  I have reviewed the patient's chart and labs.  Questions were answered to the patient's satisfaction.     [name]

## 2018-01-13 NOTE — Discharge Instructions (Signed)
Ok to shower starting tomorrow  Ice pack, tylenol, ibuprofen also for pain   Post Anesthesia Home Care Instructions  Activity: Get plenty of rest for the remainder of the day. A responsible individual must stay with you for 24 hours following the procedure.  For the next 24 hours, DO NOT: -Drive a car -Paediatric nurse -Drink alcoholic beverages -Take any medication unless instructed by your physician -Make any legal decisions or sign important papers.  Meals: Start with liquid foods such as gelatin or soup. Progress to regular foods as tolerated. Avoid greasy, spicy, heavy foods. If nausea and/or vomiting occur, drink only clear liquids until the nausea and/or vomiting subsides. Call your physician if vomiting continues.  Special Instructions/Symptoms: Your throat may feel dry or sore from the anesthesia or the breathing tube placed in your throat during surgery. If this causes discomfort, gargle with warm salt water. The discomfort should disappear within 24 hours.  If you had a scopolamine patch placed behind your ear for the management of post- operative nausea and/or vomiting:  1. The medication in the patch is effective for 72 hours, after which it should be removed.  Wrap patch in a tissue and discard in the trash. Wash hands thoroughly with soap and water. 2. You may remove the patch earlier than 72 hours if you experience unpleasant side effects which may include dry mouth, dizziness or visual disturbances. 3. Avoid touching the patch. Wash your hands with soap and water after contact with the patch.

## 2018-01-13 NOTE — Anesthesia Preprocedure Evaluation (Addendum)
Anesthesia Evaluation  Patient identified by MRN, date of birth, ID band Patient awake    Reviewed: Allergy & Precautions, NPO status , Patient's Chart, lab work & pertinent test results, reviewed documented beta blocker date and time   Airway Mallampati: II  TM Distance: >3 FB Neck ROM: Full    Dental  (+) Teeth Intact, Dental Advisory Given   Pulmonary shortness of breath, former smoker,    Pulmonary exam normal breath sounds clear to auscultation       Cardiovascular hypertension, Pt. on home beta blockers and Pt. on medications Normal cardiovascular exam+ dysrhythmias  Rhythm:Regular Rate:Normal  Echo 06/23/17: - Left ventricle: The cavity size was normal. Wall thickness wasincreased in a pattern of moderate LVH. Systolic function wasnormal. The estimated ejection fraction was in the range of 60%to 65%. Wall motion was normal; there were no regional wallmotion abnormalities. Left ventricular diastolic functionparameters were normal. - Mitral valve: Calcified annulus. Mildly thickened leaflets . - Left atrium: The atrium was mildly dilated. - Atrial septum: There was increased thickness of the septum,consistent with lipomatous hypertrophy. No defect or patentforamen ovale was identified. - Impressions: Normal GLS - 18.6     Neuro/Psych negative neurological ROS  negative psych ROS   GI/Hepatic negative GI ROS, Neg liver ROS,   Endo/Other  Obesity   Renal/GU negative Renal ROS     Musculoskeletal negative musculoskeletal ROS (+)   Abdominal   Peds  Hematology  (+) Blood dyscrasia (Thrombocytopenia), anemia ,   Anesthesia Other Findings Day of surgery medications reviewed with the patient.  Left breast cancer  Reproductive/Obstetrics                             Anesthesia Physical  Anesthesia Plan  ASA: III  Anesthesia Plan: MAC   Post-op Pain Management:    Induction:  Intravenous  PONV Risk Score and Plan: 3 and Dexamethasone, Ondansetron and Treatment may vary due to age or medical condition  Airway Management Planned: Simple Face Mask  Additional Equipment:   Intra-op Plan:   Post-operative Plan:   Informed Consent: I have reviewed the patients History and Physical, chart, labs and discussed the procedure including the risks, benefits and alternatives for the proposed anesthesia with the patient or authorized representative who has indicated his/her understanding and acceptance.   Dental advisory given  Plan Discussed with: CRNA  Anesthesia Plan Comments:        Anesthesia Quick Evaluation

## 2018-01-13 NOTE — Transfer of Care (Signed)
Immediate Anesthesia Transfer of Care Note  Patient: Lindsay [name]  Procedure(s) Performed: REMOVAL PORT-A-CATH (Right Chest)  Patient Location: PACU  Anesthesia Type:MAC  Level of Consciousness: awake, alert  and oriented  Airway & Oxygen Therapy: Patient Spontanous Breathing and Patient connected to nasal cannula oxygen  Post-op Assessment: Report given to RN and Post -op Vital signs reviewed and stable  Post vital signs: Reviewed and stable  Last Vitals:  Vitals Value Taken Time  BP 119/48 01/13/2018 12:27 PM  Temp    Pulse 71 01/13/2018 12:29 PM  Resp 15 01/13/2018 12:29 PM  SpO2 96 % 01/13/2018 12:29 PM  Vitals shown include unvalidated device data.  Last Pain:  Vitals:   01/13/18 1118  TempSrc: Oral  PainSc: 0-No pain         Complications: No apparent anesthesia complications

## 2018-01-16 ENCOUNTER — Encounter (HOSPITAL_BASED_OUTPATIENT_CLINIC_OR_DEPARTMENT_OTHER): Payer: Self-pay | Admitting: Surgery

## 2018-01-25 ENCOUNTER — Telehealth: Payer: Self-pay | Admitting: Adult Health

## 2018-01-25 NOTE — Telephone Encounter (Signed)
Patient called to cancel °

## 2018-02-27 DIAGNOSIS — Z01818 Encounter for other preprocedural examination: Secondary | ICD-10-CM | POA: Diagnosis not present

## 2018-02-28 DIAGNOSIS — E785 Hyperlipidemia, unspecified: Secondary | ICD-10-CM | POA: Diagnosis not present

## 2018-02-28 DIAGNOSIS — I10 Essential (primary) hypertension: Secondary | ICD-10-CM | POA: Diagnosis not present

## 2018-02-28 DIAGNOSIS — Z0000 Encounter for general adult medical examination without abnormal findings: Secondary | ICD-10-CM | POA: Diagnosis not present

## 2018-02-28 DIAGNOSIS — Z79899 Other long term (current) drug therapy: Secondary | ICD-10-CM | POA: Diagnosis not present

## 2018-03-16 DIAGNOSIS — Z1211 Encounter for screening for malignant neoplasm of colon: Secondary | ICD-10-CM | POA: Diagnosis not present

## 2018-03-16 DIAGNOSIS — Z85828 Personal history of other malignant neoplasm of skin: Secondary | ICD-10-CM | POA: Diagnosis not present

## 2018-03-16 DIAGNOSIS — Z79899 Other long term (current) drug therapy: Secondary | ICD-10-CM | POA: Diagnosis not present

## 2018-03-16 DIAGNOSIS — Z09 Encounter for follow-up examination after completed treatment for conditions other than malignant neoplasm: Secondary | ICD-10-CM | POA: Diagnosis not present

## 2018-03-16 DIAGNOSIS — K648 Other hemorrhoids: Secondary | ICD-10-CM | POA: Diagnosis not present

## 2018-03-16 DIAGNOSIS — Z87891 Personal history of nicotine dependence: Secondary | ICD-10-CM | POA: Diagnosis not present

## 2018-03-16 DIAGNOSIS — Z800 Family history of malignant neoplasm of digestive organs: Secondary | ICD-10-CM | POA: Diagnosis not present

## 2018-03-16 DIAGNOSIS — I10 Essential (primary) hypertension: Secondary | ICD-10-CM | POA: Diagnosis not present

## 2018-03-16 DIAGNOSIS — E7800 Pure hypercholesterolemia, unspecified: Secondary | ICD-10-CM | POA: Diagnosis not present

## 2018-03-16 DIAGNOSIS — Z86010 Personal history of colonic polyps: Secondary | ICD-10-CM | POA: Diagnosis not present

## 2018-03-16 DIAGNOSIS — K5730 Diverticulosis of large intestine without perforation or abscess without bleeding: Secondary | ICD-10-CM | POA: Diagnosis not present

## 2018-03-16 DIAGNOSIS — Z853 Personal history of malignant neoplasm of breast: Secondary | ICD-10-CM | POA: Diagnosis not present

## 2018-04-21 ENCOUNTER — Encounter: Payer: Medicare Other | Admitting: Adult Health

## 2018-05-12 DIAGNOSIS — Z23 Encounter for immunization: Secondary | ICD-10-CM | POA: Diagnosis not present

## 2018-05-29 DIAGNOSIS — H353112 Nonexudative age-related macular degeneration, right eye, intermediate dry stage: Secondary | ICD-10-CM | POA: Diagnosis not present

## 2018-05-29 DIAGNOSIS — H2513 Age-related nuclear cataract, bilateral: Secondary | ICD-10-CM | POA: Diagnosis not present

## 2018-05-29 DIAGNOSIS — H524 Presbyopia: Secondary | ICD-10-CM | POA: Diagnosis not present

## 2018-05-29 DIAGNOSIS — H353123 Nonexudative age-related macular degeneration, left eye, advanced atrophic without subfoveal involvement: Secondary | ICD-10-CM | POA: Diagnosis not present

## 2018-05-29 DIAGNOSIS — H5203 Hypermetropia, bilateral: Secondary | ICD-10-CM | POA: Diagnosis not present

## 2018-05-31 DIAGNOSIS — I471 Supraventricular tachycardia, unspecified: Secondary | ICD-10-CM | POA: Insufficient documentation

## 2018-05-31 HISTORY — DX: Supraventricular tachycardia: I47.1

## 2018-05-31 HISTORY — DX: Supraventricular tachycardia, unspecified: I47.10

## 2018-07-11 ENCOUNTER — Ambulatory Visit: Payer: Medicare Other | Admitting: Cardiology

## 2018-07-14 DIAGNOSIS — C50912 Malignant neoplasm of unspecified site of left female breast: Secondary | ICD-10-CM | POA: Diagnosis not present

## 2018-07-18 DIAGNOSIS — H353132 Nonexudative age-related macular degeneration, bilateral, intermediate dry stage: Secondary | ICD-10-CM | POA: Diagnosis not present

## 2018-07-18 DIAGNOSIS — H2511 Age-related nuclear cataract, right eye: Secondary | ICD-10-CM | POA: Diagnosis not present

## 2018-07-18 DIAGNOSIS — H40033 Anatomical narrow angle, bilateral: Secondary | ICD-10-CM | POA: Diagnosis not present

## 2018-07-18 DIAGNOSIS — Z01818 Encounter for other preprocedural examination: Secondary | ICD-10-CM | POA: Diagnosis not present

## 2018-08-08 DIAGNOSIS — H259 Unspecified age-related cataract: Secondary | ICD-10-CM | POA: Diagnosis not present

## 2018-08-08 DIAGNOSIS — Z85828 Personal history of other malignant neoplasm of skin: Secondary | ICD-10-CM | POA: Diagnosis not present

## 2018-08-08 DIAGNOSIS — Z79899 Other long term (current) drug therapy: Secondary | ICD-10-CM | POA: Diagnosis not present

## 2018-08-08 DIAGNOSIS — E785 Hyperlipidemia, unspecified: Secondary | ICD-10-CM | POA: Diagnosis not present

## 2018-08-08 DIAGNOSIS — H2511 Age-related nuclear cataract, right eye: Secondary | ICD-10-CM | POA: Diagnosis not present

## 2018-08-08 DIAGNOSIS — H40033 Anatomical narrow angle, bilateral: Secondary | ICD-10-CM | POA: Diagnosis not present

## 2018-08-08 DIAGNOSIS — Z853 Personal history of malignant neoplasm of breast: Secondary | ICD-10-CM | POA: Diagnosis not present

## 2018-08-08 DIAGNOSIS — Z9221 Personal history of antineoplastic chemotherapy: Secondary | ICD-10-CM | POA: Diagnosis not present

## 2018-08-08 DIAGNOSIS — I10 Essential (primary) hypertension: Secondary | ICD-10-CM | POA: Diagnosis not present

## 2018-08-08 DIAGNOSIS — H353132 Nonexudative age-related macular degeneration, bilateral, intermediate dry stage: Secondary | ICD-10-CM | POA: Diagnosis not present

## 2018-08-08 DIAGNOSIS — M1990 Unspecified osteoarthritis, unspecified site: Secondary | ICD-10-CM | POA: Diagnosis not present

## 2018-08-08 DIAGNOSIS — Z87891 Personal history of nicotine dependence: Secondary | ICD-10-CM | POA: Diagnosis not present

## 2018-09-08 DIAGNOSIS — J0100 Acute maxillary sinusitis, unspecified: Secondary | ICD-10-CM | POA: Diagnosis not present

## 2018-09-12 DIAGNOSIS — H2512 Age-related nuclear cataract, left eye: Secondary | ICD-10-CM | POA: Diagnosis not present

## 2018-09-12 DIAGNOSIS — H259 Unspecified age-related cataract: Secondary | ICD-10-CM | POA: Diagnosis not present

## 2018-09-12 DIAGNOSIS — I10 Essential (primary) hypertension: Secondary | ICD-10-CM | POA: Diagnosis not present

## 2018-09-12 DIAGNOSIS — J449 Chronic obstructive pulmonary disease, unspecified: Secondary | ICD-10-CM | POA: Diagnosis not present

## 2018-09-12 DIAGNOSIS — Z853 Personal history of malignant neoplasm of breast: Secondary | ICD-10-CM | POA: Diagnosis not present

## 2018-09-12 DIAGNOSIS — Z79899 Other long term (current) drug therapy: Secondary | ICD-10-CM | POA: Diagnosis not present

## 2018-09-12 DIAGNOSIS — M1990 Unspecified osteoarthritis, unspecified site: Secondary | ICD-10-CM | POA: Diagnosis not present

## 2018-09-12 DIAGNOSIS — Z9221 Personal history of antineoplastic chemotherapy: Secondary | ICD-10-CM | POA: Diagnosis not present

## 2018-09-12 DIAGNOSIS — E785 Hyperlipidemia, unspecified: Secondary | ICD-10-CM | POA: Diagnosis not present

## 2018-09-12 DIAGNOSIS — Z87891 Personal history of nicotine dependence: Secondary | ICD-10-CM | POA: Diagnosis not present

## 2018-09-12 DIAGNOSIS — Z85828 Personal history of other malignant neoplasm of skin: Secondary | ICD-10-CM | POA: Diagnosis not present

## 2018-09-13 DIAGNOSIS — J069 Acute upper respiratory infection, unspecified: Secondary | ICD-10-CM | POA: Diagnosis not present

## 2018-10-02 DIAGNOSIS — J40 Bronchitis, not specified as acute or chronic: Secondary | ICD-10-CM | POA: Diagnosis not present

## 2018-10-25 DIAGNOSIS — L720 Epidermal cyst: Secondary | ICD-10-CM | POA: Diagnosis not present

## 2018-10-25 DIAGNOSIS — C44519 Basal cell carcinoma of skin of other part of trunk: Secondary | ICD-10-CM | POA: Diagnosis not present

## 2018-10-27 DIAGNOSIS — J449 Chronic obstructive pulmonary disease, unspecified: Secondary | ICD-10-CM | POA: Diagnosis not present

## 2018-10-27 DIAGNOSIS — E785 Hyperlipidemia, unspecified: Secondary | ICD-10-CM | POA: Diagnosis not present

## 2018-10-27 DIAGNOSIS — Z79899 Other long term (current) drug therapy: Secondary | ICD-10-CM | POA: Diagnosis not present

## 2018-10-27 DIAGNOSIS — I10 Essential (primary) hypertension: Secondary | ICD-10-CM | POA: Diagnosis not present

## 2019-02-09 DIAGNOSIS — C50912 Malignant neoplasm of unspecified site of left female breast: Secondary | ICD-10-CM | POA: Diagnosis not present

## 2019-02-12 DIAGNOSIS — I10 Essential (primary) hypertension: Secondary | ICD-10-CM | POA: Diagnosis not present

## 2019-04-02 DIAGNOSIS — Z1231 Encounter for screening mammogram for malignant neoplasm of breast: Secondary | ICD-10-CM | POA: Diagnosis not present

## 2019-04-11 ENCOUNTER — Other Ambulatory Visit: Payer: Self-pay

## 2019-04-11 ENCOUNTER — Encounter: Payer: Self-pay | Admitting: Cardiology

## 2019-04-11 ENCOUNTER — Ambulatory Visit (INDEPENDENT_AMBULATORY_CARE_PROVIDER_SITE_OTHER): Payer: Medicare Other | Admitting: Cardiology

## 2019-04-11 VITALS — BP 140/74 | HR 60 | Ht 67.0 in | Wt 288.8 lb

## 2019-04-11 DIAGNOSIS — R002 Palpitations: Secondary | ICD-10-CM | POA: Diagnosis not present

## 2019-04-11 DIAGNOSIS — E785 Hyperlipidemia, unspecified: Secondary | ICD-10-CM | POA: Insufficient documentation

## 2019-04-11 DIAGNOSIS — I10 Essential (primary) hypertension: Secondary | ICD-10-CM

## 2019-04-11 DIAGNOSIS — E782 Mixed hyperlipidemia: Secondary | ICD-10-CM

## 2019-04-11 HISTORY — DX: Hyperlipidemia, unspecified: E78.5

## 2019-04-11 MED ORDER — HYDRALAZINE HCL 25 MG PO TABS
25.0000 mg | ORAL_TABLET | Freq: Three times a day (TID) | ORAL | 3 refills | Status: DC
Start: 2019-04-11 — End: 2021-05-08

## 2019-04-11 MED ORDER — OLMESARTAN MEDOXOMIL-HCTZ 40-25 MG PO TABS: 1.0000 | ORAL_TABLET | Freq: Every day | ORAL | 3 refills | Status: DC

## 2019-04-11 MED ORDER — CLONIDINE HCL 0.1 MG PO TABS: 0.1000 mg | ORAL_TABLET | Freq: Two times a day (BID) | ORAL | 3 refills | Status: DC

## 2019-04-11 MED ORDER — ACEBUTOLOL HCL 200 MG PO CAPS
200.0000 mg | ORAL_CAPSULE | Freq: Two times a day (BID) | ORAL | 3 refills | Status: DC
Start: 2019-04-11 — End: 2020-04-22

## 2019-04-11 NOTE — Patient Instructions (Signed)
Medication Instructions:  Continue current medications.   If you need a refill on your cardiac medications before your next appointment, please call your pharmacy.   Lab work: No lab work today.  If you have labs (blood work) drawn today and your tests are completely normal, you will receive your results only by: Marland Kitchen MyChart Message (if you have MyChart) OR . A paper copy in the mail If you have any lab test that is abnormal or we need to change your treatment, we will call you to review the results.  Testing/Procedures: You had an EKG today.  Follow-Up: At [facility], you and your health needs are our priority.  As part of our continuing mission to provide you with exceptional heart care, we have created designated Provider Care Teams.  These Care Teams include your primary Cardiologist (physician) and Advanced Practice Providers (APPs -  Physician Assistants and Nurse Practitioners) who all work together to provide you with the care you need, when you need it. You will need a follow up appointment in 1 years.  Please call our office 2 months in advance to schedule this appointment.    Any Other Special Instructions Will Be Listed Below (If Applicable).  1. Avoid all over-the-counter antihistamines except Claritin/Loratadine and Zyrtec/Cetrizine. 2. Avoid all combination including cold sinus allergies flu decongestant and sleep medications 3. You can use Robitussin DM Mucinex and Mucinex DM for cough. 4. can use Tylenol aspirin ibuprofen and naproxen but no combinations such as sleep or sinus.

## 2019-04-11 NOTE — Progress Notes (Signed)
Cardiology Office Note:    Date:  04/11/2019   ID:  Lindsay [name], DOB [birth year], MRN [id number]  PCP:  Ronita Hipps, MD  Cardiologist:  Shirlee More, MD    Referring MD: Ronita Hipps, MD    ASSESSMENT:    1. Palpitations   2. Essential hypertension   3. Mixed hyperlipidemia    PLAN:    In order of problems listed above:  1. Stable she has had no recurrent atrial tachycardia continue beta-blocker 2. Stable BP at target.  Previously resistant continue her current multi drug regimen including beta-blocker centrally active clonidine and ARB diuretic along with hydralazine 3. Continue statin most recent lipid profile February 2020 cholesterol 161 LDL 86 HDL 53 creatinine 1.1   Next appointment: 1 year given prescription refill   Medication Adjustments/Labs and Tests Ordered: Current medicines are reviewed at length with the patient today.  Concerns regarding medicines are outlined above.  Orders Placed This Encounter  Procedures  . EKG 12-Lead   Meds ordered this encounter  Medications  . acebutolol (SECTRAL) 200 MG capsule    Sig: Take 1 capsule (200 mg total) by mouth 2 (two) times daily.    Dispense:  180 capsule    Refill:  3  . hydrALAZINE (APRESOLINE) 25 MG tablet    Sig: Take 1 tablet (25 mg total) by mouth 3 (three) times daily.    Dispense:  270 tablet    Refill:  3  . olmesartan-hydrochlorothiazide (BENICAR HCT) 40-25 MG tablet    Sig: Take 1 tablet by mouth daily.    Dispense:  90 tablet    Refill:  3  . cloNIDine (CATAPRES) 0.1 MG tablet    Sig: Take 1 tablet (0.1 mg total) by mouth 2 (two) times daily.    Dispense:  180 tablet    Refill:  3    Chief Complaint  Patient presents with  . Follow-up    for PAT    History of Present Illness:    Lindsay [name] is a 73 y.o. female with a hx of hypertension and palpitation/ PAT  last seen 11/01/2016. Compliance with diet, lifestyle and medications: Yes  She is seen today to renew her cardiac and  antihypertensive medications.  Home blood pressure is in range and tolerates medications without side effect.  She is done very well on her beta-blocker has had no recurrent rapid heart rhythms and no side effects.  No edema chest pain shortness of breath or syncope. Past Medical History:  Diagnosis Date  . Breast cancer ([place])   . Cancer [facility])    left breast cancer  . Dyspnea   . Dysrhythmia    PACs and nonsustained runs of atrial tach by Holter 10/13/16  . Family history of breast cancer   . Family history of colon cancer   . Family history of melanoma   . Family history of pancreatic cancer   . History of kidney stones   . Hypertension   . Personal history of colonic polyps   . Personal history of skin cancer     Past Surgical History:  Procedure Laterality Date  . COLONOSCOPY    . FOOT SURGERY Right   . IR CV LINE INJECTION  08/02/2017  . left breast lumpectomy  2000  . MASTECTOMY WITH AXILLARY LYMPH NODE DISSECTION Left 12/06/2017   Procedure: LEFT MASTECTOMY WITH TARGETED LYMPH NODE DISSECTION;  Surgeon: Coralie Keens, MD;  Location: [place];  Service: General;  Laterality: Left;  .  PORT-A-CATH REMOVAL Right 01/13/2018   Procedure: REMOVAL PORT-A-CATH;  Surgeon: Coralie Keens, MD;  Location: [place];  Service: General;  Laterality: Right;  . PORTACATH PLACEMENT Right 06/30/2017   Procedure: [place];  Surgeon: Coralie Keens, MD;  Location: [place];  Service: General;  Laterality: Right;    Current Medications: Current Meds  Medication Sig  . acebutolol (SECTRAL) 200 MG capsule Take 1 capsule (200 mg total) by mouth 2 (two) times daily.  . Ascorbic Acid (VITAMIN C) 1000 MG tablet Take 1,000 mg by mouth daily.  Marland Kitchen aspirin EC 81 MG tablet Take 81 mg by mouth daily.  Marland Kitchen atorvastatin (LIPITOR) 10 MG tablet Take 10 mg by mouth at bedtime.  . B Complex-C (SUPER B COMPLEX PO) Take 1 tablet by mouth daily.  Marland Kitchen BREO ELLIPTA 100-25 MCG/INH  AEPB INHALE 1 PUFF BY MOUTH ONCE DAILY  . Cholecalciferol (VITAMIN D3) 2000 units TABS Take 2,000 Units by mouth daily.   . cloNIDine (CATAPRES) 0.1 MG tablet Take 1 tablet (0.1 mg total) by mouth 2 (two) times daily.  . hydrALAZINE (APRESOLINE) 25 MG tablet Take 1 tablet (25 mg total) by mouth 3 (three) times daily.  Marland Kitchen loratadine (CLARITIN) 10 MG tablet Take 10 mg by mouth daily as needed for allergies.   . magnesium oxide (MAG-OX) 400 MG tablet Take 400 mg by mouth daily.  . montelukast (SINGULAIR) 10 MG tablet TAKE 1 (ONE) TABLET DAILY FOR ASTHMA  . Multiple Minerals-Vitamins (CITRACAL MAXIMUM PLUS PO) Take 1 tablet by mouth 2 (two) times daily.  . Multiple Vitamins-Minerals (PRESERVISION AREDS 2) CAPS Take 1 capsule by mouth 2 (two) times daily.  Marland Kitchen olmesartan-hydrochlorothiazide (BENICAR HCT) 40-25 MG tablet Take 1 tablet by mouth daily.  . Omega-3 Fatty Acids (FISH OIL) 1200 MG CAPS Take 1,200 mg by mouth 2 (two) times daily.  . [DISCONTINUED] acebutolol (SECTRAL) 200 MG capsule Take 200 mg by mouth 2 (two) times daily.  . [DISCONTINUED] cloNIDine (CATAPRES) 0.1 MG tablet Take 0.1 mg by mouth 2 (two) times daily.  . [DISCONTINUED] hydrALAZINE (APRESOLINE) 25 MG tablet Take 25 mg by mouth 3 (three) times daily.   . [DISCONTINUED] olmesartan-hydrochlorothiazide (BENICAR HCT) 40-25 MG tablet Take 1 tablet by mouth daily.     Allergies:   Patient has no known allergies.   Social History   Socioeconomic History  . Marital status: Married    Spouse name: Not on file  . Number of children: Not on file  . Years of education: Not on file  . Highest education level: Not on file  Occupational History  . Not on file  Social Needs  . Financial resource strain: Not on file  . Food insecurity    Worry: Not on file    Inability: Not on file  . Transportation needs    Medical: Not on file    Non-medical: Not on file  Tobacco Use  . Smoking status: Former Smoker    Packs/day: 2.00     Years: 50.00    Pack years: 100.00    Types: Cigarettes    Quit date: 06/02/2014    Years since quitting: 4.8  . Smokeless tobacco: Never Used  Substance and Sexual Activity  . Alcohol use: No  . Drug use: No  . Sexual activity: Yes  Lifestyle  . Physical activity    Days per week: Not on file    Minutes per session: Not on file  . Stress: Not on file  Relationships  . Social Herbalist on phone: Not on file    Gets together: Not on file    Attends religious service: Not on file    Active member of club or organization: Not on file    Attends meetings of clubs or organizations: Not on file    Relationship status: Not on file  Other Topics Concern  . Not on file  Social History Narrative  . Not on file     Family History: The patient's family history includes Basal cell carcinoma in her sister; Breast cancer in her paternal aunt; Breast cancer (age of onset: 56) in her sister; Breast cancer (age of onset: 84) in her sister; Colon cancer (age of onset: 61) in her sister; Melanoma in her sister; Pancreatic cancer (age of onset: 2) in her mother. ROS:   Please see the history of present illness.    All other systems reviewed and are negative.  EKGs/Labs/Other Studies Reviewed:    The following studies were reviewed today:  EKG:  EKG ordered today and personally reviewed.  The ekg ordered today demonstrates sinus rhythm and is normal  Recent Labs: No results found for requested labs within last 8760 hours.  Recent Lipid Panel No results found for: CHOL, TRIG, HDL, CHOLHDL, VLDL, LDLCALC, LDLDIRECT  Physical Exam:    VS:  BP 140/74 (BP Location: Right Arm, Patient Position: Sitting, Cuff Size: Large)   Pulse 60   Ht 5\' 7"  (1.702 m)   Wt 288 lb 12.8 oz (131 kg)   SpO2 98%   BMI 45.23 kg/m     Wt Readings from Last 3 Encounters:  04/11/19 288 lb 12.8 oz (131 kg)  01/13/18 245 lb (111.1 kg)  12/28/17 247 lb 9.6 oz (112.3 kg)     GEN:  Well nourished,  well developed in no acute distress HEENT: Normal NECK: No JVD; No carotid bruits LYMPHATICS: No lymphadenopathy CARDIAC: RRR, no murmurs, rubs, gallops RESPIRATORY:  Clear to auscultation without rales, wheezing or rhonchi  ABDOMEN: Soft, non-tender, non-distended MUSCULOSKELETAL:  No edema; No deformity  SKIN: Warm and dry NEUROLOGIC:  Alert and oriented x 3 PSYCHIATRIC:  Normal affect    Signed, Shirlee More, MD  04/11/2019 11:50 AM    [place]

## 2019-04-30 DIAGNOSIS — I10 Essential (primary) hypertension: Secondary | ICD-10-CM | POA: Diagnosis not present

## 2019-04-30 DIAGNOSIS — Z23 Encounter for immunization: Secondary | ICD-10-CM | POA: Diagnosis not present

## 2019-04-30 DIAGNOSIS — Z0000 Encounter for general adult medical examination without abnormal findings: Secondary | ICD-10-CM | POA: Diagnosis not present

## 2019-04-30 DIAGNOSIS — Z79899 Other long term (current) drug therapy: Secondary | ICD-10-CM | POA: Diagnosis not present

## 2019-04-30 DIAGNOSIS — M8580 Other specified disorders of bone density and structure, unspecified site: Secondary | ICD-10-CM | POA: Diagnosis not present

## 2019-04-30 DIAGNOSIS — Z9181 History of falling: Secondary | ICD-10-CM | POA: Diagnosis not present

## 2019-04-30 DIAGNOSIS — Z780 Asymptomatic menopausal state: Secondary | ICD-10-CM | POA: Diagnosis not present

## 2019-06-20 IMAGING — XA IR CENTRAL VENOUS CATHETER
4 series · 12 of 13 positions shown · IV contrast (IODINE)
Comparison: none

INDICATION: Right subclavian port catheter, difficult access, difficult blood
return, left breast cancer

[Series 2: care single · 1 of 1 slices shown]
[im 1/1]
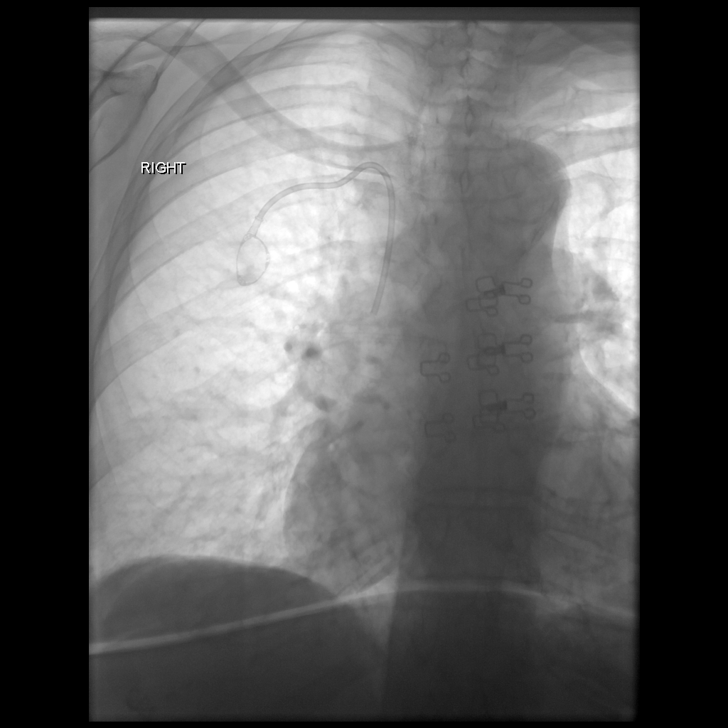

[Series 6: fl - angio · 4 of 38 frames shown]
[frame 2/38]
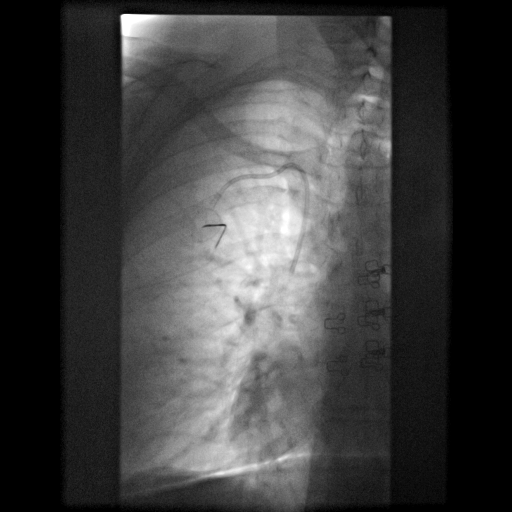
[frame 6/38]
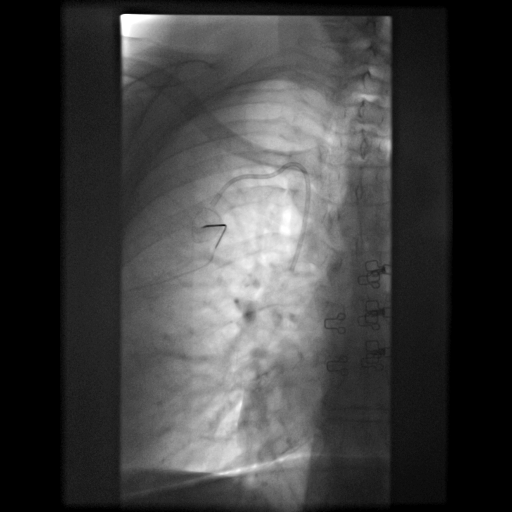
[frame 20/38]
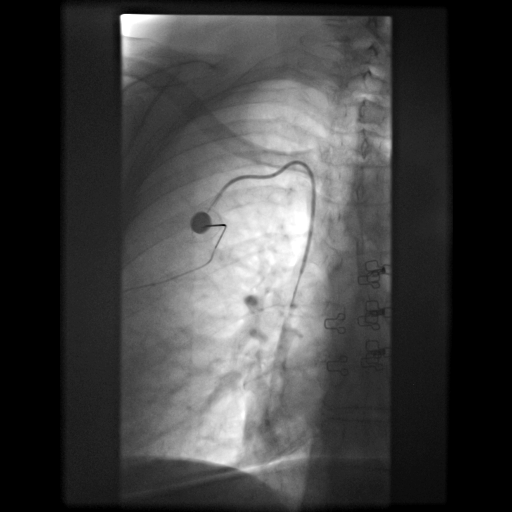
[frame 33/38]
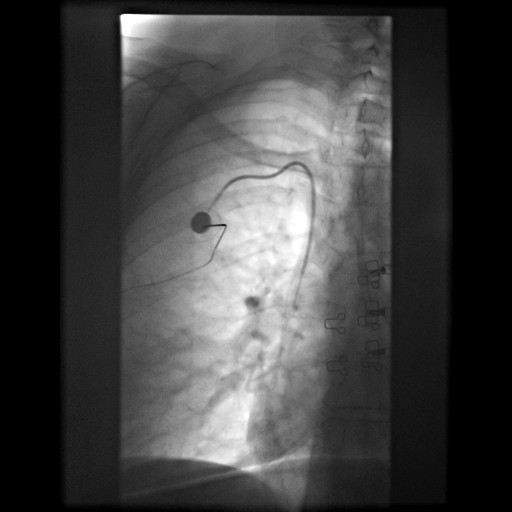

[Series 7: care body 4 · 3 of 13 frames shown]
[frame 1/13]
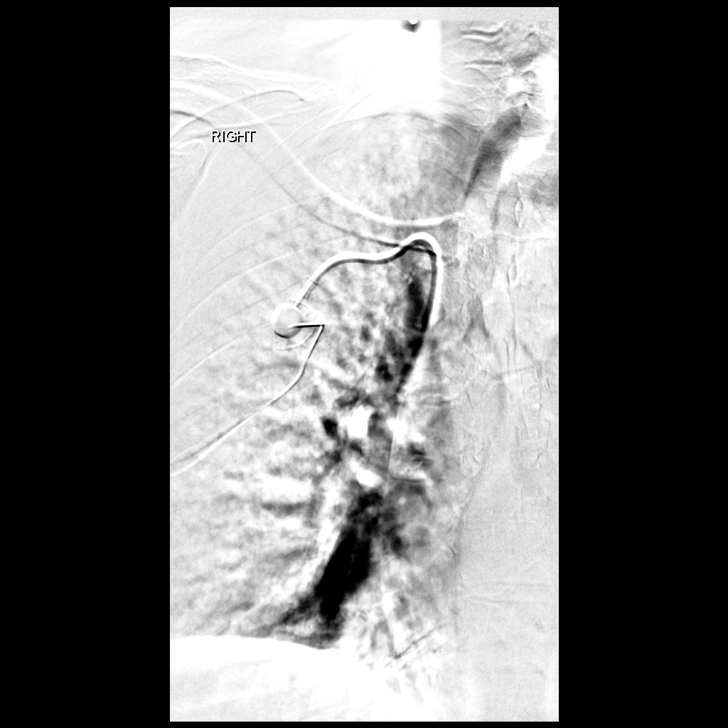
[frame 7/13]
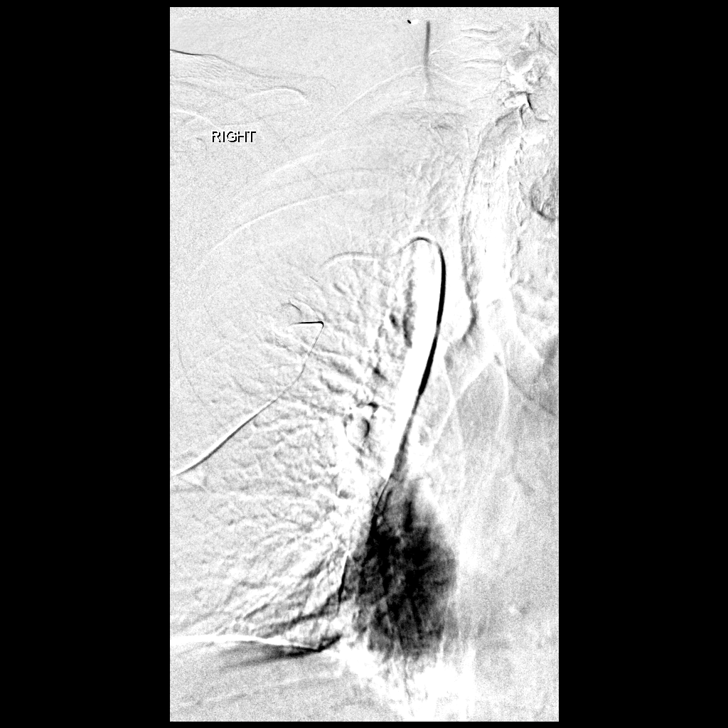
[frame 12/13]
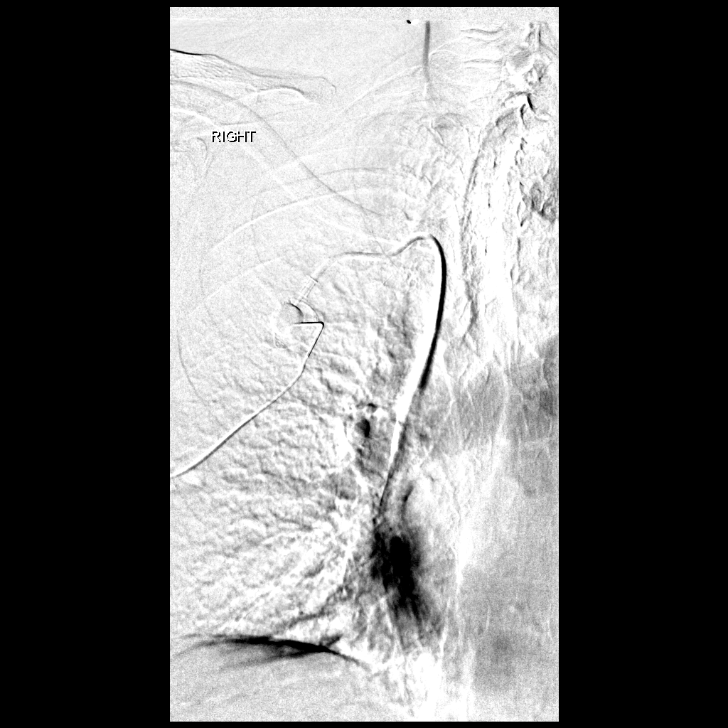

[Series 300: line placements · 4 of 4 slices shown]
[im 1/4]
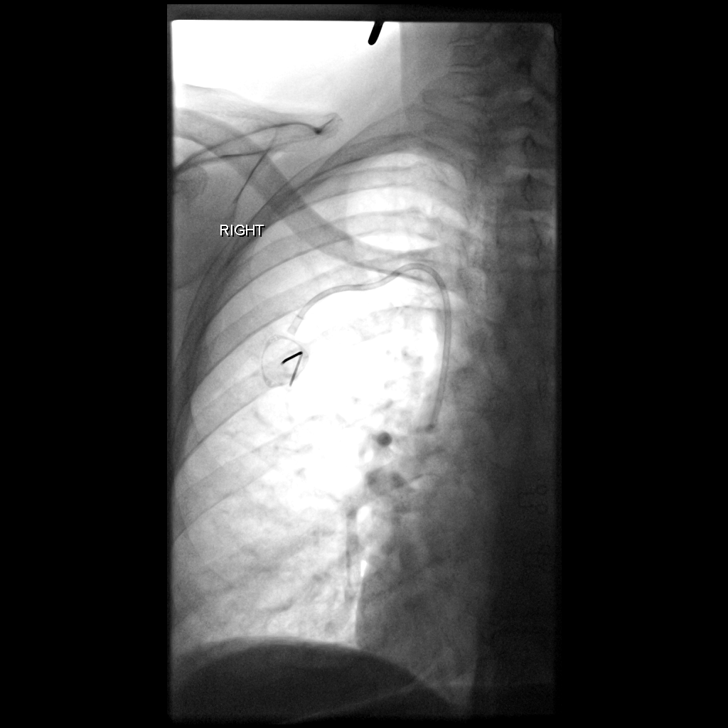
[im 2/4]
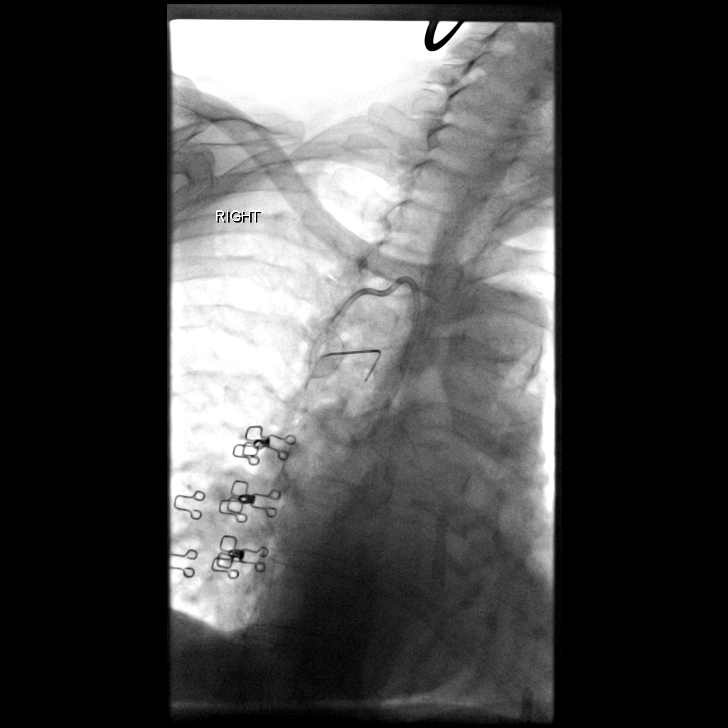
[im 3/4]
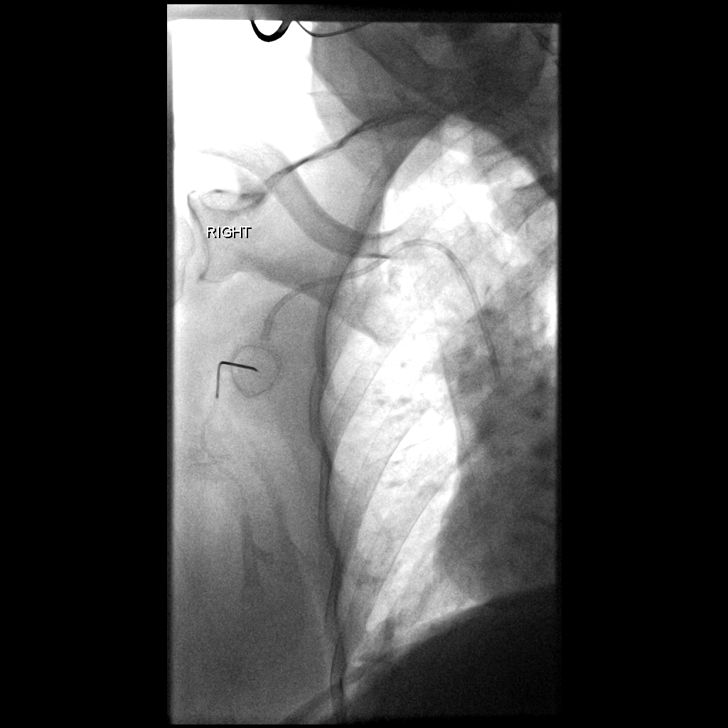
[im 4/4]
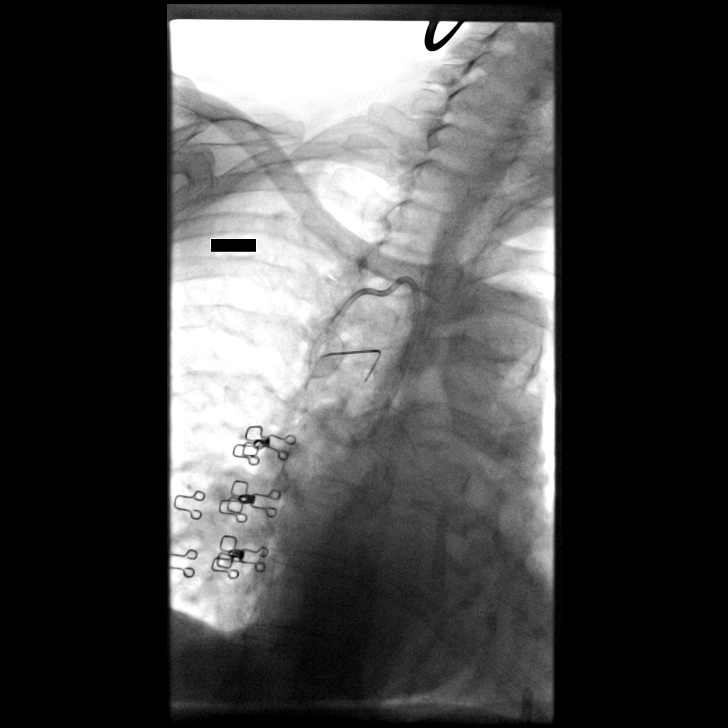

[12 of 13 positions shown; findings below may reference images not displayed]

EXAM:
Fluoroscopic injection of the right subclavian port catheter

MEDICATIONS:
None.

ANESTHESIA/SEDATION:
None.

FLUOROSCOPY TIME:  Fluoroscopy Time: 24 seconds (33 mGy).

COMPLICATIONS:
None immediate.

PROCEDURE:
Informed written consent was obtained from the patient after a
thorough discussion of the procedural risks, benefits and
alternatives. All questions were addressed. Maximal Sterile Barrier
Technique was utilized including caps, mask, sterile gowns, sterile
gloves, sterile drape, hand hygiene and skin antiseptic. A timeout
was performed prior to the initiation of the procedure.

Under fluoroscopy and sterile conditions, the right port catheter
was accessed. There was difficulty positioning the needle within the
port catheter because of the soft tissue depth. Needle was confirmed
within the port catheter. Blood was aspirated. Contrast injection
confirms patency of the port catheter. Tip is in the proximal SVC.
Negative for fiber sheath.
IMPRESSION: Right subclavian port catheter is patent.  Tip in the proximal SVC.

## 2019-06-21 DIAGNOSIS — H353132 Nonexudative age-related macular degeneration, bilateral, intermediate dry stage: Secondary | ICD-10-CM | POA: Diagnosis not present

## 2019-07-16 DIAGNOSIS — L570 Actinic keratosis: Secondary | ICD-10-CM | POA: Diagnosis not present

## 2019-07-16 DIAGNOSIS — C44519 Basal cell carcinoma of skin of other part of trunk: Secondary | ICD-10-CM | POA: Diagnosis not present

## 2019-07-16 DIAGNOSIS — L820 Inflamed seborrheic keratosis: Secondary | ICD-10-CM | POA: Diagnosis not present

## 2019-07-16 DIAGNOSIS — L821 Other seborrheic keratosis: Secondary | ICD-10-CM | POA: Diagnosis not present

## 2019-07-16 DIAGNOSIS — L578 Other skin changes due to chronic exposure to nonionizing radiation: Secondary | ICD-10-CM | POA: Diagnosis not present

## 2019-08-15 DIAGNOSIS — M2550 Pain in unspecified joint: Secondary | ICD-10-CM | POA: Diagnosis not present

## 2019-10-02 DIAGNOSIS — E7800 Pure hypercholesterolemia, unspecified: Secondary | ICD-10-CM | POA: Diagnosis not present

## 2019-10-02 DIAGNOSIS — Z0000 Encounter for general adult medical examination without abnormal findings: Secondary | ICD-10-CM | POA: Diagnosis not present

## 2019-10-02 DIAGNOSIS — Z79899 Other long term (current) drug therapy: Secondary | ICD-10-CM | POA: Diagnosis not present

## 2019-10-02 DIAGNOSIS — M109 Gout, unspecified: Secondary | ICD-10-CM | POA: Diagnosis not present

## 2020-01-24 DIAGNOSIS — M25562 Pain in left knee: Secondary | ICD-10-CM | POA: Diagnosis not present

## 2020-02-12 DIAGNOSIS — G8929 Other chronic pain: Secondary | ICD-10-CM

## 2020-02-12 DIAGNOSIS — M1712 Unilateral primary osteoarthritis, left knee: Secondary | ICD-10-CM | POA: Diagnosis not present

## 2020-02-12 DIAGNOSIS — M7122 Synovial cyst of popliteal space [Baker], left knee: Secondary | ICD-10-CM | POA: Insufficient documentation

## 2020-02-12 DIAGNOSIS — M25562 Pain in left knee: Secondary | ICD-10-CM | POA: Diagnosis not present

## 2020-02-12 HISTORY — DX: Synovial cyst of popliteal space (Baker), left knee: M71.22

## 2020-02-12 HISTORY — DX: Other chronic pain: G89.29

## 2020-03-21 DIAGNOSIS — C50912 Malignant neoplasm of unspecified site of left female breast: Secondary | ICD-10-CM | POA: Diagnosis not present

## 2020-04-08 DIAGNOSIS — Z9012 Acquired absence of left breast and nipple: Secondary | ICD-10-CM | POA: Diagnosis not present

## 2020-04-08 DIAGNOSIS — Z1231 Encounter for screening mammogram for malignant neoplasm of breast: Secondary | ICD-10-CM | POA: Diagnosis not present

## 2020-04-18 ENCOUNTER — Other Ambulatory Visit: Payer: Self-pay

## 2020-04-18 DIAGNOSIS — Z87442 Personal history of urinary calculi: Secondary | ICD-10-CM | POA: Insufficient documentation

## 2020-04-18 DIAGNOSIS — I499 Cardiac arrhythmia, unspecified: Secondary | ICD-10-CM | POA: Insufficient documentation

## 2020-04-18 DIAGNOSIS — I10 Essential (primary) hypertension: Secondary | ICD-10-CM | POA: Insufficient documentation

## 2020-04-18 DIAGNOSIS — C50919 Malignant neoplasm of unspecified site of unspecified female breast: Secondary | ICD-10-CM | POA: Insufficient documentation

## 2020-04-18 DIAGNOSIS — C801 Malignant (primary) neoplasm, unspecified: Secondary | ICD-10-CM | POA: Insufficient documentation

## 2020-04-18 DIAGNOSIS — R0600 Dyspnea, unspecified: Secondary | ICD-10-CM | POA: Insufficient documentation

## 2020-04-21 NOTE — Progress Notes (Signed)
Cardiology Office Note:    Date:  04/22/2020   ID:  Lindsay [name], DOB 1946/07/05, MRN [id number]  PCP:  Ronita Hipps, MD  Cardiologist:  Shirlee More, MD    Referring MD: Ronita Hipps, MD    ASSESSMENT:    1. PAT (paroxysmal atrial tachycardia) ([place])   2. Essential hypertension   3. Mixed hyperlipidemia    PLAN:    In order of problems listed above:  1. Stable well-controlled continue beta-blocker at this time I do not think she requires an event monitor 2. Stable continue her current multidrug regimen including ARB thiazide diuretic centrally active clonidine and beta-blocker along with hydralazine. 3. Stable ideal lipids continue her statin 4. She has a systolic ejection murmur I do not think she requires an echocardiogram and will watch and physical examination any signs of progression we will do it in the future   Next appointment: 1 year   Medication Adjustments/Labs and Tests Ordered: Current medicines are reviewed at length with the patient today.  Concerns regarding medicines are outlined above.  No orders of the defined types were placed in this encounter.  No orders of the defined types were placed in this encounter.   No chief complaint on file.   History of Present Illness:    Lindsay [name] is a 74 y.o. female with a hx of hypertension and palpitation/ PAT  last seen 04/11/2019. Compliance with diet, lifestyle and medications: Yes  She has done well no breakthrough episodes of atrial tachycardia and home blood pressure runs less than 140/80.  No chest pain shortness of breath edema or syncope.  Most recent labs done with her primary care physician 10/02/2019 shows a cholesterol 163 LDL 85 triglycerides 140 HDL 50 creatinine 1.20202 2021. Past Medical History:  Diagnosis Date  . Baker's cyst of knee, left 02/12/2020  . Breast cancer ([place])   . Cancer [facility])    left breast cancer  . Chronic pain of left knee 02/12/2020  . Dyspnea   . Dysrhythmia    PACs  and nonsustained runs of atrial tach by Holter 10/13/16  . Family history of breast cancer   . Family history of colon cancer   . Family history of melanoma   . Family history of pancreatic cancer   . History of kidney stones   . Hypertension   . Palpitation 02/01/2016  . PAT (paroxysmal atrial tachycardia) ([place]) 10/30/2016  . Personal history of colonic polyps   . Personal history of skin cancer   . SVT (supraventricular tachycardia) ([place]) 05/31/2018    Past Surgical History:  Procedure Laterality Date  . COLONOSCOPY    . FOOT SURGERY Right   . IR CV LINE INJECTION  08/02/2017  . left breast lumpectomy  2000  . MASTECTOMY WITH AXILLARY LYMPH NODE DISSECTION Left 12/06/2017   Procedure: LEFT MASTECTOMY WITH TARGETED LYMPH NODE DISSECTION;  Surgeon: Coralie Keens, MD;  Location: [place];  Service: General;  Laterality: Left;  . PORT-A-CATH REMOVAL Right 01/13/2018   Procedure: REMOVAL PORT-A-CATH;  Surgeon: Coralie Keens, MD;  Location: [place];  Service: General;  Laterality: Right;  . PORTACATH PLACEMENT Right 06/30/2017   Procedure: [place];  Surgeon: Coralie Keens, MD;  Location: [place];  Service: General;  Laterality: Right;    Current Medications: Current Meds  Medication Sig  . acebutolol (SECTRAL) 200 MG capsule Take 1 capsule (200 mg total) by mouth 2 (two) times daily.  Marland Kitchen allopurinol (ZYLOPRIM)  100 MG tablet Take 100 mg by mouth daily.  . Ascorbic Acid (VITAMIN C) 1000 MG tablet Take 1,000 mg by mouth daily.  Marland Kitchen aspirin EC 81 MG tablet Take 81 mg by mouth daily.  Marland Kitchen atorvastatin (LIPITOR) 10 MG tablet Take 10 mg by mouth at bedtime.  . B Complex-C (SUPER B COMPLEX PO) Take 1 tablet by mouth daily.  Marland Kitchen BREO ELLIPTA 100-25 MCG/INH AEPB INHALE 1 PUFF BY MOUTH ONCE DAILY  . Cholecalciferol (VITAMIN D3) 2000 units TABS Take 2,000 Units by mouth daily.   . cloNIDine (CATAPRES) 0.1 MG tablet Take 1 tablet (0.1 mg total) by mouth 2 (two)  times daily.  . hydrALAZINE (APRESOLINE) 25 MG tablet Take 1 tablet (25 mg total) by mouth 3 (three) times daily.  Marland Kitchen loratadine (CLARITIN) 10 MG tablet Take 10 mg by mouth daily as needed for allergies.   . magnesium oxide (MAG-OX) 400 MG tablet Take 400 mg by mouth daily.  . montelukast (SINGULAIR) 10 MG tablet TAKE 1 (ONE) TABLET DAILY FOR ASTHMA  . Multiple Minerals-Vitamins (CITRACAL MAXIMUM PLUS PO) Take 1 tablet by mouth 2 (two) times daily.  . Multiple Vitamins-Minerals (PRESERVISION AREDS 2) CAPS Take 1 capsule by mouth 2 (two) times daily.  Marland Kitchen olmesartan-hydrochlorothiazide (BENICAR HCT) 40-25 MG tablet Take 1 tablet by mouth daily.  . Omega-3 Fatty Acids (FISH OIL) 1200 MG CAPS Take 1,200 mg by mouth 2 (two) times daily.     Allergies:   Patient has no known allergies.   Social History   Socioeconomic History  . Marital status: Married    Spouse name: Not on file  . Number of children: Not on file  . Years of education: Not on file  . Highest education level: Not on file  Occupational History  . Not on file  Tobacco Use  . Smoking status: Former Smoker    Packs/day: 2.00    Years: 50.00    Pack years: 100.00    Types: Cigarettes    Quit date: 06/02/2014    Years since quitting: 5.8  . Smokeless tobacco: Never Used  Vaping Use  . Vaping Use: Never used  Substance and Sexual Activity  . Alcohol use: No  . Drug use: No  . Sexual activity: Yes  Other Topics Concern  . Not on file  Social History Narrative  . Not on file   Social Determinants of Health   Financial Resource Strain:   . Difficulty of Paying Living Expenses: Not on file  Food Insecurity:   . Worried About Charity fundraiser in the Last Year: Not on file  . Ran Out of Food in the Last Year: Not on file  Transportation Needs:   . Lack of Transportation (Medical): Not on file  . Lack of Transportation (Non-Medical): Not on file  Physical Activity:   . Days of Exercise per Week: Not on file  .  Minutes of Exercise per Session: Not on file  Stress:   . Feeling of Stress : Not on file  Social Connections:   . Frequency of Communication with Friends and Family: Not on file  . Frequency of Social Gatherings with Friends and Family: Not on file  . Attends Religious Services: Not on file  . Active Member of Clubs or Organizations: Not on file  . Attends Archivist Meetings: Not on file  . Marital Status: Not on file     Family History: The patient's family history includes Basal cell carcinoma in  her sister; Breast cancer in her paternal aunt; Breast cancer (age of onset: 63) in her sister; Breast cancer (age of onset: 84) in her sister; Colon cancer (age of onset: 65) in her sister; Melanoma in her sister; Pancreatic cancer (age of onset: 37) in her mother. ROS:   Please see the history of present illness.    All other systems reviewed and are negative.  EKGs/Labs/Other Studies Reviewed:    The following studies were reviewed today:  EKG:  EKG ordered today and personally reviewed.  The ekg ordered today demonstrates sinus rhythm and is normal  Recent Labs: No results found for requested labs within last 8760 hours.  Recent Lipid Panel No results found for: CHOL, TRIG, HDL, CHOLHDL, VLDL, LDLCALC, LDLDIRECT  Physical Exam:    VS:  BP (!) 160/80   Pulse 61   Ht 5\' 7"  (1.702 m)   Wt (!) 303 lb (137.4 kg)   SpO2 96%   BMI 47.46 kg/m     Wt Readings from Last 3 Encounters:  04/22/20 (!) 303 lb (137.4 kg)  04/11/19 288 lb 12.8 oz (131 kg)  01/13/18 245 lb (111.1 kg)     GEN:  Well nourished, well developed in no acute distress HEENT: Normal NECK: No JVD; No carotid bruits LYMPHATICS: No lymphadenopathy CARDIAC: Grade 1/6 to 2/6 midsystolic murmur, I checked her echocardiogram from 2018 the valve was normal.  RRR, no murmurs, rubs, gallops RESPIRATORY:  Clear to auscultation without rales, wheezing or rhonchi  ABDOMEN: Soft, non-tender,  non-distended MUSCULOSKELETAL:  No edema; No deformity  SKIN: Warm and dry NEUROLOGIC:  Alert and oriented x 3 PSYCHIATRIC:  Normal affect    Signed, Shirlee More, MD  04/22/2020 1:37 PM    [facility]

## 2020-04-22 ENCOUNTER — Ambulatory Visit: Payer: Medicare Other | Admitting: Cardiology

## 2020-04-22 ENCOUNTER — Encounter: Payer: Self-pay | Admitting: Cardiology

## 2020-04-22 ENCOUNTER — Other Ambulatory Visit: Payer: Self-pay

## 2020-04-22 VITALS — BP 160/80 | HR 61 | Ht 67.0 in | Wt 303.0 lb

## 2020-04-22 DIAGNOSIS — I471 Supraventricular tachycardia: Secondary | ICD-10-CM

## 2020-04-22 DIAGNOSIS — R002 Palpitations: Secondary | ICD-10-CM | POA: Diagnosis not present

## 2020-04-22 DIAGNOSIS — E782 Mixed hyperlipidemia: Secondary | ICD-10-CM

## 2020-04-22 DIAGNOSIS — I10 Essential (primary) hypertension: Secondary | ICD-10-CM | POA: Diagnosis not present

## 2020-04-22 DIAGNOSIS — R011 Cardiac murmur, unspecified: Secondary | ICD-10-CM | POA: Diagnosis not present

## 2020-04-22 MED ORDER — ACEBUTOLOL HCL 200 MG PO CAPS: 200.0000 mg | ORAL_CAPSULE | Freq: Two times a day (BID) | ORAL | 3 refills | Status: DC

## 2020-04-22 MED ORDER — CLONIDINE HCL 0.1 MG PO TABS: 0.1000 mg | ORAL_TABLET | Freq: Two times a day (BID) | ORAL | 3 refills | Status: DC

## 2020-04-22 NOTE — Addendum Note (Signed)
Addended by: Resa Miner I on: 04/22/2020 01:44 PM   Modules accepted: Orders

## 2020-04-22 NOTE — Patient Instructions (Signed)

## 2020-05-01 DIAGNOSIS — Z79899 Other long term (current) drug therapy: Secondary | ICD-10-CM | POA: Diagnosis not present

## 2020-05-01 DIAGNOSIS — M109 Gout, unspecified: Secondary | ICD-10-CM | POA: Diagnosis not present

## 2020-05-01 DIAGNOSIS — I10 Essential (primary) hypertension: Secondary | ICD-10-CM | POA: Diagnosis not present

## 2020-05-01 DIAGNOSIS — E7800 Pure hypercholesterolemia, unspecified: Secondary | ICD-10-CM | POA: Diagnosis not present

## 2020-05-01 DIAGNOSIS — Z23 Encounter for immunization: Secondary | ICD-10-CM | POA: Diagnosis not present

## 2020-05-12 DIAGNOSIS — L821 Other seborrheic keratosis: Secondary | ICD-10-CM | POA: Diagnosis not present

## 2020-05-12 DIAGNOSIS — L578 Other skin changes due to chronic exposure to nonionizing radiation: Secondary | ICD-10-CM | POA: Diagnosis not present

## 2020-05-12 DIAGNOSIS — C44729 Squamous cell carcinoma of skin of left lower limb, including hip: Secondary | ICD-10-CM | POA: Diagnosis not present

## 2020-05-12 DIAGNOSIS — L570 Actinic keratosis: Secondary | ICD-10-CM | POA: Diagnosis not present

## 2020-05-23 DIAGNOSIS — C44719 Basal cell carcinoma of skin of left lower limb, including hip: Secondary | ICD-10-CM | POA: Diagnosis not present

## 2020-06-18 DIAGNOSIS — C44729 Squamous cell carcinoma of skin of left lower limb, including hip: Secondary | ICD-10-CM | POA: Diagnosis not present

## 2020-07-02 DIAGNOSIS — R238 Other skin changes: Secondary | ICD-10-CM | POA: Diagnosis not present

## 2020-11-13 DIAGNOSIS — I10 Essential (primary) hypertension: Secondary | ICD-10-CM | POA: Diagnosis not present

## 2020-11-13 DIAGNOSIS — J449 Chronic obstructive pulmonary disease, unspecified: Secondary | ICD-10-CM | POA: Diagnosis not present

## 2020-11-13 DIAGNOSIS — I700 Atherosclerosis of aorta: Secondary | ICD-10-CM | POA: Diagnosis not present

## 2020-11-13 DIAGNOSIS — M109 Gout, unspecified: Secondary | ICD-10-CM | POA: Diagnosis not present

## 2020-11-13 DIAGNOSIS — Z0000 Encounter for general adult medical examination without abnormal findings: Secondary | ICD-10-CM | POA: Diagnosis not present

## 2020-11-13 DIAGNOSIS — E7800 Pure hypercholesterolemia, unspecified: Secondary | ICD-10-CM | POA: Diagnosis not present

## 2020-12-05 DIAGNOSIS — J449 Chronic obstructive pulmonary disease, unspecified: Secondary | ICD-10-CM | POA: Diagnosis not present

## 2021-01-28 DIAGNOSIS — H353132 Nonexudative age-related macular degeneration, bilateral, intermediate dry stage: Secondary | ICD-10-CM | POA: Diagnosis not present

## 2021-04-10 DIAGNOSIS — Z1231 Encounter for screening mammogram for malignant neoplasm of breast: Secondary | ICD-10-CM | POA: Diagnosis not present

## 2021-04-15 ENCOUNTER — Other Ambulatory Visit: Payer: Self-pay | Admitting: Cardiology

## 2021-04-15 DIAGNOSIS — I10 Essential (primary) hypertension: Secondary | ICD-10-CM

## 2021-05-01 ENCOUNTER — Other Ambulatory Visit: Payer: Self-pay

## 2021-05-08 ENCOUNTER — Other Ambulatory Visit: Payer: Self-pay

## 2021-05-08 ENCOUNTER — Ambulatory Visit: Payer: Medicare Other | Admitting: Cardiology

## 2021-05-08 ENCOUNTER — Ambulatory Visit (INDEPENDENT_AMBULATORY_CARE_PROVIDER_SITE_OTHER): Payer: Medicare Other

## 2021-05-08 ENCOUNTER — Encounter: Payer: Self-pay | Admitting: Cardiology

## 2021-05-08 VITALS — BP 170/94 | HR 74 | Ht 67.0 in | Wt 296.6 lb

## 2021-05-08 DIAGNOSIS — R0602 Shortness of breath: Secondary | ICD-10-CM

## 2021-05-08 DIAGNOSIS — I10 Essential (primary) hypertension: Secondary | ICD-10-CM | POA: Diagnosis not present

## 2021-05-08 DIAGNOSIS — I480 Paroxysmal atrial fibrillation: Secondary | ICD-10-CM

## 2021-05-08 DIAGNOSIS — I4891 Unspecified atrial fibrillation: Secondary | ICD-10-CM

## 2021-05-08 DIAGNOSIS — R011 Cardiac murmur, unspecified: Secondary | ICD-10-CM | POA: Diagnosis not present

## 2021-05-08 MED ORDER — HYDRALAZINE HCL 25 MG PO TABS: 37.5000 mg | ORAL_TABLET | Freq: Three times a day (TID) | ORAL | 3 refills | Status: DC

## 2021-05-08 MED ORDER — APIXABAN 5 MG PO TABS: 5.0000 mg | ORAL_TABLET | Freq: Two times a day (BID) | ORAL | 3 refills | Status: DC

## 2021-05-08 NOTE — Patient Instructions (Signed)
Medication Instructions:  Your physician has recommended you make the following change in your medication:  STOP: Aspirin  START: Eliquis 5 mg take one tablet by mouth twice daily.  INCREASE: Hydralazine 37.5 mg take 1.5  tablets by mouth three times daily.  *If you need a refill on your cardiac medications before your next appointment, please call your pharmacy*   Lab Work: Your physician recommends that you return for lab work in: [place] If you have labs (blood work) drawn today and your tests are completely normal, you will receive your results only by: MyChart Message (if you have [place]) OR A paper copy in the mail If you have any lab test that is abnormal or we need to change your treatment, we will call you to review the results.   Testing/Procedures: Your physician has requested that you have an echocardiogram. Echocardiography is a painless test that uses sound waves to create images of your heart. It provides your doctor with information about the size and shape of your heart and how well your heart's chambers and valves are working. This procedure takes approximately one hour. There are no restrictions for this procedure.  A zio monitor was ordered today. It will remain on for 7 days. You will then return monitor and event diary in provided box. It takes 1-2 weeks for report to be downloaded and returned to [place]. We will call you with the results. If monitor falls off or has orange flashing light, please call Zio for further instructions.     Follow-Up: At [facility], you and your health needs are our priority.  As part of our continuing mission to provide you with exceptional heart care, we have created designated Provider Care Teams.  These Care Teams include your primary Cardiologist (physician) and Advanced Practice Providers (APPs -  Physician Assistants and Nurse Practitioners) who all work together to provide you with the care you need, when you need it.  We  recommend signing up for the patient portal called "MyChart".  Sign up information is provided on this After Visit Summary.  MyChart is used to connect with patients for Virtual Visits (Telemedicine).  Patients are able to view lab/test results, encounter notes, upcoming appointments, etc.  Non-urgent messages can be sent to your provider as well.   To learn more about what you can do with MyChart, go to [email or web address].    Your next appointment:   4 week(s)  The format for your next appointment:   In Person  Provider:   Shirlee More, MD   Other Instructions

## 2021-05-08 NOTE — Progress Notes (Signed)
Cardiology Office Note:    Date:  05/08/2021   ID:  Lindsay [name], DOB [birth year], MRN [id number]  PCP:  Ronita Hipps, MD  Cardiologist:  Shirlee More, MD    Referring MD: Ronita Hipps, MD    ASSESSMENT:    1. Paroxysmal atrial fibrillation (HCC)   2. Essential hypertension   3. Murmur, cardiac   4. Atrial fibrillation, unspecified type ([place])   5. Shortness of breath    PLAN:    In order of problems listed above:  Today she is found to be in atrial fibrillation rate controlled on beta-blocker she will transition aspirin to Eliquis we will check a baseline CBC and renal function use a 7-day event monitor to decide if this is coming or going up fixed recheck echocardiogram for structural changes in particular the murmur suggesting element of aortic stenosis optimize her blood pressure treatment record daily bring next visit and reevaluate in the office in 3 to 4 weeks.   Next appointment: 3 to 4 weeks   Medication Adjustments/Labs and Tests Ordered: Current medicines are reviewed at length with the patient today.  Concerns regarding medicines are outlined above.  Orders Placed This Encounter  Procedures   EKG 12-Lead   No orders of the defined types were placed in this encounter.   Follow-up for atrial tachycardia and heart murmur  History of Present Illness:    Lindsay [name] is a 75 y.o. female with a hx of hypertension and paroxysmal atrial tachycardia last seen 04/22/2020.  At that time blood pressures well controlled on combination including clonidine beta-blocker ARB thiazide diuretic and hydralazine for resistant hypertension and lipids are at target on a statin.  He also has a heart murmur in 2018 echocardiogram showed mild aortic valve sclerosis also annular calcification mildly thickened leaflets mitral valve without stenosis.  Compliance with diet, lifestyle and medications: Yes  Her home blood pressure runs in the range of 150/80. She has had no awareness of  atrial fibrillation no warning on her blood pressure device she wonders if this is paroxysmal. Recently under increased stress with her sister having dementia. No edema chest pain shortness of breath or syncope. .  EKG confirms rate controlled atrial fibrillation. We discussed her increased risk of stroke female sex and age greater than 45 and hypertension and she agrees with anticoagulant therapy.  Aspirin. Past Medical History:  Diagnosis Date   Antineoplastic chemotherapy induced anemia 11/03/2017   Baker's cyst of knee, left 02/12/2020   Breast cancer ([place])    Cancer (HCC)    left breast cancer   Chronic pain of left knee 02/12/2020   Dyspnea    Dysrhythmia    PACs and nonsustained runs of atrial tach by Holter 10/13/16   Family history of breast cancer    Family history of colon cancer    Family history of melanoma    Family history of pancreatic cancer    Genetic testing 09/15/2017   The patient had genetic testing due to a personal history of breast cancer and skin cancer,  and a family history of breast cancer, pancreatic cancer, melanoma, and colon cancer.  The Common Hereditary Cancer Panel + Melanoma Panel + Basal Cell Nevus Syndrome Panel was ordered (58 genes).  The following genes were evaluated for sequence changes and exonic deletions/duplications: APC, ATM, AXIN2, B   History of kidney stones    History of left breast cancer 12/06/2017   Hyperlipidemia 04/11/2019   Hypertension  Malignant neoplasm of upper-inner quadrant of left breast in female, estrogen receptor negative ([place]) 06/21/2017   Palpitation 02/01/2016   PAT (paroxysmal atrial tachycardia) ([place]) 10/30/2016   Personal history of colonic polyps    Personal history of skin cancer    Port-A-Cath in place 07/01/2017   SVT (supraventricular tachycardia) ([place]) 05/31/2018    Past Surgical History:  Procedure Laterality Date   COLONOSCOPY     FOOT SURGERY Right    IR CV LINE INJECTION  08/02/2017   left breast lumpectomy   2000   MASTECTOMY WITH AXILLARY LYMPH NODE DISSECTION Left 12/06/2017   Procedure: LEFT MASTECTOMY WITH TARGETED LYMPH NODE DISSECTION;  Surgeon: Coralie Keens, MD;  Location: [place];  Service: General;  Laterality: Left;   PORT-A-CATH REMOVAL Right 01/13/2018   Procedure: REMOVAL PORT-A-CATH;  Surgeon: Coralie Keens, MD;  Location: [place];  Service: General;  Laterality: Right;   PORTACATH PLACEMENT Right 06/30/2017   Procedure: [place];  Surgeon: Coralie Keens, MD;  Location: [place];  Service: General;  Laterality: Right;    Current Medications: Current Meds  Medication Sig   acebutolol (SECTRAL) 200 MG capsule Take 1 capsule (200 mg total) by mouth 2 (two) times daily.   allopurinol (ZYLOPRIM) 100 MG tablet Take 100 mg by mouth daily.   Ascorbic Acid (VITAMIN C) 1000 MG tablet Take 1,000 mg by mouth daily.   atorvastatin (LIPITOR) 10 MG tablet Take 10 mg by mouth at bedtime.   B Complex-C (SUPER B COMPLEX PO) Take 1 tablet by mouth daily.   Cholecalciferol 25 MCG (1000 UT) tablet Take 1,000 Units by mouth daily.   cloNIDine (CATAPRES) 0.1 MG tablet TAKE 1 TABLET (0.1 MG TOTAL) BY MOUTH 2 (TWO) TIMES DAILY.   Magnesium 250 MG TABS Take 250 mg by mouth daily.   montelukast (SINGULAIR) 10 MG tablet TAKE 1 (ONE) TABLET DAILY FOR ASTHMA   Multiple Minerals-Vitamins (CITRACAL MAXIMUM PLUS PO) Take 1 tablet by mouth 2 (two) times daily.   Multiple Vitamins-Minerals (PRESERVISION AREDS 2) CAPS Take 1 capsule by mouth 2 (two) times daily.   olmesartan-hydrochlorothiazide (BENICAR HCT) 40-25 MG tablet Take 1 tablet by mouth daily.   Omega-3 1000 MG CAPS Take 2,000 mg by mouth 2 (two) times daily.   [DISCONTINUED] aspirin EC 81 MG tablet Take 81 mg by mouth daily.   [DISCONTINUED] hydrALAZINE (APRESOLINE) 25 MG tablet Take 1 tablet (25 mg total) by mouth 3 (three) times daily.     Allergies:   Patient has no known allergies.   Social History    Socioeconomic History   Marital status: Married    Spouse name: Not on file   Number of children: Not on file   Years of education: Not on file   Highest education level: Not on file  Occupational History   Not on file  Tobacco Use   Smoking status: Former    Packs/day: 2.00    Years: 50.00    Pack years: 100.00    Types: Cigarettes    Quit date: 06/02/2014    Years since quitting: 6.9   Smokeless tobacco: Never  Vaping Use   Vaping Use: Never used  Substance and Sexual Activity   Alcohol use: No   Drug use: No   Sexual activity: Yes  Other Topics Concern   Not on file  Social History Narrative   Not on file   Social Determinants of Health   Financial Resource Strain: Not on file  Food Insecurity: Not on file  Transportation Needs: Not on file  Physical Activity: Not on file  Stress: Not on file  Social Connections: Not on file     Family History: The patient's family history includes Basal cell carcinoma in her sister; Breast cancer in her paternal aunt; Breast cancer (age of onset: 72) in her sister; Breast cancer (age of onset: 75) in her sister; Colon cancer (age of onset: 3) in her sister; Melanoma in her sister; Pancreatic cancer (age of onset: 40) in her mother. ROS:   Please see the history of present illness.    All other systems reviewed and are negative.  EKGs/Labs/Other Studies Reviewed:    The following studies were reviewed today:  EKG:  EKG ordered today and personally reviewed.  The ekg ordered today demonstrates atrial fibrillation controlled rate  Recent Labs: 0 11/03/2020 cholesterol 137 HDL 39 triglycerides 106 A1c 5.8 creatinine 130  Physical Exam:    VS:  BP (!) 170/94   Pulse 74   Ht '5\' 7"'  (1.702 m)   Wt 296 lb 9.6 oz (134.5 kg)   SpO2 97%   BMI 46.45 kg/m     Wt Readings from Last 3 Encounters:  05/08/21 296 lb 9.6 oz (134.5 kg)  04/22/20 (!) 303 lb (137.4 kg)  04/11/19 288 lb 12.8 oz (131 kg)     GEN: BMI greater than 46  well nourished, well developed in no acute distress HEENT: Normal NECK: No JVD; No carotid bruits LYMPHATICS: No lymphadenopathy CARDIAC: Irregular rate and rhythm grade 2/6 aortic outflow murmur aortic area S2 is normal no AR rubs, gallops RESPIRATORY:  Clear to auscultation without rales, wheezing or rhonchi  ABDOMEN: Soft, non-tender, non-distended MUSCULOSKELETAL:  No edema; No deformity  SKIN: Warm and dry NEUROLOGIC:  Alert and oriented x 3 PSYCHIATRIC:  Normal affect    Signed, Shirlee More, MD  05/08/2021 2:32 PM    [facility]

## 2021-05-09 ENCOUNTER — Other Ambulatory Visit: Payer: Self-pay | Admitting: Cardiology

## 2021-05-09 DIAGNOSIS — I10 Essential (primary) hypertension: Secondary | ICD-10-CM

## 2021-05-09 LAB — CBC
Hematocrit: 40.6 % (ref 34.0–46.6)
Hemoglobin: 13.3 g/dL (ref 11.1–15.9)
MCH: 29.4 pg (ref 26.6–33.0)
MCHC: 32.8 g/dL (ref 31.5–35.7)
MCV: 90 fL (ref 79–97)
Platelets: 190 10*3/uL (ref 150–450)
RBC: 4.52 x10E6/uL (ref 3.77–5.28)
RDW: 13.8 % (ref 11.7–15.4)
WBC: 10.1 10*3/uL (ref 3.4–10.8)

## 2021-05-09 LAB — PRO B NATRIURETIC PEPTIDE: NT-Pro BNP: 2223 pg/mL — ABNORMAL HIGH (ref 0–738)

## 2021-05-11 ENCOUNTER — Telehealth: Payer: Self-pay

## 2021-05-11 NOTE — Telephone Encounter (Signed)
-----   Message from Richardo Priest, MD sent at 05/10/2021 11:47 AM EDT ----- Normal or stable result  Good result hemoglobin and platelets are normal continue Eliquis

## 2021-05-11 NOTE — Telephone Encounter (Signed)
Clonidine 0.1 mg # 60 x 5 refills sent to  [id number] - [place], [place] - [address]

## 2021-05-11 NOTE — Telephone Encounter (Signed)
Spoke with patient regarding results and recommendation.  Patient verbalizes understanding but tells me that she can not take this Eliquis because she can not afford it even with her insurance coverage. I asked if she would be willing to fill out patient assistance forms and she tells me no. She also tells me that she would not be willing to switch to anything else because she does not think she needs a blood thinner. I advised her that being in atrial fibrillation she needed to take an anticoagulant but she states she will not.   I will route to Dr. Bettina Gavia to let him know.

## 2021-05-15 DIAGNOSIS — I4891 Unspecified atrial fibrillation: Secondary | ICD-10-CM

## 2021-05-18 DIAGNOSIS — J449 Chronic obstructive pulmonary disease, unspecified: Secondary | ICD-10-CM | POA: Diagnosis not present

## 2021-05-18 DIAGNOSIS — I10 Essential (primary) hypertension: Secondary | ICD-10-CM | POA: Diagnosis not present

## 2021-05-18 DIAGNOSIS — I700 Atherosclerosis of aorta: Secondary | ICD-10-CM | POA: Diagnosis not present

## 2021-05-18 DIAGNOSIS — Z23 Encounter for immunization: Secondary | ICD-10-CM | POA: Diagnosis not present

## 2021-05-18 DIAGNOSIS — E7800 Pure hypercholesterolemia, unspecified: Secondary | ICD-10-CM | POA: Diagnosis not present

## 2021-05-21 ENCOUNTER — Other Ambulatory Visit: Payer: Self-pay

## 2021-05-21 ENCOUNTER — Ambulatory Visit (INDEPENDENT_AMBULATORY_CARE_PROVIDER_SITE_OTHER): Payer: Medicare Other

## 2021-05-21 DIAGNOSIS — I4891 Unspecified atrial fibrillation: Secondary | ICD-10-CM

## 2021-05-22 LAB — ECHOCARDIOGRAM COMPLETE
AR max vel: 0.75 cm2
AV Area VTI: 0.72 cm2
AV Area mean vel: 0.72 cm2
AV Mean grad: 25.5 mmHg
AV Peak grad: 42 mmHg
Ao pk vel: 3.24 m/s
Area-P 1/2: 6.12 cm2
Calc EF: 40.7 %
S' Lateral: 3.7 cm
Single Plane A2C EF: 42.2 %
Single Plane A4C EF: 37.9 %

## 2021-05-25 ENCOUNTER — Telehealth: Payer: Self-pay

## 2021-05-25 NOTE — Telephone Encounter (Signed)
-----   Message from Richardo Priest, MD sent at 05/24/2021  4:43 PM EDT ----- Your echocardiogram shows changes with thickening and restriction of the Dorothy aortic valve I will discuss this with her in the office in follow-up.

## 2021-05-25 NOTE — Telephone Encounter (Signed)
-----   Message from Richardo Priest, MD sent at 05/25/2021  8:45 AM EDT ----- She remains in atrial fibrillation the heart rate is well controlled.  She should have an office appointment follow-up for this and the echocardiogram.

## 2021-05-25 NOTE — Telephone Encounter (Signed)
Spoke with patient regarding results and recommendation.  Patient verbalizes understanding and is agreeable to plan of care. Advised patient to call back with any issues or concerns.  

## 2021-06-03 ENCOUNTER — Ambulatory Visit: Payer: Medicare Other | Admitting: Cardiology

## 2021-06-03 ENCOUNTER — Encounter: Payer: Self-pay | Admitting: Cardiology

## 2021-06-03 ENCOUNTER — Other Ambulatory Visit: Payer: Self-pay

## 2021-06-03 VITALS — BP 158/98 | HR 84 | Ht 67.0 in | Wt 296.6 lb

## 2021-06-03 DIAGNOSIS — I10 Essential (primary) hypertension: Secondary | ICD-10-CM

## 2021-06-03 DIAGNOSIS — E782 Mixed hyperlipidemia: Secondary | ICD-10-CM

## 2021-06-03 DIAGNOSIS — I471 Supraventricular tachycardia: Secondary | ICD-10-CM

## 2021-06-03 DIAGNOSIS — I350 Nonrheumatic aortic (valve) stenosis: Secondary | ICD-10-CM | POA: Diagnosis not present

## 2021-06-03 DIAGNOSIS — I4719 Other supraventricular tachycardia: Secondary | ICD-10-CM

## 2021-06-03 NOTE — Patient Instructions (Signed)
Medication Instructions:  Your physician recommends that you continue on your current medications as directed. Please refer to the Current Medication list given to you today.  *If you need a refill on your cardiac medications before your next appointment, please call your pharmacy*   Lab Work: None If you have labs (blood work) drawn today and your tests are completely normal, you will receive your results only by: [place] (if you have MyChart) OR A paper copy in the mail If you have any lab test that is abnormal or we need to change your treatment, we will call you to review the results.   Testing/Procedures: We placed the order for you to have a CT calcium score. They will call you to schedule this test.    Follow-Up: At [facility], you and your health needs are our priority.  As part of our continuing mission to provide you with exceptional heart care, we have created designated Provider Care Teams.  These Care Teams include your primary Cardiologist (physician) and Advanced Practice Providers (APPs -  Physician Assistants and Nurse Practitioners) who all work together to provide you with the care you need, when you need it.  We recommend signing up for the patient portal called "MyChart".  Sign up information is provided on this After Visit Summary.  MyChart is used to connect with patients for Virtual Visits (Telemedicine).  Patients are able to view lab/test results, encounter notes, upcoming appointments, etc.  Non-urgent messages can be sent to your provider as well.   To learn more about what you can do with MyChart, go to [email or web address].    Your next appointment:   3 month(s)  The format for your next appointment:   In Person  Provider:   Shirlee More, MD   Other Instructions

## 2021-06-03 NOTE — Progress Notes (Signed)
Cardiology Office Note:    Date:  06/03/2021   ID:  [name], DOB [birth year], MRN [id number]  PCP:  Ronita Hipps, MD  Cardiologist:  Shirlee More, MD    Referring MD: Ronita Hipps, MD    ASSESSMENT:    1. Nonrheumatic aortic valve stenosis   2. Essential hypertension   3. PAT (paroxysmal atrial tachycardia) ([place])   4. Mixed hyperlipidemia    PLAN:    In order of problems listed above:  I reviewed her echocardiogram I do not think that the stenosis is severe I would graded as moderate we will go ahead and do a calcium score of the aortic valve is greater than 1200 and female with alert [place] that she is hemodynamically severe stenosis and we need closer observation for symptoms for intervention. Improved blood pressures now at target with a combination including centrally acting clonidine hydralazine ARB thiazide diuretic and beta-blocker.  She will continue to monitor at home Stable no recurrence with beta-blocker continue Continue her statin   Next appointment: 3 months we will make a decision then regarding frequency of follow-up with aortic stenosis   Medication Adjustments/Labs and Tests Ordered: Current medicines are reviewed at length with the patient today.  Concerns regarding medicines are outlined above.  No orders of the defined types were placed in this encounter.  No orders of the defined types were placed in this encounter.   Chief Complaint  Patient presents with   Follow-up   Aortic Stenosis    History of Present Illness:    Lindsay [name] is a 75 y.o. female with a hx of hypertension and paroxysmal atrial tachycardia last seen 05/08/2021.  Compliance with diet, lifestyle and medications: Yes Since adjustment of medications blood pressure at home at target 130 145/80. She feels well no shortness of breath chest pain or syncope. I reviewed her echocardiogram I told her I think that her coronary calcium score attention aortic valvular heart to sort  out the severity of her aortic stenosis however she is asymptomatic and does not require intervention at this time. Tolerates her statin without muscle pain or weakness No recurrent tachycardia on her beta-blocker  She had an echocardiogram performed for heart murmur suggesting aortic stenosis 05/21/2021 showing AAS with reduced flow SVI 20 mean gradient 25 mmHg valve area 0.7 cm.  This was interpreted as severe I independently reviewed and feel she has moderate stenosis.  Left ventricle showed moderate concentric LVH EF 55 to 60% normal right ventricle with elevated pulmonary artery systolic pressure both atria are mildly enlarged and there is mild to moderate mitral regurgitation.  1. Left ventricular ejection fraction, by estimation, is 55 to 60%. The  left ventricle has normal function. The left ventricle has no regional  wall motion abnormalities. There is moderate left ventricular hypertrophy.  Left ventricular diastolic  parameters are consistent with Grade II diastolic dysfunction  (pseudonormalization).   2. Right ventricular systolic function is normal. The right ventricular  size is normal. There is moderately elevated pulmonary artery systolic  pressure.   3. Left atrial size was mildly dilated.   4. Right atrial size was mildly dilated.   5. The mitral valve is normal in structure. Mild to moderate mitral valve  regurgitation. No evidence of mitral stenosis.   6. Low flow severe AS (SVI 20). The aortic valve is normal in structure.  There is moderate calcification of the aortic valve. There is moderate  thickening of the aortic valve. Aortic  valve regurgitation is not  visualized. Severe aortic valve stenosis.  Aortic valve area, by VTI measures 0.72 cm. Aortic valve mean gradient  measures 25.5 mmHg.   7. The inferior vena cava is normal in size with greater than 50%  respiratory variability, suggesting right atrial pressure of 3 mmHg.  Past Medical History:  Diagnosis Date    Antineoplastic chemotherapy induced anemia 11/03/2017   Baker's cyst of knee, left 02/12/2020   Breast cancer ([place])    Cancer (HCC)    left breast cancer   Chronic pain of left knee 02/12/2020   Dyspnea    Dysrhythmia    PACs and nonsustained runs of atrial tach by Holter 10/13/16   Family history of breast cancer    Family history of colon cancer    Family history of melanoma    Family history of pancreatic cancer    Genetic testing 09/15/2017   The patient had genetic testing due to a personal history of breast cancer and skin cancer,  and a family history of breast cancer, pancreatic cancer, melanoma, and colon cancer.  The Common Hereditary Cancer Panel + Melanoma Panel + Basal Cell Nevus Syndrome Panel was ordered (58 genes).  The following genes were evaluated for sequence changes and exonic deletions/duplications: APC, ATM, AXIN2, B   History of kidney stones    History of left breast cancer 12/06/2017   Hyperlipidemia 04/11/2019   Hypertension    Malignant neoplasm of upper-inner quadrant of left breast in female, estrogen receptor negative ([place]) 06/21/2017   Palpitation 02/01/2016   PAT (paroxysmal atrial tachycardia) ([place]) 10/30/2016   Personal history of colonic polyps    Personal history of skin cancer    Port-A-Cath in place 07/01/2017   SVT (supraventricular tachycardia) ([place]) 05/31/2018    Past Surgical History:  Procedure Laterality Date   COLONOSCOPY     FOOT SURGERY Right    IR CV LINE INJECTION  08/02/2017   left breast lumpectomy  2000   MASTECTOMY WITH AXILLARY LYMPH NODE DISSECTION Left 12/06/2017   Procedure: LEFT MASTECTOMY WITH TARGETED LYMPH NODE DISSECTION;  Surgeon: Coralie Keens, MD;  Location: [place];  Service: General;  Laterality: Left;   PORT-A-CATH REMOVAL Right 01/13/2018   Procedure: REMOVAL PORT-A-CATH;  Surgeon: Coralie Keens, MD;  Location: [place];  Service: General;  Laterality: Right;   PORTACATH PLACEMENT Right 06/30/2017    Procedure: [place];  Surgeon: Coralie Keens, MD;  Location: [place];  Service: General;  Laterality: Right;    Current Medications: Current Meds  Medication Sig   acebutolol (SECTRAL) 200 MG capsule Take 1 capsule (200 mg total) by mouth 2 (two) times daily.   allopurinol (ZYLOPRIM) 100 MG tablet Take 100 mg by mouth daily.   Ascorbic Acid (VITAMIN C) 1000 MG tablet Take 1,000 mg by mouth daily.   atorvastatin (LIPITOR) 10 MG tablet Take 10 mg by mouth at bedtime.   B Complex-C (SUPER B COMPLEX PO) Take 1 tablet by mouth daily.   Cholecalciferol 25 MCG (1000 UT) tablet Take 1,000 Units by mouth daily.   cloNIDine (CATAPRES) 0.1 MG tablet TAKE 1 TABLET BY MOUTH 2 TIMES DAILY.   hydrALAZINE (APRESOLINE) 25 MG tablet Take 1.5 tablets (37.5 mg total) by mouth 3 (three) times daily.   Magnesium 250 MG TABS Take 250 mg by mouth daily.   montelukast (SINGULAIR) 10 MG tablet TAKE 1 (ONE) TABLET DAILY FOR ASTHMA   Multiple Minerals-Vitamins (CITRACAL MAXIMUM PLUS PO) Take 1  tablet by mouth 2 (two) times daily.   Multiple Vitamins-Minerals (PRESERVISION AREDS 2) CAPS Take 1 capsule by mouth 2 (two) times daily.   olmesartan-hydrochlorothiazide (BENICAR HCT) 40-25 MG tablet Take 1 tablet by mouth daily.   Omega-3 1000 MG CAPS Take 2,000 mg by mouth 2 (two) times daily.     Allergies:   Patient has no known allergies.   Social History   Socioeconomic History   Marital status: Married    Spouse name: Not on file   Number of children: Not on file   Years of education: Not on file   Highest education level: Not on file  Occupational History   Not on file  Tobacco Use   Smoking status: Former    Packs/day: 2.00    Years: 50.00    Pack years: 100.00    Types: Cigarettes    Quit date: 06/02/2014    Years since quitting: 7.0   Smokeless tobacco: Never  Vaping Use   Vaping Use: Never used  Substance and Sexual Activity   Alcohol use: No   Drug use: No   Sexual  activity: Yes  Other Topics Concern   Not on file  Social History Narrative   Not on file   Social Determinants of Health   Financial Resource Strain: Not on file  Food Insecurity: Not on file  Transportation Needs: Not on file  Physical Activity: Not on file  Stress: Not on file  Social Connections: Not on file     Family History: The patient's family history includes Basal cell carcinoma in her sister; Breast cancer in her paternal aunt; Breast cancer (age of onset: 71) in her sister; Breast cancer (age of onset: 16) in her sister; Colon cancer (age of onset: 43) in her sister; Melanoma in her sister; Pancreatic cancer (age of onset: 68) in her mother. ROS:   Please see the history of present illness.    All other systems reviewed and are negative.  EKGs/Labs/Other Studies Reviewed:    The following studies were reviewed today:    Recent Labs: 05/08/2021: Hemoglobin 13.3; NT-Pro BNP 2,223; Platelets 190  Recent Lipid Panel No results found for: CHOL, TRIG, HDL, CHOLHDL, VLDL, LDLCALC, LDLDIRECT  Physical Exam:    VS:  BP (!) 158/98 (BP Location: Right Arm, Patient Position: Sitting)   Pulse 84   Ht _0  (1.702 m)   Wt 296 lb 9.6 oz (134.5 kg)   SpO2 95%   BMI 46.45 kg/m     Wt Readings from Last 3 Encounters:  06/03/21 296 lb 9.6 oz (134.5 kg)  05/08/21 296 lb 9.6 oz (134.5 kg)  04/22/20 (!) 303 lb (137.4 kg)     GEN:  Well nourished, well developed in no acute distress HEENT: Normal NECK: No JVD; No carotid bruits LYMPHATICS: No lymphadenopathy CARDIAC: Grade 2/6 to 3/6 AAS murmur aortic area RRR, no murmurs, rubs, gallops RESPIRATORY:  Clear to auscultation without rales, wheezing or rhonchi  ABDOMEN: Soft, non-tender, non-distended MUSCULOSKELETAL:  No edema; No deformity  SKIN: Warm and dry NEUROLOGIC:  Alert and oriented x 3 PSYCHIATRIC:  Normal affect    Signed, Shirlee More, MD  06/03/2021 12:32 PM    [facility]

## 2021-06-03 NOTE — Addendum Note (Signed)
Addended by: Resa Miner I on: 06/03/2021 12:43 PM   Modules accepted: Orders

## 2021-06-11 ENCOUNTER — Other Ambulatory Visit: Payer: Self-pay | Admitting: Cardiology

## 2021-06-11 DIAGNOSIS — I350 Nonrheumatic aortic (valve) stenosis: Secondary | ICD-10-CM

## 2021-06-16 ENCOUNTER — Ambulatory Visit (INDEPENDENT_AMBULATORY_CARE_PROVIDER_SITE_OTHER)
Admission: RE | Admit: 2021-06-16 | Discharge: 2021-06-16 | Disposition: A | Discharge status: Home / Self Care | Payer: Self-pay | Source: Ambulatory Visit | Attending: Cardiology | Admitting: Cardiology

## 2021-06-16 ENCOUNTER — Other Ambulatory Visit: Payer: Self-pay

## 2021-06-16 DIAGNOSIS — I350 Nonrheumatic aortic (valve) stenosis: Secondary | ICD-10-CM

## 2021-06-17 ENCOUNTER — Telehealth: Payer: Self-pay

## 2021-06-17 NOTE — Telephone Encounter (Signed)
Left message on patients voicemail to please return our call.   

## 2021-06-17 NOTE — Telephone Encounter (Signed)
-----   Message from Richardo Priest, MD sent at 06/17/2021  8:26 AM EDT ----- Her Aortic valve score is similar to echo, moderate to severe, recheck echo march 2023 Would not advise considering TAVR at this time unless severe and symptomatic

## 2021-06-17 NOTE — Telephone Encounter (Signed)
Spoke with patient regarding results and recommendation.  Patient verbalizes understanding and is agreeable to plan of care. Advised patient to call back with any issues or concerns.  

## 2021-07-08 ENCOUNTER — Other Ambulatory Visit: Payer: Self-pay | Admitting: Cardiology

## 2021-07-08 DIAGNOSIS — R002 Palpitations: Secondary | ICD-10-CM

## 2021-09-07 ENCOUNTER — Ambulatory Visit: Payer: Medicare Other | Admitting: Cardiology

## 2021-09-08 DIAGNOSIS — J449 Chronic obstructive pulmonary disease, unspecified: Secondary | ICD-10-CM | POA: Diagnosis not present

## 2021-09-08 DIAGNOSIS — Z0000 Encounter for general adult medical examination without abnormal findings: Secondary | ICD-10-CM | POA: Diagnosis not present

## 2021-09-30 ENCOUNTER — Other Ambulatory Visit: Payer: Self-pay | Admitting: Cardiology

## 2021-09-30 DIAGNOSIS — I10 Essential (primary) hypertension: Secondary | ICD-10-CM

## 2021-10-04 NOTE — Progress Notes (Signed)
Cardiology Office Note:    Date:  10/05/2021   ID:  Lindsay [name], DOB 1945/10/29, MRN [id number]  PCP:  Lindsay Hipps, MD  Cardiologist:  Lindsay More, MD    Referring MD: Lindsay Hipps, MD    ASSESSMENT:    1. Nonrheumatic aortic valve stenosis   2. Essential hypertension   3. Mixed hyperlipidemia   4. PAT (paroxysmal atrial tachycardia) (HCC)    PLAN:    In order of problems listed above:  Clinically she is symptomatic from her aortic stenosis consistent with heart failure she has neck vein distention and orthopnea we will check labs including proBNP and start her on a low-dose of loop diuretic.  She is not ready to commit to intervention back in 4 weeks.  Her hemodynamics are consistent with severe aortic stenosis and her calcium score the valve is just shy of the cutoff of 1204 female If she is unimproved to let me know Stable continue current treatment on medical intensive I would not intensify her antihypertensives initiating a diuretic Continue statin with aortic stenosis Continue her beta-blocker   Next appointment: 4 weeks   Medication Adjustments/Labs and Tests Ordered: Current medicines are reviewed at length with the patient today.  Concerns regarding medicines are outlined above.  No orders of the defined types were placed in this encounter.  No orders of the defined types were placed in this encounter.   Chief Complaint  Patient presents with   Follow-up   Aortic Stenosis    History of Present Illness:    Lindsay [name] is a 76 y.o. female with a hx of aortic stenosis hypertension hyperlipidemia and paroxysmal atrial tachycardia last seen 06/03/2021.  At that time she was not having symptoms of heart failure chest pain or syncope.  Compliance with diet, lifestyle and medications: Yes  She is not feeling well there is a thought of whether or not she had bronchitis or pneumonia and received steroids bronchodilators transiently improved Although she is not  having edema she has orthopnea and she is short of breath even with ADLs has to stop and restShe has not had chest pain or syncope Present I discussed with the she is symptomatic her husband is present think she is symptomatic from her aortic stenosis she has mild neck vein distention on metoprolol low-dose loop diuretic and check labs including proBNP We discussed intervention particularly TAVR she is not ready to make a decision we will plan on her coming back to see me in 4 weeks and asked her to be in touch with me if she is unimproved. Overall her aortic stenosis is severe low-flow and has progressed She has had no fever chills cough sputum or bronchospasm  Echocardiogram performed 05/21/2021 showed aortic stenosis with reduced flow SVI with 20 cc/min mean gradient 25 mmHg peak gradient 42 mmHg VTI ratio 0.23 and valve area 0.7 cm.  Left ventricle showed moderate concentric LVH EF 55 to 60% and mild to moderate mitral regurgitation.  The aortic valve was described as moderately restricted calcified and thickened. Aortic valve calcium score 1169 with coronary calcium score 230 /70th percentile. Past Medical History:  Diagnosis Date   Antineoplastic chemotherapy induced anemia 11/03/2017   Baker's cyst of knee, left 02/12/2020   Breast cancer ([place])    Cancer (HCC)    left breast cancer   Chronic pain of left knee 02/12/2020   Dyspnea    Dysrhythmia    PACs and nonsustained runs of atrial tach by  Holter 10/13/16   Family history of breast cancer    Family history of colon cancer    Family history of melanoma    Family history of pancreatic cancer    Genetic testing 09/15/2017   The patient had genetic testing due to a personal history of breast cancer and skin cancer,  and a family history of breast cancer, pancreatic cancer, melanoma, and colon cancer.  The Common Hereditary Cancer Panel + Melanoma Panel + Basal Cell Nevus Syndrome Panel was ordered (58 genes).  The following genes were  evaluated for sequence changes and exonic deletions/duplications: APC, ATM, AXIN2, B   History of kidney stones    History of left breast cancer 12/06/2017   Hyperlipidemia 04/11/2019   Hypertension    Malignant neoplasm of upper-inner quadrant of left breast in female, estrogen receptor negative ([place]) 06/21/2017   Palpitation 02/01/2016   PAT (paroxysmal atrial tachycardia) ([place]) 10/30/2016   Personal history of colonic polyps    Personal history of skin cancer    Port-A-Cath in place 07/01/2017   SVT (supraventricular tachycardia) ([place]) 05/31/2018    Past Surgical History:  Procedure Laterality Date   COLONOSCOPY     FOOT SURGERY Right    IR CV LINE INJECTION  08/02/2017   left breast lumpectomy  2000   MASTECTOMY WITH AXILLARY LYMPH NODE DISSECTION Left 12/06/2017   Procedure: LEFT MASTECTOMY WITH TARGETED LYMPH NODE DISSECTION;  Surgeon: Lindsay Keens, MD;  Location: [place];  Service: General;  Laterality: Left;   PORT-A-CATH REMOVAL Right 01/13/2018   Procedure: REMOVAL PORT-A-CATH;  Surgeon: Lindsay Keens, MD;  Location: [place];  Service: General;  Laterality: Right;   PORTACATH PLACEMENT Right 06/30/2017   Procedure: [place];  Surgeon: Lindsay Keens, MD;  Location: [place];  Service: General;  Laterality: Right;    Current Medications: Current Meds  Medication Sig   acebutolol (SECTRAL) 200 MG capsule TAKE 1 CAPSULE BY MOUTH TWICE A DAY   albuterol (VENTOLIN HFA) 108 (90 Base) MCG/ACT inhaler Inhale 2 puffs into the lungs every 4 (four) hours as needed for shortness of breath or wheezing.   allopurinol (ZYLOPRIM) 100 MG tablet Take 100 mg by mouth daily.   Ascorbic Acid (VITAMIN C) 1000 MG tablet Take 1,000 mg by mouth daily.   aspirin EC 81 MG tablet Take 81 mg by mouth daily. Swallow whole.   atorvastatin (LIPITOR) 10 MG tablet Take 10 mg by mouth at bedtime.   B Complex-C (SUPER B COMPLEX PO) Take 1 tablet by mouth daily.   BREO  ELLIPTA 100-25 MCG/ACT AEPB Inhale 1 puff into the lungs daily.   Cholecalciferol 25 MCG (1000 UT) tablet Take 1,000 Units by mouth daily.   cloNIDine (CATAPRES) 0.1 MG tablet TAKE 1 TABLET BY MOUTH TWICE A DAY   ipratropium-albuterol (DUONEB) 0.5-2.5 (3) MG/3ML SOLN Take 3 mLs by nebulization 3 (three) times daily.   Magnesium 250 MG TABS Take 250 mg by mouth daily.   montelukast (SINGULAIR) 10 MG tablet TAKE 1 (ONE) TABLET DAILY FOR ASTHMA   Multiple Minerals-Vitamins (CITRACAL MAXIMUM PLUS PO) Take 1 tablet by mouth 2 (two) times daily.   Multiple Vitamins-Minerals (PRESERVISION AREDS 2) CAPS Take 1 capsule by mouth 2 (two) times daily.   olmesartan-hydrochlorothiazide (BENICAR HCT) 40-25 MG tablet Take 1 tablet by mouth daily.   Omega-3 1000 MG CAPS Take 2,000 mg by mouth 2 (two) times daily.     Allergies:   Patient has no known allergies.  Social History   Socioeconomic History   Marital status: Married    Spouse name: Not on file   Number of children: Not on file   Years of education: Not on file   Highest education level: Not on file  Occupational History   Not on file  Tobacco Use   Smoking status: Former    Packs/day: 2.00    Years: 50.00    Pack years: 100.00    Types: Cigarettes    Quit date: 06/02/2014    Years since quitting: 7.3   Smokeless tobacco: Never  Vaping Use   Vaping Use: Never used  Substance and Sexual Activity   Alcohol use: No   Drug use: No   Sexual activity: Yes  Other Topics Concern   Not on file  Social History Narrative   Not on file   Social Determinants of Health   Financial Resource Strain: Not on file  Food Insecurity: Not on file  Transportation Needs: Not on file  Physical Activity: Not on file  Stress: Not on file  Social Connections: Not on file     Family History: The patient's family history includes Basal cell carcinoma in her sister; Breast cancer in her paternal aunt; Breast cancer (age of onset: 38) in her sister;  Breast cancer (age of onset: 69) in her sister; Colon cancer (age of onset: 53) in her sister; Melanoma in her sister; Pancreatic cancer (age of onset: 81) in her mother. ROS:   Please see the history of present illness.    All other systems reviewed and are negative.  EKGs/Labs/Other Studies Reviewed:    The following studies were reviewed today:    Recent Labs: 05/08/2021: Hemoglobin 13.3; NT-Pro BNP 2,223; Platelets 190  11/13/2020 hemoglobin 13.9 Physical Exam:    VS:  BP (!) 166/82 (BP Location: Right Arm)    Pulse 86    Ht '5\' 7"'  (1.702 m)    Wt 295 lb 3.2 oz (133.9 kg)    SpO2 96%    BMI 46.23 kg/m     Wt Readings from Last 3 Encounters:  10/05/21 295 lb 3.2 oz (133.9 kg)  06/03/21 296 lb 9.6 oz (134.5 kg)  05/08/21 296 lb 9.6 oz (134.5 kg)     GEN: She is in a wheelchair today because of her shortness of breath well nourished, well developed in no acute distress HEENT: Normal NECK: Mild JVD; No carotid bruits LYMPHATICS: No lymphadenopathy CARDIAC: She has a murmur of severe aortic stenosis 2-3 of 6 harsh holosystolic radiates to the base of the carotids S2 is single no AR RRR, norubs, gallops RESPIRATORY:  Clear to auscultation without rales, wheezing or rhonchi  ABDOMEN: Soft, non-tender, non-distended MUSCULOSKELETAL:  No edema; No deformity  SKIN: Warm and dry NEUROLOGIC:  Alert and oriented x 3 PSYCHIATRIC:  Normal affect    Signed, Lindsay More, MD  10/05/2021 9:33 AM    [place]

## 2021-10-05 ENCOUNTER — Encounter: Payer: Self-pay | Admitting: Cardiology

## 2021-10-05 ENCOUNTER — Other Ambulatory Visit: Payer: Self-pay

## 2021-10-05 ENCOUNTER — Ambulatory Visit: Payer: Medicare Other | Admitting: Cardiology

## 2021-10-05 VITALS — BP 166/82 | HR 86 | Ht 67.0 in | Wt 295.2 lb

## 2021-10-05 DIAGNOSIS — I10 Essential (primary) hypertension: Secondary | ICD-10-CM

## 2021-10-05 DIAGNOSIS — I350 Nonrheumatic aortic (valve) stenosis: Secondary | ICD-10-CM

## 2021-10-05 DIAGNOSIS — E782 Mixed hyperlipidemia: Secondary | ICD-10-CM | POA: Diagnosis not present

## 2021-10-05 DIAGNOSIS — I471 Supraventricular tachycardia: Secondary | ICD-10-CM

## 2021-10-05 MED ORDER — FUROSEMIDE 20 MG PO TABS: 20.0000 mg | ORAL_TABLET | Freq: Every day | ORAL | 3 refills | Status: DC

## 2021-10-05 MED ORDER — POTASSIUM CHLORIDE ER 10 MEQ PO TBCR: 10.0000 meq | EXTENDED_RELEASE_TABLET | Freq: Every day | ORAL | 3 refills | Status: DC

## 2021-10-05 NOTE — Patient Instructions (Signed)
Medication Instructions:  Your physician has recommended you make the following change in your medication:   START: Furosemide 20 mg daily START: Potassium 10 mEq daily  *If you need a refill on your cardiac medications before your next appointment, please call your pharmacy*   Lab Work: Your physician recommends that you return for lab work in: Labs today: BMP, Pro BNP, CBC If you have labs (blood work) drawn today and your tests are completely normal, you will receive your results only by: MyChart Message (if you have MyChart) OR A paper copy in the mail If you have any lab test that is abnormal or we need to change your treatment, we will call you to review the results.   Testing/Procedures: None   Follow-Up: At [facility], you and your health needs are our priority.  As part of our continuing mission to provide you with exceptional heart care, we have created designated Provider Care Teams.  These Care Teams include your primary Cardiologist (physician) and Advanced Practice Providers (APPs -  Physician Assistants and Nurse Practitioners) who all work together to provide you with the care you need, when you need it.  We recommend signing up for the patient portal called "MyChart".  Sign up information is provided on this After Visit Summary.  MyChart is used to connect with patients for Virtual Visits (Telemedicine).  Patients are able to view lab/test results, encounter notes, upcoming appointments, etc.  Non-urgent messages can be sent to your provider as well.   To learn more about what you can do with MyChart, go to [email or web address].    Your next appointment:   4 week(s)  The format for your next appointment:   In Person  Provider:   Shirlee More, MD    Other Instructions None

## 2021-10-08 ENCOUNTER — Telehealth: Payer: Self-pay

## 2021-10-08 LAB — CBC
Hematocrit: 34.9 % (ref 34.0–46.6)
Hemoglobin: 11.8 g/dL (ref 11.1–15.9)
MCH: 29.3 pg (ref 26.6–33.0)
MCHC: 33.8 g/dL (ref 31.5–35.7)
MCV: 87 fL (ref 79–97)
Platelets: 220 10*3/uL (ref 150–450)
RBC: 4.03 x10E6/uL (ref 3.77–5.28)
RDW: 13.8 % (ref 11.7–15.4)
WBC: 11.2 10*3/uL — ABNORMAL HIGH (ref 3.4–10.8)

## 2021-10-08 LAB — BASIC METABOLIC PANEL
BUN/Creatinine Ratio: 17 (ref 12–28)
BUN: 23 mg/dL (ref 8–27)
CO2: 25 mmol/L (ref 20–29)
Calcium: 11.1 mg/dL — ABNORMAL HIGH (ref 8.7–10.3)
Chloride: 99 mmol/L (ref 96–106)
Creatinine, Ser: 1.34 mg/dL — ABNORMAL HIGH (ref 0.57–1.00)
Glucose: 107 mg/dL — ABNORMAL HIGH (ref 70–99)
Potassium: 3.6 mmol/L (ref 3.5–5.2)
Sodium: 139 mmol/L (ref 134–144)
eGFR: 41 mL/min/{1.73_m2} — ABNORMAL LOW (ref >59)

## 2021-10-08 LAB — PRO B NATRIURETIC PEPTIDE: NT-Pro BNP: 4997 pg/mL — ABNORMAL HIGH (ref 0–738)

## 2021-10-08 NOTE — Telephone Encounter (Signed)
-----   Message from Richardo Priest, MD sent at 10/08/2021 11:47 AM EST ----- Stable kidney disease continue her diuretic

## 2021-11-04 NOTE — Progress Notes (Signed)
Cardiology Office Note:    Date:  11/05/2021   ID:  Lindsay [name], DOB Oct 17, 1945, MRN [id number]  PCP:  Ronita Hipps, MD  Cardiologist:  Shirlee More, MD    Referring MD: Ronita Hipps, MD    ASSESSMENT:    1. Nonrheumatic aortic valve stenosis   2. Essential hypertension   3. Mixed hyperlipidemia   4. PAT (paroxysmal atrial tachycardia) (HCC)   5. Paroxysmal atrial fibrillation (HCC)    PLAN:    In order of problems listed above:  She has symptomatic severe aortic stenosis with heart failure.  At this time she does not want to do valve intervention she will take her diuretic when she thinks she needs it recheck renal function proBNP today we discussed the natural history she understands this is a progressive disorder including risk of sudden death and said she would keep an open mind and think further about valve intervention Stable I would not try to tighten blood pressure control with her severe aortic stenosis Continue her statin Having no recurrent atrial arrhythmia at this time, she is not anticoagulated   Next appointment: 3 months or sooner if she wants to pursue valve intervention   Medication Adjustments/Labs and Tests Ordered: Current medicines are reviewed at length with the patient today.  Concerns regarding medicines are outlined above.  No orders of the defined types were placed in this encounter.  No orders of the defined types were placed in this encounter.   Chief Complaint  Patient presents with   Aortic Stenosis    History of Present Illness:    Lindsay [name] is a 76 y.o. female with a hx of aortic stenosis with heart failure hypertension hyperlipidemia and atrial tachycardia last seen 10/05/2021.  She was initiated on a loop diuretic and I advised her to make a decision whether or not to pursue valvular intervention her hemodynamics are consistent with low-flow severe aortic stenosis and the aortic valve calcium score is just shy of the cutoff and  female at 78 I advised her to undergo left and right heart catheterization for evaluation for aortic stenosis.  Her proBNP level was markedly elevated at 4997 more than double previous 6 months ago  Compliance with diet, lifestyle and medications: Yes  Her husband is present participates in evaluation decision making She took her diuretic for perhaps a week she said she was very washed out she had a marked diuresis at 1 time her blood pressure is most than 093 systolic and she felt badly Since then she has no edema or shortness of breath Home blood pressure runs from 1 26-7 60 systolic on current medications We discussed the natural history of aortic stenosis and at this point in time she does not want to pursue valve intervention understands she will worsen and that there is a risk of sudden death. I have asked her to take her diuretic as needed in the future recheck renal function proBNP today I asked her if she changes her mind to contact me and I will see her in 3 months She also understands that if she progresses she may no longer be a candidate for valve intervention  Echocardiogram 05/21/2021  1. Left ventricular ejection fraction, by estimation, is 55 to 60%. The  left ventricle has normal function. The left ventricle has no regional  wall motion abnormalities. There is moderate left ventricular hypertrophy.  Left ventricular diastolic  parameters are consistent with Grade II diastolic dysfunction  (pseudonormalization).  2. Right ventricular systolic function is normal. The right ventricular  size is normal. There is moderately elevated pulmonary artery systolic  pressure.   3. Left atrial size was mildly dilated.   4. Right atrial size was mildly dilated.   5. The mitral valve is normal in structure. Mild to moderate mitral valve  regurgitation. No evidence of mitral stenosis.   6. Low flow severe AS (SVI 20).  There is moderate calcification of the aortic valve. There is  moderate  thickening of the aortic valve. Aortic valve regurgitation is not  visualized. Severe aortic valve stenosis.  Aortic valve area, by VTI measures 0.72 cm. Aortic valve mean gradient  measures 25.5 mmHg.  Past Medical History:  Diagnosis Date   Antineoplastic chemotherapy induced anemia 11/03/2017   Baker's cyst of knee, left 02/12/2020   Breast cancer ([place])    Cancer (HCC)    left breast cancer   Chronic pain of left knee 02/12/2020   Dyspnea    Dysrhythmia    PACs and nonsustained runs of atrial tach by Holter 10/13/16   Family history of breast cancer    Family history of colon cancer    Family history of melanoma    Family history of pancreatic cancer    Genetic testing 09/15/2017   The patient had genetic testing due to a personal history of breast cancer and skin cancer,  and a family history of breast cancer, pancreatic cancer, melanoma, and colon cancer.  The Common Hereditary Cancer Panel + Melanoma Panel + Basal Cell Nevus Syndrome Panel was ordered (58 genes).  The following genes were evaluated for sequence changes and exonic deletions/duplications: APC, ATM, AXIN2, B   History of kidney stones    History of left breast cancer 12/06/2017   Hyperlipidemia 04/11/2019   Hypertension    Malignant neoplasm of upper-inner quadrant of left breast in female, estrogen receptor negative ([place]) 06/21/2017   Palpitation 02/01/2016   PAT (paroxysmal atrial tachycardia) ([place]) 10/30/2016   Personal history of colonic polyps    Personal history of skin cancer    Port-A-Cath in place 07/01/2017   SVT (supraventricular tachycardia) ([place]) 05/31/2018    Past Surgical History:  Procedure Laterality Date   COLONOSCOPY     FOOT SURGERY Right    IR CV LINE INJECTION  08/02/2017   left breast lumpectomy  2000   MASTECTOMY WITH AXILLARY LYMPH NODE DISSECTION Left 12/06/2017   Procedure: LEFT MASTECTOMY WITH TARGETED LYMPH NODE DISSECTION;  Surgeon: Coralie Keens, MD;  Location: [place];  Service:  General;  Laterality: Left;   PORT-A-CATH REMOVAL Right 01/13/2018   Procedure: REMOVAL PORT-A-CATH;  Surgeon: Coralie Keens, MD;  Location: [place];  Service: General;  Laterality: Right;   PORTACATH PLACEMENT Right 06/30/2017   Procedure: [place];  Surgeon: Coralie Keens, MD;  Location: [place];  Service: General;  Laterality: Right;    Current Medications: No outpatient medications have been marked as taking for the 11/05/21 encounter (Office Visit) with Richardo Priest, MD.     Allergies:   Patient has no known allergies.   Social History   Socioeconomic History   Marital status: Married    Spouse name: Not on file   Number of children: Not on file   Years of education: Not on file   Highest education level: Not on file  Occupational History   Not on file  Tobacco Use   Smoking status: Former    Packs/day: 2.00  Years: 50.00    Pack years: 100.00    Types: Cigarettes    Quit date: 06/02/2014    Years since quitting: 7.4   Smokeless tobacco: Never  Vaping Use   Vaping Use: Never used  Substance and Sexual Activity   Alcohol use: No   Drug use: No   Sexual activity: Yes  Other Topics Concern   Not on file  Social History Narrative   Not on file   Social Determinants of Health   Financial Resource Strain: Not on file  Food Insecurity: Not on file  Transportation Needs: Not on file  Physical Activity: Not on file  Stress: Not on file  Social Connections: Not on file     Family History: The patient's family history includes Basal cell carcinoma in her sister; Breast cancer in her paternal aunt; Breast cancer (age of onset: 36) in her sister; Breast cancer (age of onset: 73) in her sister; Colon cancer (age of onset: 17) in her sister; Melanoma in her sister; Pancreatic cancer (age of onset: 75) in her mother. ROS:   Please see the history of present illness.    All other systems reviewed and are  negative.  EKGs/Labs/Other Studies Reviewed:    The following studies were reviewed today:    Recent Labs: 10/05/2021: BUN 23; Creatinine, Ser 1.34; Hemoglobin 11.8; NT-Pro BNP 4,997; Platelets 220; Potassium 3.6; Sodium 139  Recent Lipid Panel No results found for: CHOL, TRIG, HDL, CHOLHDL, VLDL, LDLCALC, LDLDIRECT  Physical Exam:    VS:  BP (!) 146/72 (BP Location: Right Arm)    Pulse 72    Ht '5\' 7"'  (1.702 m)    Wt 289 lb (131.1 kg)    SpO2 97%    BMI 45.26 kg/m     Wt Readings from Last 3 Encounters:  11/05/21 289 lb (131.1 kg)  10/05/21 295 lb 3.2 oz (133.9 kg)  06/03/21 296 lb 9.6 oz (134.5 kg)     GEN: She is in a wheelchair today she has difficulty ambulating well nourished, well developed in no acute distress HEENT: Normal NECK: No JVD; No carotid bruits LYMPHATICS: No lymphadenopathy CARDIAC: 3/6 murmur aortic stenosis holosystolic encompasses S2 radiates to the carotids diminished carotid volume and upstroke consistent with severe aortic stenosis RRR,  RESPIRATORY:  Clear to auscultation without rales, wheezing or rhonchi  ABDOMEN: Soft, non-tender, non-distended MUSCULOSKELETAL:  No edema; No deformity  SKIN: Warm and dry NEUROLOGIC:  Alert and oriented x 3 PSYCHIATRIC:  Normal affect    Signed, Shirlee More, MD  11/05/2021 10:35 AM    [place]

## 2021-11-05 ENCOUNTER — Other Ambulatory Visit: Payer: Self-pay

## 2021-11-05 ENCOUNTER — Ambulatory Visit: Payer: Medicare Other | Admitting: Cardiology

## 2021-11-05 ENCOUNTER — Encounter: Payer: Self-pay | Admitting: Cardiology

## 2021-11-05 VITALS — BP 146/72 | HR 72 | Ht 67.0 in | Wt 289.0 lb

## 2021-11-05 DIAGNOSIS — I480 Paroxysmal atrial fibrillation: Secondary | ICD-10-CM | POA: Diagnosis not present

## 2021-11-05 DIAGNOSIS — E782 Mixed hyperlipidemia: Secondary | ICD-10-CM | POA: Diagnosis not present

## 2021-11-05 DIAGNOSIS — I10 Essential (primary) hypertension: Secondary | ICD-10-CM | POA: Diagnosis not present

## 2021-11-05 DIAGNOSIS — I350 Nonrheumatic aortic (valve) stenosis: Secondary | ICD-10-CM | POA: Diagnosis not present

## 2021-11-05 DIAGNOSIS — I471 Supraventricular tachycardia: Secondary | ICD-10-CM

## 2021-11-05 NOTE — Patient Instructions (Addendum)
Medication Instructions:  ?Your physician has recommended you make the following change in your medication:  ?Take the furosemide daily as needed for fluid retention and swelling ? ?*If you need a refill on your cardiac medications before your next appointment, please call your pharmacy* ? ? ?Lab Work: ?Your physician recommends that you return for lab work in: Today for BMP and ProBnp ? ?If you have labs (blood work) drawn today and your tests are completely normal, you will receive your results only by: ?MyChart Message (if you have MyChart) OR ?A paper copy in the mail ?If you have any lab test that is abnormal or we need to change your treatment, we will call you to review the results. ? ? ?Testing/Procedures: ?NONE ? ? ?Follow-Up: ?At [facility], you and your health needs are our priority.  As part of our continuing mission to provide you with exceptional heart care, we have created designated Provider Care Teams.  These Care Teams include your primary Cardiologist (physician) and Advanced Practice Providers (APPs -  Physician Assistants and Nurse Practitioners) who all work together to provide you with the care you need, when you need it. ? ?We recommend signing up for the patient portal called "MyChart".  Sign up information is provided on this After Visit Summary.  MyChart is used to connect with patients for Virtual Visits (Telemedicine).  Patients are able to view lab/test results, encounter notes, upcoming appointments, etc.  Non-urgent messages can be sent to your provider as well.   ?To learn more about what you can do with MyChart, go to [email or web address].   ? ?Your next appointment:   ?3 month(s) ? ?The format for your next appointment:   ?In Person ? ?Provider:   ?Shirlee More, MD  ? ? ?Other Instructions ? Call our office if you decide to have valve correction. ? ? ?

## 2021-11-07 ENCOUNTER — Telehealth: Payer: Self-pay | Admitting: Nurse Practitioner

## 2021-11-07 DIAGNOSIS — N170 Acute kidney failure with tubular necrosis: Secondary | ICD-10-CM | POA: Diagnosis not present

## 2021-11-07 DIAGNOSIS — K5730 Diverticulosis of large intestine without perforation or abscess without bleeding: Secondary | ICD-10-CM | POA: Diagnosis not present

## 2021-11-07 DIAGNOSIS — Z79899 Other long term (current) drug therapy: Secondary | ICD-10-CM | POA: Diagnosis not present

## 2021-11-07 DIAGNOSIS — I340 Nonrheumatic mitral (valve) insufficiency: Secondary | ICD-10-CM | POA: Diagnosis not present

## 2021-11-07 DIAGNOSIS — M1990 Unspecified osteoarthritis, unspecified site: Secondary | ICD-10-CM | POA: Diagnosis not present

## 2021-11-07 DIAGNOSIS — I482 Chronic atrial fibrillation, unspecified: Secondary | ICD-10-CM | POA: Diagnosis not present

## 2021-11-07 DIAGNOSIS — Z853 Personal history of malignant neoplasm of breast: Secondary | ICD-10-CM | POA: Diagnosis not present

## 2021-11-07 DIAGNOSIS — D631 Anemia in chronic kidney disease: Secondary | ICD-10-CM | POA: Diagnosis not present

## 2021-11-07 DIAGNOSIS — I5032 Chronic diastolic (congestive) heart failure: Secondary | ICD-10-CM | POA: Diagnosis not present

## 2021-11-07 DIAGNOSIS — C50912 Malignant neoplasm of unspecified site of left female breast: Secondary | ICD-10-CM | POA: Diagnosis not present

## 2021-11-07 DIAGNOSIS — I130 Hypertensive heart and chronic kidney disease with heart failure and stage 1 through stage 4 chronic kidney disease, or unspecified chronic kidney disease: Secondary | ICD-10-CM | POA: Diagnosis not present

## 2021-11-07 DIAGNOSIS — N179 Acute kidney failure, unspecified: Secondary | ICD-10-CM | POA: Diagnosis not present

## 2021-11-07 DIAGNOSIS — I509 Heart failure, unspecified: Secondary | ICD-10-CM | POA: Diagnosis not present

## 2021-11-07 DIAGNOSIS — E785 Hyperlipidemia, unspecified: Secondary | ICD-10-CM | POA: Diagnosis not present

## 2021-11-07 DIAGNOSIS — N189 Chronic kidney disease, unspecified: Secondary | ICD-10-CM | POA: Diagnosis not present

## 2021-11-07 DIAGNOSIS — I110 Hypertensive heart disease with heart failure: Secondary | ICD-10-CM | POA: Diagnosis not present

## 2021-11-07 DIAGNOSIS — D72829 Elevated white blood cell count, unspecified: Secondary | ICD-10-CM | POA: Diagnosis not present

## 2021-11-07 DIAGNOSIS — J449 Chronic obstructive pulmonary disease, unspecified: Secondary | ICD-10-CM | POA: Diagnosis not present

## 2021-11-07 DIAGNOSIS — E876 Hypokalemia: Secondary | ICD-10-CM | POA: Diagnosis not present

## 2021-11-07 DIAGNOSIS — I517 Cardiomegaly: Secondary | ICD-10-CM | POA: Diagnosis not present

## 2021-11-07 DIAGNOSIS — I361 Nonrheumatic tricuspid (valve) insufficiency: Secondary | ICD-10-CM | POA: Diagnosis not present

## 2021-11-07 DIAGNOSIS — Z87891 Personal history of nicotine dependence: Secondary | ICD-10-CM | POA: Diagnosis not present

## 2021-11-07 DIAGNOSIS — R82998 Other abnormal findings in urine: Secondary | ICD-10-CM | POA: Diagnosis not present

## 2021-11-07 DIAGNOSIS — N281 Cyst of kidney, acquired: Secondary | ICD-10-CM | POA: Diagnosis not present

## 2021-11-07 NOTE — Telephone Encounter (Signed)
? ?  Pt contacted earlier this AM by Dr. Bettina Gavia due to abnl BMET w/ critical Ca2+ of 13.6.  Pt asymptomatic.  I rec that she present to the [facility] ED for repeat labs and probable treatment of hypercalcemia, this AM.  ? ?Lab Results  ?Component Value Date  ? CREATININE 2.46 (H) 11/05/2021  ? BUN 38 (H) 11/05/2021  ? NA 144 11/05/2021  ? K 3.4 (L) 11/05/2021  ? CL 102 11/05/2021  ? CO2 25 11/05/2021  ?  ? ?Caller verbalized understanding and was grateful for the call back. ? ?Murray Hodgkins, NP ?11/07/2021, 9:27 AM   ?

## 2021-11-09 LAB — BASIC METABOLIC PANEL
BUN/Creatinine Ratio: 15 (ref 12–28)
BUN: 38 mg/dL — ABNORMAL HIGH (ref 8–27)
CO2: 25 mmol/L (ref 20–29)
Calcium: 13.6 mg/dL (ref 8.7–10.3)
Chloride: 102 mmol/L (ref 96–106)
Creatinine, Ser: 2.46 mg/dL — ABNORMAL HIGH (ref 0.57–1.00)
Glucose: 120 mg/dL — ABNORMAL HIGH (ref 70–99)
Potassium: 3.4 mmol/L — ABNORMAL LOW (ref 3.5–5.2)
Sodium: 144 mmol/L (ref 134–144)
eGFR: 20 mL/min/{1.73_m2} — ABNORMAL LOW (ref >59)

## 2021-11-09 LAB — PRO B NATRIURETIC PEPTIDE: NT-Pro BNP: 4799 pg/mL — ABNORMAL HIGH (ref 0–738)

## 2021-11-11 ENCOUNTER — Telehealth: Payer: Self-pay | Admitting: Cardiology

## 2021-11-11 NOTE — Telephone Encounter (Signed)
Patient was told upon hospital discharge that she needs to make a sooner appt with Dr. Bettina Gavia. Provider did not have any open slots and patient is not sure what to do  ?

## 2021-11-13 DIAGNOSIS — J449 Chronic obstructive pulmonary disease, unspecified: Secondary | ICD-10-CM | POA: Diagnosis not present

## 2021-11-17 ENCOUNTER — Encounter: Payer: Self-pay | Admitting: Cardiology

## 2021-11-17 ENCOUNTER — Inpatient Hospital Stay (HOSPITAL_COMMUNITY)
Admission: EM | Admit: 2021-11-17 | Discharge: 2021-11-25 | DRG: 286 | Disposition: A | Discharge status: Home / Self Care | Payer: Medicare Other | Source: Ambulatory Visit | Attending: Cardiology | Admitting: Cardiology

## 2021-11-17 ENCOUNTER — Emergency Department (HOSPITAL_COMMUNITY): Payer: Medicare Other

## 2021-11-17 ENCOUNTER — Encounter (HOSPITAL_COMMUNITY): Payer: Self-pay

## 2021-11-17 ENCOUNTER — Telehealth: Payer: Self-pay

## 2021-11-17 ENCOUNTER — Other Ambulatory Visit: Payer: Self-pay

## 2021-11-17 ENCOUNTER — Ambulatory Visit: Payer: Medicare Other | Admitting: Cardiology

## 2021-11-17 VITALS — BP 159/80 | HR 96 | Ht 67.0 in | Wt 286.0 lb

## 2021-11-17 DIAGNOSIS — N1832 Chronic kidney disease, stage 3b: Secondary | ICD-10-CM | POA: Diagnosis present

## 2021-11-17 DIAGNOSIS — J449 Chronic obstructive pulmonary disease, unspecified: Secondary | ICD-10-CM | POA: Diagnosis not present

## 2021-11-17 DIAGNOSIS — I2510 Atherosclerotic heart disease of native coronary artery without angina pectoris: Secondary | ICD-10-CM | POA: Diagnosis not present

## 2021-11-17 DIAGNOSIS — I493 Ventricular premature depolarization: Secondary | ICD-10-CM | POA: Diagnosis not present

## 2021-11-17 DIAGNOSIS — Z808 Family history of malignant neoplasm of other organs or systems: Secondary | ICD-10-CM | POA: Diagnosis not present

## 2021-11-17 DIAGNOSIS — J811 Chronic pulmonary edema: Secondary | ICD-10-CM | POA: Diagnosis not present

## 2021-11-17 DIAGNOSIS — E782 Mixed hyperlipidemia: Secondary | ICD-10-CM | POA: Diagnosis not present

## 2021-11-17 DIAGNOSIS — Z803 Family history of malignant neoplasm of breast: Secondary | ICD-10-CM

## 2021-11-17 DIAGNOSIS — Z7901 Long term (current) use of anticoagulants: Secondary | ICD-10-CM | POA: Diagnosis not present

## 2021-11-17 DIAGNOSIS — E8779 Other fluid overload: Secondary | ICD-10-CM

## 2021-11-17 DIAGNOSIS — Z85828 Personal history of other malignant neoplasm of skin: Secondary | ICD-10-CM

## 2021-11-17 DIAGNOSIS — R778 Other specified abnormalities of plasma proteins: Secondary | ICD-10-CM | POA: Diagnosis not present

## 2021-11-17 DIAGNOSIS — I350 Nonrheumatic aortic (valve) stenosis: Secondary | ICD-10-CM

## 2021-11-17 DIAGNOSIS — Z87442 Personal history of urinary calculi: Secondary | ICD-10-CM

## 2021-11-17 DIAGNOSIS — M25562 Pain in left knee: Secondary | ICD-10-CM | POA: Diagnosis present

## 2021-11-17 DIAGNOSIS — Z86010 Personal history of colonic polyps: Secondary | ICD-10-CM | POA: Diagnosis not present

## 2021-11-17 DIAGNOSIS — Z9012 Acquired absence of left breast and nipple: Secondary | ICD-10-CM

## 2021-11-17 DIAGNOSIS — I272 Pulmonary hypertension, unspecified: Secondary | ICD-10-CM | POA: Diagnosis not present

## 2021-11-17 DIAGNOSIS — I5033 Acute on chronic diastolic (congestive) heart failure: Secondary | ICD-10-CM | POA: Diagnosis not present

## 2021-11-17 DIAGNOSIS — I10 Essential (primary) hypertension: Secondary | ICD-10-CM

## 2021-11-17 DIAGNOSIS — R7989 Other specified abnormal findings of blood chemistry: Secondary | ICD-10-CM

## 2021-11-17 DIAGNOSIS — Z20822 Contact with and (suspected) exposure to covid-19: Secondary | ICD-10-CM | POA: Diagnosis present

## 2021-11-17 DIAGNOSIS — N179 Acute kidney failure, unspecified: Secondary | ICD-10-CM | POA: Diagnosis present

## 2021-11-17 DIAGNOSIS — I471 Supraventricular tachycardia: Secondary | ICD-10-CM | POA: Diagnosis present

## 2021-11-17 DIAGNOSIS — E785 Hyperlipidemia, unspecified: Secondary | ICD-10-CM | POA: Diagnosis present

## 2021-11-17 DIAGNOSIS — Z87891 Personal history of nicotine dependence: Secondary | ICD-10-CM

## 2021-11-17 DIAGNOSIS — R000 Tachycardia, unspecified: Secondary | ICD-10-CM | POA: Diagnosis not present

## 2021-11-17 DIAGNOSIS — G8929 Other chronic pain: Secondary | ICD-10-CM | POA: Diagnosis not present

## 2021-11-17 DIAGNOSIS — Z79899 Other long term (current) drug therapy: Secondary | ICD-10-CM | POA: Diagnosis not present

## 2021-11-17 DIAGNOSIS — R0602 Shortness of breath: Secondary | ICD-10-CM | POA: Diagnosis not present

## 2021-11-17 DIAGNOSIS — I130 Hypertensive heart and chronic kidney disease with heart failure and stage 1 through stage 4 chronic kidney disease, or unspecified chronic kidney disease: Secondary | ICD-10-CM | POA: Diagnosis present

## 2021-11-17 DIAGNOSIS — E8770 Fluid overload, unspecified: Secondary | ICD-10-CM | POA: Diagnosis not present

## 2021-11-17 DIAGNOSIS — Z7951 Long term (current) use of inhaled steroids: Secondary | ICD-10-CM

## 2021-11-17 DIAGNOSIS — N189 Chronic kidney disease, unspecified: Secondary | ICD-10-CM | POA: Diagnosis not present

## 2021-11-17 DIAGNOSIS — I480 Paroxysmal atrial fibrillation: Secondary | ICD-10-CM | POA: Diagnosis not present

## 2021-11-17 DIAGNOSIS — R079 Chest pain, unspecified: Secondary | ICD-10-CM | POA: Diagnosis not present

## 2021-11-17 DIAGNOSIS — I5043 Acute on chronic combined systolic (congestive) and diastolic (congestive) heart failure: Secondary | ICD-10-CM | POA: Diagnosis not present

## 2021-11-17 DIAGNOSIS — R9431 Abnormal electrocardiogram [ECG] [EKG]: Secondary | ICD-10-CM | POA: Diagnosis not present

## 2021-11-17 DIAGNOSIS — I5030 Unspecified diastolic (congestive) heart failure: Secondary | ICD-10-CM | POA: Diagnosis not present

## 2021-11-17 DIAGNOSIS — R0989 Other specified symptoms and signs involving the circulatory and respiratory systems: Principal | ICD-10-CM

## 2021-11-17 DIAGNOSIS — I4819 Other persistent atrial fibrillation: Secondary | ICD-10-CM | POA: Diagnosis not present

## 2021-11-17 DIAGNOSIS — I517 Cardiomegaly: Secondary | ICD-10-CM | POA: Diagnosis not present

## 2021-11-17 HISTORY — DX: Nonrheumatic aortic (valve) stenosis: I35.0

## 2021-11-17 LAB — RESP PANEL BY RT-PCR (FLU A&B, COVID) ARPGX2
Influenza A by PCR: NEGATIVE
Influenza B by PCR: NEGATIVE
SARS Coronavirus 2 by RT PCR: NEGATIVE

## 2021-11-17 LAB — BASIC METABOLIC PANEL
Anion gap: 14 (ref 5–15)
BUN: 21 mg/dL (ref 8–23)
CO2: 22 mmol/L (ref 22–32)
Calcium: 8.9 mg/dL (ref 8.9–10.3)
Chloride: 103 mmol/L (ref 98–111)
Creatinine, Ser: 1.57 mg/dL — ABNORMAL HIGH (ref 0.44–1.00)
GFR, Estimated: 34 mL/min — ABNORMAL LOW (ref >60)
Glucose, Bld: 108 mg/dL — ABNORMAL HIGH (ref 70–99)
Potassium: 3.2 mmol/L — ABNORMAL LOW (ref 3.5–5.1)
Sodium: 139 mmol/L (ref 135–145)

## 2021-11-17 LAB — CBC
HCT: 35.1 % — ABNORMAL LOW (ref 36.0–46.0)
Hemoglobin: 11.4 g/dL — ABNORMAL LOW (ref 12.0–15.0)
MCH: 29.5 pg (ref 26.0–34.0)
MCHC: 32.5 g/dL (ref 30.0–36.0)
MCV: 90.9 fL (ref 80.0–100.0)
Platelets: 326 10*3/uL (ref 150–400)
RBC: 3.86 MIL/uL — ABNORMAL LOW (ref 3.87–5.11)
RDW: 16.3 % — ABNORMAL HIGH (ref 11.5–15.5)
WBC: 11.9 10*3/uL — ABNORMAL HIGH (ref 4.0–10.5)
nRBC: 0 % (ref 0.0–0.2)

## 2021-11-17 LAB — BRAIN NATRIURETIC PEPTIDE: B Natriuretic Peptide: 765.5 pg/mL — ABNORMAL HIGH (ref 0.0–100.0)

## 2021-11-17 LAB — TROPONIN I (HIGH SENSITIVITY)
Troponin I (High Sensitivity): 23 ng/L — ABNORMAL HIGH (ref <18)
Troponin I (High Sensitivity): 29 ng/L — ABNORMAL HIGH (ref <18)

## 2021-11-17 MED ORDER — MONTELUKAST SODIUM 10 MG PO TABS
10.0000 mg | ORAL_TABLET | Freq: Every day | ORAL | Status: DC
  Administered 2021-11-18 – 2021-11-25 (×8): 10 mg via ORAL
  Filled 2021-11-17 (×8): qty 1

## 2021-11-17 MED ORDER — FUROSEMIDE 10 MG/ML IJ SOLN
40.0000 mg | Freq: Two times a day (BID) | INTRAMUSCULAR | Status: DC
  Administered 2021-11-18 – 2021-11-20 (×5): 40 mg via INTRAVENOUS
  Filled 2021-11-17 (×5): qty 4

## 2021-11-17 MED ORDER — MAGNESIUM OXIDE -MG SUPPLEMENT 400 (240 MG) MG PO TABS
400.0000 mg | ORAL_TABLET | Freq: Every day | ORAL | Status: DC
  Administered 2021-11-18 – 2021-11-25 (×8): 400 mg via ORAL
  Filled 2021-11-17 (×8): qty 1

## 2021-11-17 MED ORDER — SODIUM CHLORIDE 0.9% FLUSH
3.0000 mL | Freq: Two times a day (BID) | INTRAVENOUS | Status: DC
  Administered 2021-11-18 – 2021-11-19 (×5): 3 mL via INTRAVENOUS

## 2021-11-17 MED ORDER — POTASSIUM CHLORIDE CRYS ER 20 MEQ PO TBCR
40.0000 meq | EXTENDED_RELEASE_TABLET | Freq: Once | ORAL | Status: AC
  Administered 2021-11-17: 40 meq via ORAL
  Filled 2021-11-17: qty 2

## 2021-11-17 MED ORDER — ACEBUTOLOL HCL 200 MG PO CAPS
200.0000 mg | ORAL_CAPSULE | Freq: Two times a day (BID) | ORAL | Status: DC
  Administered 2021-11-18 – 2021-11-20 (×7): 200 mg via ORAL
  Filled 2021-11-17 (×8): qty 1

## 2021-11-17 MED ORDER — POTASSIUM CHLORIDE CRYS ER 10 MEQ PO TBCR
10.0000 meq | EXTENDED_RELEASE_TABLET | Freq: Every day | ORAL | Status: DC
  Administered 2021-11-18 – 2021-11-19 (×2): 10 meq via ORAL
  Filled 2021-11-17 (×3): qty 1

## 2021-11-17 MED ORDER — IPRATROPIUM-ALBUTEROL 0.5-2.5 (3) MG/3ML IN SOLN: 3.0000 mL | Freq: Four times a day (QID) | RESPIRATORY_TRACT | Status: DC | PRN

## 2021-11-17 MED ORDER — ONDANSETRON HCL 4 MG/2ML IJ SOLN: 4.0000 mg | Freq: Four times a day (QID) | INTRAMUSCULAR | Status: DC | PRN

## 2021-11-17 MED ORDER — SODIUM CHLORIDE 0.9% FLUSH: 3.0000 mL | INTRAVENOUS | Status: DC | PRN

## 2021-11-17 MED ORDER — ENOXAPARIN SODIUM 40 MG/0.4ML IJ SOSY: 40.0000 mg | PREFILLED_SYRINGE | INTRAMUSCULAR | Status: DC

## 2021-11-17 MED ORDER — FUROSEMIDE 10 MG/ML IJ SOLN
40.0000 mg | Freq: Once | INTRAMUSCULAR | Status: AC
  Administered 2021-11-17: 40 mg via INTRAVENOUS
  Filled 2021-11-17: qty 4

## 2021-11-17 MED ORDER — ATORVASTATIN CALCIUM 10 MG PO TABS
10.0000 mg | ORAL_TABLET | Freq: Every day | ORAL | Status: DC
  Administered 2021-11-17 – 2021-11-24 (×8): 10 mg via ORAL
  Filled 2021-11-17 (×8): qty 1

## 2021-11-17 MED ORDER — SODIUM CHLORIDE 0.9 % IV SOLN: 250.0000 mL | INTRAVENOUS | Status: DC | PRN

## 2021-11-17 MED ORDER — CLONIDINE HCL 0.1 MG PO TABS
0.1000 mg | ORAL_TABLET | Freq: Two times a day (BID) | ORAL | Status: DC
  Administered 2021-11-17 – 2021-11-20 (×7): 0.1 mg via ORAL
  Filled 2021-11-17 (×8): qty 1

## 2021-11-17 MED ORDER — ENOXAPARIN SODIUM 40 MG/0.4ML IJ SOSY
40.0000 mg | PREFILLED_SYRINGE | Freq: Two times a day (BID) | INTRAMUSCULAR | Status: DC
  Administered 2021-11-17 – 2021-11-19 (×4): 40 mg via SUBCUTANEOUS
  Filled 2021-11-17 (×4): qty 0.4

## 2021-11-17 MED ORDER — ACETAMINOPHEN 325 MG PO TABS
650.0000 mg | ORAL_TABLET | ORAL | Status: DC | PRN
  Administered 2021-11-21 – 2021-11-24 (×3): 650 mg via ORAL
  Filled 2021-11-17 (×3): qty 2

## 2021-11-17 MED ORDER — IRBESARTAN 150 MG PO TABS
300.0000 mg | ORAL_TABLET | Freq: Every day | ORAL | Status: DC
  Administered 2021-11-18 – 2021-11-20 (×3): 300 mg via ORAL
  Filled 2021-11-17: qty 2
  Filled 2021-11-17: qty 1
  Filled 2021-11-17: qty 2

## 2021-11-17 MED ORDER — HYDRALAZINE HCL 25 MG PO TABS
37.5000 mg | ORAL_TABLET | Freq: Three times a day (TID) | ORAL | Status: DC
  Administered 2021-11-17 – 2021-11-20 (×10): 37.5 mg via ORAL
  Filled 2021-11-17 (×11): qty 2

## 2021-11-17 MED ORDER — OMEGA-3-ACID ETHYL ESTERS 1 G PO CAPS
1000.0000 mg | ORAL_CAPSULE | Freq: Every day | ORAL | Status: DC
  Administered 2021-11-18 – 2021-11-25 (×8): 1000 mg via ORAL
  Filled 2021-11-17 (×8): qty 1

## 2021-11-17 MED ORDER — ENOXAPARIN SODIUM 40 MG/0.4ML IJ SOSY: 40.0000 mg | PREFILLED_SYRINGE | Freq: Two times a day (BID) | INTRAMUSCULAR | Status: DC

## 2021-11-17 NOTE — ED Provider Notes (Signed)
?Lindsay [name] ?Provider Note ? ? ?CSN: [id number] ?Arrival date & time: 11/17/21  1454 ? ?  ? ?History ? ?Chief Complaint  ?Patient presents with  ? Shortness of Breath  ? ? ?Lindsay [name] is a 76 y.o. female history of hypertension, atrial tachycardia, PVC nonrheumatic heart valve disease for evaluation of fluid overload.  A week ago was admitted at outside hospital for AKI and hypercalcemia.  Received IV fluids.  Since then has had PND, orthopnea, lower extremity swelling.  Was seen by cardiology, Dr. Bettina Gavia today.  He recommended admission for diuresis as he did not feel she get adequate diuresis outpatient as well as due to her shortness of breath without the slightest exertion.  Has been having to sleep upright in a recliner.  He is unsure what her dry weight is.  He has cough productive of white sputum.  No fever, emesis, abdominal pain.  She feels generally weak.  No recent sick exposures. ? ?HPI ? ?  ? ?Home Medications ?Prior to Admission medications   ?Medication Sig Start Date End Date Taking? Authorizing Provider  ?acebutolol (SECTRAL) 200 MG capsule TAKE 1 CAPSULE BY MOUTH TWICE A DAY 07/09/21   Richardo Priest, MD  ?albuterol (VENTOLIN HFA) 108 (90 Base) MCG/ACT inhaler Inhale 2 puffs into the lungs every 4 (four) hours as needed for shortness of breath or wheezing. 09/03/21   [provider]  ?atorvastatin (LIPITOR) 10 MG tablet Take 10 mg by mouth at bedtime. 01/16/16   [provider]  ?[place] Patter 100-25 MCG/ACT AEPB Inhale 1 puff into the lungs daily. 09/10/21   [provider]  ?cloNIDine (CATAPRES) 0.1 MG tablet TAKE 1 TABLET BY MOUTH TWICE A DAY 10/01/21   Richardo Priest, MD  ?furosemide (LASIX) 20 MG tablet Take 20 mg by mouth. Take 40 mg every morning and 20 mg every afternoon    [provider]  ?hydrALAZINE (APRESOLINE) 25 MG tablet Take 1.5 tablets (37.5 mg total) by mouth 3 (three) times daily. 05/08/21 11/17/21  Richardo Priest, MD  ?ipratropium-albuterol (DUONEB) 0.5-2.5 (3) MG/3ML SOLN Take 3 mLs by nebulization 3 (three) times daily. 09/08/21   [provider]  ?Magnesium 250 MG TABS Take 250 mg by mouth daily.    [provider]  ?montelukast (SINGULAIR) 10 MG tablet TAKE 1 (ONE) TABLET DAILY FOR ASTHMA 01/22/19   [provider]  ?olmesartan (BENICAR) 40 MG tablet Take 40 mg by mouth daily. 11/09/21   [provider]  ?Omega-3 1000 MG CAPS Take 2,000 mg by mouth 2 (two) times daily.    [provider]  ?potassium chloride (KLOR-CON) 10 MEQ tablet Take 1 tablet (10 mEq total) by mouth daily. 10/05/21   Richardo Priest, MD  ?POTASSIUM GLUCONATE PO Take 99 mg by mouth daily.    [provider]  ?   ? ?Allergies    ?Patient has no known allergies.   ? ?Review of Systems   ?Review of Systems  ?Constitutional: Negative.   ?HENT: Negative.    ?Respiratory:  Positive for cough and shortness of breath.   ?Cardiovascular:  Positive for leg swelling. Negative for chest pain and palpitations.  ?Gastrointestinal: Negative.   ?Genitourinary: Negative.   ?Musculoskeletal: Negative.   ?Skin: Negative.   ?Neurological:  Positive for weakness (generalized).  ?All other systems reviewed and are negative. ? ?Physical Exam ?Updated Vital Signs ?BP (!) 147/92   Pulse 97   Temp (!) 97.5 ?  F (36.4 ?C) (Oral)   Resp (!) 39   Ht 5\' 7"  (1.702 m)   Wt 128.9 kg   SpO2 94%   BMI 44.51 kg/m?  ?Physical Exam ?Vitals and nursing note reviewed.  ?Constitutional:   ?   General: She is not in acute distress. ?   Appearance: She is well-developed. She is obese. She is not ill-appearing, toxic-appearing or diaphoretic.  ?HENT:  ?   Head: Atraumatic.  ?Eyes:  ?   Pupils: Pupils are equal, round, and reactive to light.  ?Cardiovascular:  ?   Rate and Rhythm: Normal rate.  ?   Pulses: Normal pulses.     ?     Radial pulses are 2+ on the right side and 2+ on the left side.  ?     Dorsalis pedis pulses are 2+ on  the right side and 2+ on the left side.  ?   Heart sounds: Murmur heard.  ?Pulmonary:  ?   Effort: No respiratory distress.  ?   Comments: Crackles bilateral lower lung bases.  Tachypnea at rest ?Chest:  ?   Comments: Nontender ?Abdominal:  ?   General: Bowel sounds are normal. There is no distension.  ?   Palpations: Abdomen is soft.  ?   Comments: Soft, non-tender ?Ecchymosis to lower abdomen from previous Lovenox injections  ?Musculoskeletal:     ?   General: Normal range of motion.  ?   Cervical back: Normal range of motion.  ?   Right lower leg: No tenderness. Edema present.  ?   Left lower leg: No tenderness. Edema present.  ?Skin: ?   General: Skin is warm and dry.  ?   Capillary Refill: Capillary refill takes less than 2 seconds.  ?Neurological:  ?   General: No focal deficit present.  ?   Mental Status: She is alert.  ?Psychiatric:     ?   Mood and Affect: Mood normal.  ? ? ?ED Results / Procedures / Treatments   ?Labs ?(all labs ordered are listed, but only abnormal results are displayed) ?Labs Reviewed  ?BASIC METABOLIC PANEL - Abnormal; Notable for the following components:  ?    Result Value  ? Potassium 3.2 (*)   ? Glucose, Bld 108 (*)   ? Creatinine, Ser 1.57 (*)   ? GFR, Estimated 34 (*)   ? All other components within normal limits  ?CBC - Abnormal; Notable for the following components:  ? WBC 11.9 (*)   ? RBC 3.86 (*)   ? Hemoglobin 11.4 (*)   ? HCT 35.1 (*)   ? RDW 16.3 (*)   ? All other components within normal limits  ?BRAIN NATRIURETIC PEPTIDE - Abnormal; Notable for the following components:  ? B Natriuretic Peptide 765.5 (*)   ? All other components within normal limits  ?TROPONIN I (HIGH SENSITIVITY) - Abnormal; Notable for the following components:  ? Troponin I (High Sensitivity) 23 (*)   ? All other components within normal limits  ?RESP PANEL BY RT-PCR (FLU A&B, COVID) ARPGX2  ?TROPONIN I (HIGH SENSITIVITY)  ? ? ?EKG ?EKG Interpretation ? ?Date/Time:  Tuesday November 17 2021 15:08:45  EDT ?Ventricular Rate:  96 ?PR Interval:    ?QRS Duration: 98 ?QT Interval:  374 ?QTC Calculation: 472 ?R Axis:   23 ?Text Interpretation: PACs PVCs Nonspecific ST and T wave abnormality Prolonged QT Abnormal ECG When compared with ECG of 24-Jun-2017 14:11, PREVIOUS ECG IS PRESENT Confirmed by Isla Pence [phone])  on 11/17/2021 9:47:45 PM ? ?Radiology ?DG Chest 2 View ? ?Result Date: 11/17/2021 ?CLINICAL DATA:  Chest pain in a 76 year old female. EXAM: CHEST - 2 VIEW COMPARISON:  November 07, 2021. FINDINGS: Lungs are hyperinflated. No signs of pleural effusion. No lobar consolidative changes. Lingular opacity likely scarring or atelectasis unchanged. Cardiomediastinal contours are enlarged with central pulmonary vascular congestion without signs of frank edema. On limited assessment there is no acute skeletal finding. IMPRESSION: 1. Cardiomegaly with central pulmonary vascular congestion. No signs of frank edema. 2. Stable lingular opacity likely scarring or atelectasis. Electronically Signed   By: Zetta Bills M.D.   On: 11/17/2021 16:49   ? ?Procedures ?Procedures  ? ? ?Medications Ordered in ED ?Medications  ?furosemide (LASIX) injection 40 mg (has no administration in time range)  ? ? ?ED Course/ Medical Decision Making/ A&P ?  ? ?76 year old here for evaluation of fluid overload.  Afebrile, nonseptic, no appearing.  Per chart review was admitted about a week and a half ago at outside hospital for AKI as well as hypercalcemia.  Received IV fluids.  After discharge he developed PND, orthopnea and lower extremity swelling.  Requiring sleeping upright in a recliner.  She appears grossly fluid overloaded on exam.  Has some tachypnea at rest.  Rales at bases.  Fortunately she is not hypoxic.  Reviewed her note from cardiologist, Dr. Bettina Gavia who recommended admission for IV diuresis as well as cardiac cath.  Patient denies any current pain.  At this time I low suspicion for acute ACS, PE or dissection. ? ?Labs and  imaging personally viewed and interpreted: ? ?CBC leukocytosis 11.9, hemoglobin 11.4 ?Metabolic panel hypokalemia 3.2, creatinine 1.57 ?Troponin 23 suspect due to fluid overload ?BNP 765 ?Chest x-ray with cardiomegaly and

## 2021-11-17 NOTE — ED Provider Notes (Signed)
?  Provider Note ?MRN:  [id number]  ?Arrival date & time: 11/17/21    ?ED Course and Medical Decision Making  ?Assumed care from Dr. Gilford Raid at shift change. ? ?CHF to be admitted to cards. ? ?Procedures ? ?Final Clinical Impressions(s) / ED Diagnoses  ? ?  ICD-10-CM   ?1. Pulmonary vascular congestion  R09.89   ?  ?2. Other hypervolemia  E87.79   ?  ?3. Nonrheumatic aortic valve stenosis  I35.0   ?  ?4. Elevated troponin  R77.8   ?  ?  ?ED Discharge Orders   ? ? None  ? ?  ?  ?Discharge Instructions   ?None ?  ? ? ?Barth Kirks. Sedonia Small, MD ?[facility] Emergency Medicine ?[email or web address] ? ?  ?Maudie Flakes, MD ?11/18/21 [phone] ? ?

## 2021-11-17 NOTE — Progress Notes (Signed)
?Cardiology Office Note:   ? ?Date:  11/17/2021  ? ?ID:  Lindsay [name], DOB 1945/12/01, MRN [id number] ? ?PCP:  Ronita Hipps, MD  ?Cardiologist:  Shirlee More, MD   ? ?Referring MD: Ronita Hipps, MD  ? ? ?ASSESSMENT:   ? ?1. Nonrheumatic aortic valve stenosis   ?2. Essential hypertension   ?3. Mixed hyperlipidemia   ?4. Hypercalcemia   ? ?PLAN:   ? ?In order of problems listed above: ? ?She has markedly deteriorated from last seen by me her heart failure is decompensated and I think she requires hospitalization for [place] to look to see if she get a better [facility] because in that situation she could have combined evaluation by the structural heart in hospital once heart failure is compensated.  I do not think she will tolerate or be effective with outpatient treatment.  The patient and her family now want to pursue intervention for her aortic stenosis.  She had severe hypercalcemia tells me that outpatient calcium was normal.  She was on a thiazide diuretic previously.  Her atrial arrhythmias stable ? ? ?Next appointment: To be determined after inpatient care for heart failure ? ? ?Medication Adjustments/Labs and Tests Ordered: ?Current medicines are reviewed at length with the patient today.  Concerns regarding medicines are outlined above.  ?No orders of the defined types were placed in this encounter. ? ?No orders of the defined types were placed in this encounter. ? ? ?Chief Complaint  ?Patient presents with  ? Follow-up  ?Despite increasing her diuretic to 3 times a day she is increasingly short of breath and edematous  ?History of Present Illness:   ? ?Lindsay [name] is a 76 y.o. female with a hx of severe symptomatic aortic stenosis who has decided not to accept intervention at this time also with heart failure hypertension hyperlipidemia and atrial tachycardia  last seen 11/05/2021. ? ?I received a critical lab alert last Saturday at 536 in the morning for severely elevated calcium patient was notified  sent to [facility] ED and admitted by the hospitalist and treated for hypercalcemia.  Prior to discharge the hospitalist came to me and I told him that the patient should follow-up with her primary care physician for hypercalcemia unrelated to her aortic valve disease. ? ?Compliance with diet, lifestyle and medications: Yes ? ?Her daughter CNA works in Museum/gallery curator Lindsay [name] family practices with her participate in the evaluation decision making ?Despite increasing her diuretic she is increasingly edematous short of breath at rest and presents to my office in decompensated heart failure ?I suspect she had IV saline loading during her recent hospitalization for hypercalcemia contributes to her decompensation but she also has severe symptomatic aortic stenosis ?Family wants to pursue intervention ?She obviously needs to be admitted to the hospital with orthopnea PND diffuse edema shortness of breath at rest and I prefer admission to [facility] if possible and to proceed with right and left heart catheterization once compensated. ?No chest pain syncope or palpitation ?She tells me repeat labs on Friday showed normal calcium ? ?Component Ref Range & Units 12 d ago 1 mo ago 6 mo ago  ?NT-Pro BNP 0 - 738 pg/mL 4,799 High   4,997 High  CM  2,223 High   ? ?Past Medical History:  ?Diagnosis Date  ? 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 Baker's cyst of knee, left 02/12/2020  ? Breast cancer ([place])   ? Cancer [facility])   ? left breast  cancer  ? Chronic pain of left knee 02/12/2020  ? Dyspnea   ? Dysrhythmia   ? PACs and nonsustained runs of atrial tach by Holter 10/13/16  ? Family history of breast cancer   ? Family history of colon cancer   ? Family history of melanoma   ? Family history of pancreatic cancer   ? Genetic testing 09/15/2017  ? The patient had genetic testing due to a personal history of breast cancer and skin cancer,  and a family history of breast cancer, pancreatic cancer, melanoma, and colon  cancer.  The Common Hereditary Cancer Panel + Melanoma Panel + Basal Cell Nevus Syndrome Panel was ordered (58 genes).  The following genes were evaluated for sequence changes and exonic deletions/duplications: APC, ATM, AXIN2, B  ? History of kidney stones   ? History of left breast cancer 12/06/2017  ? Hyperlipidemia 04/11/2019  ? Hypertension   ? Malignant neoplasm of upper-inner quadrant of left breast in female, estrogen receptor negative ([place]) 06/21/2017  ? Palpitation 02/01/2016  ? PAT (paroxysmal atrial tachycardia) ([place]) 10/30/2016  ? Personal history of colonic polyps   ? Personal history of skin cancer   ? Port-A-Cath in place 07/01/2017  ? SVT (supraventricular tachycardia) ([place]) 05/31/2018  ? ? ?Past Surgical History:  ?Procedure Laterality Date  ? COLONOSCOPY    ? FOOT SURGERY Right   ? IR CV LINE INJECTION  08/02/2017  ? left breast lumpectomy  2000  ? MASTECTOMY WITH AXILLARY LYMPH NODE DISSECTION Left 12/06/2017  ? Procedure: LEFT MASTECTOMY WITH TARGETED LYMPH NODE DISSECTION;  Surgeon: Coralie Keens, MD;  Location: [place];  Service: General;  Laterality: Left;  ? PORT-A-CATH REMOVAL Right 01/13/2018  ? Procedure: REMOVAL PORT-A-CATH;  Surgeon: Coralie Keens, MD;  Location: [place];  Service: General;  Laterality: Right;  ? PORTACATH PLACEMENT Right 06/30/2017  ? Procedure: [place];  Surgeon: Coralie Keens, MD;  Location: [place];  Service: General;  Laterality: Right;  ? ? ?Current Medications: ?Current Meds  ?Medication Sig  ? acebutolol (SECTRAL) 200 MG capsule TAKE 1 CAPSULE BY MOUTH TWICE A DAY  ? albuterol (VENTOLIN HFA) 108 (90 Base) MCG/ACT inhaler Inhale 2 puffs into the lungs every 4 (four) hours as needed for shortness of breath or wheezing.  ? atorvastatin (LIPITOR) 10 MG tablet Take 10 mg by mouth at bedtime.  ? BREO ELLIPTA 100-25 MCG/ACT AEPB Inhale 1 puff into the lungs daily.  ? cloNIDine (CATAPRES) 0.1 MG tablet TAKE 1 TABLET BY MOUTH TWICE  A DAY  ? furosemide (LASIX) 20 MG tablet Take 20 mg by mouth. Take 40 mg every morning and 20 mg every afternoon  ? hydrALAZINE (APRESOLINE) 25 MG tablet Take 1.5 tablets (37.5 mg total) by mouth 3 (three) times daily.  ? ipratropium-albuterol (DUONEB) 0.5-2.5 (3) MG/3ML SOLN Take 3 mLs by nebulization 3 (three) times daily.  ? Magnesium 250 MG TABS Take 250 mg by mouth daily.  ? montelukast (SINGULAIR) 10 MG tablet TAKE 1 (ONE) TABLET DAILY FOR ASTHMA  ? olmesartan (BENICAR) 40 MG tablet Take 40 mg by mouth daily.  ? Omega-3 1000 MG CAPS Take 2,000 mg by mouth 2 (two) times daily.  ? potassium chloride (KLOR-CON) 10 MEQ tablet Take 1 tablet (10 mEq total) by mouth daily.  ? POTASSIUM GLUCONATE PO Take 99 mg by mouth daily.  ?  ? ?Allergies:   Patient has no known allergies.  ? ?Social History  ? ?Socioeconomic History  ?  Marital status: Married  ?  Spouse name: Not on file  ? Number of children: Not on file  ? Years of education: Not on file  ? Highest education level: Not on file  ?Occupational History  ? Not on file  ?Tobacco Use  ? Smoking status: Former  ?  Packs/day: 2.00  ?  Years: 50.00  ?  Pack years: 100.00  ?  Types: Cigarettes  ?  Quit date: 06/02/2014  ?  Years since quitting: 7.4  ? Smokeless tobacco: Never  ?Vaping Use  ? Vaping Use: Never used  ?Substance and Sexual Activity  ? Alcohol use: No  ? Drug use: No  ? Sexual activity: Yes  ?Other Topics Concern  ? Not on file  ?Social History Narrative  ? Not on file  ? ?Social Determinants of Health  ? ?Financial Resource Strain: Not on file  ?Food Insecurity: Not on file  ?Transportation Needs: Not on file  ?Physical Activity: Not on file  ?Stress: Not on file  ?Social Connections: Not on file  ?  ? ?Family History: ?The patient's family history includes Basal cell carcinoma in her sister; Breast cancer in her paternal aunt; Breast cancer (age of onset: 60) in her sister; Breast cancer (age of onset: 53) in her sister; Colon cancer (age of onset: 76) in  her sister; Melanoma in her sister; Pancreatic cancer (age of onset: 95) in her mother. ?ROS:   ?Please see the history of present illness.    ?All other systems reviewed and are negative. ? ?EKGs/La

## 2021-11-17 NOTE — Patient Instructions (Signed)
Medication Instructions:  ?Your physician recommends that you continue on your current medications as directed. Please refer to the Current Medication list given to you today. ? ?*If you need a refill on your cardiac medications before your next appointment, please call your pharmacy* ? ? ?Lab Work: ?NONE ?If you have labs (blood work) drawn today and your tests are completely normal, you will receive your results only by: ?MyChart Message (if you have MyChart) OR ?A paper copy in the mail ?If you have any lab test that is abnormal or we need to change your treatment, we will call you to review the results. ? ? ?Testing/Procedures: ?NONE ? ? ?Follow-Up: ?At [facility], you and your health needs are our priority.  As part of our continuing mission to provide you with exceptional heart care, we have created designated Provider Care Teams.  These Care Teams include your primary Cardiologist (physician) and Advanced Practice Providers (APPs -  Physician Assistants and Nurse Practitioners) who all work together to provide you with the care you need, when you need it. ? ?We recommend signing up for the patient portal called "MyChart".  Sign up information is provided on this After Visit Summary.  MyChart is used to connect with patients for Virtual Visits (Telemedicine).  Patients are able to view lab/test results, encounter notes, upcoming appointments, etc.  Non-urgent messages can be sent to your provider as well.   ?To learn more about what you can do with MyChart, go to [email or web address].   ? ?Your next appointment:  Pending stay at [facility]. Pt needs to go today, Dr. Bettina Gavia spoke with pt and set it up for her to go over to the ED and they will get her in. ?  ? ?The format for your next appointment:   ? ? ?Provider:   ? ?  ? ? ?Other Instructions ?  ?

## 2021-11-17 NOTE — ED Triage Notes (Addendum)
Pt arrived via POV. Pt sent by cardiologist for fluid overload. Pt states having SOBx2days. Pt states she was recently in the hospital in [place] and they gave her saline through her IV. Pt is eupneic. Pt has 3+ edema of RLE, 2+ edema of LLE. ?

## 2021-11-17 NOTE — ED Provider Triage Note (Signed)
Emergency Medicine Provider Triage Evaluation Note ? ?Lindsay [name] , a 76 y.o. female  was evaluated in triage.  Pt complains of shortness of breath. States that she was admitted to [facility] 2 weeks ago where she was given significant amount of fluids for hypercalcemia and kidney problems.  States that following discharge she noted that her legs were swelling and she became progressively more short of breath.  She was seen in her cardiologist today who sent her here with suspicion that she would need admission for IV diuresis.  She is on Lasix at home and has been compliant with her regimen. ? ?Review of Systems  ?Positive: Shortness of breath ?Negative: Fevers, chills, chest pain ? ?Physical Exam  ?BP (!) 167/104   Pulse 94   Temp (!) 97.5 ?F (36.4 ?C) (Oral)   Resp 16   Ht 5\' 7"  (1.702 m)   Wt 129.7 kg   SpO2 95%   BMI 44.78 kg/m?  ?Gen:   Awake, no distress   ?Resp:  Normal effort  ?MSK:   Moves extremities without difficulty  ?Other:   ? ?Medical Decision Making  ?Medically screening exam initiated at 3:42 PM.  Appropriate orders placed.  Lindsay [name] was informed that the remainder of the evaluation will be completed by another provider, this initial triage assessment does not replace that evaluation, and the importance of remaining in the ED until their evaluation is complete. ? ? ?  ?Bud Face, PA-C ?11/17/21 1544 ? ?

## 2021-11-17 NOTE — Telephone Encounter (Signed)
Spoke with patient regarding hospital follow up appointment scheduled for today that she was to have followed up with her primary care after hospital discharge not with Cardiology. Patient states that she did follow up with her pcp. However patient request todays appointment due to being short of breath and does not want to wait till her scheduled appointment in June. Will make Dr Bettina Gavia aware. ?

## 2021-11-17 NOTE — H&P (Signed)
?Cardiology Admission History and Physical:  ? ?Patient ID: Lindsay [name] ?MRN: [id number]; DOB: 1946-03-23  ? ?Admission date: 11/17/2021 ? ?PCP:  Ronita Hipps, MD ?  ?[facility] Providers ?Cardiologist:  Shirlee More, MD   ? ?Chief Complaint: Requested by Dr. Bettina Gavia ? ?Patient Profile:  ? ?Lindsay [name] is a 76 y.o. female with a past medical history of symptomatic severe aortic stenosis, essential hypertension, hyperlipidemia, atrial tachycardia, hypercalcemia and CKD who is being seen 11/17/2021 for the evaluation of volume overload. ? ?History of Present Illness:  ? ?[name] was admitted at OSH approximately a week ago for diuresis treatment.  Patient was seen by Dr. Bettina Gavia today and recommended hospitalization given decompensated heart failure and symptomatic severe aortic stenosis.  Patient is seen in the ED with her husband at bedside.  Patient reports worsening lower extremity edema, worsening shortness of breath even at restand PND.  She says she has to sit up straight in the past 2 weeks at night due to breathing issue.  Denies fever, chills, dizziness, syncope, cough,  chest discomfort, heart palpitations, nausea, vomiting, early satiety, worsening abdominal fullness, dysuria, diarrhea, or any bleeding events. ? ?Patient and her family now agree with aortic valve interventional treatment.  ? ?On admission ?K3.2, creatinine 1.47 (was 2.46 11/05/21), BNP 765, troponin 23-> 29, WBC 11.9, hemoglobin 11.4 ? ? ?Past Medical History:  ?Diagnosis Date  ? 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 Baker's cyst of knee, left 02/12/2020  ? Breast cancer ([place])   ? Cancer [facility])   ? left breast cancer  ? Chronic pain of left knee 02/12/2020  ? Dyspnea   ? Dysrhythmia   ? PACs and nonsustained runs of atrial tach by Holter 10/13/16  ? Family history of breast cancer   ? Family history of colon cancer   ? Family history of melanoma   ? Family history of pancreatic cancer   ? Genetic testing 09/15/2017  ? The  patient had genetic testing due to a personal history of breast cancer and skin cancer,  and a family history of breast cancer, pancreatic cancer, melanoma, and colon cancer.  The Common Hereditary Cancer Panel + Melanoma Panel + Basal Cell Nevus Syndrome Panel was ordered (58 genes).  The following genes were evaluated for sequence changes and exonic deletions/duplications: APC, ATM, AXIN2, B  ? History of kidney stones   ? History of left breast cancer 12/06/2017  ? Hyperlipidemia 04/11/2019  ? Hypertension   ? Malignant neoplasm of upper-inner quadrant of left breast in female, estrogen receptor negative ([place]) 06/21/2017  ? Palpitation 02/01/2016  ? PAT (paroxysmal atrial tachycardia) ([place]) 10/30/2016  ? Personal history of colonic polyps   ? Personal history of skin cancer   ? Port-A-Cath in place 07/01/2017  ? SVT (supraventricular tachycardia) ([place]) 05/31/2018  ? ? ?Past Surgical History:  ?Procedure Laterality Date  ? COLONOSCOPY    ? FOOT SURGERY Right   ? IR CV LINE INJECTION  08/02/2017  ? left breast lumpectomy  2000  ? MASTECTOMY WITH AXILLARY LYMPH NODE DISSECTION Left 12/06/2017  ? Procedure: LEFT MASTECTOMY WITH TARGETED LYMPH NODE DISSECTION;  Surgeon: Coralie Keens, MD;  Location: [place];  Service: General;  Laterality: Left;  ? PORT-A-CATH REMOVAL Right 01/13/2018  ? Procedure: REMOVAL PORT-A-CATH;  Surgeon: Coralie Keens, MD;  Location: [place];  Service: General;  Laterality: Right;  ? PORTACATH PLACEMENT Right 06/30/2017  ? Procedure: INSERTION PORT-A-CATH ERAS PATHWAY;  Surgeon: Coralie Keens,  MD;  Location: [place];  Service: General;  Laterality: Right;  ?  ? ?Medications Prior to Admission: ?Prior to Admission medications   ?Medication Sig Start Date End Date Taking? Authorizing Provider  ?acebutolol (SECTRAL) 200 MG capsule TAKE 1 CAPSULE BY MOUTH TWICE A DAY ?Patient taking differently: Take 200 mg by mouth 2 (two) times daily. 07/09/21  Yes Richardo Priest, MD  ?acetaminophen  (TYLENOL) 500 MG tablet Take 1,000 mg by mouth every 6 (six) hours as needed for moderate pain or headache.   Yes [provider]  ?albuterol (VENTOLIN HFA) 108 (90 Base) MCG/ACT inhaler Inhale 2 puffs into the lungs every 4 (four) hours as needed for shortness of breath or wheezing. 09/03/21  Yes [provider]  ?atorvastatin (LIPITOR) 10 MG tablet Take 10 mg by mouth at bedtime. 01/16/16  Yes [provider]  ?[place] Patter 100-25 MCG/ACT AEPB Inhale 1 puff into the lungs daily. 09/10/21  Yes [provider]  ?cloNIDine (CATAPRES) 0.1 MG tablet TAKE 1 TABLET BY MOUTH TWICE A DAY ?Patient taking differently: Take 0.1 mg by mouth 2 (two) times daily. 10/01/21  Yes Richardo Priest, MD  ?furosemide (LASIX) 20 MG tablet Take 20-40 mg by mouth See admin instructions. 40 mg in the morning ?20 mg in the afternoon   Yes [provider]  ?hydrALAZINE (APRESOLINE) 25 MG tablet Take 1.5 tablets (37.5 mg total) by mouth 3 (three) times daily. 05/08/21 11/17/21 Yes Richardo Priest, MD  ?ipratropium-albuterol (DUONEB) 0.5-2.5 (3) MG/3ML SOLN Take 3 mLs by nebulization every 6 (six) hours as needed (shortness of breath). 09/08/21  Yes [provider]  ?Magnesium 250 MG TABS Take 250 mg by mouth daily.   Yes [provider]  ?montelukast (SINGULAIR) 10 MG tablet Take 10 mg by mouth daily. 01/22/19  Yes [provider]  ?olmesartan (BENICAR) 40 MG tablet Take 40 mg by mouth daily. 11/09/21  Yes [provider]  ?Omega-3 1000 MG CAPS Take 1,000 mg by mouth 2 (two) times daily.   Yes [provider]  ?potassium chloride (KLOR-CON) 10 MEQ tablet Take 1 tablet (10 mEq total) by mouth daily. 10/05/21  Yes Richardo Priest, MD  ?  ? ?Allergies:   No Known Allergies ? ?Social History:   ?Social History  ? ?Socioeconomic History  ? Marital status: Married  ?  Spouse name: Not on file  ? Number of children: Not on file  ? Years of education: Not on file  ? Highest  education level: Not on file  ?Occupational History  ? Not on file  ?Tobacco Use  ? Smoking status: Former  ?  Packs/day: 2.00  ?  Years: 50.00  ?  Pack years: 100.00  ?  Types: Cigarettes  ?  Quit date: 06/02/2014  ?  Years since quitting: 7.4  ? Smokeless tobacco: Never  ?Vaping Use  ? Vaping Use: Never used  ?Substance and Sexual Activity  ? Alcohol use: No  ? Drug use: No  ? Sexual activity: Yes  ?Other Topics Concern  ? Not on file  ?Social History Narrative  ? Not on file  ? ?Social Determinants of Health  ? ?Financial Resource Strain: Not on file  ?Food Insecurity: Not on file  ?Transportation Needs: Not on file  ?Physical Activity: Not on file  ?Stress: Not on file  ?Social Connections: Not on file  ?Intimate Partner Violence: Not on file  ?  ?Family History:   ?The patient's family history  includes Basal cell carcinoma in her sister; Breast cancer in her paternal aunt; Breast cancer (age of onset: 85) in her sister; Breast cancer (age of onset: 14) in her sister; Colon cancer (age of onset: 10) in her sister; Melanoma in her sister; Pancreatic cancer (age of onset: 45) in her mother.   ? ?ROS:  ?Please see the history of present illness.  ?All other ROS reviewed and negative.    ? ?Physical Exam/Data:  ? ?Vitals:  ? 11/17/21 2155 11/17/21 2200 11/17/21 2215 11/17/21 2245  ?BP: (!) 147/92 (!) 152/78 (!) 141/69 (!) 151/93  ?Pulse: 97 96 85 91  ?Resp: (!) 39 (!) 24 19 (!) 23  ?Temp:      ?TempSrc:      ?SpO2: 94% 95% 94% 93%  ?Weight:      ?Height:      ? ?No intake or output data in the 24 hours ending 11/17/21 2316 ?Last 3 Weights 11/17/2021 11/17/2021 11/17/2021  ?Weight (lbs) 284 lb 2.8 oz 285 lb 15 oz 286 lb  ?Weight (kg) 128.9 kg 129.7 kg 129.729 kg  ?   ?Body mass index is 44.51 kg/m?.  ?General:  Well nourished, well developed, in no acute distress ?HEENT: normal ?Neck: JVD below earlobe at 90 degrees ?Vascular: No carotid bruits; Distal pulses 2+ bilaterally   ?Cardiac:  normal S1, S2; RRR; III/VI murmur  holosystolic encompasses S2 radiates to her carotids ?Lungs:  rales ?Abd: soft, nontender, no hepatomegaly  ?Ext: 3+ pitting edema up to shins ?Musculoskeletal:  No deformities, BUE and BLE strength norma

## 2021-11-18 ENCOUNTER — Other Ambulatory Visit: Payer: Self-pay

## 2021-11-18 ENCOUNTER — Encounter (HOSPITAL_COMMUNITY): Payer: Self-pay | Admitting: Internal Medicine

## 2021-11-18 ENCOUNTER — Inpatient Hospital Stay (HOSPITAL_COMMUNITY): Payer: Medicare Other

## 2021-11-18 DIAGNOSIS — I350 Nonrheumatic aortic (valve) stenosis: Secondary | ICD-10-CM

## 2021-11-18 DIAGNOSIS — I5033 Acute on chronic diastolic (congestive) heart failure: Secondary | ICD-10-CM

## 2021-11-18 LAB — ECHOCARDIOGRAM COMPLETE
AR max vel: 0.81 cm2
AV Area VTI: 0.69 cm2
AV Area mean vel: 0.75 cm2
AV Mean grad: 29 mmHg
AV Peak grad: 50.7 mmHg
Ao pk vel: 3.56 m/s
Calc EF: 53.5 %
Height: 67 in
S' Lateral: 3.8 cm
Single Plane A2C EF: 49.9 %
Single Plane A4C EF: 54.5 %
Weight: 4546.77 oz

## 2021-11-18 LAB — BASIC METABOLIC PANEL
Anion gap: 11 (ref 5–15)
BUN: 19 mg/dL (ref 8–23)
CO2: 24 mmol/L (ref 22–32)
Calcium: 8.6 mg/dL — ABNORMAL LOW (ref 8.9–10.3)
Chloride: 105 mmol/L (ref 98–111)
Creatinine, Ser: 1.61 mg/dL — ABNORMAL HIGH (ref 0.44–1.00)
GFR, Estimated: 33 mL/min — ABNORMAL LOW (ref >60)
Glucose, Bld: 89 mg/dL (ref 70–99)
Potassium: 3.2 mmol/L — ABNORMAL LOW (ref 3.5–5.1)
Sodium: 140 mmol/L (ref 135–145)

## 2021-11-18 NOTE — Progress Notes (Signed)
?  Echocardiogram ?2D Echocardiogram has been performed. ? ?Lindsay [name] ?11/18/2021, 12:15 PM ?

## 2021-11-18 NOTE — Plan of Care (Signed)

## 2021-11-18 NOTE — Progress Notes (Signed)
? ?Progress Note ? ?Patient Name: Lindsay [name] ?Date of Encounter: 11/18/2021 ? ?[facility] Cardiologist: Bettina Gavia  ? ?Subjective  ? ?76 year old female with a history of aortic stenosis.  She was seen by Dr. Bettina Gavia yesterday was transferred up to [facility] for further evaluation and management. ? ?History of severe symptomatic aortic stenosis, hypertension, hyperlipidemia, atrial tachycardia, hypercalcemia and CKD.  She was admitted with volume overload. ? ?Documentation of her I's and O's reveal a net diuresis of 500 cc so far. ?He is getting Lasix 40 mg IV twice daily. ?She is breathing much better  ? ?Inpatient Medications  ?  ?Scheduled Meds: ? acebutolol  200 mg Oral BID  ? atorvastatin  10 mg Oral QHS  ? cloNIDine  0.1 mg Oral BID  ? enoxaparin (LOVENOX) injection  40 mg Subcutaneous Q12H  ? furosemide  40 mg Intravenous BID  ? hydrALAZINE  37.5 mg Oral TID  ? irbesartan  300 mg Oral Daily  ? magnesium oxide  400 mg Oral Daily  ? montelukast  10 mg Oral Daily  ? omega-3 acid ethyl esters  1,000 mg Oral Daily  ? potassium chloride  10 mEq Oral Daily  ? sodium chloride flush  3 mL Intravenous Q12H  ? ?Continuous Infusions: ? sodium chloride    ? ?PRN Meds: ?sodium chloride, acetaminophen, ipratropium-albuterol, ondansetron (ZOFRAN) IV, sodium chloride flush  ? ?Vital Signs  ?  ?Vitals:  ? 11/18/21 0515 11/18/21 0615 11/18/21 0730 11/18/21 0745  ?BP: (!) 148/77 (!) 151/96 (!) 160/87 (!) 164/83  ?Pulse: 85 86 89 94  ?Resp: 18 15 17 16   ?Temp:      ?TempSrc:      ?SpO2: 95% 93% 96% 97%  ?Weight:      ?Height:      ? ? ?Intake/Output Summary (Last 24 hours) at 11/18/2021 0946 ?Last data filed at 11/18/2021 0000 ?Gross per 24 hour  ?Intake --  ?Output 500 ml  ?Net -500 ml  ? ? ?  11/17/2021  ?  3:51 PM 11/17/2021  ?  3:17 PM 11/17/2021  ? 12:39 PM  ?Last 3 Weights  ?Weight (lbs) 284 lb 2.8 oz 285 lb 15 oz 286 lb  ?Weight (kg) 128.9 kg 129.7 kg 129.729 kg  ?   ? ?Telemetry  ?  ?Aifib - Personally Reviewed ? ?ECG  ?  ?  Afib with no ST / T abn  - Personally Reviewed ? ?Physical Exam  ? ?GEN: No acute distress.   ?Neck: No JVD ?Cardiac: irreg. Irreg.  2/6 systolic murmur radiating to URSB , s/p L mastectomy  ?Respiratory: Clear to auscultation bilaterally. ?GI: Soft, nontender, non-distended  ?MS: No edema; No deformity. ?Neuro:  Nonfocal  ?Psych: Normal affect  ? ?Labs  ?  ?High Sensitivity Troponin:   ?Recent Labs  ?Lab 11/17/21 ?1556 11/17/21 ?2225  ?TROPONINIHS 23* 29*  ?   ?Chemistry ?Recent Labs  ?Lab 11/17/21 ?1556 11/18/21 ?0500  ?NA 139 140  ?K 3.2* 3.2*  ?CL 103 105  ?CO2 22 24  ?GLUCOSE 108* 89  ?BUN 21 19  ?CREATININE 1.57* 1.61*  ?CALCIUM 8.9 8.6*  ?GFRNONAA 34* 33*  ?ANIONGAP 14 11  ?  ?Lipids No results for input(s): CHOL, TRIG, HDL, LABVLDL, LDLCALC, CHOLHDL in the last 168 hours.  ?Hematology ?Recent Labs  ?Lab 11/17/21 ?1556  ?WBC 11.9*  ?RBC 3.86*  ?HGB 11.4*  ?HCT 35.1*  ?MCV 90.9  ?MCH 29.5  ?MCHC 32.5  ?RDW 16.3*  ?PLT 326  ? ?  Thyroid No results for input(s): TSH, FREET4 in the last 168 hours.  ?BNP ?Recent Labs  ?Lab 11/17/21 ?1556  ?BNP 765.5*  ?  ?DDimer No results for input(s): DDIMER in the last 168 hours.  ? ?Radiology  ?  ?DG Chest 2 View ? ?Result Date: 11/17/2021 ?CLINICAL DATA:  Chest pain in a 76 year old female. EXAM: CHEST - 2 VIEW COMPARISON:  November 07, 2021. FINDINGS: Lungs are hyperinflated. No signs of pleural effusion. No lobar consolidative changes. Lingular opacity likely scarring or atelectasis unchanged. Cardiomediastinal contours are enlarged with central pulmonary vascular congestion without signs of frank edema. On limited assessment there is no acute skeletal finding. IMPRESSION: 1. Cardiomegaly with central pulmonary vascular congestion. No signs of frank edema. 2. Stable lingular opacity likely scarring or atelectasis. Electronically Signed   By: Zetta Bills M.D.   On: 11/17/2021 16:49   ? ?Cardiac Studies  ? ?  ? ?Patient Profile  ?   ?76 y.o. female   ? ?Assessment & Plan  ?  ?1.   Aortic stenosis: The patient has history of known aortic stenosis.  She developed volume overload and was transferred from [place] up to [facility] for further evaluation and management. ? ?She is diuresed 500 cc and already feels quite a bit better. ?Echocardiogram from September, 2022 reveals normal left ventricular systolic function with EF of 55 to 60%.  She has grade 2 diastolic dysfunction. ?The mean aortic valve gradient was 25 mmHg. ?Moderate pulmonary hypertension with an estimated PA pressure of 47 mmHg. ? ?She is scheduled for repeat echocardiogram.  We will anticipate doing heart catheterization and TEE once she is better diuresed. ? ?Acute on chronic diastolic congestive heart failure: He was volume overloaded and having shortness of breath.  She is diuresed and is already feeling better.  Continue diuresis for now.  Anticipate heart catheterization and TEE for further evaluation of her aortic stenosis once she improves. ? ? ? ?   ? ?For questions or updates, please contact [place] ?Please consult [email or web address] for contact info under  ? ?  ?   ?Signed, ?Mertie Moores, MD  ?11/18/2021, 9:46 AM   ? ?

## 2021-11-18 NOTE — ED Notes (Signed)
Echo at bedside

## 2021-11-18 NOTE — Progress Notes (Signed)
Received patient from ED via stretcher. Patient is alert and oriented x 4, accompanied by her husband. Patient is able to stand up and walk to the bed with minimal assist.  On 3L [place], short of breath on exertion, denies pain.  Assisted in bed in position of comfort.  Oriented to room and unit routine.  Call bell within reach, needs addressed. ?

## 2021-11-19 DIAGNOSIS — I5043 Acute on chronic combined systolic (congestive) and diastolic (congestive) heart failure: Secondary | ICD-10-CM

## 2021-11-19 LAB — CBC
HCT: 35.4 % — ABNORMAL LOW (ref 36.0–46.0)
Hemoglobin: 11.3 g/dL — ABNORMAL LOW (ref 12.0–15.0)
MCH: 29.2 pg (ref 26.0–34.0)
MCHC: 31.9 g/dL (ref 30.0–36.0)
MCV: 91.5 fL (ref 80.0–100.0)
Platelets: 298 10*3/uL (ref 150–400)
RBC: 3.87 MIL/uL (ref 3.87–5.11)
RDW: 16.3 % — ABNORMAL HIGH (ref 11.5–15.5)
WBC: 10.2 10*3/uL (ref 4.0–10.5)
nRBC: 0 % (ref 0.0–0.2)

## 2021-11-19 LAB — BASIC METABOLIC PANEL
Anion gap: 8 (ref 5–15)
Anion gap: 11 (ref 5–15)
BUN: 23 mg/dL (ref 8–23)
BUN: 25 mg/dL — ABNORMAL HIGH (ref 8–23)
CO2: 27 mmol/L (ref 22–32)
CO2: 25 mmol/L (ref 22–32)
Calcium: 8.5 mg/dL — ABNORMAL LOW (ref 8.9–10.3)
Calcium: 8.8 mg/dL — ABNORMAL LOW (ref 8.9–10.3)
Chloride: 104 mmol/L (ref 98–111)
Chloride: 102 mmol/L (ref 98–111)
Creatinine, Ser: 1.83 mg/dL — ABNORMAL HIGH (ref 0.44–1.00)
Creatinine, Ser: 1.96 mg/dL — ABNORMAL HIGH (ref 0.44–1.00)
GFR, Estimated: 28 mL/min — ABNORMAL LOW (ref >60)
GFR, Estimated: 26 mL/min — ABNORMAL LOW (ref >60)
Glucose, Bld: 83 mg/dL (ref 70–99)
Glucose, Bld: 96 mg/dL (ref 70–99)
Potassium: 3.2 mmol/L — ABNORMAL LOW (ref 3.5–5.1)
Potassium: 3.8 mmol/L (ref 3.5–5.1)
Sodium: 139 mmol/L (ref 135–145)
Sodium: 138 mmol/L (ref 135–145)

## 2021-11-19 MED ORDER — ENOXAPARIN SODIUM 40 MG/0.4ML IJ SOSY
40.0000 mg | PREFILLED_SYRINGE | INTRAMUSCULAR | Status: DC
  Filled 2021-11-19: qty 0.4

## 2021-11-19 MED ORDER — POTASSIUM CHLORIDE CRYS ER 20 MEQ PO TBCR
20.0000 meq | EXTENDED_RELEASE_TABLET | Freq: Two times a day (BID) | ORAL | Status: DC
  Administered 2021-11-19 – 2021-11-20 (×2): 20 meq via ORAL
  Filled 2021-11-19 (×2): qty 1

## 2021-11-19 MED ORDER — SODIUM CHLORIDE 0.9% FLUSH
3.0000 mL | Freq: Two times a day (BID) | INTRAVENOUS | Status: DC
  Administered 2021-11-19 – 2021-11-20 (×3): 3 mL via INTRAVENOUS

## 2021-11-19 MED ORDER — SODIUM CHLORIDE 0.9 % IV SOLN: 250.0000 mL | INTRAVENOUS | Status: DC | PRN

## 2021-11-19 MED ORDER — SODIUM CHLORIDE 0.9% FLUSH: 3.0000 mL | INTRAVENOUS | Status: DC | PRN

## 2021-11-19 MED ORDER — POTASSIUM CHLORIDE CRYS ER 20 MEQ PO TBCR
40.0000 meq | EXTENDED_RELEASE_TABLET | Freq: Once | ORAL | Status: AC
  Administered 2021-11-19: 40 meq via ORAL
  Filled 2021-11-19: qty 2

## 2021-11-19 MED ORDER — SODIUM CHLORIDE 0.9 % IV SOLN: INTRAVENOUS | Status: DC

## 2021-11-19 NOTE — Plan of Care (Signed)

## 2021-11-19 NOTE — Progress Notes (Signed)
? ?Progress Note ? ?Patient Name: Lindsay [name] ?Date of Encounter: 11/19/2021 ? ?[facility] Cardiologist: Bettina Gavia  ? ?Subjective  ? ?76 year old female with a history of aortic stenosis.  She was seen by Dr. Bettina Gavia yesterday was transferred up to [facility] for further evaluation and management. ? ?History of severe symptomatic aortic stenosis, hypertension, hyperlipidemia, atrial tachycardia, hypercalcemia and CKD.  She was admitted with volume overload. ? ?I/O are only -650 for the admission  ?Wt is 125 kg ? ?Inpatient Medications  ?  ?Scheduled Meds: ? acebutolol  200 mg Oral BID  ? atorvastatin  10 mg Oral QHS  ? cloNIDine  0.1 mg Oral BID  ? enoxaparin (LOVENOX) injection  40 mg Subcutaneous Q12H  ? furosemide  40 mg Intravenous BID  ? hydrALAZINE  37.5 mg Oral TID  ? irbesartan  300 mg Oral Daily  ? magnesium oxide  400 mg Oral Daily  ? montelukast  10 mg Oral Daily  ? omega-3 acid ethyl esters  1,000 mg Oral Daily  ? potassium chloride  10 mEq Oral Daily  ? sodium chloride flush  3 mL Intravenous Q12H  ? ?Continuous Infusions: ? sodium chloride    ? ?PRN Meds: ?sodium chloride, acetaminophen, ipratropium-albuterol, ondansetron (ZOFRAN) IV, sodium chloride flush  ? ?Vital Signs  ?  ?Vitals:  ? 11/18/21 1250 11/18/21 2012 11/18/21 2130 11/19/21 0536  ?BP:  132/62 (!) 147/75 (!) 142/73  ?Pulse:  71 89 88  ?Resp:  18  19  ?Temp:  98.6 ?F (37 ?C)  98.1 ?F (36.7 ?C)  ?TempSrc:  Oral  Oral  ?SpO2:  95% 98% 98%  ?Weight: 127.5 kg   125.2 kg  ?Height: 5\' 7"  (1.702 m)     ? ? ?Intake/Output Summary (Last 24 hours) at 11/19/2021 1033 ?Last data filed at 11/19/2021 1540 ?Gross per 24 hour  ?Intake 450 ml  ?Output 600 ml  ?Net -150 ml  ? ? ? ?  11/19/2021  ?  5:36 AM 11/18/2021  ? 12:50 PM 11/17/2021  ?  3:51 PM  ?Last 3 Weights  ?Weight (lbs) 276 lb 1.6 oz 281 lb 1.4 oz 284 lb 2.8 oz  ?Weight (kg) 125.238 kg 127.5 kg 128.9 kg  ?   ? ?Telemetry  ?  ?  Personally Reviewed ? ?ECG  ?  ?  - Personally Reviewed ? ?Physical Exam   ? ?Physical Exam: ?Blood pressure (!) 142/73, pulse 88, temperature 98.1 ?F (36.7 ?C), temperature source Oral, resp. rate 19, height 5\' 7"  (1.702 m), weight 125.2 kg, SpO2 98 %. ? ?GEN:  elderly , moderately obese female  ?HEENT: Normal ?NECK: No JVD; No carotid bruits ?LYMPHATICS: No lymphadenopathy ?CARDIAC: irreg irreg.  2/6 systolic murmur  ?RESPIRATORY:  Clear to auscultation without rales, wheezing or rhonchi  ?ABDOMEN: Soft, non-tender, non-distended ?MUSCULOSKELETAL:  2+ leg edema  ?SKIN: Warm and dry ?NEUROLOGIC:  Alert and oriented x 3 ? ? ?Labs  ?  ?High Sensitivity Troponin:   ?Recent Labs  ?Lab 11/17/21 ?1556 11/17/21 ?2225  ?TROPONINIHS 23* 29*  ? ?   ?Chemistry ?Recent Labs  ?Lab 11/17/21 ?1556 11/18/21 ?0500 11/19/21 ?0254  ?NA 139 140 139  ?K 3.2* 3.2* 3.2*  ?CL 103 105 104  ?CO2 22 24 27   ?GLUCOSE 108* 89 83  ?BUN 21 19 23   ?CREATININE 1.57* 1.61* 1.83*  ?CALCIUM 8.9 8.6* 8.5*  ?GFRNONAA 34* 33* 28*  ?ANIONGAP 14 11 8   ? ?  ?Lipids No results for input(s): CHOL,  TRIG, HDL, LABVLDL, LDLCALC, CHOLHDL in the last 168 hours.  ?Hematology ?Recent Labs  ?Lab 11/17/21 ?1556  ?WBC 11.9*  ?RBC 3.86*  ?HGB 11.4*  ?HCT 35.1*  ?MCV 90.9  ?MCH 29.5  ?MCHC 32.5  ?RDW 16.3*  ?PLT 326  ? ? ?Thyroid No results for input(s): TSH, FREET4 in the last 168 hours.  ?BNP ?Recent Labs  ?Lab 11/17/21 ?1556  ?BNP 765.5*  ? ?  ?DDimer No results for input(s): DDIMER in the last 168 hours.  ? ?Radiology  ?  ?DG Chest 2 View ? ?Result Date: 11/17/2021 ?CLINICAL DATA:  Chest pain in a 76 year old female. EXAM: CHEST - 2 VIEW COMPARISON:  November 07, 2021. FINDINGS: Lungs are hyperinflated. No signs of pleural effusion. No lobar consolidative changes. Lingular opacity likely scarring or atelectasis unchanged. Cardiomediastinal contours are enlarged with central pulmonary vascular congestion without signs of frank edema. On limited assessment there is no acute skeletal finding. IMPRESSION: 1. Cardiomegaly with central pulmonary  vascular congestion. No signs of frank edema. 2. Stable lingular opacity likely scarring or atelectasis. Electronically Signed   By: Zetta Bills M.D.   On: 11/17/2021 16:49  ? ?ECHOCARDIOGRAM COMPLETE ? ?Result Date: 11/18/2021 ?   ECHOCARDIOGRAM REPORT   Patient Name:   Lindsay [name] Date of Exam: 11/18/2021 Medical Rec #:  [id number]    Height:       67.0 in Accession #:    [id number]   Weight:       284.2 lb Date of Birth:  05/26/1946     BSA:          2.348 m? Patient Age:    24 years     BP:           164/83 mmHg Patient Gender: F            HR:           91 bpm. Exam Location:  Inpatient Procedure: 2D Echo, Color Doppler and Cardiac Doppler Indications:    Aortic stenosis I35.0  History:        Patient has prior history of Echocardiogram examinations, most                 recent 05/22/2021. Signs/Symptoms:Dyspnea; Risk                 Factors:Hypertension and Dyslipidemia.  Sonographer:    Bernadene Person RDCS Referring Phys: [id number] FAN YE IMPRESSIONS  1. Moderate to severe aortic stenosis is present. Vmax 3.6 m/s, MG 29 mmHG, AVA 0.69cm2, DI 0.20. Findings may represent paradoxical low flow low gradient aortic stenosis due to low SV index (22 cc/m2). Would recommend an aortic valve calcium score or TEE for clarification. The aortic valve is tricuspid. There is severe calcifcation of the aortic valve. There is severe thickening of the aortic valve. Aortic valve regurgitation is not visualized. Moderate to severe aortic valve stenosis.  2. Left ventricular ejection fraction, by estimation, is 55 to 60%. The left ventricle has normal function. The left ventricle has no regional wall motion abnormalities. There is mild concentric left ventricular hypertrophy. Left ventricular diastolic function could not be evaluated.  3. Right ventricular systolic function is normal. The right ventricular size is normal. There is normal pulmonary artery systolic pressure.  4. Left atrial size was mildly dilated.  5. Right atrial size  was mildly dilated.  6. The mitral valve is degenerative. Mild to moderate mitral valve regurgitation.  7. The inferior vena cava is normal  in size with greater than 50% respiratory variability, suggesting right atrial pressure of 3 mmHg. Comparison(s): No significant change from prior study. FINDINGS  Left Ventricle: Left ventricular ejection fraction, by estimation, is 55 to 60%. The left ventricle has normal function. The left ventricle has no regional wall motion abnormalities. The left ventricular internal cavity size was normal in size. There is  mild concentric left ventricular hypertrophy. Left ventricular diastolic function could not be evaluated due to atrial fibrillation. Left ventricular diastolic function could not be evaluated. Right Ventricle: The right ventricular size is normal. No increase in right ventricular wall thickness. Right ventricular systolic function is normal. There is normal pulmonary artery systolic pressure. The tricuspid regurgitant velocity is 2.82 m/s, and  with an assumed right atrial pressure of 3 mmHg, the estimated right ventricular systolic pressure is 38.9 mmHg. Left Atrium: Left atrial size was mildly dilated. Right Atrium: Right atrial size was mildly dilated. Pericardium: Trivial pericardial effusion is present. Mitral Valve: The mitral valve is degenerative in appearance. Mild to moderate mitral annular calcification. Mild to moderate mitral valve regurgitation. Tricuspid Valve: The tricuspid valve is grossly normal. Tricuspid valve regurgitation is mild . No evidence of tricuspid stenosis. Aortic Valve: Moderate to severe aortic stenosis is present. Vmax 3.6 m/s, MG 29 mmHG, AVA 0.69cm2, DI 0.20. Findings may represent paradoxical low flow low gradient aortic stenosis due to low SV index (22 cc/m2). Would recommend an aortic valve calcium score or TEE for clarification. The aortic valve is tricuspid. There is severe calcifcation of the aortic valve. There is severe  thickening of the aortic valve. Aortic valve regurgitation is not visualized. Moderate to severe aortic stenosis is present. Aortic valve mean gradient measures 29.0 mmHg. Aortic valve peak gradient measure

## 2021-11-19 NOTE — Consult Note (Addendum)
?[facility]   ?MULTIDISCIPLINARY HEART VALVE TEAM ? ?Cardiology Consultation:  ? ?Patient ID: Lindsay [name] ?MRN: [id number]; DOB: May 08, 1946 ? ?Admit date: 11/17/2021 ?Date of Consult: 11/19/2021 ? ?Primary Care Provider: Ronita Hipps, MD ?[facility] Cardiologist: Dr. Bettina Gavia, MD  ? ?Patient Profile:  ? ?Lindsay [name] is a 76 y.o. female with a hx of essential hypertension, hyperlipidemia, atrial fibrillation on Eliquis, hypercalcemia, breast cancer s/p left breast mastectomy (chemo only/no radiation), CKD stage IIIb, and severe symptomatic aortic stenosis who is being seen today for the evaluation of possible TAVR at the request of Lindsay [name]. ? ? ?History of Present Illness:  ? ?Lindsay [name] lives in [place], [place] with her husband of many years. They have two adult children who also live close by in the area. She has been rather functional with ADL/IADLs and housework until several months ago. She was previously able to due her home activities without SOB or taking frequent breaks. More recently she will have to sit and rest with minimal activity. She has been unable to lay flat to sleep and has intermittent leg swelling. She ambulates without assistance in her home and for short distances however may use a cane for longer distances while out running errands. She has full upper and lower dentures therefore does not follow regularly with a dentist.  ? ?Lindsay [name] has been followed by Dr. Bettina [name] for her cardiology care. She had an echocardiogram 05/2017 that showed a normal LVEF at 60-65% with no aortic stenosis noted at that time. He was following her mainly for palpitations/PAT and hypertension. By 04/2021, she was seen in follow up at which time a murmur was heard on exam prompting repeat echocardiogram that showed severe low flow aortic stenosis with an LVEF at 55-60%, G2DD, mean gradient at 25.21mHg, AVA by VTI at 0.72cm2, SVI 20. She was asymptomatic at that time. She was monitored closely in the OP  setting and began to have symptoms around February of this year. TAVR was mentioned however she was rather reluctant to proceed. Her diuretics were adjusted and plan was to see her back in close follow up. On evaluation, 11/05/21 she continued to have HF symptoms and continued to defer valve workup.  ? ?Unfortunately on most recent evaluation with Dr. MBettina Gavia3/21/23 she was noted to have markedly deteriorated with worsening HF symptoms. Plan was for hospital admission and further workup for aortic stenosis.  ? ?Today she is comfortable without chest pain, palpitations, SOB, or dizziness. She has marked improvement in LE edema and orthopnea symptoms with IV diuretics. She has no s/s of infection with fever, chills, cough, nausea, or vomiting. Patient and her family are now agreeable with pursuing aortic valve treatment.  ? ?Past Medical History:  ?Diagnosis Date  ? 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 Baker's cyst of knee, left 02/12/2020  ? [facility]   ? Cancer ([facility]   ? left breast cancer  ? Chronic pain of left knee 02/12/2020  ? Dyspnea   ? Dysrhythmia   ? PACs and nonsustained runs of atrial tach by Holter 10/13/16  ? Family history of breast cancer   ? Family history of colon cancer   ? Family history of melanoma   ? Family history of pancreatic cancer   ? Genetic testing 09/15/2017  ? The patient had genetic testing due to a personal history of breast cancer and skin cancer,  and a family history of breast cancer, pancreatic cancer, melanoma, and  colon cancer.  The Common Hereditary Cancer Panel + Melanoma Panel + Basal Cell Nevus Syndrome Panel was ordered (58 genes).  The following genes were evaluated for sequence changes and exonic deletions/duplications: APC, ATM, AXIN2, B  ? History of kidney stones   ? History of left breast cancer 12/06/2017  ? Hyperlipidemia 04/11/2019  ? Hypertension   ? Malignant neoplasm of upper-inner quadrant of left breast in female, estrogen receptor negative  ([place]) 06/21/2017  ? Palpitation 02/01/2016  ? PAT (paroxysmal atrial tachycardia) ([place]) 10/30/2016  ? Personal history of colonic polyps   ? Personal history of skin cancer   ? Port-A-Cath in place 07/01/2017  ? SVT (supraventricular tachycardia) ([place]) 05/31/2018  ? ? ?Past Surgical History:  ?Procedure Laterality Date  ? COLONOSCOPY    ? FOOT SURGERY Right   ? IR CV LINE INJECTION  08/02/2017  ? left breast lumpectomy  2000  ? MASTECTOMY WITH AXILLARY LYMPH NODE DISSECTION Left 12/06/2017  ? Procedure: LEFT MASTECTOMY WITH TARGETED LYMPH NODE DISSECTION;  Surgeon: Lindsay Keens, MD;  Location: [place];  Service: General;  Laterality: Left;  ? PORT-A-CATH REMOVAL Right 01/13/2018  ? Procedure: REMOVAL PORT-A-CATH;  Surgeon: Lindsay Keens, MD;  Location: [place];  Service: General;  Laterality: Right;  ? PORTACATH PLACEMENT Right 06/30/2017  ? Procedure: [place];  Surgeon: Lindsay Keens, MD;  Location: [place];  Service: General;  Laterality: Right;  ?  ? ?Home Medications:  ?Prior to Admission medications   ?Medication Sig Start Date End Date Taking? Authorizing Provider  ?acebutolol (SECTRAL) 200 MG capsule TAKE 1 CAPSULE BY MOUTH TWICE A DAY ?Patient taking differently: Take 200 mg by mouth 2 (two) times daily. 07/09/21  Yes Lindsay Priest, MD  ?acetaminophen (TYLENOL) 500 MG tablet Take 1,000 mg by mouth every 6 (six) hours as needed for moderate pain or headache.   Yes [provider]  ?albuterol (VENTOLIN HFA) 108 (90 Base) MCG/ACT inhaler Inhale 2 puffs into the lungs every 4 (four) hours as needed for shortness of breath or wheezing. 09/03/21  Yes [provider]  ?atorvastatin (LIPITOR) 10 MG tablet Take 10 mg by mouth at bedtime. 01/16/16  Yes [provider]  ?[place] Patter 100-25 MCG/ACT AEPB Inhale 1 puff into the lungs daily. 09/10/21  Yes [provider]  ?cloNIDine (CATAPRES) 0.1 MG tablet TAKE 1 TABLET BY MOUTH TWICE A  DAY ?Patient taking differently: Take 0.1 mg by mouth 2 (two) times daily. 10/01/21  Yes Lindsay Priest, MD  ?furosemide (LASIX) 20 MG tablet Take 20-40 mg by mouth See admin instructions. 40 mg in the morning ?20 mg in the afternoon   Yes [provider]  ?hydrALAZINE (APRESOLINE) 25 MG tablet Take 1.5 tablets (37.5 mg total) by mouth 3 (three) times daily. 05/08/21 11/17/21 Yes Lindsay Priest, MD  ?ipratropium-albuterol (DUONEB) 0.5-2.5 (3) MG/3ML SOLN Take 3 mLs by nebulization every 6 (six) hours as needed (shortness of breath). 09/08/21  Yes [provider]  ?Magnesium 250 MG TABS Take 250 mg by mouth daily.   Yes [provider]  ?montelukast (SINGULAIR) 10 MG tablet Take 10 mg by mouth daily. 01/22/19  Yes [provider]  ?olmesartan (BENICAR) 40 MG tablet Take 40 mg by mouth daily. 11/09/21  Yes [provider]  ?Omega-3 1000 MG CAPS Take 1,000 mg by mouth 2 (two) times daily.   Yes [provider]  ?potassium chloride (KLOR-CON) 10 MEQ tablet Take 1 tablet (10  mEq total) by mouth daily. 10/05/21  Yes Lindsay Priest, MD  ? ? ?Inpatient Medications: ?Scheduled Meds: ? acebutolol  200 mg Oral BID  ? atorvastatin  10 mg Oral QHS  ? cloNIDine  0.1 mg Oral BID  ? enoxaparin (LOVENOX) injection  40 mg Subcutaneous Q12H  ? furosemide  40 mg Intravenous BID  ? hydrALAZINE  37.5 mg Oral TID  ? irbesartan  300 mg Oral Daily  ? magnesium oxide  400 mg Oral Daily  ? montelukast  10 mg Oral Daily  ? omega-3 acid ethyl esters  1,000 mg Oral Daily  ? potassium chloride  10 mEq Oral Daily  ? potassium chloride  20 mEq Oral BID  ? potassium chloride  40 mEq Oral Once  ? sodium chloride flush  3 mL Intravenous Q12H  ? ?Continuous Infusions: ? sodium chloride    ? ?PRN Meds: ?sodium chloride, acetaminophen, ipratropium-albuterol, ondansetron (ZOFRAN) IV, sodium chloride flush ? ?Allergies:   No Known Allergies ? ?Social History:   ?Social History  ? ?Socioeconomic History  ?  Marital status: Married  ?  Spouse name: Not on file  ? Number of children: Not on file  ? Years of education: Not on file  ? Highest education level: Not on file  ?Occupational History  ? Not on file  ?Tobacco

## 2021-11-20 ENCOUNTER — Encounter: Payer: Self-pay | Admitting: Hematology

## 2021-11-20 ENCOUNTER — Other Ambulatory Visit (HOSPITAL_COMMUNITY): Payer: Self-pay

## 2021-11-20 ENCOUNTER — Encounter (HOSPITAL_COMMUNITY)
Admission: EM | Disposition: A | Discharge status: Home / Self Care | Payer: Self-pay | Source: Ambulatory Visit | Attending: Internal Medicine

## 2021-11-20 DIAGNOSIS — I4819 Other persistent atrial fibrillation: Secondary | ICD-10-CM

## 2021-11-20 DIAGNOSIS — R778 Other specified abnormalities of plasma proteins: Secondary | ICD-10-CM

## 2021-11-20 LAB — BASIC METABOLIC PANEL
Anion gap: 10 (ref 5–15)
Anion gap: 9 (ref 5–15)
BUN: 27 mg/dL — ABNORMAL HIGH (ref 8–23)
BUN: 24 mg/dL — ABNORMAL HIGH (ref 8–23)
CO2: 24 mmol/L (ref 22–32)
CO2: 25 mmol/L (ref 22–32)
Calcium: 8.6 mg/dL — ABNORMAL LOW (ref 8.9–10.3)
Calcium: 8.7 mg/dL — ABNORMAL LOW (ref 8.9–10.3)
Chloride: 105 mmol/L (ref 98–111)
Chloride: 104 mmol/L (ref 98–111)
Creatinine, Ser: 1.96 mg/dL — ABNORMAL HIGH (ref 0.44–1.00)
Creatinine, Ser: 1.93 mg/dL — ABNORMAL HIGH (ref 0.44–1.00)
GFR, Estimated: 26 mL/min — ABNORMAL LOW (ref >60)
GFR, Estimated: 26 mL/min — ABNORMAL LOW (ref >60)
Glucose, Bld: 83 mg/dL (ref 70–99)
Glucose, Bld: 99 mg/dL (ref 70–99)
Potassium: 3.6 mmol/L (ref 3.5–5.1)
Potassium: 4 mmol/L (ref 3.5–5.1)
Sodium: 139 mmol/L (ref 135–145)
Sodium: 138 mmol/L (ref 135–145)

## 2021-11-20 SURGERY — ECHOCARDIOGRAM, TRANSESOPHAGEAL
Anesthesia: Monitor Anesthesia Care

## 2021-11-20 MED ORDER — HEPARIN (PORCINE) 25000 UT/250ML-% IV SOLN
1350.0000 [IU]/h | INTRAVENOUS | Status: DC
  Administered 2021-11-20 – 2021-11-21 (×2): 1200 [IU]/h via INTRAVENOUS
  Administered 2021-11-22 – 2021-11-23 (×2): 1350 [IU]/h via INTRAVENOUS
  Filled 2021-11-20 (×4): qty 250

## 2021-11-20 MED ORDER — SODIUM CHLORIDE 0.9 % IV SOLN: INTRAVENOUS | Status: DC

## 2021-11-20 MED ORDER — HEPARIN BOLUS VIA INFUSION
4000.0000 [IU] | Freq: Once | INTRAVENOUS | Status: AC
  Administered 2021-11-20: 4000 [IU] via INTRAVENOUS
  Filled 2021-11-20: qty 4000

## 2021-11-20 NOTE — Progress Notes (Signed)
ANTICOAGULATION CONSULT NOTE - Initial Consult ? ?Pharmacy Consult for heparin ?Indication: atrial fibrillation ? ?No Known Allergies ? ?Patient Measurements: ?Height: _0  (170.2 cm) ?Weight: 124.9 kg (275 lb 6.4 oz) ?IBW/kg (Calculated) : 61.6 ?Heparin Dosing Weight:92kg ? ?Vital Signs: ?Temp: 98.2 ?F (36.8 ?C) (03/24 6837) ?Temp Source: Oral (03/24 2902) ?BP: 141/69 (03/24 1115) ?Pulse Rate: 84 (03/24 0620) ? ?Labs: ?Recent Labs  ?  11/17/21 ?1556 11/17/21 ?2225 11/18/21 ?0500 11/19/21 ?2136 11/20/21 ?5208 11/20/21 ?1330  ?HGB 11.4*  --   --  11.3*  --   --   ?HCT 35.1*  --   --  35.4*  --   --   ?PLT 326  --   --  298  --   --   ?CREATININE 1.57*  --    < > 1.96* 1.96* 1.93*  ?TROPONINIHS 23* 29*  --   --   --   --   ? < > = values in this interval not displayed.  ? ? ?Estimated Creatinine Clearance: 33 mL/min (A) (by C-G formula based on SCr of 1.93 mg/dL (H)). ? ? ?Medical History: ?Past Medical History:  ?Diagnosis Date  ? 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 Baker's cyst of knee, left 02/12/2020  ? Breast cancer ([place])   ? Cancer [facility])   ? left breast cancer  ? Chronic pain of left knee 02/12/2020  ? Dyspnea   ? Dysrhythmia   ? PACs and nonsustained runs of atrial tach by Holter 10/13/16  ? Family history of breast cancer   ? Family history of colon cancer   ? Family history of melanoma   ? Family history of pancreatic cancer   ? Genetic testing 09/15/2017  ? The patient had genetic testing due to a personal history of breast cancer and skin cancer,  and a family history of breast cancer, pancreatic cancer, melanoma, and colon cancer.  The Common Hereditary Cancer Panel + Melanoma Panel + Basal Cell Nevus Syndrome Panel was ordered (58 genes).  The following genes were evaluated for sequence changes and exonic deletions/duplications: APC, ATM, AXIN2, B  ? History of kidney stones   ? History of left breast cancer 12/06/2017  ? Hyperlipidemia 04/11/2019  ? Hypertension   ? Malignant neoplasm of  upper-inner quadrant of left breast in female, estrogen receptor negative ([place]) 06/21/2017  ? Palpitation 02/01/2016  ? PAT (paroxysmal atrial tachycardia) ([place]) 10/30/2016  ? Personal history of colonic polyps   ? Personal history of skin cancer   ? Port-A-Cath in place 07/01/2017  ? SVT (supraventricular tachycardia) ([place]) 05/31/2018  ? ?Assessment: ?76 year old female with history of afib. Notes state she was prescribed apixaban but she couldn't afford it and was not taking it on admit. Planning cath but patient with aki so this is being postponed until next week. Will start heparin.  ? ?Goal of Therapy:  ?Heparin level 0.3-0.7 units/ml ?Monitor platelets by anticoagulation protocol: Yes ?  ?Plan:  ?Give 4000 units bolus x 1 ?Start heparin infusion at 1200 units/hr ?Check anti-Xa level in 8 hours and daily while on heparin ?Continue to monitor H&H and platelets ? ?Erin Hearing PharmD., BCPS ?Clinical Pharmacist ?11/20/2021 2:48 PM ? ?

## 2021-11-20 NOTE — Progress Notes (Addendum)
? ?Progress Note ? ?Patient Name: Lindsay [name] ?Date of Encounter: 11/20/2021 ? ?[place] Cardiologist: Shirlee More, MD  ? ?Subjective  ?No chest pain.  ?Was able to lie flat last night to sleep  no SOB ? ? ?Inpatient Medications  ?  ?Scheduled Meds: ? acebutolol  200 mg Oral BID  ? atorvastatin  10 mg Oral QHS  ? cloNIDine  0.1 mg Oral BID  ? enoxaparin (LOVENOX) injection  40 mg Subcutaneous Q24H  ? furosemide  40 mg Intravenous BID  ? hydrALAZINE  37.5 mg Oral TID  ? irbesartan  300 mg Oral Daily  ? magnesium oxide  400 mg Oral Daily  ? montelukast  10 mg Oral Daily  ? omega-3 acid ethyl esters  1,000 mg Oral Daily  ? potassium chloride  10 mEq Oral Daily  ? potassium chloride  20 mEq Oral BID  ? sodium chloride flush  3 mL Intravenous Q12H  ? sodium chloride flush  3 mL Intravenous Q12H  ? ?Continuous Infusions: ? sodium chloride    ? sodium chloride    ? sodium chloride    ? ?PRN Meds: ?sodium chloride, sodium chloride, acetaminophen, ipratropium-albuterol, ondansetron (ZOFRAN) IV, sodium chloride flush, sodium chloride flush  ? ?Vital Signs  ?  ?Vitals:  ? 11/19/21 1457 11/19/21 1701 11/19/21 1953 11/20/21 8937  ?BP: (!) 116/56 (!) 147/74 140/73 (!) 141/69  ?Pulse: 89  77 84  ?Resp: 20     ?Temp: 97.9 ?F (36.6 ?C)  97.9 ?F (36.6 ?C) 98.2 ?F (36.8 ?C)  ?TempSrc: Oral  Oral Oral  ?SpO2: 95%  97% 96%  ?Weight:    124.9 kg  ?Height:      ? ? ?Intake/Output Summary (Last 24 hours) at 11/20/2021 0850 ?Last data filed at 11/19/2021 1955 ?Gross per 24 hour  ?Intake 420 ml  ?Output 300 ml  ?Net 120 ml  ? ? ?  11/20/2021  ?  6:20 AM 11/19/2021  ?  5:36 AM 11/18/2021  ? 12:50 PM  ?Last 3 Weights  ?Weight (lbs) 275 lb 6.4 oz 276 lb 1.6 oz 281 lb 1.4 oz  ?Weight (kg) 124.921 kg 125.238 kg 127.5 kg  ?   ? ?Telemetry  ?  ?Possible a fib.  - Personally Reviewed ? ?ECG  ?  ?No new - Personally Reviewed ? ?Physical Exam  ? ?GEN: No acute distress.   ?Neck: No JVD ?Cardiac: irreg, + murmurs, norubs, or gallops.  ?Respiratory:  Clear to auscultation bilaterally. ?GI: Soft, nontender, non-distended  ?MS: No edema; No deformity. ?Neuro:  Nonfocal  ?Psych: Normal affect  ? ?Labs  ?  ?High Sensitivity Troponin:   ?Recent Labs  ?Lab 11/17/21 ?1556 11/17/21 ?2225  ?TROPONINIHS 23* 29*  ?   ?Chemistry ?Recent Labs  ?Lab 11/19/21 ?0254 11/19/21 ?2136 11/20/21 ?3428  ?NA 139 138 139  ?K 3.2* 3.8 3.6  ?CL 104 102 105  ?CO2 27 25 24   ?GLUCOSE 83 96 83  ?BUN 23 25* 27*  ?CREATININE 1.83* 1.96* 1.96*  ?CALCIUM 8.5* 8.8* 8.6*  ?GFRNONAA 28* 26* 26*  ?ANIONGAP 8 11 10   ?  ?Lipids No results for input(s): CHOL, TRIG, HDL, LABVLDL, LDLCALC, CHOLHDL in the last 168 hours.  ?Hematology ?Recent Labs  ?Lab 11/17/21 ?1556 11/19/21 ?2136  ?WBC 11.9* 10.2  ?RBC 3.86* 3.87  ?HGB 11.4* 11.3*  ?HCT 35.1* 35.4*  ?MCV 90.9 91.5  ?MCH 29.5 29.2  ?MCHC 32.5 31.9  ?RDW 16.3* 16.3*  ?PLT 326 298  ? ?  Thyroid No results for input(s): TSH, FREET4 in the last 168 hours.  ?BNP ?Recent Labs  ?Lab 11/17/21 ?1556  ?BNP 765.5*  ?  ?DDimer No results for input(s): DDIMER in the last 168 hours.  ? ?Radiology  ?  ?ECHOCARDIOGRAM COMPLETE ? ?Result Date: 11/18/2021 ?   ECHOCARDIOGRAM REPORT   Patient Name:   Lindsay [name] Date of Exam: 11/18/2021 Medical Rec #:  [id number]    Height:       67.0 in Accession #:    [id number]   Weight:       284.2 lb Date of Birth:  21-Feb-1946     BSA:          2.348 m? Patient Age:    76 years     BP:           164/83 mmHg Patient Gender: F            HR:           91 bpm. Exam Location:  Inpatient Procedure: 2D Echo, Color Doppler and Cardiac Doppler Indications:    Aortic stenosis I35.0  History:        Patient has prior history of Echocardiogram examinations, most                 recent 05/22/2021. Signs/Symptoms:Dyspnea; Risk                 Factors:Hypertension and Dyslipidemia.  Sonographer:    Bernadene Person RDCS Referring Phys: [id number] FAN YE IMPRESSIONS  1. Moderate to severe aortic stenosis is present. Vmax 3.6 m/s, MG 29 mmHG, AVA 0.69cm2, DI 0.20.  Findings may represent paradoxical low flow low gradient aortic stenosis due to low SV index (22 cc/m2). Would recommend an aortic valve calcium score or TEE for clarification. The aortic valve is tricuspid. There is severe calcifcation of the aortic valve. There is severe thickening of the aortic valve. Aortic valve regurgitation is not visualized. Moderate to severe aortic valve stenosis.  2. Left ventricular ejection fraction, by estimation, is 55 to 60%. The left ventricle has normal function. The left ventricle has no regional wall motion abnormalities. There is mild concentric left ventricular hypertrophy. Left ventricular diastolic function could not be evaluated.  3. Right ventricular systolic function is normal. The right ventricular size is normal. There is normal pulmonary artery systolic pressure.  4. Left atrial size was mildly dilated.  5. Right atrial size was mildly dilated.  6. The mitral valve is degenerative. Mild to moderate mitral valve regurgitation.  7. The inferior vena cava is normal in size with greater than 50% respiratory variability, suggesting right atrial pressure of 3 mmHg. Comparison(s): No significant change from prior study. FINDINGS  Left Ventricle: Left ventricular ejection fraction, by estimation, is 55 to 60%. The left ventricle has normal function. The left ventricle has no regional wall motion abnormalities. The left ventricular internal cavity size was normal in size. There is  mild concentric left ventricular hypertrophy. Left ventricular diastolic function could not be evaluated due to atrial fibrillation. Left ventricular diastolic function could not be evaluated. Right Ventricle: The right ventricular size is normal. No increase in right ventricular wall thickness. Right ventricular systolic function is normal. There is normal pulmonary artery systolic pressure. The tricuspid regurgitant velocity is 2.82 m/s, and  with an assumed right atrial pressure of 3 mmHg, the  estimated right ventricular systolic pressure is 32.2 mmHg. Left Atrium: Left atrial size was mildly dilated. Right Atrium: Right atrial  size was mildly dilated. Pericardium: Trivial pericardial effusion is present. Mitral Valve: The mitral valve is degenerative in appearance. Mild to moderate mitral annular calcification. Mild to moderate mitral valve regurgitation. Tricuspid Valve: The tricuspid valve is grossly normal. Tricuspid valve regurgitation is mild . No evidence of tricuspid stenosis. Aortic Valve: Moderate to severe aortic stenosis is present. Vmax 3.6 m/s, MG 29 mmHG, AVA 0.69cm2, DI 0.20. Findings may represent paradoxical low flow low gradient aortic stenosis due to low SV index (22 cc/m2). Would recommend an aortic valve calcium score or TEE for clarification. The aortic valve is tricuspid. There is severe calcifcation of the aortic valve. There is severe thickening of the aortic valve. Aortic valve regurgitation is not visualized. Moderate to severe aortic stenosis is present. Aortic valve mean gradient measures 29.0 mmHg. Aortic valve peak gradient measures 50.7 mmHg. Aortic valve area, by VTI measures 0.69 cm?. Pulmonic Valve: The pulmonic valve was grossly normal. Pulmonic valve regurgitation is mild. No evidence of pulmonic stenosis. Aorta: The aortic root and ascending aorta are structurally normal, with no evidence of dilitation. Venous: The inferior vena cava is normal in size with greater than 50% respiratory variability, suggesting right atrial pressure of 3 mmHg. IAS/Shunts: The atrial septum is grossly normal.  LEFT VENTRICLE PLAX 2D LVIDd:         4.90 cm LVIDs:         3.80 cm LV PW:         1.30 cm LV IVS:        1.30 cm LVOT diam:     2.10 cm LV SV:         51 LV SV Index:   22 LVOT Area:     3.46 cm?  LV Volumes (MOD) LV vol d, MOD A2C: 84.5 ml LV vol d, MOD A4C: 91.9 ml LV vol s, MOD A2C: 42.3 ml LV vol s, MOD A4C: 41.8 ml LV SV MOD A2C:     42.2 ml LV SV MOD A4C:     91.9 ml LV SV  MOD BP:      49.2 ml RIGHT VENTRICLE RV S prime:     7.63 cm/s TAPSE (M-mode): 1.7 cm LEFT ATRIUM             Index        RIGHT ATRIUM           Index LA diam:        5.00 cm 2.13 cm/m?   RA Area:     22

## 2021-11-20 NOTE — Progress Notes (Signed)
? ?  Cr after fluids down from 1.96 to 1.93.  will cancel cath for today.  Will hold lasix for now and re-eval daily.  Plan for cath on Monday but consider holding avapro as well.   ?Will place on IV heparin for atrial fib with plans post cath to change to eliquis.  Pt is now agreeable.   ? ?I decreased fluids as well.  If stable would resume Sunday evening.   ? ?She is on cath board for Monday with Dr. Burt Knack at 1300.  Cath orders will need to be placed when closer to time.   ?

## 2021-11-20 NOTE — Progress Notes (Signed)
Mobility Specialist Progress Note  ? ? 11/20/21 1613  ?Mobility  ?Activity Ambulated with assistance in hallway  ?Level of Assistance Standby assist, set-up cues, supervision of patient - no hands on  ?Assistive Device  ?(IV pole)  ?Distance Ambulated (ft) 70 ft  ?Activity Response Tolerated fair  ?$Mobility charge 1 Mobility  ? ?Pre-Mobility: 85 HR, 105/45 BP ?During Mobility: 100 HR ?Post-Mobility: 88 HR, 114/58 BP ? ?Pt received sitting EOB and agreeable. C/o tiredness upon return. Left sitting EOB with family present.  ? ?Hildred Alamin ?Mobility Specialist  ?  ?

## 2021-11-20 NOTE — TOC Benefit Eligibility Note (Signed)
Patient Advocate Encounter ? ?Insurance verification completed.   ? ?The patient is currently admitted and upon discharge could be taking Eliquis 5 mg. ? ?The current 30 day co-pay is, $47.00.  ? ?The patient is currently admitted and upon discharge could be taking Xarelto 20 mg. ? ?The current 30 day co-pay is, $47.00.  ? ?The patient is insured through [employer] Part D  ? ? ? ?Lyndel Safe, CPhT ?Pharmacy Patient Advocate Specialist ?[place] Patient Advocate Team ?Direct Number: [phone]  Fax: [phone] ? ? ? ? ? ?  ?

## 2021-11-20 NOTE — Care Management Important Message (Signed)
Important Message ? ?Patient Details  ?Name: Lindsay [name] ?MRN: [id number] ?Date of Birth: [birth year] ? ? ?Medicare Important Message Given:  Yes ? ? ? ? ?[name] ?11/20/2021, 2:47 PM ?

## 2021-11-21 ENCOUNTER — Inpatient Hospital Stay (HOSPITAL_COMMUNITY): Payer: Medicare Other

## 2021-11-21 DIAGNOSIS — I10 Essential (primary) hypertension: Secondary | ICD-10-CM

## 2021-11-21 DIAGNOSIS — N179 Acute kidney failure, unspecified: Secondary | ICD-10-CM

## 2021-11-21 DIAGNOSIS — I480 Paroxysmal atrial fibrillation: Secondary | ICD-10-CM

## 2021-11-21 LAB — CBC
HCT: 31.6 % — ABNORMAL LOW (ref 36.0–46.0)
Hemoglobin: 10.3 g/dL — ABNORMAL LOW (ref 12.0–15.0)
MCH: 29.9 pg (ref 26.0–34.0)
MCHC: 32.6 g/dL (ref 30.0–36.0)
MCV: 91.9 fL (ref 80.0–100.0)
Platelets: 268 10*3/uL (ref 150–400)
RBC: 3.44 MIL/uL — ABNORMAL LOW (ref 3.87–5.11)
RDW: 16.3 % — ABNORMAL HIGH (ref 11.5–15.5)
WBC: 9.1 10*3/uL (ref 4.0–10.5)
nRBC: 0 % (ref 0.0–0.2)

## 2021-11-21 LAB — HEPARIN LEVEL (UNFRACTIONATED)
Heparin Unfractionated: 0.29 IU/mL — ABNORMAL LOW (ref 0.30–0.70)
Heparin Unfractionated: 0.33 IU/mL (ref 0.30–0.70)
Heparin Unfractionated: 0.53 IU/mL (ref 0.30–0.70)

## 2021-11-21 LAB — BASIC METABOLIC PANEL
Anion gap: 8 (ref 5–15)
BUN: 31 mg/dL — ABNORMAL HIGH (ref 8–23)
CO2: 25 mmol/L (ref 22–32)
Calcium: 8.7 mg/dL — ABNORMAL LOW (ref 8.9–10.3)
Chloride: 103 mmol/L (ref 98–111)
Creatinine, Ser: 1.98 mg/dL — ABNORMAL HIGH (ref 0.44–1.00)
GFR, Estimated: 25 mL/min — ABNORMAL LOW (ref >60)
Glucose, Bld: 90 mg/dL (ref 70–99)
Potassium: 3.8 mmol/L (ref 3.5–5.1)
Sodium: 136 mmol/L (ref 135–145)

## 2021-11-21 MED ORDER — DOCUSATE SODIUM 100 MG PO CAPS
100.0000 mg | ORAL_CAPSULE | Freq: Two times a day (BID) | ORAL | Status: DC
  Administered 2021-11-21 – 2021-11-24 (×5): 100 mg via ORAL
  Filled 2021-11-21 (×8): qty 1

## 2021-11-21 MED ORDER — GUAIFENESIN-DM 100-10 MG/5ML PO SYRP
15.0000 mL | ORAL_SOLUTION | ORAL | Status: DC | PRN
  Administered 2021-11-21: 15 mL via ORAL
  Filled 2021-11-21: qty 15

## 2021-11-21 MED ORDER — POLYETHYLENE GLYCOL 3350 17 G PO PACK: 17.0000 g | PACK | Freq: Every day | ORAL | Status: DC | PRN

## 2021-11-21 MED ORDER — METOPROLOL TARTRATE 25 MG PO TABS
25.0000 mg | ORAL_TABLET | Freq: Two times a day (BID) | ORAL | Status: DC
  Administered 2021-11-21 – 2021-11-25 (×9): 25 mg via ORAL
  Filled 2021-11-21 (×9): qty 1

## 2021-11-21 NOTE — Progress Notes (Signed)
ANTICOAGULATION CONSULT NOTE  ? ?Pharmacy Consult for heparin ?Indication: atrial fibrillation ? ?No Known Allergies ? ?Patient Measurements: ?Height: _0  (170.2 cm) ?Weight: 124.9 kg (275 lb 6.4 oz) ?IBW/kg (Calculated) : 61.6 ?Heparin Dosing Weight:92kg ? ?Vital Signs: ?Temp: 98.1 ?F (36.7 ?C) (03/24 2044) ?Temp Source: Oral (03/24 2044) ?BP: 138/60 (03/24 2150) ?Pulse Rate: 79 (03/24 2044) ? ?Labs: ?Recent Labs  ?  11/19/21 ?2136 11/20/21 ?5075 11/20/21 ?1330 11/21/21 ?0001  ?HGB 11.3*  --   --  10.3*  ?HCT 35.4*  --   --  31.6*  ?PLT 298  --   --  268  ?HEPARINUNFRC  --   --   --  0.33  ?CREATININE 1.96* 1.96* 1.93* 1.98*  ? ? ? ?Estimated Creatinine Clearance: 32.1 mL/min (A) (by C-G formula based on SCr of 1.98 mg/dL (H)). ? ? ?Medical History: ?Past Medical History:  ?Diagnosis Date  ? 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 Baker's cyst of knee, left 02/12/2020  ? Breast cancer ([place])   ? Cancer [place])   ? left breast cancer  ? Chronic pain of left knee 02/12/2020  ? Dyspnea   ? Dysrhythmia   ? PACs and nonsustained runs of atrial tach by Holter 10/13/16  ? Family history of breast cancer   ? Family history of colon cancer   ? Family history of melanoma   ? Family history of pancreatic cancer   ? Genetic testing 09/15/2017  ? The patient had genetic testing due to a personal history of breast cancer and skin cancer,  and a family history of breast cancer, pancreatic cancer, melanoma, and colon cancer.  The Common Hereditary Cancer Panel + Melanoma Panel + Basal Cell Nevus Syndrome Panel was ordered (58 genes).  The following genes were evaluated for sequence changes and exonic deletions/duplications: APC, ATM, AXIN2, B  ? History of kidney stones   ? History of left breast cancer 12/06/2017  ? Hyperlipidemia 04/11/2019  ? Hypertension   ? Malignant neoplasm of upper-inner quadrant of left breast in female, estrogen receptor negative ([place]) 06/21/2017  ? Palpitation 02/01/2016  ? PAT (paroxysmal atrial  tachycardia) ([place]) 10/30/2016  ? Personal history of colonic polyps   ? Personal history of skin cancer   ? Port-A-Cath in place 07/01/2017  ? SVT (supraventricular tachycardia) ([place]) 05/31/2018  ? ?Assessment: ?76 year old female with history of afib. Notes state she was prescribed apixaban but she couldn't afford it and was not taking it on admit. Planning cath but patient with aki so this is being postponed until next week. Will start heparin.  ? ?3/25 AM update:  ?Heparin level therapeutic  ? ?Goal of Therapy:  ?Heparin level 0.3-0.7 units/ml ?Monitor platelets by anticoagulation protocol: Yes ?  ?Plan:  ?Cont heparin 1200 units/hr ?8 hour heparin level ? ?Narda Bonds, PharmD, BCPS ?Clinical Pharmacist ?Phone: [phone] ? ? ?

## 2021-11-21 NOTE — Progress Notes (Addendum)
? ?Progress Note ? ?Patient Name: Lindsay [name] ?Date of Encounter: 11/21/2021 ? ?[facility] Cardiologist: Shirlee More, MD  ? ?Subjective  ?Cr stable at 1.98 (1.96> 1.96 > 1.93 > 1.98).  Hemoglobin 10.3.  BP 90/40s this AM.  She denies any chest pain or dyspnea. ? ? ?Inpatient Medications  ?  ?Scheduled Meds: ? acebutolol  200 mg Oral BID  ? atorvastatin  10 mg Oral QHS  ? cloNIDine  0.1 mg Oral BID  ? hydrALAZINE  37.5 mg Oral TID  ? magnesium oxide  400 mg Oral Daily  ? montelukast  10 mg Oral Daily  ? omega-3 acid ethyl esters  1,000 mg Oral Daily  ? sodium chloride flush  3 mL Intravenous Q12H  ? sodium chloride flush  3 mL Intravenous Q12H  ? ?Continuous Infusions: ? sodium chloride    ? sodium chloride    ? sodium chloride 10 mL/hr at 11/20/21 1500  ? heparin 1,200 Units/hr (11/21/21 0736)  ? ?PRN Meds: ?sodium chloride, sodium chloride, acetaminophen, ipratropium-albuterol, ondansetron (ZOFRAN) IV, sodium chloride flush, sodium chloride flush  ? ?Vital Signs  ?  ?Vitals:  ? 11/20/21 1539 11/20/21 2044 11/20/21 2150 11/21/21 0419  ?BP: (!) 105/44 (!) 134/55 138/60 130/65  ?Pulse: 87 79  73  ?Resp: 17 17  16   ?Temp: 97.6 ?F (36.4 ?C) 98.1 ?F (36.7 ?C)  98.3 ?F (36.8 ?C)  ?TempSrc: Oral Oral  Oral  ?SpO2: 96%     ?Weight:    126.4 kg  ?Height:      ? ? ?Intake/Output Summary (Last 24 hours) at 11/21/2021 0853 ?Last data filed at 11/21/2021 4580 ?Gross per 24 hour  ?Intake 243 ml  ?Output 400 ml  ?Net -157 ml  ? ? ? ?  11/21/2021  ?  4:19 AM 11/20/2021  ?  6:20 AM 11/19/2021  ?  5:36 AM  ?Last 3 Weights  ?Weight (lbs) 278 lb 10.6 oz 275 lb 6.4 oz 276 lb 1.6 oz  ?Weight (kg) 126.4 kg 124.921 kg 125.238 kg  ?   ? ?Telemetry  ?  ?Possible a fib.  - Personally Reviewed ? ?ECG  ?  ?No new - Personally Reviewed ? ?Physical Exam  ? ?GEN: No acute distress.   ?Neck: No JVD ?Cardiac: irreg, + murmurs, norubs, or gallops.  ?Respiratory: Clear to auscultation bilaterally. ?GI: Soft, nontender, non-distended  ?MS: No edema;  No deformity. ?Neuro:  Nonfocal  ?Psych: Normal affect  ? ?Labs  ?  ?High Sensitivity Troponin:   ?Recent Labs  ?Lab 11/17/21 ?1556 11/17/21 ?2225  ?TROPONINIHS 23* 29*  ? ?   ?Chemistry ?Recent Labs  ?Lab 11/20/21 ?9983 11/20/21 ?1330 11/21/21 ?0001  ?NA 139 138 136  ?K 3.6 4.0 3.8  ?CL 105 104 103  ?CO2 24 25 25   ?GLUCOSE 83 99 90  ?BUN 27* 24* 31*  ?CREATININE 1.96* 1.93* 1.98*  ?CALCIUM 8.6* 8.7* 8.7*  ?GFRNONAA 26* 26* 25*  ?ANIONGAP 10 9 8   ? ?  ?Lipids No results for input(s): CHOL, TRIG, HDL, LABVLDL, LDLCALC, CHOLHDL in the last 168 hours.  ?Hematology ?Recent Labs  ?Lab 11/17/21 ?1556 11/19/21 ?2136 11/21/21 ?0001  ?WBC 11.9* 10.2 9.1  ?RBC 3.86* 3.87 3.44*  ?HGB 11.4* 11.3* 10.3*  ?HCT 35.1* 35.4* 31.6*  ?MCV 90.9 91.5 91.9  ?MCH 29.5 29.2 29.9  ?MCHC 32.5 31.9 32.6  ?RDW 16.3* 16.3* 16.3*  ?PLT 326 298 268  ? ? ?Thyroid No results for input(s): TSH, FREET4 in the  last 168 hours.  ?BNP ?Recent Labs  ?Lab 11/17/21 ?1556  ?BNP 765.5*  ? ?  ?DDimer No results for input(s): DDIMER in the last 168 hours.  ? ?Radiology  ?  ?No results found. ? ?Cardiac Studies  ? ?Echo 11/18/21 ? ?IMPRESSIONS  ? ? ? 1. Moderate to severe aortic stenosis is present. Vmax 3.6 m/s, MG 29  ?mmHG, AVA 0.69cm2, DI 0.20. Findings may represent paradoxical low flow  ?low gradient aortic stenosis due to low SV index (22 cc/m2). Would  ?recommend an aortic valve calcium score or  ?TEE for clarification. The aortic valve is tricuspid. There is severe  ?calcifcation of the aortic valve. There is severe thickening of the aortic  ?valve. Aortic valve regurgitation is not visualized. Moderate to severe  ?aortic valve stenosis.  ? 2. Left ventricular ejection fraction, by estimation, is 55 to 60%. The  ?left ventricle has normal function. The left ventricle has no regional  ?wall motion abnormalities. There is mild concentric left ventricular  ?hypertrophy. Left ventricular diastolic  ?function could not be evaluated.  ? 3. Right ventricular  systolic function is normal. The right ventricular  ?size is normal. There is normal pulmonary artery systolic pressure.  ? 4. Left atrial size was mildly dilated.  ? 5. Right atrial size was mildly dilated.  ? 6. The mitral valve is degenerative. Mild to moderate mitral valve  ?regurgitation.  ? 7. The inferior vena cava is normal in size with greater than 50%  ?respiratory variability, suggesting right atrial pressure of 3 mmHg.  ? ?Comparison(s): No significant change from prior study.  ? ?FINDINGS  ? Left Ventricle: Left ventricular ejection fraction, by estimation, is 55  ?to 60%. The left ventricle has normal function. The left ventricle has no  ?regional wall motion abnormalities. The left ventricular internal cavity  ?size was normal in size. There is  ? mild concentric left ventricular hypertrophy. Left ventricular diastolic  ?function could not be evaluated due to atrial fibrillation. Left  ?ventricular diastolic function could not be evaluated.  ? ?Right Ventricle: The right ventricular size is normal. No increase in  ?right ventricular wall thickness. Right ventricular systolic function is  ?normal. There is normal pulmonary artery systolic pressure. The tricuspid  ?regurgitant velocity is 2.82 m/s, and  ? with an assumed right atrial pressure of 3 mmHg, the estimated right  ?ventricular systolic pressure is 58.0 mmHg.  ? ?Left Atrium: Left atrial size was mildly dilated.  ? ?Right Atrium: Right atrial size was mildly dilated.  ? ?Pericardium: Trivial pericardial effusion is present.  ? ?Mitral Valve: The mitral valve is degenerative in appearance. Mild to  ?moderate mitral annular calcification. Mild to moderate mitral valve  ?regurgitation.  ? ?Tricuspid Valve: The tricuspid valve is grossly normal. Tricuspid valve  ?regurgitation is mild . No evidence of tricuspid stenosis.  ? ?Aortic Valve: Moderate to severe aortic stenosis is present. Vmax 3.6 m/s,  ?MG 29 mmHG, AVA 0.69cm2, DI 0.20. Findings may  represent paradoxical low  ?flow low gradient aortic stenosis due to low SV index (22 cc/m2). Would  ?recommend an aortic valve calcium  ?score or TEE for clarification. The aortic valve is tricuspid. There is  ?severe calcifcation of the aortic valve. There is severe thickening of the  ?aortic valve. Aortic valve regurgitation is not visualized. Moderate to  ?severe aortic stenosis is present.  ?Aortic valve mean gradient measures 29.0 mmHg. Aortic valve peak gradient  ?measures 50.7 mmHg. Aortic valve area, by VTI  measures 0.69 cm?.  ? ?Pulmonic Valve: The pulmonic valve was grossly normal. Pulmonic valve  ?regurgitation is mild. No evidence of pulmonic stenosis.  ? ?Aorta: The aortic root and ascending aorta are structurally normal, with  ?no evidence of dilitation.  ? ?Venous: The inferior vena cava is normal in size with greater than 50%  ?respiratory variability, suggesting right atrial pressure of 3 mmHg.  ? ?IAS/Shunts: The atrial septum is grossly normal.  ? ?   ?LEFT VENTRICLE  ?PLAX 2D  ?LVIDd:         4.90 cm  ?LVIDs:         3.80 cm  ?LV PW:         1.30 cm  ?LV IVS:        1.30 cm  ?LVOT diam:     2.10 cm  ?LV SV:         51  ?LV SV Index:   22  ?LVOT Area:     3.46 cm?  ?   ?LV Volumes (MOD)  ?LV vol d, MOD A2C: 84.5 ml  ?LV vol d, MOD A4C: 91.9 ml  ?LV vol s, MOD A2C: 42.3 ml  ?LV vol s, MOD A4C: 41.8 ml  ?LV SV MOD A2C:     42.2 ml  ?LV SV MOD A4C:     91.9 ml  ?LV SV MOD BP:      49.2 ml  ? ?Patient Profile  ?   ?76 y.o. female with hx of severe symptomatic AS, HTN, HLD, atrial tach, hypercalcemia and CKD. Now with volume overload and eval for TAVR.  ? ?Assessment & Plan  ?  ?Aortic stenosis: The patient has history of known aortic stenosis.  She developed volume overload and was transferred from [place] up to [facility] for further evaluation and management. ?-Lasix was held due to worsening renal function with diuresis ?-Had planned for LHC/RHC on 3/24, but was canceled due to elevated  creatinine.  Tentatively plan for Monday if stable creatinine ?  ?Acute on chronic diastolic CHF:  Her AS is a significant contributor to her CHF symptoms  see above.  Hold off on further diuresis given her renal

## 2021-11-21 NOTE — Progress Notes (Signed)
ANTICOAGULATION CONSULT NOTE - Follow-up Consult ? ?Pharmacy Consult for heparin ?Indication: atrial fibrillation ? ?No Known Allergies ? ?Patient Measurements: ?Height: '5\' 7"'  (170.2 cm) ?Weight: 126.4 kg (278 lb 10.6 oz) ?IBW/kg (Calculated) : 61.6 ?Heparin Dosing Weight:92kg ? ?Vital Signs: ?Temp: 98.3 ?F (36.8 ?C) (03/25 0419) ?Temp Source: Oral (03/25 0419) ?BP: 108/50 (03/25 1024) ?Pulse Rate: 74 (03/25 1024) ? ?Labs: ?Recent Labs  ?  11/19/21 ?2136 11/20/21 ?2353 11/20/21 ?1330 11/21/21 ?0001 11/21/21 ?0946  ?HGB 11.3*  --   --  10.3*  --   ?HCT 35.4*  --   --  31.6*  --   ?PLT 298  --   --  268  --   ?HEPARINUNFRC  --   --   --  0.33 0.29*  ?CREATININE 1.96* 1.96* 1.93* 1.98*  --   ? ? ? ?Estimated Creatinine Clearance: 32.3 mL/min (A) (by C-G formula based on SCr of 1.98 mg/dL (H)). ? ? ?Medical History: ?Past Medical History:  ?Diagnosis Date  ? 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 Baker's cyst of knee, left 02/12/2020  ? Breast cancer ([place])   ? Cancer [facility])   ? left breast cancer  ? Chronic pain of left knee 02/12/2020  ? Dyspnea   ? Dysrhythmia   ? PACs and nonsustained runs of atrial tach by Holter 10/13/16  ? Family history of breast cancer   ? Family history of colon cancer   ? Family history of melanoma   ? Family history of pancreatic cancer   ? Genetic testing 09/15/2017  ? The patient had genetic testing due to a personal history of breast cancer and skin cancer,  and a family history of breast cancer, pancreatic cancer, melanoma, and colon cancer.  The Common Hereditary Cancer Panel + Melanoma Panel + Basal Cell Nevus Syndrome Panel was ordered (58 genes).  The following genes were evaluated for sequence changes and exonic deletions/duplications: APC, ATM, AXIN2, B  ? History of kidney stones   ? History of left breast cancer 12/06/2017  ? Hyperlipidemia 04/11/2019  ? Hypertension   ? Malignant neoplasm of upper-inner quadrant of left breast in female, estrogen receptor negative ([place])  06/21/2017  ? Palpitation 02/01/2016  ? PAT (paroxysmal atrial tachycardia) ([place]) 10/30/2016  ? Personal history of colonic polyps   ? Personal history of skin cancer   ? Port-A-Cath in place 07/01/2017  ? SVT (supraventricular tachycardia) ([place]) 05/31/2018  ? ?Assessment: ?76 year old female with history of afib. Notes state she was prescribed apixaban but she couldn't afford it and was not taking it on admit. Planning cath but patient with aki so this is being postponed until next week. Will start heparin.  ? ?Heparin level this AM is 0.29 and slightly below goal on 1200 units/h. Hgb and platelets remaining stable. No signs of bleeding noted. No IV site issues per RN.  ? ?Goal of Therapy:  ?Heparin level 0.3-0.7 units/ml ?Monitor platelets by anticoagulation protocol: Yes ?  ?Plan:  ?Increase IV heparin gtt to 1350 units/h ?8h heparin level ?Daily heparin level, CBC ?Monitor for signs and symptoms of bleeding ? ?Thank you for involving pharmacy in this patient's care. ? ?Elita Quick, PharmD ?PGY1 Ambulatory Care Pharmacy Resident ?11/21/2021 11:20 AM ? ?**Pharmacist phone directory can be found on [email or web address] listed under [place]** ? ?

## 2021-11-22 LAB — BASIC METABOLIC PANEL
Anion gap: 7 (ref 5–15)
BUN: 29 mg/dL — ABNORMAL HIGH (ref 8–23)
CO2: 27 mmol/L (ref 22–32)
Calcium: 8.8 mg/dL — ABNORMAL LOW (ref 8.9–10.3)
Chloride: 104 mmol/L (ref 98–111)
Creatinine, Ser: 1.85 mg/dL — ABNORMAL HIGH (ref 0.44–1.00)
GFR, Estimated: 28 mL/min — ABNORMAL LOW (ref >60)
Glucose, Bld: 92 mg/dL (ref 70–99)
Potassium: 3.8 mmol/L (ref 3.5–5.1)
Sodium: 138 mmol/L (ref 135–145)

## 2021-11-22 LAB — CBC
HCT: 33 % — ABNORMAL LOW (ref 36.0–46.0)
Hemoglobin: 10.6 g/dL — ABNORMAL LOW (ref 12.0–15.0)
MCH: 30 pg (ref 26.0–34.0)
MCHC: 32.1 g/dL (ref 30.0–36.0)
MCV: 93.5 fL (ref 80.0–100.0)
Platelets: 219 10*3/uL (ref 150–400)
RBC: 3.53 MIL/uL — ABNORMAL LOW (ref 3.87–5.11)
RDW: 16 % — ABNORMAL HIGH (ref 11.5–15.5)
WBC: 7.9 10*3/uL (ref 4.0–10.5)
nRBC: 0 % (ref 0.0–0.2)

## 2021-11-22 LAB — HEPARIN LEVEL (UNFRACTIONATED): Heparin Unfractionated: 0.56 IU/mL (ref 0.30–0.70)

## 2021-11-22 MED ORDER — HYDRALAZINE HCL 25 MG PO TABS
25.0000 mg | ORAL_TABLET | Freq: Three times a day (TID) | ORAL | Status: DC
Start: 2021-11-22 — End: 2021-11-25
  Administered 2021-11-22 – 2021-11-25 (×9): 25 mg via ORAL
  Filled 2021-11-22 (×9): qty 1

## 2021-11-22 NOTE — Progress Notes (Signed)
Pt had a 15 beat run of V Tach. Pt asymptomatic. Cardiology paged.  ?

## 2021-11-22 NOTE — H&P (View-Only) (Signed)
? ?Progress Note ? ?Patient Name: Lindsay [name] ?Date of Encounter: 11/22/2021 ? ?[facility] Cardiologist: Shirlee More, MD  ? ?Subjective  ?Cr stable at 1.85.  Hemoglobin 10.6.  BP 142/68 this AM.  She denies any chest pain or dyspnea. ? ? ?Inpatient Medications  ?  ?Scheduled Meds: ? atorvastatin  10 mg Oral QHS  ? docusate sodium  100 mg Oral BID  ? magnesium oxide  400 mg Oral Daily  ? metoprolol tartrate  25 mg Oral BID  ? montelukast  10 mg Oral Daily  ? omega-3 acid ethyl esters  1,000 mg Oral Daily  ? sodium chloride flush  3 mL Intravenous Q12H  ? sodium chloride flush  3 mL Intravenous Q12H  ? ?Continuous Infusions: ? sodium chloride    ? sodium chloride    ? sodium chloride 10 mL/hr at 11/21/21 1508  ? heparin 1,350 Units/hr (11/22/21 0528)  ? ?PRN Meds: ?sodium chloride, sodium chloride, acetaminophen, guaiFENesin-dextromethorphan, ipratropium-albuterol, ondansetron (ZOFRAN) IV, polyethylene glycol, sodium chloride flush, sodium chloride flush  ? ?Vital Signs  ?  ?Vitals:  ? 11/21/21 1732 11/21/21 2108 11/22/21 0867 11/22/21 0825  ?BP: (!) 141/69 131/60 (!) 140/57 (!) 142/68  ?Pulse: 76 78 63 78  ?Resp: 18 18 16    ?Temp: (!) 97.3 ?F (36.3 ?C) 97.6 ?F (36.4 ?C) 98.3 ?F (36.8 ?C)   ?TempSrc: Oral Oral Axillary   ?SpO2:      ?Weight:   126.9 kg   ?Height:      ? ? ?Intake/Output Summary (Last 24 hours) at 11/22/2021 1222 ?Last data filed at 11/21/2021 1508 ?Gross per 24 hour  ?Intake 948.3 ml  ?Output --  ?Net 948.3 ml  ? ? ? ?  11/22/2021  ?  5:21 AM 11/21/2021  ?  4:19 AM 11/20/2021  ?  6:20 AM  ?Last 3 Weights  ?Weight (lbs) 279 lb 12.2 oz 278 lb 10.6 oz 275 lb 6.4 oz  ?Weight (kg) 126.9 kg 126.4 kg 124.921 kg  ?   ? ?Telemetry  ?  ?Possible a fib.  - Personally Reviewed ? ?ECG  ?  ?No new - Personally Reviewed ? ?Physical Exam  ? ?GEN: No acute distress.   ?Neck: No JVD ?Cardiac: irreg, + murmurs, norubs, or gallops.  ?Respiratory: Clear to auscultation bilaterally. ?GI: Soft, nontender, non-distended   ?MS: No edema; No deformity. ?Neuro:  Nonfocal  ?Psych: Normal affect  ? ?Labs  ?  ?High Sensitivity Troponin:   ?Recent Labs  ?Lab 11/17/21 ?1556 11/17/21 ?2225  ?TROPONINIHS 23* 29*  ? ?   ?Chemistry ?Recent Labs  ?Lab 11/20/21 ?1330 11/21/21 ?0001 11/22/21 ?6195  ?NA 138 136 138  ?K 4.0 3.8 3.8  ?CL 104 103 104  ?CO2 25 25 27   ?GLUCOSE 99 90 92  ?BUN 24* 31* 29*  ?CREATININE 1.93* 1.98* 1.85*  ?CALCIUM 8.7* 8.7* 8.8*  ?GFRNONAA 26* 25* 28*  ?ANIONGAP 9 8 7   ? ?  ?Lipids No results for input(s): CHOL, TRIG, HDL, LABVLDL, LDLCALC, CHOLHDL in the last 168 hours.  ?Hematology ?Recent Labs  ?Lab 11/19/21 ?2136 11/21/21 ?0001 11/22/21 ?0932  ?WBC 10.2 9.1 7.9  ?RBC 3.87 3.44* 3.53*  ?HGB 11.3* 10.3* 10.6*  ?HCT 35.4* 31.6* 33.0*  ?MCV 91.5 91.9 93.5  ?MCH 29.2 29.9 30.0  ?MCHC 31.9 32.6 32.1  ?RDW 16.3* 16.3* 16.0*  ?PLT 298 268 219  ? ? ?Thyroid No results for input(s): TSH, FREET4 in the last 168 hours.  ?BNP ?Recent Labs  ?  Lab 11/17/21 ?1556  ?BNP 765.5*  ? ?  ?DDimer No results for input(s): DDIMER in the last 168 hours.  ? ?Radiology  ?  ?DG CHEST PORT 1 VIEW ? ?Result Date: 11/21/2021 ?CLINICAL DATA:  Aortic stenosis.  Shortness of breath. EXAM: PORTABLE CHEST 1 VIEW COMPARISON:  11/17/2021 and older exams. FINDINGS: Cardiac silhouette is mildly enlarged. No mediastinal or hilar masses. Lungs are hyperexpanded. Prominent interstitial markings most evident in the bases. Mild additional linear/reticular lung base opacities are noted consistent with atelectasis and/or scarring. No evidence of pneumonia or pulmonary edema. No convincing pleural effusion and no pneumothorax. Skeletal structures are grossly intact. IMPRESSION: 1. No acute cardiopulmonary disease. 2. COPD.  Mild cardiomegaly. Electronically Signed   By: Lajean Manes M.D.   On: 11/21/2021 12:03   ? ?Cardiac Studies  ? ?Echo 11/18/21 ? ?IMPRESSIONS  ? ? ? 1. Moderate to severe aortic stenosis is present. Vmax 3.6 m/s, MG 29  ?mmHG, AVA 0.69cm2, DI 0.20.  Findings may represent paradoxical low flow  ?low gradient aortic stenosis due to low SV index (22 cc/m2). Would  ?recommend an aortic valve calcium score or  ?TEE for clarification. The aortic valve is tricuspid. There is severe  ?calcifcation of the aortic valve. There is severe thickening of the aortic  ?valve. Aortic valve regurgitation is not visualized. Moderate to severe  ?aortic valve stenosis.  ? 2. Left ventricular ejection fraction, by estimation, is 55 to 60%. The  ?left ventricle has normal function. The left ventricle has no regional  ?wall motion abnormalities. There is mild concentric left ventricular  ?hypertrophy. Left ventricular diastolic  ?function could not be evaluated.  ? 3. Right ventricular systolic function is normal. The right ventricular  ?size is normal. There is normal pulmonary artery systolic pressure.  ? 4. Left atrial size was mildly dilated.  ? 5. Right atrial size was mildly dilated.  ? 6. The mitral valve is degenerative. Mild to moderate mitral valve  ?regurgitation.  ? 7. The inferior vena cava is normal in size with greater than 50%  ?respiratory variability, suggesting right atrial pressure of 3 mmHg.  ? ?Comparison(s): No significant change from prior study.  ? ?FINDINGS  ? Left Ventricle: Left ventricular ejection fraction, by estimation, is 55  ?to 60%. The left ventricle has normal function. The left ventricle has no  ?regional wall motion abnormalities. The left ventricular internal cavity  ?size was normal in size. There is  ? mild concentric left ventricular hypertrophy. Left ventricular diastolic  ?function could not be evaluated due to atrial fibrillation. Left  ?ventricular diastolic function could not be evaluated.  ? ?Right Ventricle: The right ventricular size is normal. No increase in  ?right ventricular wall thickness. Right ventricular systolic function is  ?normal. There is normal pulmonary artery systolic pressure. The tricuspid  ?regurgitant velocity is  2.82 m/s, and  ? with an assumed right atrial pressure of 3 mmHg, the estimated right  ?ventricular systolic pressure is 01.7 mmHg.  ? ?Left Atrium: Left atrial size was mildly dilated.  ? ?Right Atrium: Right atrial size was mildly dilated.  ? ?Pericardium: Trivial pericardial effusion is present.  ? ?Mitral Valve: The mitral valve is degenerative in appearance. Mild to  ?moderate mitral annular calcification. Mild to moderate mitral valve  ?regurgitation.  ? ?Tricuspid Valve: The tricuspid valve is grossly normal. Tricuspid valve  ?regurgitation is mild . No evidence of tricuspid stenosis.  ? ?Aortic Valve: Moderate to severe aortic stenosis is present. Vmax  3.6 m/s,  ?MG 29 mmHG, AVA 0.69cm2, DI 0.20. Findings may represent paradoxical low  ?flow low gradient aortic stenosis due to low SV index (22 cc/m2). Would  ?recommend an aortic valve calcium  ?score or TEE for clarification. The aortic valve is tricuspid. There is  ?severe calcifcation of the aortic valve. There is severe thickening of the  ?aortic valve. Aortic valve regurgitation is not visualized. Moderate to  ?severe aortic stenosis is present.  ?Aortic valve mean gradient measures 29.0 mmHg. Aortic valve peak gradient  ?measures 50.7 mmHg. Aortic valve area, by VTI measures 0.69 cm?.  ? ?Pulmonic Valve: The pulmonic valve was grossly normal. Pulmonic valve  ?regurgitation is mild. No evidence of pulmonic stenosis.  ? ?Aorta: The aortic root and ascending aorta are structurally normal, with  ?no evidence of dilitation.  ? ?Venous: The inferior vena cava is normal in size with greater than 50%  ?respiratory variability, suggesting right atrial pressure of 3 mmHg.  ? ?IAS/Shunts: The atrial septum is grossly normal.  ? ?   ?LEFT VENTRICLE  ?PLAX 2D  ?LVIDd:         4.90 cm  ?LVIDs:         3.80 cm  ?LV PW:         1.30 cm  ?LV IVS:        1.30 cm  ?LVOT diam:     2.10 cm  ?LV SV:         51  ?LV SV Index:   22  ?LVOT Area:     3.46 cm?  ?   ?LV Volumes  (MOD)  ?LV vol d, MOD A2C: 84.5 ml  ?LV vol d, MOD A4C: 91.9 ml  ?LV vol s, MOD A2C: 42.3 ml  ?LV vol s, MOD A4C: 41.8 ml  ?LV SV MOD A2C:     42.2 ml  ?LV SV MOD A4C:     91.9 ml  ?LV SV MOD BP:      49.2 ml

## 2021-11-22 NOTE — Progress Notes (Signed)
ANTICOAGULATION CONSULT NOTE - Follow-up Consult ? ?Pharmacy Consult for heparin ?Indication: atrial fibrillation ? ?No Known Allergies ? ?Patient Measurements: ?Height: _0  (170.2 cm) ?Weight: 126.9 kg (279 lb 12.2 oz) ?IBW/kg (Calculated) : 61.6 ?Heparin Dosing Weight:92kg ? ?Vital Signs: ?Temp: 98.3 ?F (36.8 ?C) (03/26 9150) ?Temp Source: Axillary (03/26 0521) ?BP: 142/68 (03/26 0825) ?Pulse Rate: 78 (03/26 0825) ? ?Labs: ?Recent Labs  ?  11/19/21 ?2136 11/19/21 ?2136 11/20/21 ?5697 11/20/21 ?1330 11/21/21 ?0001 11/21/21 ?0946 11/21/21 ?1916 11/22/21 ?9480  ?HGB 11.3*  --   --   --  10.3*  --   --  10.6*  ?HCT 35.4*  --   --   --  31.6*  --   --  33.0*  ?PLT 298  --   --   --  268  --   --  219  ?HEPARINUNFRC  --    < >  --   --  0.33 0.29* 0.53 0.56  ?CREATININE 1.96*  --    < > 1.93* 1.98*  --   --  1.85*  ? < > = values in this interval not displayed.  ? ? ? ?Estimated Creatinine Clearance: 34.7 mL/min (A) (by C-G formula based on SCr of 1.85 mg/dL (H)). ? ? ?Medical History: ?Past Medical History:  ?Diagnosis Date  ? 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 Baker's cyst of knee, left 02/12/2020  ? Breast cancer ([place])   ? Cancer [facility])   ? left breast cancer  ? Chronic pain of left knee 02/12/2020  ? Dyspnea   ? Dysrhythmia   ? PACs and nonsustained runs of atrial tach by Holter 10/13/16  ? Family history of breast cancer   ? Family history of colon cancer   ? Family history of melanoma   ? Family history of pancreatic cancer   ? Genetic testing 09/15/2017  ? The patient had genetic testing due to a personal history of breast cancer and skin cancer,  and a family history of breast cancer, pancreatic cancer, melanoma, and colon cancer.  The Common Hereditary Cancer Panel + Melanoma Panel + Basal Cell Nevus Syndrome Panel was ordered (58 genes).  The following genes were evaluated for sequence changes and exonic deletions/duplications: APC, ATM, AXIN2, B  ? History of kidney stones   ? History of left  breast cancer 12/06/2017  ? Hyperlipidemia 04/11/2019  ? Hypertension   ? Malignant neoplasm of upper-inner quadrant of left breast in female, estrogen receptor negative ([place]) 06/21/2017  ? Palpitation 02/01/2016  ? PAT (paroxysmal atrial tachycardia) ([place]) 10/30/2016  ? Personal history of colonic polyps   ? Personal history of skin cancer   ? Port-A-Cath in place 07/01/2017  ? SVT (supraventricular tachycardia) ([place]) 05/31/2018  ? ?Assessment: ?76 year old female with history of afib. Notes state she was prescribed apixaban but she couldn't afford it and was not taking it on admit. Planning cath but patient with aki so this is being postponed until next week. Will start heparin.  ? ?Heparin level this AM is 0.56 and at goal on 1350 units/h. Hgb and platelets remaining stable. No signs of bleeding or IV site issues reported. ? ?Goal of Therapy:  ?Heparin level 0.3-0.7 units/ml ?Monitor platelets by anticoagulation protocol: Yes ?  ?Plan:  ?Continue IV heparin gtt 1350 units/h ?Daily heparin level, CBC ?Monitor for signs and symptoms of bleeding ? ?Thank you for involving pharmacy in this patient's care. ? ?Elita Quick, PharmD ?PGY1 Ambulatory Care Pharmacy Resident ?  11/22/2021 9:54 AM ? ?**Pharmacist phone directory can be found on [email or web address] listed under [place]** ? ?

## 2021-11-22 NOTE — Progress Notes (Signed)
? ?Progress Note ? ?Patient Name: Lindsay [name] ?Date of Encounter: 11/22/2021 ? ?[facility] Cardiologist: Shirlee More, MD  ? ?Subjective  ?Cr stable at 1.85.  Hemoglobin 10.6.  BP 142/68 this AM.  She denies any chest pain or dyspnea. ? ? ?Inpatient Medications  ?  ?Scheduled Meds: ? atorvastatin  10 mg Oral QHS  ? docusate sodium  100 mg Oral BID  ? magnesium oxide  400 mg Oral Daily  ? metoprolol tartrate  25 mg Oral BID  ? montelukast  10 mg Oral Daily  ? omega-3 acid ethyl esters  1,000 mg Oral Daily  ? sodium chloride flush  3 mL Intravenous Q12H  ? sodium chloride flush  3 mL Intravenous Q12H  ? ?Continuous Infusions: ? sodium chloride    ? sodium chloride    ? sodium chloride 10 mL/hr at 11/21/21 1508  ? heparin 1,350 Units/hr (11/22/21 0528)  ? ?PRN Meds: ?sodium chloride, sodium chloride, acetaminophen, guaiFENesin-dextromethorphan, ipratropium-albuterol, ondansetron (ZOFRAN) IV, polyethylene glycol, sodium chloride flush, sodium chloride flush  ? ?Vital Signs  ?  ?Vitals:  ? 11/21/21 1732 11/21/21 2108 11/22/21 4315 11/22/21 0825  ?BP: (!) 141/69 131/60 (!) 140/57 (!) 142/68  ?Pulse: 76 78 63 78  ?Resp: 18 18 16    ?Temp: (!) 97.3 ?F (36.3 ?C) 97.6 ?F (36.4 ?C) 98.3 ?F (36.8 ?C)   ?TempSrc: Oral Oral Axillary   ?SpO2:      ?Weight:   126.9 kg   ?Height:      ? ? ?Intake/Output Summary (Last 24 hours) at 11/22/2021 1222 ?Last data filed at 11/21/2021 1508 ?Gross per 24 hour  ?Intake 948.3 ml  ?Output --  ?Net 948.3 ml  ? ? ? ?  11/22/2021  ?  5:21 AM 11/21/2021  ?  4:19 AM 11/20/2021  ?  6:20 AM  ?Last 3 Weights  ?Weight (lbs) 279 lb 12.2 oz 278 lb 10.6 oz 275 lb 6.4 oz  ?Weight (kg) 126.9 kg 126.4 kg 124.921 kg  ?   ? ?Telemetry  ?  ?Possible a fib.  - Personally Reviewed ? ?ECG  ?  ?No new - Personally Reviewed ? ?Physical Exam  ? ?GEN: No acute distress.   ?Neck: No JVD ?Cardiac: irreg, + murmurs, norubs, or gallops.  ?Respiratory: Clear to auscultation bilaterally. ?GI: Soft, nontender, non-distended   ?MS: No edema; No deformity. ?Neuro:  Nonfocal  ?Psych: Normal affect  ? ?Labs  ?  ?High Sensitivity Troponin:   ?Recent Labs  ?Lab 11/17/21 ?1556 11/17/21 ?2225  ?TROPONINIHS 23* 29*  ? ?   ?Chemistry ?Recent Labs  ?Lab 11/20/21 ?1330 11/21/21 ?0001 11/22/21 ?4008  ?NA 138 136 138  ?K 4.0 3.8 3.8  ?CL 104 103 104  ?CO2 25 25 27   ?GLUCOSE 99 90 92  ?BUN 24* 31* 29*  ?CREATININE 1.93* 1.98* 1.85*  ?CALCIUM 8.7* 8.7* 8.8*  ?GFRNONAA 26* 25* 28*  ?ANIONGAP 9 8 7   ? ?  ?Lipids No results for input(s): CHOL, TRIG, HDL, LABVLDL, LDLCALC, CHOLHDL in the last 168 hours.  ?Hematology ?Recent Labs  ?Lab 11/19/21 ?2136 11/21/21 ?0001 11/22/21 ?6761  ?WBC 10.2 9.1 7.9  ?RBC 3.87 3.44* 3.53*  ?HGB 11.3* 10.3* 10.6*  ?HCT 35.4* 31.6* 33.0*  ?MCV 91.5 91.9 93.5  ?MCH 29.2 29.9 30.0  ?MCHC 31.9 32.6 32.1  ?RDW 16.3* 16.3* 16.0*  ?PLT 298 268 219  ? ? ?Thyroid No results for input(s): TSH, FREET4 in the last 168 hours.  ?BNP ?Recent Labs  ?  Lab 11/17/21 ?1556  ?BNP 765.5*  ? ?  ?DDimer No results for input(s): DDIMER in the last 168 hours.  ? ?Radiology  ?  ?DG CHEST PORT 1 VIEW ? ?Result Date: 11/21/2021 ?CLINICAL DATA:  Aortic stenosis.  Shortness of breath. EXAM: PORTABLE CHEST 1 VIEW COMPARISON:  11/17/2021 and older exams. FINDINGS: Cardiac silhouette is mildly enlarged. No mediastinal or hilar masses. Lungs are hyperexpanded. Prominent interstitial markings most evident in the bases. Mild additional linear/reticular lung base opacities are noted consistent with atelectasis and/or scarring. No evidence of pneumonia or pulmonary edema. No convincing pleural effusion and no pneumothorax. Skeletal structures are grossly intact. IMPRESSION: 1. No acute cardiopulmonary disease. 2. COPD.  Mild cardiomegaly. Electronically Signed   By: Lajean Manes M.D.   On: 11/21/2021 12:03   ? ?Cardiac Studies  ? ?Echo 11/18/21 ? ?IMPRESSIONS  ? ? ? 1. Moderate to severe aortic stenosis is present. Vmax 3.6 m/s, MG 29  ?mmHG, AVA 0.69cm2, DI 0.20.  Findings may represent paradoxical low flow  ?low gradient aortic stenosis due to low SV index (22 cc/m2). Would  ?recommend an aortic valve calcium score or  ?TEE for clarification. The aortic valve is tricuspid. There is severe  ?calcifcation of the aortic valve. There is severe thickening of the aortic  ?valve. Aortic valve regurgitation is not visualized. Moderate to severe  ?aortic valve stenosis.  ? 2. Left ventricular ejection fraction, by estimation, is 55 to 60%. The  ?left ventricle has normal function. The left ventricle has no regional  ?wall motion abnormalities. There is mild concentric left ventricular  ?hypertrophy. Left ventricular diastolic  ?function could not be evaluated.  ? 3. Right ventricular systolic function is normal. The right ventricular  ?size is normal. There is normal pulmonary artery systolic pressure.  ? 4. Left atrial size was mildly dilated.  ? 5. Right atrial size was mildly dilated.  ? 6. The mitral valve is degenerative. Mild to moderate mitral valve  ?regurgitation.  ? 7. The inferior vena cava is normal in size with greater than 50%  ?respiratory variability, suggesting right atrial pressure of 3 mmHg.  ? ?Comparison(s): No significant change from prior study.  ? ?FINDINGS  ? Left Ventricle: Left ventricular ejection fraction, by estimation, is 55  ?to 60%. The left ventricle has normal function. The left ventricle has no  ?regional wall motion abnormalities. The left ventricular internal cavity  ?size was normal in size. There is  ? mild concentric left ventricular hypertrophy. Left ventricular diastolic  ?function could not be evaluated due to atrial fibrillation. Left  ?ventricular diastolic function could not be evaluated.  ? ?Right Ventricle: The right ventricular size is normal. No increase in  ?right ventricular wall thickness. Right ventricular systolic function is  ?normal. There is normal pulmonary artery systolic pressure. The tricuspid  ?regurgitant velocity is  2.82 m/s, and  ? with an assumed right atrial pressure of 3 mmHg, the estimated right  ?ventricular systolic pressure is 99.2 mmHg.  ? ?Left Atrium: Left atrial size was mildly dilated.  ? ?Right Atrium: Right atrial size was mildly dilated.  ? ?Pericardium: Trivial pericardial effusion is present.  ? ?Mitral Valve: The mitral valve is degenerative in appearance. Mild to  ?moderate mitral annular calcification. Mild to moderate mitral valve  ?regurgitation.  ? ?Tricuspid Valve: The tricuspid valve is grossly normal. Tricuspid valve  ?regurgitation is mild . No evidence of tricuspid stenosis.  ? ?Aortic Valve: Moderate to severe aortic stenosis is present. Vmax  3.6 m/s,  ?MG 29 mmHG, AVA 0.69cm2, DI 0.20. Findings may represent paradoxical low  ?flow low gradient aortic stenosis due to low SV index (22 cc/m2). Would  ?recommend an aortic valve calcium  ?score or TEE for clarification. The aortic valve is tricuspid. There is  ?severe calcifcation of the aortic valve. There is severe thickening of the  ?aortic valve. Aortic valve regurgitation is not visualized. Moderate to  ?severe aortic stenosis is present.  ?Aortic valve mean gradient measures 29.0 mmHg. Aortic valve peak gradient  ?measures 50.7 mmHg. Aortic valve area, by VTI measures 0.69 cm?.  ? ?Pulmonic Valve: The pulmonic valve was grossly normal. Pulmonic valve  ?regurgitation is mild. No evidence of pulmonic stenosis.  ? ?Aorta: The aortic root and ascending aorta are structurally normal, with  ?no evidence of dilitation.  ? ?Venous: The inferior vena cava is normal in size with greater than 50%  ?respiratory variability, suggesting right atrial pressure of 3 mmHg.  ? ?IAS/Shunts: The atrial septum is grossly normal.  ? ?   ?LEFT VENTRICLE  ?PLAX 2D  ?LVIDd:         4.90 cm  ?LVIDs:         3.80 cm  ?LV PW:         1.30 cm  ?LV IVS:        1.30 cm  ?LVOT diam:     2.10 cm  ?LV SV:         51  ?LV SV Index:   22  ?LVOT Area:     3.46 cm?  ?   ?LV Volumes  (MOD)  ?LV vol d, MOD A2C: 84.5 ml  ?LV vol d, MOD A4C: 91.9 ml  ?LV vol s, MOD A2C: 42.3 ml  ?LV vol s, MOD A4C: 41.8 ml  ?LV SV MOD A2C:     42.2 ml  ?LV SV MOD A4C:     91.9 ml  ?LV SV MOD BP:      49.2 ml

## 2021-11-23 ENCOUNTER — Encounter (HOSPITAL_COMMUNITY)
Admission: EM | Disposition: A | Discharge status: Home / Self Care | Payer: Self-pay | Source: Ambulatory Visit | Attending: Internal Medicine

## 2021-11-23 ENCOUNTER — Other Ambulatory Visit (HOSPITAL_COMMUNITY): Payer: Self-pay

## 2021-11-23 ENCOUNTER — Encounter (HOSPITAL_COMMUNITY): Payer: Self-pay | Admitting: Cardiovascular Disease

## 2021-11-23 DIAGNOSIS — I5033 Acute on chronic diastolic (congestive) heart failure: Secondary | ICD-10-CM

## 2021-11-23 HISTORY — PX: RIGHT HEART CATH AND CORONARY ANGIOGRAPHY: CATH118264

## 2021-11-23 LAB — BASIC METABOLIC PANEL
Anion gap: 4 — ABNORMAL LOW (ref 5–15)
BUN: 21 mg/dL (ref 8–23)
CO2: 25 mmol/L (ref 22–32)
Calcium: 8.9 mg/dL (ref 8.9–10.3)
Chloride: 108 mmol/L (ref 98–111)
Creatinine, Ser: 1.54 mg/dL — ABNORMAL HIGH (ref 0.44–1.00)
GFR, Estimated: 34 mL/min — ABNORMAL LOW (ref >60)
Glucose, Bld: 98 mg/dL (ref 70–99)
Potassium: 3.6 mmol/L (ref 3.5–5.1)
Sodium: 137 mmol/L (ref 135–145)

## 2021-11-23 LAB — POCT I-STAT EG7
Acid-Base Excess: 1 mmol/L (ref 0.0–2.0)
Bicarbonate: 27.5 mmol/L (ref 20.0–28.0)
Calcium, Ion: 1.26 mmol/L (ref 1.15–1.40)
HCT: 34 % — ABNORMAL LOW (ref 36.0–46.0)
Hemoglobin: 11.6 g/dL — ABNORMAL LOW (ref 12.0–15.0)
O2 Saturation: 70 %
Potassium: 3.8 mmol/L (ref 3.5–5.1)
Sodium: 141 mmol/L (ref 135–145)
TCO2: 29 mmol/L (ref 22–32)
pCO2, Ven: 49.8 mmHg (ref 44–60)
pH, Ven: 7.35 (ref 7.25–7.43)
pO2, Ven: 39 mmHg (ref 32–45)

## 2021-11-23 LAB — POCT I-STAT 7, (LYTES, BLD GAS, ICA,H+H)
Acid-Base Excess: 0 mmol/L (ref 0.0–2.0)
Bicarbonate: 25.5 mmol/L (ref 20.0–28.0)
Calcium, Ion: 1.25 mmol/L (ref 1.15–1.40)
HCT: 33 % — ABNORMAL LOW (ref 36.0–46.0)
Hemoglobin: 11.2 g/dL — ABNORMAL LOW (ref 12.0–15.0)
O2 Saturation: 96 %
Potassium: 3.8 mmol/L (ref 3.5–5.1)
Sodium: 141 mmol/L (ref 135–145)
TCO2: 27 mmol/L (ref 22–32)
pCO2 arterial: 45 mmHg (ref 32–48)
pH, Arterial: 7.362 (ref 7.35–7.45)
pO2, Arterial: 87 mmHg (ref 83–108)

## 2021-11-23 LAB — CBC
HCT: 33.2 % — ABNORMAL LOW (ref 36.0–46.0)
Hemoglobin: 10.8 g/dL — ABNORMAL LOW (ref 12.0–15.0)
MCH: 30.1 pg (ref 26.0–34.0)
MCHC: 32.5 g/dL (ref 30.0–36.0)
MCV: 92.5 fL (ref 80.0–100.0)
Platelets: 238 10*3/uL (ref 150–400)
RBC: 3.59 MIL/uL — ABNORMAL LOW (ref 3.87–5.11)
RDW: 16 % — ABNORMAL HIGH (ref 11.5–15.5)
WBC: 7.6 10*3/uL (ref 4.0–10.5)
nRBC: 0 % (ref 0.0–0.2)

## 2021-11-23 LAB — HEPARIN LEVEL (UNFRACTIONATED): Heparin Unfractionated: 0.49 IU/mL (ref 0.30–0.70)

## 2021-11-23 SURGERY — RIGHT HEART CATH AND CORONARY ANGIOGRAPHY
Anesthesia: LOCAL

## 2021-11-23 MED ORDER — LABETALOL HCL 5 MG/ML IV SOLN: 10.0000 mg | INTRAVENOUS | Status: AC | PRN

## 2021-11-23 MED ORDER — LIDOCAINE HCL (PF) 1 % IJ SOLN
INTRAMUSCULAR | Status: DC | PRN
Start: 2021-11-23 — End: 2021-11-23
  Administered 2021-11-23: 5 mL

## 2021-11-23 MED ORDER — ASPIRIN 81 MG PO CHEW
CHEWABLE_TABLET | ORAL | Status: AC
  Filled 2021-11-23: qty 4

## 2021-11-23 MED ORDER — HEPARIN (PORCINE) IN NACL 1000-0.9 UT/500ML-% IV SOLN
INTRAVENOUS | Status: DC | PRN
  Administered 2021-11-23 (×2): 500 mL

## 2021-11-23 MED ORDER — SODIUM CHLORIDE 0.9% FLUSH: 3.0000 mL | Freq: Two times a day (BID) | INTRAVENOUS | Status: DC

## 2021-11-23 MED ORDER — SODIUM CHLORIDE 0.9% FLUSH: 3.0000 mL | INTRAVENOUS | Status: DC | PRN

## 2021-11-23 MED ORDER — VERAPAMIL HCL 2.5 MG/ML IV SOLN
INTRAVENOUS | Status: DC | PRN
  Administered 2021-11-23: 10 mL via INTRA_ARTERIAL

## 2021-11-23 MED ORDER — HEPARIN SODIUM (PORCINE) 1000 UNIT/ML IJ SOLN
INTRAMUSCULAR | Status: AC
  Filled 2021-11-23: qty 10

## 2021-11-23 MED ORDER — FUROSEMIDE 10 MG/ML IJ SOLN
40.0000 mg | Freq: Once | INTRAMUSCULAR | Status: AC
  Administered 2021-11-23: 40 mg via INTRAVENOUS
  Filled 2021-11-23: qty 4

## 2021-11-23 MED ORDER — FENTANYL CITRATE (PF) 100 MCG/2ML IJ SOLN
INTRAMUSCULAR | Status: AC
  Filled 2021-11-23: qty 2

## 2021-11-23 MED ORDER — HYDRALAZINE HCL 20 MG/ML IJ SOLN: 10.0000 mg | INTRAMUSCULAR | Status: AC | PRN

## 2021-11-23 MED ORDER — HEPARIN (PORCINE) IN NACL 1000-0.9 UT/500ML-% IV SOLN
INTRAVENOUS | Status: AC
  Filled 2021-11-23: qty 500

## 2021-11-23 MED ORDER — IOHEXOL 350 MG/ML SOLN
INTRAVENOUS | Status: DC | PRN
  Administered 2021-11-23: 30 mL

## 2021-11-23 MED ORDER — HEPARIN SODIUM (PORCINE) 1000 UNIT/ML IJ SOLN
INTRAMUSCULAR | Status: DC | PRN
  Administered 2021-11-23: 6000 [IU] via INTRAVENOUS

## 2021-11-23 MED ORDER — SODIUM CHLORIDE 0.9% FLUSH
3.0000 mL | Freq: Two times a day (BID) | INTRAVENOUS | Status: DC
  Administered 2021-11-23 – 2021-11-25 (×3): 3 mL via INTRAVENOUS

## 2021-11-23 MED ORDER — MIDAZOLAM HCL 2 MG/2ML IJ SOLN
INTRAMUSCULAR | Status: DC | PRN
  Administered 2021-11-23 (×2): 1 mg via INTRAVENOUS

## 2021-11-23 MED ORDER — SODIUM CHLORIDE 0.9 % IV SOLN: 250.0000 mL | INTRAVENOUS | Status: DC | PRN

## 2021-11-23 MED ORDER — ASPIRIN 81 MG PO CHEW
81.0000 mg | CHEWABLE_TABLET | ORAL | Status: AC
  Administered 2021-11-23: 81 mg via ORAL
  Filled 2021-11-23: qty 1

## 2021-11-23 MED ORDER — VERAPAMIL HCL 2.5 MG/ML IV SOLN
INTRAVENOUS | Status: AC
  Filled 2021-11-23: qty 2

## 2021-11-23 MED ORDER — FENTANYL CITRATE (PF) 100 MCG/2ML IJ SOLN
INTRAMUSCULAR | Status: DC | PRN
  Administered 2021-11-23 (×2): 25 ug via INTRAVENOUS

## 2021-11-23 MED ORDER — APIXABAN 5 MG PO TABS
5.0000 mg | ORAL_TABLET | Freq: Two times a day (BID) | ORAL | Status: DC
  Administered 2021-11-23 – 2021-11-25 (×4): 5 mg via ORAL
  Filled 2021-11-23 (×4): qty 1

## 2021-11-23 MED ORDER — MIDAZOLAM HCL 2 MG/2ML IJ SOLN
INTRAMUSCULAR | Status: AC
  Filled 2021-11-23: qty 2

## 2021-11-23 MED ORDER — NITROGLYCERIN IN D5W 200-5 MCG/ML-% IV SOLN
INTRAVENOUS | Status: AC
  Filled 2021-11-23: qty 250

## 2021-11-23 MED ORDER — LIDOCAINE HCL (PF) 1 % IJ SOLN
INTRAMUSCULAR | Status: AC
  Filled 2021-11-23: qty 30

## 2021-11-23 MED ORDER — SODIUM CHLORIDE 0.9 % IV SOLN: INTRAVENOUS | Status: DC

## 2021-11-23 SURGICAL SUPPLY — 13 items
BAND CMPR LRG ZPHR (HEMOSTASIS) ×1
BAND ZEPHYR COMPRESS 30 LONG (HEMOSTASIS) ×1 IMPLANT
CATH BALLN WEDGE 5F 110CM (CATHETERS) ×1 IMPLANT
CATH DIAG 6FR JR4 (CATHETERS) ×1 IMPLANT
CATH INFINITI 6F FL3.5 (CATHETERS) ×1 IMPLANT
GLIDESHEATH SLEND SS 6F .021 (SHEATH) ×1 IMPLANT
GUIDEWIRE INQWIRE 1.5J.035X260 (WIRE) IMPLANT
INQWIRE 1.5J .035X260CM (WIRE) ×2
KIT HEART LEFT (KITS) ×4 IMPLANT
PACK CARDIAC CATHETERIZATION (CUSTOM PROCEDURE TRAY) ×4 IMPLANT
SHEATH GLIDE SLENDER 4/5FR (SHEATH) ×1 IMPLANT
TRANSDUCER W/STOPCOCK (MISCELLANEOUS) ×4 IMPLANT
TUBING CIL FLEX 10 FLL-RA (TUBING) ×4 IMPLANT

## 2021-11-23 NOTE — Progress Notes (Signed)
ANTICOAGULATION CONSULT NOTE - Follow-up Consult ? ?Pharmacy Consult for heparin ?Indication: atrial fibrillation ? ?No Known Allergies ? ?Patient Measurements: ?Height: _0  (170.2 cm) ?Weight: 126.9 kg (279 lb 12.2 oz) ?IBW/kg (Calculated) : 61.6 ?Heparin Dosing Weight:92kg ? ?Vital Signs: ?Temp: 98 ?F (36.7 ?C) (03/27 1683) ?Temp Source: Oral (03/27 7290) ?BP: 144/89 (03/27 1003) ?Pulse Rate: 82 (03/27 1003) ? ?Labs: ?Recent Labs  ?  11/21/21 ?0001 11/21/21 ?0946 11/21/21 ?1916 11/22/21 ?2111 11/23/21 ?0308 11/23/21 ?5520 11/23/21 ?8022  ?HGB 10.3*  --   --  10.6* 10.8* 11.6* 11.2*  ?HCT 31.6*  --   --  33.0* 33.2* 34.0* 33.0*  ?PLT 268  --   --  219 238  --   --   ?HEPARINUNFRC 0.33   < > 0.53 0.56 0.49  --   --   ?CREATININE 1.98*  --   --  1.85* 1.54*  --   --   ? < > = values in this interval not displayed.  ? ? ? ?Estimated Creatinine Clearance: 41.7 mL/min (A) (by C-G formula based on SCr of 1.54 mg/dL (H)). ? ? ?Medical History: ?Past Medical History:  ?Diagnosis Date  ? 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 Baker's cyst of knee, left 02/12/2020  ? Breast cancer ([place])   ? Cancer [facility])   ? left breast cancer  ? Chronic pain of left knee 02/12/2020  ? Dyspnea   ? Dysrhythmia   ? PACs and nonsustained runs of atrial tach by Holter 10/13/16  ? Family history of breast cancer   ? Family history of colon cancer   ? Family history of melanoma   ? Family history of pancreatic cancer   ? Genetic testing 09/15/2017  ? The patient had genetic testing due to a personal history of breast cancer and skin cancer,  and a family history of breast cancer, pancreatic cancer, melanoma, and colon cancer.  The Common Hereditary Cancer Panel + Melanoma Panel + Basal Cell Nevus Syndrome Panel was ordered (58 genes).  The following genes were evaluated for sequence changes and exonic deletions/duplications: APC, ATM, AXIN2, B  ? History of kidney stones   ? History of left breast cancer 12/06/2017  ? Hyperlipidemia  04/11/2019  ? Hypertension   ? Malignant neoplasm of upper-inner quadrant of left breast in female, estrogen receptor negative ([place]) 06/21/2017  ? Palpitation 02/01/2016  ? PAT (paroxysmal atrial tachycardia) ([place]) 10/30/2016  ? Personal history of colonic polyps   ? Personal history of skin cancer   ? Port-A-Cath in place 07/01/2017  ? SVT (supraventricular tachycardia) ([place]) 05/31/2018  ? ?Assessment: ?76 year old female with history of afib. Notes state she was prescribed apixaban but she couldn't afford it and was not taking it on admit. Planning cath but patient with aki so this is being postponed until next week. Will start heparin.  ? ?Heparin level this AM is 0.49 and is at goal on 1350 units/h. Hgb and platelets remaining stable. No signs of bleeding or IV site issues reported. ? ?Pt is now s/p cath with widely patent coronary arteries.   Remains in afib. ? ?Goal of Therapy:  ?Heparin level 0.3-0.7 units/ml ?Monitor platelets by anticoagulation protocol: Yes ?  ?Plan:  ?Discussed with Dr. Burt Knack - Eliquis copay is $47 which the pt says she can afford.  Will resume Eliquis 5 mg BID with tonight's dose. ? ?[place] Crane, Pharm D, BCPS, BCCP ?Clinical Pharmacist ? 11/23/2021 11:07 AM  ? ?[facility] pharmacy phone  numbers are listed on [email or web address] ? ? ? ?

## 2021-11-23 NOTE — Progress Notes (Signed)
? ?Progress Note ? ?Patient Name: Lindsay [name] ?Date of Encounter: 11/23/2021 ? ?[facility] Cardiologist: Shirlee More, MD  ? ?Subjective  ? ?Feels well, no chest pain and no SOB post cath   ? ?Inpatient Medications  ?  ?Scheduled Meds: ? apixaban  5 mg Oral BID  ? atorvastatin  10 mg Oral QHS  ? docusate sodium  100 mg Oral BID  ? hydrALAZINE  25 mg Oral Q8H  ? magnesium oxide  400 mg Oral Daily  ? metoprolol tartrate  25 mg Oral BID  ? montelukast  10 mg Oral Daily  ? omega-3 acid ethyl esters  1,000 mg Oral Daily  ? sodium chloride flush  3 mL Intravenous Q12H  ? sodium chloride flush  3 mL Intravenous Q12H  ? sodium chloride flush  3 mL Intravenous Q12H  ? ?Continuous Infusions: ? sodium chloride    ? sodium chloride    ? ?PRN Meds: ?sodium chloride, sodium chloride, acetaminophen, guaiFENesin-dextromethorphan, hydrALAZINE, ipratropium-albuterol, labetalol, ondansetron (ZOFRAN) IV, polyethylene glycol, sodium chloride flush, sodium chloride flush  ? ?Vital Signs  ?  ?Vitals:  ? 11/23/21 0903 11/23/21 0934 11/23/21 0948 11/23/21 1003  ?BP: (!) 164/70 (!) 149/79 103/66 (!) 144/89  ?Pulse: 74 83 85 82  ?Resp: 16     ?Temp: 98 ?F (36.7 ?C)     ?TempSrc: Oral     ?SpO2: 91% 92% 92% 90%  ?Weight:      ?Height:      ? ? ?Intake/Output Summary (Last 24 hours) at 11/23/2021 1251 ?Last data filed at 11/23/2021 0455 ?Gross per 24 hour  ?Intake 1010.57 ml  ?Output 300 ml  ?Net 710.57 ml  ? ? ? ?  11/22/2021  ?  5:21 AM 11/21/2021  ?  4:19 AM 11/20/2021  ?  6:20 AM  ?Last 3 Weights  ?Weight (lbs) 279 lb 12.2 oz 278 lb 10.6 oz 275 lb 6.4 oz  ?Weight (kg) 126.9 kg 126.4 kg 124.921 kg  ?   ? ?Telemetry  ?  ?15 beats of SVT yesterday otherwise atrial fib.   - Personally Reviewed ? ?ECG  ?  ?No new from 11/20/21 - Personally Reviewed ? ?Physical Exam  ? ?GEN: No acute distress.   ?Neck: No JVD ?Cardiac: irreg irreg, + systolic murmur, no rubs, or gallops. Rt wrist with band in place. ?Respiratory: Clear to auscultation  bilaterally. ?GI: Soft, nontender, non-distended  ?MS: No edema; No deformity. ?Neuro:  Nonfocal  ?Psych: Normal affect  ? ?Labs  ?  ?High Sensitivity Troponin:   ?Recent Labs  ?Lab 11/17/21 ?1556 11/17/21 ?2225  ?TROPONINIHS 23* 29*  ? ?   ?Chemistry ?Recent Labs  ?Lab 11/21/21 ?0001 11/22/21 ?1007 11/23/21 ?0308 11/23/21 ?1219 11/23/21 ?7588  ?NA 136 138 137 141 141  ?K 3.8 3.8 3.6 3.8 3.8  ?CL 103 104 108  --   --   ?CO2 25 27 25   --   --   ?GLUCOSE 90 92 98  --   --   ?BUN 31* 29* 21  --   --   ?CREATININE 1.98* 1.85* 1.54*  --   --   ?CALCIUM 8.7* 8.8* 8.9  --   --   ?GFRNONAA 25* 28* 34*  --   --   ?ANIONGAP 8 7 4*  --   --   ? ?  ?Lipids No results for input(s): CHOL, TRIG, HDL, LABVLDL, LDLCALC, CHOLHDL in the last 168 hours.  ?Hematology ?Recent Labs  ?Lab 11/21/21 ?0001  11/22/21 ?9509 11/23/21 ?0308 11/23/21 ?3267 11/23/21 ?1245  ?WBC 9.1 7.9 7.6  --   --   ?RBC 3.44* 3.53* 3.59*  --   --   ?HGB 10.3* 10.6* 10.8* 11.6* 11.2*  ?HCT 31.6* 33.0* 33.2* 34.0* 33.0*  ?MCV 91.9 93.5 92.5  --   --   ?MCH 29.9 30.0 30.1  --   --   ?MCHC 32.6 32.1 32.5  --   --   ?RDW 16.3* 16.0* 16.0*  --   --   ?PLT 268 219 238  --   --   ? ? ?Thyroid No results for input(s): TSH, FREET4 in the last 168 hours.  ?BNP ?Recent Labs  ?Lab 11/17/21 ?1556  ?BNP 765.5*  ? ?  ?DDimer No results for input(s): DDIMER in the last 168 hours.  ? ?Radiology  ?  ?CARDIAC CATHETERIZATION ? ?Result Date: 11/23/2021 ?  Hemodynamic findings consistent with mild pulmonary hypertension. 1.  Known severe aortic stenosis by noninvasive assessment 2.  Widely patent coronary arteries with minimal irregularity, no significant coronary stenoses 3.  Elevated pulmonary capillary wedge pressure consistent with congestive heart failure/high filling pressures 4.  Mild pulmonary hypertension with PA pressure 54/23 mean 34 mmHg, transpulmonary gradient 6 mmHg, PVR less than 1 Woods unit Recommend: Continue diuresis (lasix 40 mg IV ordered post-cath x 1) Continue  TAVR evaluation Resume heparin post-procedure, transition to apixaban pending case manager consult (see notes patient unable to afford apixaban pre-hospital)   ? ?Cardiac Studies  ?Cardiac cath 11/23/21 ?  Hemodynamic findings consistent with mild pulmonary hypertension. ?  ?1.  Known severe aortic stenosis by noninvasive assessment ?2.  Widely patent coronary arteries with minimal irregularity, no significant coronary stenoses ?3.  Elevated pulmonary capillary wedge pressure consistent with congestive heart failure/high filling pressures ?4.  Mild pulmonary hypertension with PA pressure 54/23 mean 34 mmHg, transpulmonary gradient 6 mmHg, PVR less than 1 Woods unit ?  ?Recommend:  ?Continue diuresis (lasix 40 mg IV ordered post-cath x 1) ?Continue TAVR evaluation ?Resume heparin post-procedure, transition to apixaban pending case manager consult (see notes patient unable to afford apixaban pre-hospital) ?  ? ? ?Echo 11/18/21 ? ?IMPRESSIONS  ? ? ? 1. Moderate to severe aortic stenosis is present. Vmax 3.6 m/s, MG 29  ?mmHG, AVA 0.69cm2, DI 0.20. Findings may represent paradoxical low flow  ?low gradient aortic stenosis due to low SV index (22 cc/m2). Would  ?recommend an aortic valve calcium score or  ?TEE for clarification. The aortic valve is tricuspid. There is severe  ?calcifcation of the aortic valve. There is severe thickening of the aortic  ?valve. Aortic valve regurgitation is not visualized. Moderate to severe  ?aortic valve stenosis.  ? 2. Left ventricular ejection fraction, by estimation, is 55 to 60%. The  ?left ventricle has normal function. The left ventricle has no regional  ?wall motion abnormalities. There is mild concentric left ventricular  ?hypertrophy. Left ventricular diastolic  ?function could not be evaluated.  ? 3. Right ventricular systolic function is normal. The right ventricular  ?size is normal. There is normal pulmonary artery systolic pressure.  ? 4. Left atrial size was mildly dilated.   ? 5. Right atrial size was mildly dilated.  ? 6. The mitral valve is degenerative. Mild to moderate mitral valve  ?regurgitation.  ? 7. The inferior vena cava is normal in size with greater than 50%  ?respiratory variability, suggesting right atrial pressure of 3 mmHg.  ? ?Comparison(s): No significant change from prior  study.  ? ?FINDINGS  ? Left Ventricle: Left ventricular ejection fraction, by estimation, is 55  ?to 60%. The left ventricle has normal function. The left ventricle has no  ?regional wall motion abnormalities. The left ventricular internal cavity  ?size was normal in size. There is  ? mild concentric left ventricular hypertrophy. Left ventricular diastolic  ?function could not be evaluated due to atrial fibrillation. Left  ?ventricular diastolic function could not be evaluated.  ? ?Right Ventricle: The right ventricular size is normal. No increase in  ?right ventricular wall thickness. Right ventricular systolic function is  ?normal. There is normal pulmonary artery systolic pressure. The tricuspid  ?regurgitant velocity is 2.82 m/s, and  ? with an assumed right atrial pressure of 3 mmHg, the estimated right  ?ventricular systolic pressure is 12.4 mmHg.  ? ?Left Atrium: Left atrial size was mildly dilated.  ? ?Right Atrium: Right atrial size was mildly dilated.  ? ?Pericardium: Trivial pericardial effusion is present.  ? ?Mitral Valve: The mitral valve is degenerative in appearance. Mild to  ?moderate mitral annular calcification. Mild to moderate mitral valve  ?regurgitation.  ? ?Tricuspid Valve: The tricuspid valve is grossly normal. Tricuspid valve  ?regurgitation is mild . No evidence of tricuspid stenosis.  ? ?Aortic Valve: Moderate to severe aortic stenosis is present. Vmax 3.6 m/s,  ?MG 29 mmHG, AVA 0.69cm2, DI 0.20. Findings may represent paradoxical low  ?flow low gradient aortic stenosis due to low SV index (22 cc/m2). Would  ?recommend an aortic valve calcium  ?score or TEE for  clarification. The aortic valve is tricuspid. There is  ?severe calcifcation of the aortic valve. There is severe thickening of the  ?aortic valve. Aortic valve regurgitation is not visualized. Moderate to  ?severe aortic st

## 2021-11-23 NOTE — Progress Notes (Addendum)
? ?Progress Note ? ?Patient Name: Lindsay [name] ?Date of Encounter: 11/23/2021 ? ?[facility] Cardiologist: Shirlee More, MD  ? ?Subjective  ? ?Feels well, no chest pain and no SOB post cath   ? ?Inpatient Medications  ?  ?Scheduled Meds: ? atorvastatin  10 mg Oral QHS  ? docusate sodium  100 mg Oral BID  ? furosemide  40 mg Intravenous Once  ? hydrALAZINE  25 mg Oral Q8H  ? magnesium oxide  400 mg Oral Daily  ? metoprolol tartrate  25 mg Oral BID  ? montelukast  10 mg Oral Daily  ? omega-3 acid ethyl esters  1,000 mg Oral Daily  ? sodium chloride flush  3 mL Intravenous Q12H  ? sodium chloride flush  3 mL Intravenous Q12H  ? sodium chloride flush  3 mL Intravenous Q12H  ? ?Continuous Infusions: ? sodium chloride    ? sodium chloride    ? heparin 1,350 Units/hr (11/23/21 0054)  ? ?PRN Meds: ?sodium chloride, sodium chloride, acetaminophen, guaiFENesin-dextromethorphan, hydrALAZINE, ipratropium-albuterol, labetalol, ondansetron (ZOFRAN) IV, polyethylene glycol, sodium chloride flush, sodium chloride flush  ? ?Vital Signs  ?  ?Vitals:  ? 11/23/21 0458 11/23/21 1017 11/23/21 0745 11/23/21 0903  ?BP: 138/76 (!) 145/67  (!) 164/70  ?Pulse: 76   74  ?Resp:    16  ?Temp: 98.2 ?F (36.8 ?C)   98 ?F (36.7 ?C)  ?TempSrc: Oral   Oral  ?SpO2: 97%  98% 91%  ?Weight:      ?Height:      ? ? ?Intake/Output Summary (Last 24 hours) at 11/23/2021 0915 ?Last data filed at 11/23/2021 0455 ?Gross per 24 hour  ?Intake 1010.57 ml  ?Output 300 ml  ?Net 710.57 ml  ? ? ?  11/22/2021  ?  5:21 AM 11/21/2021  ?  4:19 AM 11/20/2021  ?  6:20 AM  ?Last 3 Weights  ?Weight (lbs) 279 lb 12.2 oz 278 lb 10.6 oz 275 lb 6.4 oz  ?Weight (kg) 126.9 kg 126.4 kg 124.921 kg  ?   ? ?Telemetry  ?  ?15 beats of SVT yesterday otherwise atrial fib.   - Personally Reviewed ? ?ECG  ?  ?No new from 11/20/21 - Personally Reviewed ? ?Physical Exam  ? ?GEN: No acute distress.   ?Neck: No JVD ?Cardiac: irreg irreg, + systolic murmur, no rubs, or gallops. Rt wrist with band in  place. ?Respiratory: Clear to auscultation bilaterally. ?GI: Soft, nontender, non-distended  ?MS: No edema; No deformity. ?Neuro:  Nonfocal  ?Psych: Normal affect  ? ?Labs  ?  ?High Sensitivity Troponin:   ?Recent Labs  ?Lab 11/17/21 ?1556 11/17/21 ?2225  ?TROPONINIHS 23* 29*  ?   ?Chemistry ?Recent Labs  ?Lab 11/21/21 ?0001 11/22/21 ?5102 11/23/21 ?0308 11/23/21 ?5852 11/23/21 ?7782  ?NA 136 138 137 141 141  ?K 3.8 3.8 3.6 3.8 3.8  ?CL 103 104 108  --   --   ?CO2 25 27 25   --   --   ?GLUCOSE 90 92 98  --   --   ?BUN 31* 29* 21  --   --   ?CREATININE 1.98* 1.85* 1.54*  --   --   ?CALCIUM 8.7* 8.8* 8.9  --   --   ?GFRNONAA 25* 28* 34*  --   --   ?ANIONGAP 8 7 4*  --   --   ?  ?Lipids No results for input(s): CHOL, TRIG, HDL, LABVLDL, LDLCALC, CHOLHDL in the last 168 hours.  ?Hematology ?  Recent Labs  ?Lab 11/21/21 ?0001 11/22/21 ?8338 11/23/21 ?0308 11/23/21 ?2505 11/23/21 ?3976  ?WBC 9.1 7.9 7.6  --   --   ?RBC 3.44* 3.53* 3.59*  --   --   ?HGB 10.3* 10.6* 10.8* 11.6* 11.2*  ?HCT 31.6* 33.0* 33.2* 34.0* 33.0*  ?MCV 91.9 93.5 92.5  --   --   ?MCH 29.9 30.0 30.1  --   --   ?MCHC 32.6 32.1 32.5  --   --   ?RDW 16.3* 16.0* 16.0*  --   --   ?PLT 268 219 238  --   --   ? ?Thyroid No results for input(s): TSH, FREET4 in the last 168 hours.  ?BNP ?Recent Labs  ?Lab 11/17/21 ?1556  ?BNP 765.5*  ?  ?DDimer No results for input(s): DDIMER in the last 168 hours.  ? ?Radiology  ?  ?CARDIAC CATHETERIZATION ? ?Result Date: 11/23/2021 ?  Hemodynamic findings consistent with mild pulmonary hypertension. 1.  Known severe aortic stenosis by noninvasive assessment 2.  Widely patent coronary arteries with minimal irregularity, no significant coronary stenoses 3.  Elevated pulmonary capillary wedge pressure consistent with congestive heart failure/high filling pressures 4.  Mild pulmonary hypertension with PA pressure 54/23 mean 34 mmHg, transpulmonary gradient 6 mmHg, PVR less than 1 Woods unit Recommend: Continue diuresis (lasix 40 mg IV  ordered post-cath x 1) Continue TAVR evaluation Resume heparin post-procedure, transition to apixaban pending case manager consult (see notes patient unable to afford apixaban pre-hospital)  ? ?DG CHEST PORT 1 VIEW ? ?Result Date: 11/21/2021 ?CLINICAL DATA:  Aortic stenosis.  Shortness of breath. EXAM: PORTABLE CHEST 1 VIEW COMPARISON:  11/17/2021 and older exams. FINDINGS: Cardiac silhouette is mildly enlarged. No mediastinal or hilar masses. Lungs are hyperexpanded. Prominent interstitial markings most evident in the bases. Mild additional linear/reticular lung base opacities are noted consistent with atelectasis and/or scarring. No evidence of pneumonia or pulmonary edema. No convincing pleural effusion and no pneumothorax. Skeletal structures are grossly intact. IMPRESSION: 1. No acute cardiopulmonary disease. 2. COPD.  Mild cardiomegaly. Electronically Signed   By: Lajean Manes M.D.   On: 11/21/2021 12:03   ? ?Cardiac Studies  ?Cardiac cath 11/23/21 ?  Hemodynamic findings consistent with mild pulmonary hypertension. ?  ?1.  Known severe aortic stenosis by noninvasive assessment ?2.  Widely patent coronary arteries with minimal irregularity, no significant coronary stenoses ?3.  Elevated pulmonary capillary wedge pressure consistent with congestive heart failure/high filling pressures ?4.  Mild pulmonary hypertension with PA pressure 54/23 mean 34 mmHg, transpulmonary gradient 6 mmHg, PVR less than 1 Woods unit ?  ?Recommend:  ?Continue diuresis (lasix 40 mg IV ordered post-cath x 1) ?Continue TAVR evaluation ?Resume heparin post-procedure, transition to apixaban pending case manager consult (see notes patient unable to afford apixaban pre-hospital) ?  ? ? ?Echo 11/18/21 ? ?IMPRESSIONS  ? ? ? 1. Moderate to severe aortic stenosis is present. Vmax 3.6 m/s, MG 29  ?mmHG, AVA 0.69cm2, DI 0.20. Findings may represent paradoxical low flow  ?low gradient aortic stenosis due to low SV index (22 cc/m2). Would  ?recommend  an aortic valve calcium score or  ?TEE for clarification. The aortic valve is tricuspid. There is severe  ?calcifcation of the aortic valve. There is severe thickening of the aortic  ?valve. Aortic valve regurgitation is not visualized. Moderate to severe  ?aortic valve stenosis.  ? 2. Left ventricular ejection fraction, by estimation, is 55 to 60%. The  ?left ventricle has normal function. The left  ventricle has no regional  ?wall motion abnormalities. There is mild concentric left ventricular  ?hypertrophy. Left ventricular diastolic  ?function could not be evaluated.  ? 3. Right ventricular systolic function is normal. The right ventricular  ?size is normal. There is normal pulmonary artery systolic pressure.  ? 4. Left atrial size was mildly dilated.  ? 5. Right atrial size was mildly dilated.  ? 6. The mitral valve is degenerative. Mild to moderate mitral valve  ?regurgitation.  ? 7. The inferior vena cava is normal in size with greater than 50%  ?respiratory variability, suggesting right atrial pressure of 3 mmHg.  ? ?Comparison(s): No significant change from prior study.  ? ?FINDINGS  ? Left Ventricle: Left ventricular ejection fraction, by estimation, is 55  ?to 60%. The left ventricle has normal function. The left ventricle has no  ?regional wall motion abnormalities. The left ventricular internal cavity  ?size was normal in size. There is  ? mild concentric left ventricular hypertrophy. Left ventricular diastolic  ?function could not be evaluated due to atrial fibrillation. Left  ?ventricular diastolic function could not be evaluated.  ? ?Right Ventricle: The right ventricular size is normal. No increase in  ?right ventricular wall thickness. Right ventricular systolic function is  ?normal. There is normal pulmonary artery systolic pressure. The tricuspid  ?regurgitant velocity is 2.82 m/s, and  ? with an assumed right atrial pressure of 3 mmHg, the estimated right  ?ventricular systolic pressure is 46.9  mmHg.  ? ?Left Atrium: Left atrial size was mildly dilated.  ? ?Right Atrium: Right atrial size was mildly dilated.  ? ?Pericardium: Trivial pericardial effusion is present.  ? ?Mitral Valve: The mit

## 2021-11-23 NOTE — Interval H&P Note (Signed)
History and Physical Interval Note: ? ?11/23/2021 ?7:49 AM ? ?Lindsay [name]  has presented today for surgery, with the diagnosis of aortic stenosis.  The various methods of treatment have been discussed with the patient and family. After consideration of risks, benefits and other options for treatment, the patient has consented to  Procedure(s): ?RIGHT/LEFT HEART CATH AND CORONARY ANGIOGRAPHY (N/A) as a surgical intervention.  The patient's history has been reviewed, patient examined, no change in status, stable for surgery.  I have reviewed the patient's chart and labs.  Questions were answered to the patient's satisfaction.   ? ? ?Sherren Mocha ? ? ?

## 2021-11-24 LAB — BASIC METABOLIC PANEL
Anion gap: 8 (ref 5–15)
BUN: 17 mg/dL (ref 8–23)
CO2: 26 mmol/L (ref 22–32)
Calcium: 9.2 mg/dL (ref 8.9–10.3)
Chloride: 107 mmol/L (ref 98–111)
Creatinine, Ser: 1.52 mg/dL — ABNORMAL HIGH (ref 0.44–1.00)
GFR, Estimated: 35 mL/min — ABNORMAL LOW (ref >60)
Glucose, Bld: 94 mg/dL (ref 70–99)
Potassium: 3.7 mmol/L (ref 3.5–5.1)
Sodium: 141 mmol/L (ref 135–145)

## 2021-11-24 LAB — CBC
HCT: 33.3 % — ABNORMAL LOW (ref 36.0–46.0)
Hemoglobin: 10.5 g/dL — ABNORMAL LOW (ref 12.0–15.0)
MCH: 29.1 pg (ref 26.0–34.0)
MCHC: 31.5 g/dL (ref 30.0–36.0)
MCV: 92.2 fL (ref 80.0–100.0)
Platelets: 235 10*3/uL (ref 150–400)
RBC: 3.61 MIL/uL — ABNORMAL LOW (ref 3.87–5.11)
RDW: 16.4 % — ABNORMAL HIGH (ref 11.5–15.5)
WBC: 7.2 10*3/uL (ref 4.0–10.5)
nRBC: 0 % (ref 0.0–0.2)

## 2021-11-24 LAB — MAGNESIUM: Magnesium: 1.9 mg/dL (ref 1.7–2.4)

## 2021-11-24 MED ORDER — POTASSIUM CHLORIDE CRYS ER 20 MEQ PO TBCR
40.0000 meq | EXTENDED_RELEASE_TABLET | Freq: Once | ORAL | Status: AC
  Administered 2021-11-24: 40 meq via ORAL
  Filled 2021-11-24: qty 2

## 2021-11-24 MED ORDER — FUROSEMIDE 10 MG/ML IJ SOLN
40.0000 mg | Freq: Every day | INTRAMUSCULAR | Status: DC
  Administered 2021-11-24: 40 mg via INTRAVENOUS
  Filled 2021-11-24: qty 4

## 2021-11-24 NOTE — Care Management Important Message (Signed)
Important Message ? ?Patient Details  ?Name: Lindsay [name] ?MRN: [id number] ?Date of Birth: 1945-09-09 ? ? ?Medicare Important Message Given:  Yes ? ? ? ? ?Shelda Altes ?11/24/2021, 11:18 AM ?

## 2021-11-24 NOTE — Progress Notes (Signed)
SATURATION QUALIFICATIONS: (This note is used to comply with regulatory documentation for home oxygen)  Patient Saturations on Room Air at Rest = 92%  Patient Saturations on Room Air while Ambulating = 90%   

## 2021-11-24 NOTE — Progress Notes (Signed)
CARDIAC REHAB PHASE I  ? ?PRE:  Rate/Rhythm: 26 afib ? ?  BP: sitting 140/61 ? ?  SaO2: 89-91 RA ? ?MODE:  Ambulation: 50 ft  ? ?POST:  Rate/Rhythm: 126 afib with PVCs ? ?  BP: sitting 129/78  ? ?  SaO2: 87-93 RA ? ?Pt stood independently, walked to BR by furniture support. Checked SaO2 out of BR and 89-91 RA. Walked hall with RW, slow and steady. 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 Return to recliner, SaO2 87 initially, up to 93 RA with rest. Applied oximetry for further monitoring.  ? ?Gave and discussed HF booklet. Pt voiced reception. She declined a RW or rollator at this time, likes using her cane.  ?[id number]  ? ?Yves Dill CES, ACSM ?11/24/2021 ?11:41 AM ? ? ? ? ?

## 2021-11-24 NOTE — Progress Notes (Signed)
Mobility Specialist Progress Note  ? ? 11/24/21 1545  ?Mobility  ?Activity Ambulated with assistance in hallway  ?Level of Assistance Standby assist, set-up cues, supervision of patient - no hands on  ?Assistive Device Front wheel walker  ?Distance Ambulated (ft) 150 ft  ?Activity Response Tolerated well  ?$Mobility charge 1 Mobility  ? ?Pre-Mobility: 84 HR, 92% SpO2 ?During Mobility: 90% SpO2 ?Post-Mobility: 100 HR ? ?Pt received in chair and agreeable. Encouraged pursed lip breathing. Ambulated on RA. No complaints. Returned to chair with call bell in reach and family present.  ? ?Lindsay [name] ?Mobility Specialist  ?  ?

## 2021-11-24 NOTE — Progress Notes (Addendum)
? ?Progress Note ? ?Patient Name: Lindsay [name] ?Date of Encounter: 11/24/2021 ? ?[facility] Cardiologist: Shirlee More, MD  ? ?Subjective  ? ?No chest pain , no SOB no edema feels well.   ? ?Inpatient Medications  ?  ?Scheduled Meds: ? apixaban  5 mg Oral BID  ? atorvastatin  10 mg Oral QHS  ? docusate sodium  100 mg Oral BID  ? hydrALAZINE  25 mg Oral Q8H  ? magnesium oxide  400 mg Oral Daily  ? metoprolol tartrate  25 mg Oral BID  ? montelukast  10 mg Oral Daily  ? omega-3 acid ethyl esters  1,000 mg Oral Daily  ? sodium chloride flush  3 mL Intravenous Q12H  ? sodium chloride flush  3 mL Intravenous Q12H  ? sodium chloride flush  3 mL Intravenous Q12H  ? ?Continuous Infusions: ? sodium chloride    ? sodium chloride    ? ?PRN Meds: ?sodium chloride, sodium chloride, acetaminophen, guaiFENesin-dextromethorphan, ipratropium-albuterol, ondansetron (ZOFRAN) IV, polyethylene glycol, sodium chloride flush, sodium chloride flush  ? ?Vital Signs  ?  ?Vitals:  ? 11/23/21 1349 11/23/21 1405 11/23/21 2025 11/24/21 0528  ?BP: 115/72 133/68 (!) 151/82 (!) 156/66  ?Pulse: 83  89 88  ?Resp: 19     ?Temp: 99.1 ?F (37.3 ?C)  98.1 ?F (36.7 ?C) 98.1 ?F (36.7 ?C)  ?TempSrc: Axillary  Oral Oral  ?SpO2: 95%  93% 94%  ?Weight:    126.1 kg  ?Height:      ? ? ?Intake/Output Summary (Last 24 hours) at 11/24/2021 0809 ?Last data filed at 11/24/2021 0535 ?Gross per 24 hour  ?Intake --  ?Output 1100 ml  ?Net -1100 ml  ? ? ?  11/24/2021  ?  5:28 AM 11/22/2021  ?  5:21 AM 11/21/2021  ?  4:19 AM  ?Last 3 Weights  ?Weight (lbs) 278 lb 1.6 oz 279 lb 12.2 oz 278 lb 10.6 oz  ?Weight (kg) 126.145 kg 126.9 kg 126.4 kg  ?   ? ?Telemetry  ?  ?Atrial fib - Personally Reviewed ? ?ECG  ?  ?No new - Personally Reviewed ?Physical Exam  ? ?GEN: No acute distress.   ?Neck: No JVD ?Cardiac: irreg irreg, + murmurs, no rubs, or gallops.  ?Respiratory: Clear to auscultation bilaterally. ?GI: Soft, nontender, non-distended  ?MS: No edema; No deformity. ?Neuro:   Nonfocal  ?Psych: Normal affect  ? ?Labs  ?  ?High Sensitivity Troponin:   ?Recent Labs  ?Lab 11/17/21 ?1556 11/17/21 ?2225  ?TROPONINIHS 23* 29*  ?   ?Chemistry ?Recent Labs  ?Lab 11/22/21 ?6283 11/23/21 ?0308 11/23/21 ?1517 11/23/21 ?6160 11/24/21 ?7371  ?NA 138 137 141 141 141  ?K 3.8 3.6 3.8 3.8 3.7  ?CL 104 108  --   --  107  ?CO2 27 25  --   --  26  ?GLUCOSE 92 98  --   --  94  ?BUN 29* 21  --   --  17  ?CREATININE 1.85* 1.54*  --   --  1.52*  ?CALCIUM 8.8* 8.9  --   --  9.2  ?MG  --   --   --   --  1.9  ?GFRNONAA 28* 34*  --   --  35*  ?ANIONGAP 7 4*  --   --  8  ?  ?Lipids No results for input(s): CHOL, TRIG, HDL, LABVLDL, LDLCALC, CHOLHDL in the last 168 hours.  ?Hematology ?Recent Labs  ?Lab 11/22/21 ?0626 11/23/21 ?9485  11/23/21 ?1287 11/23/21 ?8676 11/24/21 ?7209  ?WBC 7.9 7.6  --   --  7.2  ?RBC 3.53* 3.59*  --   --  3.61*  ?HGB 10.6* 10.8* 11.6* 11.2* 10.5*  ?HCT 33.0* 33.2* 34.0* 33.0* 33.3*  ?MCV 93.5 92.5  --   --  92.2  ?MCH 30.0 30.1  --   --  29.1  ?MCHC 32.1 32.5  --   --  31.5  ?RDW 16.0* 16.0*  --   --  16.4*  ?PLT 219 238  --   --  235  ? ?Thyroid No results for input(s): TSH, FREET4 in the last 168 hours.  ?BNP ?Recent Labs  ?Lab 11/17/21 ?1556  ?BNP 765.5*  ?  ?DDimer No results for input(s): DDIMER in the last 168 hours.  ? ?Radiology  ?  ?CARDIAC CATHETERIZATION ? ?Result Date: 11/23/2021 ?  Hemodynamic findings consistent with mild pulmonary hypertension. 1.  Known severe aortic stenosis by noninvasive assessment 2.  Widely patent coronary arteries with minimal irregularity, no significant coronary stenoses 3.  Elevated pulmonary capillary wedge pressure consistent with congestive heart failure/high filling pressures 4.  Mild pulmonary hypertension with PA pressure 54/23 mean 34 mmHg, transpulmonary gradient 6 mmHg, PVR less than 1 Woods unit Recommend: Continue diuresis (lasix 40 mg IV ordered post-cath x 1) Continue TAVR evaluation Resume heparin post-procedure, transition to apixaban  pending case manager consult (see notes patient unable to afford apixaban pre-hospital)   ? ?Cardiac Studies  ? ?Cardiac cath 11/23/21 ?  Hemodynamic findings consistent with mild pulmonary hypertension. ?  ?1.  Known severe aortic stenosis by noninvasive assessment ?2.  Widely patent coronary arteries with minimal irregularity, no significant coronary stenoses ?3.  Elevated pulmonary capillary wedge pressure consistent with congestive heart failure/high filling pressures ?4.  Mild pulmonary hypertension with PA pressure 54/23 mean 34 mmHg, transpulmonary gradient 6 mmHg, PVR less than 1 Woods unit ?  ?Recommend:  ?Continue diuresis (lasix 40 mg IV ordered post-cath x 1) ?Continue TAVR evaluation ?Resume heparin post-procedure, transition to apixaban pending case manager consult (see notes patient unable to afford apixaban pre-hospital) ?  ?  ?  ?Echo 11/18/21 ? ?IMPRESSIONS  ? ? ? 1. Moderate to severe aortic stenosis is present. Vmax 3.6 m/s, MG 29  ?mmHG, AVA 0.69cm2, DI 0.20. Findings may represent paradoxical low flow  ?low gradient aortic stenosis due to low SV index (22 cc/m2). Would  ?recommend an aortic valve calcium score or  ?TEE for clarification. The aortic valve is tricuspid. There is severe  ?calcifcation of the aortic valve. There is severe thickening of the aortic  ?valve. Aortic valve regurgitation is not visualized. Moderate to severe  ?aortic valve stenosis.  ? 2. Left ventricular ejection fraction, by estimation, is 55 to 60%. The  ?left ventricle has normal function. The left ventricle has no regional  ?wall motion abnormalities. There is mild concentric left ventricular  ?hypertrophy. Left ventricular diastolic  ?function could not be evaluated.  ? 3. Right ventricular systolic function is normal. The right ventricular  ?size is normal. There is normal pulmonary artery systolic pressure.  ? 4. Left atrial size was mildly dilated.  ? 5. Right atrial size was mildly dilated.  ? 6. The mitral valve  is degenerative. Mild to moderate mitral valve  ?regurgitation.  ? 7. The inferior vena cava is normal in size with greater than 50%  ?respiratory variability, suggesting right atrial pressure of 3 mmHg.  ? ?Comparison(s): No significant change from prior study.  ? ?  FINDINGS  ? Left Ventricle: Left ventricular ejection fraction, by estimation, is 55  ?to 60%. The left ventricle has normal function. The left ventricle has no  ?regional wall motion abnormalities. The left ventricular internal cavity  ?size was normal in size. There is  ? mild concentric left ventricular hypertrophy. Left ventricular diastolic  ?function could not be evaluated due to atrial fibrillation. Left  ?ventricular diastolic function could not be evaluated.  ? ?Right Ventricle: The right ventricular size is normal. No increase in  ?right ventricular wall thickness. Right ventricular systolic function is  ?normal. There is normal pulmonary artery systolic pressure. The tricuspid  ?regurgitant velocity is 2.82 m/s, and  ? with an assumed right atrial pressure of 3 mmHg, the estimated right  ?ventricular systolic pressure is 92.4 mmHg.  ? ?Left Atrium: Left atrial size was mildly dilated.  ? ?Right Atrium: Right atrial size was mildly dilated.  ? ?Pericardium: Trivial pericardial effusion is present.  ? ?Mitral Valve: The mitral valve is degenerative in appearance. Mild to  ?moderate mitral annular calcification. Mild to moderate mitral valve  ?regurgitation.  ? ?Tricuspid Valve: The tricuspid valve is grossly normal. Tricuspid valve  ?regurgitation is mild . No evidence of tricuspid stenosis.  ? ?Aortic Valve: Moderate to severe aortic stenosis is present. Vmax 3.6 m/s,  ?MG 29 mmHG, AVA 0.69cm2, DI 0.20. Findings may represent paradoxical low  ?flow low gradient aortic stenosis due to low SV index (22 cc/m2). Would  ?recommend an aortic valve calcium  ?score or TEE for clarification. The aortic valve is tricuspid. There is  ?severe calcifcation  of the aortic valve. There is severe thickening of the  ?aortic valve. Aortic valve regurgitation is not visualized. Moderate to  ?severe aortic stenosis is present.  ?Aortic valve mean gradient measures

## 2021-11-25 ENCOUNTER — Other Ambulatory Visit (HOSPITAL_COMMUNITY): Payer: Self-pay

## 2021-11-25 ENCOUNTER — Encounter: Payer: Self-pay | Admitting: Hematology

## 2021-11-25 ENCOUNTER — Other Ambulatory Visit: Payer: Self-pay | Admitting: Physician Assistant

## 2021-11-25 DIAGNOSIS — N170 Acute kidney failure with tubular necrosis: Secondary | ICD-10-CM

## 2021-11-25 LAB — CBC
HCT: 35.9 % — ABNORMAL LOW (ref 36.0–46.0)
Hemoglobin: 11.4 g/dL — ABNORMAL LOW (ref 12.0–15.0)
MCH: 29.4 pg (ref 26.0–34.0)
MCHC: 31.8 g/dL (ref 30.0–36.0)
MCV: 92.5 fL (ref 80.0–100.0)
Platelets: 236 10*3/uL (ref 150–400)
RBC: 3.88 MIL/uL (ref 3.87–5.11)
RDW: 16.5 % — ABNORMAL HIGH (ref 11.5–15.5)
WBC: 7.9 10*3/uL (ref 4.0–10.5)
nRBC: 0 % (ref 0.0–0.2)

## 2021-11-25 LAB — BASIC METABOLIC PANEL
Anion gap: 9 (ref 5–15)
BUN: 17 mg/dL (ref 8–23)
CO2: 25 mmol/L (ref 22–32)
Calcium: 9.5 mg/dL (ref 8.9–10.3)
Chloride: 105 mmol/L (ref 98–111)
Creatinine, Ser: 1.64 mg/dL — ABNORMAL HIGH (ref 0.44–1.00)
GFR, Estimated: 32 mL/min — ABNORMAL LOW (ref >60)
Glucose, Bld: 96 mg/dL (ref 70–99)
Potassium: 3.9 mmol/L (ref 3.5–5.1)
Sodium: 139 mmol/L (ref 135–145)

## 2021-11-25 LAB — MAGNESIUM: Magnesium: 1.9 mg/dL (ref 1.7–2.4)

## 2021-11-25 MED ORDER — METOPROLOL TARTRATE 25 MG PO TABS
25.0000 mg | ORAL_TABLET | Freq: Two times a day (BID) | ORAL | 3 refills | Status: DC
  Filled 2021-11-25: qty 60, 30d supply, fill #0

## 2021-11-25 MED ORDER — POTASSIUM CHLORIDE CRYS ER 20 MEQ PO TBCR
20.0000 meq | EXTENDED_RELEASE_TABLET | Freq: Once | ORAL | Status: AC
  Administered 2021-11-25: 20 meq via ORAL
  Filled 2021-11-25: qty 1

## 2021-11-25 MED ORDER — FUROSEMIDE 40 MG PO TABS
40.0000 mg | ORAL_TABLET | Freq: Every day | ORAL | 6 refills | Status: DC
  Filled 2021-11-25: qty 30, 30d supply, fill #0

## 2021-11-25 MED ORDER — APIXABAN 5 MG PO TABS
5.0000 mg | ORAL_TABLET | Freq: Two times a day (BID) | ORAL | 6 refills | Status: DC
  Filled 2021-11-25: qty 60, 30d supply, fill #0

## 2021-11-25 MED ORDER — HYDRALAZINE HCL 25 MG PO TABS
37.5000 mg | ORAL_TABLET | Freq: Three times a day (TID) | ORAL | Status: DC
  Administered 2021-11-25: 37.5 mg via ORAL
  Filled 2021-11-25: qty 2

## 2021-11-25 MED ORDER — FUROSEMIDE 40 MG PO TABS
40.0000 mg | ORAL_TABLET | Freq: Every day | ORAL | Status: DC
  Administered 2021-11-25: 40 mg via ORAL
  Filled 2021-11-25: qty 1

## 2021-11-25 NOTE — Progress Notes (Signed)
? ?Progress Note ? ?Patient Name: Lindsay [name] ?Date of Encounter: 11/25/2021 ? ?[facility] Cardiologist: Shirlee More, MD  ? ?Subjective  ? ?I/Os not recorded yesterday.  Mild bump in creatinine (1.52 > 1.64).  BP 125/72 this morning ? ?Inpatient Medications  ?  ?Scheduled Meds: ? apixaban  5 mg Oral BID  ? atorvastatin  10 mg Oral QHS  ? docusate sodium  100 mg Oral BID  ? furosemide  40 mg Oral Daily  ? hydrALAZINE  25 mg Oral Q8H  ? magnesium oxide  400 mg Oral Daily  ? metoprolol tartrate  25 mg Oral BID  ? montelukast  10 mg Oral Daily  ? omega-3 acid ethyl esters  1,000 mg Oral Daily  ? sodium chloride flush  3 mL Intravenous Q12H  ? sodium chloride flush  3 mL Intravenous Q12H  ? sodium chloride flush  3 mL Intravenous Q12H  ? ?Continuous Infusions: ? sodium chloride    ? sodium chloride    ? ?PRN Meds: ?sodium chloride, sodium chloride, acetaminophen, guaiFENesin-dextromethorphan, ipratropium-albuterol, ondansetron (ZOFRAN) IV, polyethylene glycol, sodium chloride flush, sodium chloride flush  ? ?Vital Signs  ?  ?Vitals:  ? 11/25/21 0349 11/25/21 0758 11/25/21 0800 11/25/21 1001  ?BP: (!) 157/67 (!) 152/66 (!) 152/66 125/72  ?Pulse: 89 92 98   ?Resp: 18 16    ?Temp: 98.1 ?F (36.7 ?C) 98.1 ?F (36.7 ?C)    ?TempSrc: Oral Axillary    ?SpO2:  99% 97%   ?Weight:      ?Height:      ? ? ?Intake/Output Summary (Last 24 hours) at 11/25/2021 1140 ?Last data filed at 11/24/2021 1400 ?Gross per 24 hour  ?Intake 240 ml  ?Output --  ?Net 240 ml  ? ? ? ?  11/24/2021  ?  5:28 AM 11/22/2021  ?  5:21 AM 11/21/2021  ?  4:19 AM  ?Last 3 Weights  ?Weight (lbs) 278 lb 1.6 oz 279 lb 12.2 oz 278 lb 10.6 oz  ?Weight (kg) 126.145 kg 126.9 kg 126.4 kg  ?   ? ?Telemetry  ?  ?Atrial fib 80s to 110s- Personally Reviewed ? ?ECG  ?  ?No new - Personally Reviewed ?Physical Exam  ? ?GEN: No acute distress.   ?Neck: No JVD ?Cardiac: irreg, + 3/6 murmur ?Respiratory: Clear to auscultation bilaterally. ?GI: Soft, nontender, non-distended  ?MS:  No edema; No deformity. ?Neuro:  Nonfocal  ?Psych: Normal affect  ? ?Labs  ?  ?High Sensitivity Troponin:   ?Recent Labs  ?Lab 11/17/21 ?1556 11/17/21 ?2225  ?TROPONINIHS 23* 29*  ? ?   ?Chemistry ?Recent Labs  ?Lab 11/23/21 ?0308 11/23/21 ?0813 11/23/21 ?0630 11/24/21 ?1601 11/25/21 ?0345  ?NA 137   < > 141 141 139  ?K 3.6   < > 3.8 3.7 3.9  ?CL 108  --   --  107 105  ?CO2 25  --   --  26 25  ?GLUCOSE 98  --   --  94 96  ?BUN 21  --   --  17 17  ?CREATININE 1.54*  --   --  1.52* 1.64*  ?CALCIUM 8.9  --   --  9.2 9.5  ?MG  --   --   --  1.9 1.9  ?GFRNONAA 34*  --   --  35* 32*  ?ANIONGAP 4*  --   --  8 9  ? < > = values in this interval not displayed.  ? ?  ?Lipids  No results for input(s): CHOL, TRIG, HDL, LABVLDL, LDLCALC, CHOLHDL in the last 168 hours.  ?Hematology ?Recent Labs  ?Lab 11/23/21 ?0308 11/23/21 ?0813 11/23/21 ?0626 11/24/21 ?9485 11/25/21 ?0345  ?WBC 7.6  --   --  7.2 7.9  ?RBC 3.59*  --   --  3.61* 3.88  ?HGB 10.8*   < > 11.2* 10.5* 11.4*  ?HCT 33.2*   < > 33.0* 33.3* 35.9*  ?MCV 92.5  --   --  92.2 92.5  ?MCH 30.1  --   --  29.1 29.4  ?MCHC 32.5  --   --  31.5 31.8  ?RDW 16.0*  --   --  16.4* 16.5*  ?PLT 238  --   --  235 236  ? < > = values in this interval not displayed.  ? ? ?Thyroid No results for input(s): TSH, FREET4 in the last 168 hours.  ?BNP ?No results for input(s): BNP, PROBNP in the last 168 hours. ?  ?DDimer No results for input(s): DDIMER in the last 168 hours.  ? ?Radiology  ?  ?No results found. ? ?Cardiac Studies  ? ?Cardiac cath 11/23/21 ?  Hemodynamic findings consistent with mild pulmonary hypertension. ?  ?1.  Known severe aortic stenosis by noninvasive assessment ?2.  Widely patent coronary arteries with minimal irregularity, no significant coronary stenoses ?3.  Elevated pulmonary capillary wedge pressure consistent with congestive heart failure/high filling pressures ?4.  Mild pulmonary hypertension with PA pressure 54/23 mean 34 mmHg, transpulmonary gradient 6 mmHg, PVR less  than 1 Woods unit ?  ?Recommend:  ?Continue diuresis (lasix 40 mg IV ordered post-cath x 1) ?Continue TAVR evaluation ?Resume heparin post-procedure, transition to apixaban pending case manager consult (see notes patient unable to afford apixaban pre-hospital) ?  ?  ?  ?Echo 11/18/21 ? ?IMPRESSIONS  ? ? ? 1. Moderate to severe aortic stenosis is present. Vmax 3.6 m/s, MG 29  ?mmHG, AVA 0.69cm2, DI 0.20. Findings may represent paradoxical low flow  ?low gradient aortic stenosis due to low SV index (22 cc/m2). Would  ?recommend an aortic valve calcium score or  ?TEE for clarification. The aortic valve is tricuspid. There is severe  ?calcifcation of the aortic valve. There is severe thickening of the aortic  ?valve. Aortic valve regurgitation is not visualized. Moderate to severe  ?aortic valve stenosis.  ? 2. Left ventricular ejection fraction, by estimation, is 55 to 60%. The  ?left ventricle has normal function. The left ventricle has no regional  ?wall motion abnormalities. There is mild concentric left ventricular  ?hypertrophy. Left ventricular diastolic  ?function could not be evaluated.  ? 3. Right ventricular systolic function is normal. The right ventricular  ?size is normal. There is normal pulmonary artery systolic pressure.  ? 4. Left atrial size was mildly dilated.  ? 5. Right atrial size was mildly dilated.  ? 6. The mitral valve is degenerative. Mild to moderate mitral valve  ?regurgitation.  ? 7. The inferior vena cava is normal in size with greater than 50%  ?respiratory variability, suggesting right atrial pressure of 3 mmHg.  ? ?Comparison(s): No significant change from prior study.  ? ?FINDINGS  ? Left Ventricle: Left ventricular ejection fraction, by estimation, is 55  ?to 60%. The left ventricle has normal function. The left ventricle has no  ?regional wall motion abnormalities. The left ventricular internal cavity  ?size was normal in size. There is  ? mild concentric left ventricular  hypertrophy. Left ventricular diastolic  ?function could  not be evaluated due to atrial fibrillation. Left  ?ventricular diastolic function could not be evaluated.  ? ?Right Ventricle: The right ventricular size is normal. No increase in  ?right ventricular wall thickness. Right ventricular systolic function is  ?normal. There is normal pulmonary artery systolic pressure. The tricuspid  ?regurgitant velocity is 2.82 m/s, and  ? with an assumed right atrial pressure of 3 mmHg, the estimated right  ?ventricular systolic pressure is 08.1 mmHg.  ? ?Left Atrium: Left atrial size was mildly dilated.  ? ?Right Atrium: Right atrial size was mildly dilated.  ? ?Pericardium: Trivial pericardial effusion is present.  ? ?Mitral Valve: The mitral valve is degenerative in appearance. Mild to  ?moderate mitral annular calcification. Mild to moderate mitral valve  ?regurgitation.  ? ?Tricuspid Valve: The tricuspid valve is grossly normal. Tricuspid valve  ?regurgitation is mild . No evidence of tricuspid stenosis.  ? ?Aortic Valve: Moderate to severe aortic stenosis is present. Vmax 3.6 m/s,  ?MG 29 mmHG, AVA 0.69cm2, DI 0.20. Findings may represent paradoxical low  ?flow low gradient aortic stenosis due to low SV index (22 cc/m2). Would  ?recommend an aortic valve calcium  ?score or TEE for clarification. The aortic valve is tricuspid. There is  ?severe calcifcation of the aortic valve. There is severe thickening of the  ?aortic valve. Aortic valve regurgitation is not visualized. Moderate to  ?severe aortic stenosis is present.  ?Aortic valve mean gradient measures 29.0 mmHg. Aortic valve peak gradient  ?measures 50.7 mmHg. Aortic valve area, by VTI measures 0.69 cm?.  ? ?Pulmonic Valve: The pulmonic valve was grossly normal. Pulmonic valve  ?regurgitation is mild. No evidence of pulmonic stenosis.  ? ?Aorta: The aortic root and ascending aorta are structurally normal, with  ?no evidence of dilitation.  ? ?Venous: The inferior  vena cava is normal in size with greater than 50%  ?respiratory variability, suggesting right atrial pressure of 3 mmHg.  ? ?IAS/Shunts: The atrial septum is grossly normal.  ? ?   ? ?Patient Profile  ?   ?76 y.o

## 2021-11-25 NOTE — Discharge Instructions (Signed)

## 2021-11-25 NOTE — Discharge Summary (Addendum)
?Discharge Summary  ?  ?Patient ID: Lindsay [name] ?MRN: [id number]; DOB: [birth year] ? ?Admit date: 11/17/2021 ?Discharge date: 11/25/2021 ? ?PCP:  Ronita Hipps, MD ?  ?[facility] Providers ?Cardiologist:  Shirlee More, MD   ? Valve Team: Dr. Ali Lowe ? ?Discharge Diagnoses  ?  ?Principal Problem: ?  Severe aortic stenosis ?Active Problems: ?  Essential hypertension ?  Elevated troponin ?  AKI (acute kidney injury) ([place]) ?  PAF (paroxysmal atrial fibrillation) ([place]) ?  Acute on chronic diastolic heart failure ([place]) ?Acute on chronic kidney disease stage IIIb ? ?Diagnostic Studies/Procedures  ?  ?RIGHT HEART CATH AND CORONARY ANGIOGRAPHY 11/23/21  ? ?Conclusion ?  Hemodynamic findings consistent with mild pulmonary hypertension. ?  ?1.  Known severe aortic stenosis by noninvasive assessment ?2.  Widely patent coronary arteries with minimal irregularity, no significant coronary stenoses ?3.  Elevated pulmonary capillary wedge pressure consistent with congestive heart failure/high filling pressures ?4.  Mild pulmonary hypertension with PA pressure 54/23 mean 34 mmHg, transpulmonary gradient 6 mmHg, PVR less than 1 Woods unit ?  ?Recommend:  ?Continue diuresis (lasix 40 mg IV ordered post-cath x 1) ?Continue TAVR evaluation ?Resume heparin post-procedure, transition to apixaban pending case manager consult (see notes patient unable to afford apixaban pre-hospital) ?  ? Diagnostic ?Dominance: Right ? ?Echo 11/18/21 ?1. Moderate to severe aortic stenosis is present. Vmax 3.6 m/s, MG 29  ?mmHG, AVA 0.69cm2, DI 0.20. Findings may represent paradoxical low flow  ?low gradient aortic stenosis due to low SV index (22 cc/m2). Would  ?recommend an aortic valve calcium score or  ?TEE for clarification. The aortic valve is tricuspid. There is severe  ?calcifcation of the aortic valve. There is severe thickening of the aortic  ?valve. Aortic valve regurgitation is not visualized. Moderate to severe  ?aortic valve stenosis.  ? 2.  Left ventricular ejection fraction, by estimation, is 55 to 60%. The  ?left ventricle has normal function. The left ventricle has no regional  ?wall motion abnormalities. There is mild concentric left ventricular  ?hypertrophy. Left ventricular diastolic  ?function could not be evaluated.  ? 3. Right ventricular systolic function is normal. The right ventricular  ?size is normal. There is normal pulmonary artery systolic pressure.  ? 4. Left atrial size was mildly dilated.  ? 5. Right atrial size was mildly dilated.  ? 6. The mitral valve is degenerative. Mild to moderate mitral valve  ?regurgitation.  ? 7. The inferior vena cava is normal in size with greater than 50%  ?respiratory variability, suggesting right atrial pressure of 3 mmHg.  ? ?Comparison(s): No significant change from prior study.  ? ?History of Present Illness   ?  ?Lindsay [name] is a 76 y.o. female with a hx of essential hypertension, hyperlipidemia, atrial fibrillation on Eliquis, hypercalcemia, breast cancer s/p left breast mastectomy (chemo only/no radiation), CKD stage IIIb, and severe symptomatic aortic stenosis sent from clinic for heart failure exacerbation. ? ?She had an echocardiogram 05/2017 that showed a normal LVEF at 60-65% with no aortic stenosis noted at that time. He was following her mainly for palpitations/PAT and hypertension. By 04/2021, she was seen in follow up at which time a murmur was heard on exam prompting repeat echocardiogram that showed severe low flow aortic stenosis with an LVEF at 55-60%, G2DD, mean gradient at 25.9mmHg, AVA by VTI at 0.72cm2, SVI 20. She was asymptomatic at that time. She was monitored closely in the OP setting and began to have symptoms around February  of this year. TAVR was mentioned however she was rather reluctant to proceed. Her diuretics were adjusted and plan was to see her back in close follow up. On evaluation, 11/05/21 she continued to have HF symptoms and continued to defer valve workup.   ? ?Unfortunately on most recent evaluation with Dr. Bettina Gavia 11/17/21 she was noted to have markedly deteriorated with worsening HF symptoms. Plan was for hospital admission and further workup for aortic stenosis.  ? ?She has been rather functional with ADL/IADLs and housework until several months ago. She was previously able to due her home activities without SOB or taking frequent breaks. More recently she will have to sit and rest with minimal activity. She has been unable to lay flat to sleep and has intermittent leg swelling. She ambulates without assistance in her home and for short distances however may use a cane for longer distances while out running errands ? ?Hospital Course  ?   ?Consultants: Valve Team  ? ?Aortic stenosis: The patient has history of known aortic stenosis.  She developed volume overload and was transferred from [place] up to [facility] for further evaluation and management. ?-Lasix was held due to worsening renal function with diuresis but with IV fluids for cath and AKI she once again became volume overloaded.  Cath was completed with nonobstructive CAD, RA 16, RV 50/5, PA 54/23/34, PW 28, PA sat 70%.  She was diuresed again with improved volume status transition to p.o. Lasix ?-structural team plan for outpt follow up first with nephrology and then CT scans.    ?  ?Acute on chronic diastolic CHF:  Her AS is a significant contributor to her CHF symptoms  see above.  Elevated PCWP on cath consistent with CHF.  Diuresed with IV Lasix >>switch to p.o. Lasix 40 mg daily. Net I & O +447.9L.  ? ?AKI on CKD.  Her Cr in 09/2021 was 1.34  she had bump in Cr on 11/05/21 from 1.34 to 2.46 - Did not find that meds had been adjusted.    Creatinine  bumped to 1.98 this admission then gradually improved. 1.64 at discharge. New baseline 1.5-1.6.  ?-Needs BMET within 1 week of discharge ?  ?Atrial fib on EKG 05/08/21 pt with a fib, she was directed to start eliquis but she could not afford and she did not  believe she needed.  Started heparin drip in anticipation of cath.   ?-Rate controlled on metoprolol 25 mg twice daily ?-now on eliquis ?  ?Hypertension: She is on clonidine 0.1 mg twice daily, acebutolol 200 mg twice daily, irbesartan 300 mg daily, hydralazine 37.5 mg TID.   ?- BP soft 3/25>>BP 90s/40s, held clonidine and hydralazine and irbesartan.  Switch acebutolol to metoprolol 25 mg twice daily.  BP now improved, added back hydralazine.  Would discharge on metoprolol and hydralazine>>>monitor BP ?  ?The patient been seen by Dr. Gardiner Rhyme today and deemed ready for discharge home. All follow-up appointments have been scheduled. Discharge medications are listed below.   ? ?Did the patient have an acute coronary syndrome (MI, NSTEMI, STEMI, etc) this admission?:  No                               ?Did the patient have a percutaneous coronary intervention (stent / angioplasty)?:  No.   ? ?Discharge Vitals ?Blood pressure 125/72, pulse 98, temperature 98.1 ?F (36.7 ?C), temperature source Axillary, resp. rate 16, height 5\' 7"  (  1.702 m), weight 126.1 kg, SpO2 97 %.  ?Filed Weights  ? 11/21/21 0419 11/22/21 0521 11/24/21 0528  ?Weight: 126.4 kg 126.9 kg 126.1 kg  ? ? ?Labs & Radiologic Studies  ?  ?CBC ?Recent Labs  ?  11/24/21 ?0352 11/25/21 ?0345  ?WBC 7.2 7.9  ?HGB 10.5* 11.4*  ?HCT 33.3* 35.9*  ?MCV 92.2 92.5  ?PLT 235 236  ? ?Basic Metabolic Panel ?Recent Labs  ?  11/24/21 ?0352 11/25/21 ?0345  ?NA 141 139  ?K 3.7 3.9  ?CL 107 105  ?CO2 26 25  ?GLUCOSE 94 96  ?BUN 17 17  ?CREATININE 1.52* 1.64*  ?CALCIUM 9.2 9.5  ?MG 1.9 1.9  ? ?High Sensitivity Troponin:   ?Recent Labs  ?Lab 11/17/21 ?1556 11/17/21 ?2225  ?TROPONINIHS 23* 29*  ? ?_____________  ?DG Chest 2 View ? ?Result Date: 11/17/2021 ?CLINICAL DATA:  Chest pain in a 76 year old female. EXAM: CHEST - 2 VIEW COMPARISON:  November 07, 2021. FINDINGS: Lungs are hyperinflated. No signs of pleural effusion. No lobar consolidative changes. Lingular opacity likely  scarring or atelectasis unchanged. Cardiomediastinal contours are enlarged with central pulmonary vascular congestion without signs of frank edema. On limited assessment there is no acute skeletal finding. IMPRESSIO

## 2021-11-26 ENCOUNTER — Other Ambulatory Visit (HOSPITAL_COMMUNITY): Payer: Self-pay

## 2021-11-30 ENCOUNTER — Other Ambulatory Visit (HOSPITAL_COMMUNITY): Payer: Self-pay

## 2021-11-30 ENCOUNTER — Ambulatory Visit: Payer: Medicare Other | Admitting: Cardiology

## 2021-12-01 ENCOUNTER — Other Ambulatory Visit: Payer: Self-pay | Admitting: *Deleted

## 2021-12-01 DIAGNOSIS — N1832 Chronic kidney disease, stage 3b: Secondary | ICD-10-CM | POA: Diagnosis not present

## 2021-12-01 DIAGNOSIS — N170 Acute kidney failure with tubular necrosis: Secondary | ICD-10-CM | POA: Diagnosis not present

## 2021-12-02 LAB — BASIC METABOLIC PANEL
BUN/Creatinine Ratio: 12 (ref 12–28)
BUN: 23 mg/dL (ref 8–27)
CO2: 22 mmol/L (ref 20–29)
Calcium: 9.9 mg/dL (ref 8.7–10.3)
Chloride: 98 mmol/L (ref 96–106)
Creatinine, Ser: 1.85 mg/dL — ABNORMAL HIGH (ref 0.57–1.00)
Glucose: 112 mg/dL — ABNORMAL HIGH (ref 70–99)
Potassium: 4.2 mmol/L (ref 3.5–5.2)
Sodium: 137 mmol/L (ref 134–144)
eGFR: 28 mL/min/{1.73_m2} — ABNORMAL LOW (ref >59)

## 2021-12-03 ENCOUNTER — Other Ambulatory Visit (HOSPITAL_COMMUNITY): Payer: Self-pay

## 2021-12-03 ENCOUNTER — Telehealth (HOSPITAL_COMMUNITY): Payer: Self-pay

## 2021-12-03 NOTE — Telephone Encounter (Signed)
Pharmacy Transitions of Care Follow-up Telephone Call ? ?Date of discharge: 11/25/2021  ?Discharge Diagnosis: severe aortic stenosis ? ?How have you been since you were released from the hospital? Good   ? ?Medication changes made at discharge: ?START taking: ?apixaban Arne [place])  ?metoprolol tartrate (LOPRESSOR)  ?CHANGE how you take: ?furosemide (LASIX)  ?STOP taking: ?acebutolol 200 MG capsule (SECTRAL)  ?cloNIDine 0.1 MG tablet (CATAPRES)  ?olmesartan 40 MG tablet (BENICAR)  ? ?Medication changes verified by the patient? Yes  ?  ? ?Medication Accessibility: ?Home Pharmacy: [facility] on [place], [place]  ? ?Was the patient provided with refills on discharged medications? Yes  ? ?Have all prescriptions been transferred from [facility] to home pharmacy? Yes  ? ?Is the patient able to afford medications? yes ?Notable copays: $47 30-day supply of Eliquis ? ?Medication Review: ?APIXABAN (ELIQUIS)  ?Patient is on 5 mg of Eliquis. ?- Discussed importance of taking medication around the same time everyday  ?- Advised patient of medications to avoid (NSAIDs, ASA)  ?- Educated that Tylenol (acetaminophen) will be the preferred analgesic to prevent risk of bleeding  ?- Emphasized importance of monitoring for signs and symptoms of bleeding (abnormal bruising, prolonged bleeding, nose bleeds, bleeding from gums, discolored urine, black tarry stools)  ?- Advised patient to alert all providers of anticoagulation therapy prior to starting a new medication or having a procedure  ? ? ?Follow-up Appointments: ? ?[facility] f/u appt confirmed?  Scheduled to see [facility] on 12/09/2021. ? ?If their condition worsens, is the pt aware to call PCP or go to the Emergency Dept.? Yes ? ?Final Patient Assessment: ?-Pt is doing well.  ?-Pt verbalized understanding of Eliquis.  ?-Declined patient education ?-Pt has post discharge appointment and refills sent to [facility] in [place], [place]  ? ? ?

## 2021-12-08 NOTE — Progress Notes (Signed)
?[facility]   ?[place] ?                                    ?Cardiology Office Note:   ? ?Date:  12/10/2021  ? ?ID:  MUMTAZ LOVINS, DOB 1945/09/17, MRN [id number] ? ?PCP:  Ronita Hipps, MD  ?[facility] Cardiologist:  Shirlee More, MD ?[facility] Electrophysiologist:  None  ? ?Referring MD: Ronita Hipps, MD  ? ?Chief Complaint  ?Patient presents with  ? Follow-up  ?  Hospital follow up   ? ?History of Present Illness:   ? ?Lindsay [name] is a 76 y.o. female with a hx of essential hypertension, hyperlipidemia, atrial fibrillation on Eliquis, hypercalcemia, breast cancer s/p left breast mastectomy (chemo only/no radiation), CKD stage IIIb, and severe symptomatic aortic stenosis who was seen during a recent hospitalization for the evaluation to possible TAVR.   ? ?Ms. Hannon lives in [place], [place] with her husband of many years. They have two adult children who also live close by in the area. She has been rather functional with ADL/IADLs and housework until several months ago. She was previously able to due her home activities without SOB or taking frequent breaks. More recently she will have to sit and rest with minimal activity. She has been unable to lay flat to sleep and has intermittent leg swelling. She ambulates without assistance in her home and for short distances however may use a cane for longer distances while out running errands. She has full upper and lower dentures therefore does not follow regularly with a dentist.  ?  ?She has been followed by Dr. Bettina Gavia for her cardiology care. She had an echocardiogram 05/2017 that showed a normal LVEF at 60-65% with no aortic stenosis noted at that time. He was following her mainly for palpitations/PAT and hypertension. By 04/2021, she was seen in follow up at which time a murmur was heard on exam prompting repeat echocardiogram that showed severe low flow aortic stenosis with an LVEF at 55-60%, G2DD, mean gradient at  25.59mHg, AVA by VTI at 0.72cm2, SVI 20. She was asymptomatic at that time. She was monitored closely in the OP setting and began to have symptoms around February of this year. TAVR was mentioned however she was rather reluctant to proceed. Her diuretics were adjusted and plan was to see her back in close follow up. On evaluation, 11/05/21 she continued to have HF symptoms and continued to defer valve workup.  ?  ?Unfortunately on most recent evaluation with Dr. MBettina Gavia3/21/23 she was noted to have markedly deteriorated with worsening HF symptoms. Plan was for hospital admission and further workup for aortic stenosis.  ?  ?She was seen by the structural heart team during her hospitalization and was comfortable without chest pain, palpitations, SOB, or dizziness. She has marked improvement in LE edema and orthopnea symptoms with IV diuretics. Patient and her family are now agreeable with pursuing aortic valve treatment.  ? ?Repeat echocardiogram from 11/18/21 showed an LVEF at 55-60% with moderate to severe aortic stenosis with mean gradient at 240mg, peak 50.36m72m, AVA 0.69cm2, and DVI at 0.20. [facility] showed patent coronary arteries, mild pulmonary hypertension, and elevated wedge pressures. She had marked AKI with contrast dye and therefore did not undergo pre TAVR CT scanning at that time. Creatinine on 12/01/21 was up to 1.85. She was discharged on PO  Lasix 56m QD.  ? ?Today she is here with her husband and reports that she has been doing very well since discharge. She has had no SOB, chest pain, LE edema, orthopnea, dizziness, or syncope. She has tolerated her medications without issues. She was referred to nephrology after hospital discharge and was contacted with an appointment scheduled for 12/17/21. Given her creatinine and GFR, we will need nephrology clearance prior to proceeding with pre TAVR scans. This was discussed with the patient and her husband.  ?  ?Past Medical History:  ?Diagnosis Date  ?  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 Baker's cyst of knee, left 02/12/2020  ? Breast cancer ([facility]   ? Cancer ([facility]   ? left breast cancer  ? Chronic pain of left knee 02/12/2020  ? Dyspnea   ? Dysrhythmia   ? PACs and nonsustained runs of atrial tach by Holter 10/13/16  ? Family history of breast cancer   ? Family history of colon cancer   ? Family history of melanoma   ? Family history of pancreatic cancer   ? Genetic testing 09/15/2017  ? The patient had genetic testing due to a personal history of breast cancer and skin cancer,  and a family history of breast cancer, pancreatic cancer, melanoma, and colon cancer.  The Common Hereditary Cancer Panel + Melanoma Panel + Basal Cell Nevus Syndrome Panel was ordered (58 genes).  The following genes were evaluated for sequence changes and exonic deletions/duplications: APC, ATM, AXIN2, B  ? History of kidney stones   ? History of left breast cancer 12/06/2017  ? Hyperlipidemia 04/11/2019  ? Hypertension   ? Malignant neoplasm of upper-inner quadrant of left breast in female, estrogen receptor negative ([facility] 06/21/2017  ? Palpitation 02/01/2016  ? PAT (paroxysmal atrial tachycardia) ([facility] 10/30/2016  ? Personal history of colonic polyps   ? Personal history of skin cancer   ? Port-A-Cath in place 07/01/2017  ? SVT (supraventricular tachycardia) ([facility] 05/31/2018  ? ? ?Past Surgical History:  ?Procedure Laterality Date  ? COLONOSCOPY    ? FOOT SURGERY Right   ? IR CV LINE INJECTION  08/02/2017  ? left breast lumpectomy  2000  ? MASTECTOMY WITH AXILLARY LYMPH NODE DISSECTION Left 12/06/2017  ? Procedure: LEFT MASTECTOMY WITH TARGETED LYMPH NODE DISSECTION;  Surgeon: BCoralie Keens MD;  [place]  Service: General;  Laterality: Left;  ? PORT-A-CATH REMOVAL Right 01/13/2018  ? Procedure: REMOVAL PORT-A-CATH;  Surgeon: BCoralie Keens MD;  [place]  Service: General;  Laterality: Right;  ? PORTACATH PLACEMENT Right 06/30/2017  ?  Procedure: [place]  Surgeon: BCoralie Keens MD;  [place]  Service: General;  Laterality: Right;  ? RIGHT HEART CATH AND CORONARY ANGIOGRAPHY N/A 11/23/2021  ? Procedure: RIGHT HEART CATH AND CORONARY ANGIOGRAPHY;  Surgeon: CSherren Mocha MD;  [place];  Service: Cardiovascular;  Laterality: N/A;  ? ? ?Current Medications: ?Current Meds  ?Medication Sig  ? acetaminophen (TYLENOL) 500 MG tablet Take 1,000 mg by mouth every 6 (six) hours as needed for moderate pain or headache.  ? albuterol (VENTOLIN HFA) 108 (90 Base) MCG/ACT inhaler Inhale 2 puffs into the lungs every 4 (four) hours as needed for shortness of breath or wheezing.  ? apixaban (ELIQUIS) 5 MG TABS tablet Take 1 tablet (5 mg total) by mouth 2 (two) times daily.  ? atorvastatin (LIPITOR) 10 MG tablet Take 10 mg by mouth at bedtime.  ?  BREO ELLIPTA 100-25 MCG/ACT AEPB Inhale 1 puff into the lungs daily.  ? furosemide (LASIX) 40 MG tablet Take 1 tablet (40 mg total) by mouth daily.  ? hydrALAZINE (APRESOLINE) 25 MG tablet Take 1.5 tablets (37.5 mg total) by mouth 3 (three) times daily.  ? ipratropium-albuterol (DUONEB) 0.5-2.5 (3) MG/3ML SOLN Take 3 mLs by nebulization every 6 (six) hours as needed (shortness of breath).  ? Magnesium 250 MG TABS Take 250 mg by mouth daily.  ? metoprolol tartrate (LOPRESSOR) 25 MG tablet Take 1 tablet (25 mg total) by mouth 2 (two) times daily.  ? montelukast (SINGULAIR) 10 MG tablet Take 10 mg by mouth daily.  ? Omega-3 1000 MG CAPS Take 1,000 mg by mouth 2 (two) times daily.  ? potassium chloride (KLOR-CON) 10 MEQ tablet Take 1 tablet (10 mEq total) by mouth daily.  ?  ? ?Allergies:   Patient has no known allergies.  ? ?Social History  ? ?Socioeconomic History  ? Marital status: Married  ?  Spouse name: Not on file  ? Number of children: Not on file  ? Years of education: Not on file  ? Highest education level: Not on file  ?Occupational History  ? Not on file   ?Tobacco Use  ? Smoking status: Former  ?  Packs/day: 2.00  ?  Years: 50.00  ?  Pack years: 100.00  ?  Types: Cigarettes  ?  Quit date: 06/02/2014  ?  Years since quitting: 7.5  ? Smokeless tobacco: Never  ?Vaping

## 2021-12-09 ENCOUNTER — Ambulatory Visit: Payer: Medicare Other | Admitting: Cardiology

## 2021-12-09 VITALS — BP 153/75 | HR 76 | Ht 67.0 in | Wt 275.0 lb

## 2021-12-09 DIAGNOSIS — N170 Acute kidney failure with tubular necrosis: Secondary | ICD-10-CM | POA: Diagnosis not present

## 2021-12-09 DIAGNOSIS — I350 Nonrheumatic aortic (valve) stenosis: Secondary | ICD-10-CM

## 2021-12-09 DIAGNOSIS — I471 Supraventricular tachycardia: Secondary | ICD-10-CM | POA: Diagnosis not present

## 2021-12-09 DIAGNOSIS — I10 Essential (primary) hypertension: Secondary | ICD-10-CM | POA: Diagnosis not present

## 2021-12-09 DIAGNOSIS — N1832 Chronic kidney disease, stage 3b: Secondary | ICD-10-CM | POA: Diagnosis not present

## 2021-12-09 DIAGNOSIS — I5033 Acute on chronic diastolic (congestive) heart failure: Secondary | ICD-10-CM

## 2021-12-09 DIAGNOSIS — E782 Mixed hyperlipidemia: Secondary | ICD-10-CM

## 2021-12-09 NOTE — Patient Instructions (Signed)
Medication Instructions:  ?Your physician recommends that you continue on your current medications as directed. Please refer to the Current Medication list given to you today.  ?*If you need a refill on your cardiac medications before your next appointment, please call your pharmacy* ? ? ?Lab Work: ?TODAY: BMET ?If you have labs (blood work) drawn today and your tests are completely normal, you will receive your results only by: ?MyChart Message (if you have MyChart) OR ?A paper copy in the mail ?If you have any lab test that is abnormal or we need to change your treatment, we will call you to review the results. ? ? ?Testing/Procedures: ?NONE ? ? ?Follow-Up: ?At [facility], you and your health needs are our priority.  As part of our continuing mission to provide you with exceptional heart care, we have created designated Provider Care Teams.  These Care Teams include your primary Cardiologist (physician) and Advanced Practice Providers (APPs -  Physician Assistants and Nurse Practitioners) who all work together to provide you with the care you need, when you need it. ? ?We recommend signing up for the patient portal called "MyChart".  Sign up information is provided on this After Visit Summary.  MyChart is used to connect with patients for Virtual Visits (Telemedicine).  Patients are able to view lab/test results, encounter notes, upcoming appointments, etc.  Non-urgent messages can be sent to your provider as well.   ?To learn more about what you can do with MyChart, go to [email or web address].   ? ?Your next appointment:   ?SOMEONE FROM THE STRUCTURAL TEAM WILL CALL YOU WITH A SCHEDULED FOLLOW-UP  ? ?Important Information About Sugar ? ? ? ? ?  ?

## 2021-12-09 NOTE — Progress Notes (Signed)
?Cardiology Office Note:   ? ?Date:  12/10/2021  ? ?ID:  NOLITA KUTTER, DOB October 01, 1945, MRN [id number] ? ?PCP:  Ronita Hipps, MD  ?Cardiologist:  Shirlee More, MD   ? ?Referring MD: Ronita Hipps, MD  ? ? ?ASSESSMENT:   ? ?1. Nonrheumatic aortic valve stenosis   ?2. Hypertensive heart disease with chronic diastolic congestive heart failure ([place])   ?3. Acute on chronic diastolic heart failure ([place])   ? ?PLAN:   ? ?In order of problems listed above: ? ?She has severe symptomatic aortic stenosis recent hospitalization with heart failure fragile renal function with acute deterioration with attempted diuresis awaiting nephrology consultation and CT surgery evaluation. ?Improved heart failure is compensated she is off ARB with her renal deterioration on loop diuretic continue the same.  Other antihypertensives involved hydralazine and her beta-blocker. ? ? ?Next appointment: 6 weeks ? ? ?Medication Adjustments/Labs and Tests Ordered: ?Current medicines are reviewed at length with the patient today.  Concerns regarding medicines are outlined above.  ?No orders of the defined types were placed in this encounter. ? ?Meds ordered this encounter  ?Medications  ? furosemide (LASIX) 40 MG tablet  ?  Sig: Take 1 tablet (40 mg total) by mouth daily.  ?  Dispense:  90 tablet  ?  Refill:  3  ? metoprolol tartrate (LOPRESSOR) 25 MG tablet  ?  Sig: Take 1 tablet (25 mg total) by mouth 2 (two) times daily.  ?  Dispense:  180 tablet  ?  Refill:  3  ? ? ?Chief Complaint  ?Patient presents with  ? Follow-up  ? Congestive Heart Failure  ? ? ?History of Present Illness:   ? ?Lindsay [name] is a 76 y.o. female with a hx of severe symptomatic aortic stenosis who initially decided not to pursue intervention heart failure hypertension hyperlipidemia atrial tachycardia and a [facility] admission with severe hypercalcemia subsequently last seen 11/17/2021 with severe decompensated heart failure requiring direct admission to [facility] ? ?Compliance with diet, lifestyle and medications: Yes ? ?Her daughter is present actively involved in her mother's care ?She is sodium restricted and weighs daily and her weight is down 7 or 8 pounds and stable ?She is short of breath more than usual activities but no edema orthopnea chest pain palpitation or syncope ?Seeing nephrology next week ?Has appointment with CT surgery ? ?She was recently admitted to [facility] 11/17/2021 with decompensated heart failure in the context of severe aortic stenosis.  Heart failure was difficult to manage as an inpatient because of marked deterioration in renal function with diuresis.  She was seen by structural heart with plans to pursue TAVR. ? ?Recent inpatient right and left heart catheterization showed severe aortic stenosis patent coronary arteries with no significant stenosis and hemodynamics consistent with mild pulmonary artery hypertension with PVR of less than 1 ? ?Past Medical History:  ?Diagnosis Date  ? 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 Baker's cyst of knee, left 02/12/2020  ? Breast cancer ([place])   ? Cancer [facility])   ? left breast cancer  ? Chronic pain of left knee 02/12/2020  ? Dyspnea   ? Dysrhythmia   ? PACs and nonsustained runs of atrial tach by Holter 10/13/16  ? Family history of breast cancer   ? Family history of colon cancer   ? Family history of melanoma   ? Family history of pancreatic cancer   ? Genetic testing 09/15/2017  ? The patient  had genetic testing due to a personal history of breast cancer and skin cancer,  and a family history of breast cancer, pancreatic cancer, melanoma, and colon cancer.  The Common Hereditary Cancer Panel + Melanoma Panel + Basal Cell Nevus Syndrome Panel was ordered (58 genes).  The following genes were evaluated for sequence changes and exonic deletions/duplications: APC, ATM, AXIN2, B  ? History of kidney stones   ? History of left breast cancer 12/06/2017  ? Hyperlipidemia 04/11/2019   ? Hypertension   ? Malignant neoplasm of upper-inner quadrant of left breast in female, estrogen receptor negative ([place]) 06/21/2017  ? Palpitation 02/01/2016  ? PAT (paroxysmal atrial tachycardia) ([place]) 10/30/2016  ? Personal history of colonic polyps   ? Personal history of skin cancer   ? Port-A-Cath in place 07/01/2017  ? SVT (supraventricular tachycardia) ([place]) 05/31/2018  ? ? ?Past Surgical History:  ?Procedure Laterality Date  ? COLONOSCOPY    ? FOOT SURGERY Right   ? IR CV LINE INJECTION  08/02/2017  ? left breast lumpectomy  2000  ? MASTECTOMY WITH AXILLARY LYMPH NODE DISSECTION Left 12/06/2017  ? Procedure: LEFT MASTECTOMY WITH TARGETED LYMPH NODE DISSECTION;  Surgeon: Coralie Keens, MD;  Location: [place];  Service: General;  Laterality: Left;  ? PORT-A-CATH REMOVAL Right 01/13/2018  ? Procedure: REMOVAL PORT-A-CATH;  Surgeon: Coralie Keens, MD;  Location: [place];  Service: General;  Laterality: Right;  ? PORTACATH PLACEMENT Right 06/30/2017  ? Procedure: [place];  Surgeon: Coralie Keens, MD;  Location: [place];  Service: General;  Laterality: Right;  ? RIGHT HEART CATH AND CORONARY ANGIOGRAPHY N/A 11/23/2021  ? Procedure: RIGHT HEART CATH AND CORONARY ANGIOGRAPHY;  Surgeon: Sherren Mocha, MD;  Location: [place] CV LAB;  Service: Cardiovascular;  Laterality: N/A;  ? ? ?Current Medications: ?Current Meds  ?Medication Sig  ? acetaminophen (TYLENOL) 500 MG tablet Take 1,000 mg by mouth every 6 (six) hours as needed for moderate pain or headache.  ? albuterol (VENTOLIN HFA) 108 (90 Base) MCG/ACT inhaler Inhale 2 puffs into the lungs every 4 (four) hours as needed for shortness of breath or wheezing.  ? apixaban (ELIQUIS) 5 MG TABS tablet Take 1 tablet (5 mg total) by mouth 2 (two) times daily.  ? atorvastatin (LIPITOR) 10 MG tablet Take 10 mg by mouth at bedtime.  ? BREO ELLIPTA 100-25 MCG/ACT AEPB Inhale 1 puff into the lungs daily.  ? hydrALAZINE (APRESOLINE)  25 MG tablet Take 1.5 tablets (37.5 mg total) by mouth 3 (three) times daily.  ? ipratropium-albuterol (DUONEB) 0.5-2.5 (3) MG/3ML SOLN Take 3 mLs by nebulization every 6 (six) hours as needed (shortness of breath).  ? Magnesium 250 MG TABS Take 250 mg by mouth daily.  ? montelukast (SINGULAIR) 10 MG tablet Take 10 mg by mouth daily.  ? Omega-3 1000 MG CAPS Take 1,000 mg by mouth 2 (two) times daily.  ? potassium chloride (KLOR-CON) 10 MEQ tablet Take 1 tablet (10 mEq total) by mouth daily.  ? [DISCONTINUED] furosemide (LASIX) 40 MG tablet Take 1 tablet (40 mg total) by mouth daily.  ? [DISCONTINUED] metoprolol tartrate (LOPRESSOR) 25 MG tablet Take 1 tablet (25 mg total) by mouth 2 (two) times daily.  ?  ? ?Allergies:   Patient has no known allergies.  ? ?Social History  ? ?Socioeconomic History  ? Marital status: Married  ?  Spouse name: Not on file  ? Number of children: Not on file  ? Years of education:  Not on file  ? Highest education level: Not on file  ?Occupational History  ? Not on file  ?Tobacco Use  ? Smoking status: Former  ?  Packs/day: 2.00  ?  Years: 50.00  ?  Pack years: 100.00  ?  Types: Cigarettes  ?  Quit date: 06/02/2014  ?  Years since quitting: 7.5  ?  Passive exposure: Past  ? Smokeless tobacco: Never  ?Vaping Use  ? Vaping Use: Never used  ?Substance and Sexual Activity  ? Alcohol use: No  ? Drug use: No  ? Sexual activity: Yes  ?Other Topics Concern  ? Not on file  ?Social History Narrative  ? Not on file  ? ?Social Determinants of Health  ? ?Financial Resource Strain: Not on file  ?Food Insecurity: Not on file  ?Transportation Needs: Not on file  ?Physical Activity: Not on file  ?Stress: Not on file  ?Social Connections: Not on file  ?  ? ?Family History: ?The patient's family history includes Basal cell carcinoma in her sister; Breast cancer in her paternal aunt; Breast cancer (age of onset: 10) in her sister; Breast cancer (age of onset: 13) in her sister; Colon cancer (age of onset: 57)  in her sister; Melanoma in her sister; Pancreatic cancer (age of onset: 87) in her mother. ?ROS:   ?Please see the history of present illness.    ?All other systems reviewed and are negative. ? ?EKGs/Labs/Ot

## 2021-12-10 ENCOUNTER — Encounter: Payer: Self-pay | Admitting: Cardiology

## 2021-12-10 ENCOUNTER — Ambulatory Visit: Payer: Medicare Other | Admitting: Cardiology

## 2021-12-10 VITALS — BP 137/65 | HR 86 | Ht 67.0 in | Wt 274.0 lb

## 2021-12-10 DIAGNOSIS — I110 Hypertensive heart disease with heart failure: Secondary | ICD-10-CM

## 2021-12-10 DIAGNOSIS — I5033 Acute on chronic diastolic (congestive) heart failure: Secondary | ICD-10-CM

## 2021-12-10 DIAGNOSIS — I5032 Chronic diastolic (congestive) heart failure: Secondary | ICD-10-CM | POA: Diagnosis not present

## 2021-12-10 DIAGNOSIS — I350 Nonrheumatic aortic (valve) stenosis: Secondary | ICD-10-CM

## 2021-12-10 LAB — BASIC METABOLIC PANEL
BUN/Creatinine Ratio: 16 (ref 12–28)
BUN: 28 mg/dL — ABNORMAL HIGH (ref 8–27)
CO2: 21 mmol/L (ref 20–29)
Calcium: 9.6 mg/dL (ref 8.7–10.3)
Chloride: 101 mmol/L (ref 96–106)
Creatinine, Ser: 1.8 mg/dL — ABNORMAL HIGH (ref 0.57–1.00)
Glucose: 93 mg/dL (ref 70–99)
Potassium: 4.1 mmol/L (ref 3.5–5.2)
Sodium: 141 mmol/L (ref 134–144)
eGFR: 28 mL/min/{1.73_m2} — ABNORMAL LOW (ref >59)

## 2021-12-10 MED ORDER — FUROSEMIDE 40 MG PO TABS: 40.0000 mg | ORAL_TABLET | Freq: Every day | ORAL | 3 refills | Status: DC

## 2021-12-10 MED ORDER — METOPROLOL TARTRATE 25 MG PO TABS: 25.0000 mg | ORAL_TABLET | Freq: Two times a day (BID) | ORAL | 3 refills | Status: DC

## 2021-12-10 NOTE — Patient Instructions (Signed)
Medication Instructions:  ?Your physician recommends that you continue on your current medications as directed. Please refer to the Current Medication list given to you today. ? ?*If you need a refill on your cardiac medications before your next appointment, please call your pharmacy* ? ? ?Lab Work: ?NONE ?If you have labs (blood work) drawn today and your tests are completely normal, you will receive your results only by: ?MyChart Message (if you have MyChart) OR ?A paper copy in the mail ?If you have any lab test that is abnormal or we need to change your treatment, we will call you to review the results. ? ? ?Testing/Procedures: ?NONE ? ? ?Follow-Up: ?At [facility], you and your health needs are our priority.  As part of our continuing mission to provide you with exceptional heart care, we have created designated Provider Care Teams.  These Care Teams include your primary Cardiologist (physician) and Advanced Practice Providers (APPs -  Physician Assistants and Nurse Practitioners) who all work together to provide you with the care you need, when you need it. ? ?We recommend signing up for the patient portal called "MyChart".  Sign up information is provided on this After Visit Summary.  MyChart is used to connect with patients for Virtual Visits (Telemedicine).  Patients are able to view lab/test results, encounter notes, upcoming appointments, etc.  Non-urgent messages can be sent to your provider as well.   ?To learn more about what you can do with MyChart, go to [email or web address].   ? ?Your next appointment:   ?1 month(s) ? ?The format for your next appointment:   ?In Person ? ?Provider:   ?Shirlee More, MD  ? ? ?Other Instructions ? ? ?Important Information About Sugar ? ? ? ? ?  ?

## 2021-12-17 DIAGNOSIS — I350 Nonrheumatic aortic (valve) stenosis: Secondary | ICD-10-CM | POA: Diagnosis not present

## 2021-12-17 DIAGNOSIS — I5032 Chronic diastolic (congestive) heart failure: Secondary | ICD-10-CM | POA: Diagnosis not present

## 2021-12-17 DIAGNOSIS — N1831 Chronic kidney disease, stage 3a: Secondary | ICD-10-CM | POA: Diagnosis not present

## 2021-12-17 DIAGNOSIS — N179 Acute kidney failure, unspecified: Secondary | ICD-10-CM | POA: Diagnosis not present

## 2021-12-18 ENCOUNTER — Encounter: Payer: Self-pay | Admitting: Cardiology

## 2021-12-18 ENCOUNTER — Other Ambulatory Visit: Payer: Self-pay | Admitting: Cardiology

## 2021-12-18 DIAGNOSIS — I350 Nonrheumatic aortic (valve) stenosis: Secondary | ICD-10-CM

## 2021-12-25 ENCOUNTER — Ambulatory Visit (HOSPITAL_COMMUNITY)
Admission: RE | Admit: 2021-12-25 | Discharge: 2021-12-25 | Disposition: A | Discharge status: Home / Self Care | Payer: Medicare Other | Source: Ambulatory Visit | Attending: Cardiology | Admitting: Cardiology

## 2021-12-25 ENCOUNTER — Encounter (HOSPITAL_COMMUNITY)
Admission: RE | Admit: 2021-12-25 | Discharge: 2021-12-25 | Disposition: A | Discharge status: Home / Self Care | Payer: Medicare Other | Source: Ambulatory Visit | Attending: Cardiovascular Disease | Admitting: Cardiovascular Disease

## 2021-12-25 DIAGNOSIS — N179 Acute kidney failure, unspecified: Secondary | ICD-10-CM | POA: Insufficient documentation

## 2021-12-25 DIAGNOSIS — N2889 Other specified disorders of kidney and ureter: Secondary | ICD-10-CM | POA: Diagnosis not present

## 2021-12-25 DIAGNOSIS — I350 Nonrheumatic aortic (valve) stenosis: Secondary | ICD-10-CM

## 2021-12-25 DIAGNOSIS — I700 Atherosclerosis of aorta: Secondary | ICD-10-CM | POA: Diagnosis not present

## 2021-12-25 LAB — BASIC METABOLIC PANEL
Anion gap: 6 (ref 5–15)
BUN: 33 mg/dL — ABNORMAL HIGH (ref 8–23)
CO2: 25 mmol/L (ref 22–32)
Calcium: 9.4 mg/dL (ref 8.9–10.3)
Chloride: 105 mmol/L (ref 98–111)
Creatinine, Ser: 1.87 mg/dL — ABNORMAL HIGH (ref 0.44–1.00)
GFR, Estimated: 27 mL/min — ABNORMAL LOW (ref >60)
Glucose, Bld: 100 mg/dL — ABNORMAL HIGH (ref 70–99)
Potassium: 3.6 mmol/L (ref 3.5–5.1)
Sodium: 136 mmol/L (ref 135–145)

## 2021-12-25 MED ORDER — SODIUM CHLORIDE 0.9 % WEIGHT BASED INFUSION: 1.0000 mL/kg/h | INTRAVENOUS | Status: AC

## 2021-12-25 MED ORDER — IOHEXOL 350 MG/ML SOLN
100.0000 mL | Freq: Once | INTRAVENOUS | Status: AC | PRN
Start: 2021-12-25 — End: 2021-12-25
  Administered 2021-12-25: 100 mL via INTRAVENOUS

## 2021-12-25 MED ORDER — SODIUM CHLORIDE 0.9 % WEIGHT BASED INFUSION
3.0000 mL/kg/h | INTRAVENOUS | Status: AC
  Administered 2021-12-25: 3 mL/kg/h via INTRAVENOUS

## 2021-12-25 NOTE — Progress Notes (Signed)
Pre CT hydration fluids started at 0930.  CT notified at [facility] that pt would be ready at 1030 ?

## 2021-12-30 ENCOUNTER — Telehealth: Payer: Self-pay

## 2021-12-30 DIAGNOSIS — N289 Disorder of kidney and ureter, unspecified: Secondary | ICD-10-CM

## 2021-12-30 DIAGNOSIS — I350 Nonrheumatic aortic (valve) stenosis: Secondary | ICD-10-CM

## 2021-12-30 NOTE — Telephone Encounter (Signed)
Pt underwent TAVR CT scans on 4/28 with hydration due to CKD. I spoke with the pt's daughter-in-law, Drue Dun, in regards to arranging a follow-up BMP next week to assess her kidney function after receiving contrast. Per Drue Dun she can take the pt to local [facility] next week to have this drawn.  I have placed order in Epic for this blood draw. Drue Dun also asked me to fax a copy of the pt's order to [facility] in case the pt goes to this location for labs (fax [phone]).  ?

## 2022-01-05 DIAGNOSIS — D485 Neoplasm of uncertain behavior of skin: Secondary | ICD-10-CM | POA: Diagnosis not present

## 2022-01-06 DIAGNOSIS — N289 Disorder of kidney and ureter, unspecified: Secondary | ICD-10-CM | POA: Diagnosis not present

## 2022-01-06 DIAGNOSIS — I350 Nonrheumatic aortic (valve) stenosis: Secondary | ICD-10-CM | POA: Diagnosis not present

## 2022-01-18 DIAGNOSIS — R7989 Other specified abnormal findings of blood chemistry: Secondary | ICD-10-CM | POA: Insufficient documentation

## 2022-01-18 DIAGNOSIS — I509 Heart failure, unspecified: Secondary | ICD-10-CM | POA: Insufficient documentation

## 2022-01-18 DIAGNOSIS — I517 Cardiomegaly: Secondary | ICD-10-CM | POA: Insufficient documentation

## 2022-01-18 DIAGNOSIS — R0601 Orthopnea: Secondary | ICD-10-CM | POA: Insufficient documentation

## 2022-01-18 DIAGNOSIS — D709 Neutropenia, unspecified: Secondary | ICD-10-CM | POA: Insufficient documentation

## 2022-01-18 DIAGNOSIS — I482 Chronic atrial fibrillation, unspecified: Secondary | ICD-10-CM

## 2022-01-18 DIAGNOSIS — R8271 Bacteriuria: Secondary | ICD-10-CM

## 2022-01-18 DIAGNOSIS — R0609 Other forms of dyspnea: Secondary | ICD-10-CM | POA: Insufficient documentation

## 2022-01-18 DIAGNOSIS — E876 Hypokalemia: Secondary | ICD-10-CM | POA: Insufficient documentation

## 2022-01-18 DIAGNOSIS — R0902 Hypoxemia: Secondary | ICD-10-CM | POA: Insufficient documentation

## 2022-01-18 DIAGNOSIS — J449 Chronic obstructive pulmonary disease, unspecified: Secondary | ICD-10-CM | POA: Insufficient documentation

## 2022-01-18 DIAGNOSIS — D72829 Elevated white blood cell count, unspecified: Secondary | ICD-10-CM | POA: Insufficient documentation

## 2022-01-18 DIAGNOSIS — J209 Acute bronchitis, unspecified: Secondary | ICD-10-CM | POA: Insufficient documentation

## 2022-01-18 DIAGNOSIS — K5792 Diverticulitis of intestine, part unspecified, without perforation or abscess without bleeding: Secondary | ICD-10-CM

## 2022-01-18 DIAGNOSIS — I10 Essential (primary) hypertension: Secondary | ICD-10-CM

## 2022-01-18 HISTORY — DX: Other forms of dyspnea: R06.09

## 2022-01-18 HISTORY — DX: Chronic obstructive pulmonary disease, unspecified: J44.9

## 2022-01-18 HISTORY — DX: Essential (primary) hypertension: I10

## 2022-01-18 HISTORY — DX: Hypokalemia: E87.6

## 2022-01-18 HISTORY — DX: Hypercalcemia: E83.52

## 2022-01-18 HISTORY — DX: Heart failure, unspecified: I50.9

## 2022-01-18 HISTORY — DX: Other specified abnormal findings of blood chemistry: R79.89

## 2022-01-18 HISTORY — DX: Acute bronchitis, unspecified: J20.9

## 2022-01-18 HISTORY — DX: Bacteriuria: R82.71

## 2022-01-18 HISTORY — DX: Elevated white blood cell count, unspecified: D72.829

## 2022-01-18 HISTORY — DX: Neutropenia, unspecified: D70.9

## 2022-01-18 HISTORY — DX: Diverticulitis of intestine, part unspecified, without perforation or abscess without bleeding: K57.92

## 2022-01-18 HISTORY — DX: Hypoxemia: R09.02

## 2022-01-18 HISTORY — DX: Orthopnea: R06.01

## 2022-01-18 HISTORY — DX: Chronic atrial fibrillation, unspecified: I48.20

## 2022-01-18 HISTORY — DX: Cardiomegaly: I51.7

## 2022-01-19 ENCOUNTER — Encounter: Payer: Self-pay | Admitting: Cardiology

## 2022-01-19 ENCOUNTER — Ambulatory Visit: Payer: Medicare Other | Admitting: Cardiology

## 2022-01-19 VITALS — BP 134/68 | HR 90 | Ht 66.0 in | Wt 274.4 lb

## 2022-01-19 DIAGNOSIS — E785 Hyperlipidemia, unspecified: Secondary | ICD-10-CM | POA: Diagnosis not present

## 2022-01-19 DIAGNOSIS — I110 Hypertensive heart disease with heart failure: Secondary | ICD-10-CM | POA: Diagnosis not present

## 2022-01-19 DIAGNOSIS — N289 Disorder of kidney and ureter, unspecified: Secondary | ICD-10-CM | POA: Diagnosis not present

## 2022-01-19 DIAGNOSIS — N1832 Chronic kidney disease, stage 3b: Secondary | ICD-10-CM | POA: Diagnosis not present

## 2022-01-19 DIAGNOSIS — I5032 Chronic diastolic (congestive) heart failure: Secondary | ICD-10-CM

## 2022-01-19 DIAGNOSIS — I480 Paroxysmal atrial fibrillation: Secondary | ICD-10-CM | POA: Diagnosis not present

## 2022-01-19 DIAGNOSIS — N170 Acute kidney failure with tubular necrosis: Secondary | ICD-10-CM | POA: Diagnosis not present

## 2022-01-19 DIAGNOSIS — I350 Nonrheumatic aortic (valve) stenosis: Secondary | ICD-10-CM | POA: Diagnosis not present

## 2022-01-19 NOTE — Patient Instructions (Signed)
Medication Instructions:  Your physician recommends that you continue on your current medications as directed. Please refer to the Current Medication list given to you today.  *If you need a refill on your cardiac medications before your next appointment, please call your pharmacy*   Lab Work: NONE If you have labs (blood work) drawn today and your tests are completely normal, you will receive your results only by: [place] (if you have MyChart) OR A paper copy in the mail If you have any lab test that is abnormal or we need to change your treatment, we will call you to review the results.   Testing/Procedures: NONE   Follow-Up: At [facility], you and your health needs are our priority.  As part of our continuing mission to provide you with exceptional heart care, we have created designated Provider Care Teams.  These Care Teams include your primary Cardiologist (physician) and Advanced Practice Providers (APPs -  Physician Assistants and Nurse Practitioners) who all work together to provide you with the care you need, when you need it.  We recommend signing up for the patient portal called "MyChart".  Sign up information is provided on this After Visit Summary.  MyChart is used to connect with patients for Virtual Visits (Telemedicine).  Patients are able to view lab/test results, encounter notes, upcoming appointments, etc.  Non-urgent messages can be sent to your provider as well.   To learn more about what you can do with MyChart, go to [email or web address].    Your next appointment:   2 month(s)  The format for your next appointment:   In Person  Provider:   Shirlee More, MD    Other Instructions   Important Information About Sugar

## 2022-01-19 NOTE — Progress Notes (Signed)
Cardiology Office Note:    Date:  01/19/2022   ID:  Lindsay [name], DOB 28-Nov-1945, MRN [id number]  PCP:  Ronita Hipps, MD  Cardiologist:  Shirlee More, MD    Referring MD: Ronita Hipps, MD    ASSESSMENT:    1. Severe aortic stenosis   2. Renal insufficiency   3. Acute renal failure with acute tubular necrosis superimposed on stage 3b chronic kidney disease ([place])   4. Hypertensive heart disease with chronic diastolic congestive heart failure ([place])   5. PAT (paroxysmal atrial tachycardia) (HCC)    PLAN:    In order of problems listed above:  He is clinically improved doing quite well heart failure is compensated been seen by nephrology well into the process of TAVR and enthusiastic about correction of her valvular heart disease we will draw labs today to requested by nephrology including BMP parathyroid hormone intact and hemoglobin.  Continue her current loop diuretic I will see him back in the office in July following CT surgery evaluation hopefully correction.  Blood pressure at target she will continue her beta-blocker especially with her atrial tachycardia along with hydralazine and anticoagulant.   Next appointment: July   Medication Adjustments/Labs and Tests Ordered: Current medicines are reviewed at length with the patient today.  Concerns regarding medicines are outlined above.  Orders Placed This Encounter  Procedures   Magnesium   Hemoglobin A1c   PTH, intact (no Ca)   Protein / creatinine ratio, urine   No orders of the defined types were placed in this encounter.   Chief Complaint  Patient presents with   Follow-up   Aortic Stenosis   Congestive Heart Failure    History of Present Illness:    Lindsay [name] is a 76 y.o. female with a hx of  severe symptomatic aortic stenosis who initially decided not to pursue intervention heart failure hypertension hyperlipidemia atrial tachycardia and a [facility] admission with severe hypercalcemia subsequently  last seen 11/17/2021 with severe decompensated heart failure requiring direct admission to [facility].  She was recently admitted to [facility] 11/17/2021 with decompensated heart failure in the context of severe aortic stenosis.  Heart failure was difficult to manage as an inpatient because of marked deterioration in renal function with diuresis.  She was seen by structural heart with plans to pursue TAVR.   Recent inpatient right and left heart catheterization showed severe aortic stenosis patent coronary arteries with no significant stenosis and hemodynamics consistent with mild pulmonary artery hypertension with PVR of less than 1    She was last seen 12/10/2021.  Cardiac CT 12/28/2021: IMPRESSION: 1. Tricuspid aortic valve with severely reduced cusp excursion. Severely thickened and severely calcified aortic valve cusps.   2.  Aortic valve calcium score: 1549   3. Annulus area: 496 mm2, suitable for 26 mm Sapien 3 valve. No LVOT calcifications.   4.  Sufficient coronary artery heights from annulus.   5. Optimum fluoroscopic angle for delivery: LAO 3, CAU 4  She had BMP performed 01/06/2022 creatinine 2.05 GFR 24 cc sodium 144 potassium could not be measured.  Compliance with diet, lifestyle and medications: Yes  She is doing much better weight is stable at home takes her diuretic no shortness of breath orthopnea chest pain or syncope. She is seeing her TAVR surgeon in the next few weeks anticipates procedure She has been seen by nephrology in [place] recent creatinine was attributed to contrast load we will draw labs today I  will request Past Medical History:  Diagnosis Date   Acute bronchitis 01/18/2022   Acute on chronic diastolic heart failure (HCC)    AKI (acute kidney injury) ([place])    Antineoplastic chemotherapy induced anemia 11/03/2017   Asymptomatic bacteriuria 01/18/2022   Baker's cyst of knee, left 02/12/2020   Breast cancer ([place])    Cancer  (HCC)    left breast cancer   Cardiomegaly 01/18/2022   CHF (congestive heart failure) ([place]) 01/18/2022   Chronic atrial fibrillation ([place]) 01/18/2022   Chronic pain of left knee 02/12/2020   COPD (chronic obstructive pulmonary disease) ([place]) 01/18/2022   Diverticulitis 01/18/2022   Dyspnea    Dyspnea on exertion 01/18/2022   Dysrhythmia    PACs and nonsustained runs of atrial tach by Holter 10/13/16   Elevated brain natriuretic peptide (BNP) level 01/18/2022   Elevated troponin    Essential hypertension    Family history of breast cancer    Family history of colon cancer    Family history of melanoma    Family history of pancreatic cancer    Genetic testing 09/15/2017   The patient had genetic testing due to a personal history of breast cancer and skin cancer,  and a family history of breast cancer, pancreatic cancer, melanoma, and colon cancer.  The Common Hereditary Cancer Panel + Melanoma Panel + Basal Cell Nevus Syndrome Panel was ordered (58 genes).  The following genes were evaluated for sequence changes and exonic deletions/duplications: APC, ATM, AXIN2, B   History of kidney stones    History of left breast cancer 12/06/2017   Hypercalcemia 01/18/2022   Hyperlipidemia 04/11/2019   Hypokalemia 01/18/2022   Hypoxia 01/18/2022   Leukocytosis 01/18/2022   Malignant neoplasm of upper-inner quadrant of left breast in female, estrogen receptor negative ([place]) 06/21/2017   Neutropenic fever ([place]) 01/18/2022   Orthopnea 01/18/2022   PAF (paroxysmal atrial fibrillation) (HCC)    Palpitation 02/01/2016   PAT (paroxysmal atrial tachycardia) ([place]) 10/30/2016   Personal history of colonic polyps    Personal history of skin cancer    Port-A-Cath in place 07/01/2017   Severe aortic stenosis 11/17/2021   SVT (supraventricular tachycardia) ([place]) 05/31/2018   Uncontrolled hypertension 01/18/2022    Past Surgical History:  Procedure Laterality Date   COLONOSCOPY     FOOT SURGERY Right    IR CV LINE  INJECTION  08/02/2017   left breast lumpectomy  2000   MASTECTOMY WITH AXILLARY LYMPH NODE DISSECTION Left 12/06/2017   Procedure: LEFT MASTECTOMY WITH TARGETED LYMPH NODE DISSECTION;  Surgeon: Coralie Keens, MD;  Location: [place];  Service: General;  Laterality: Left;   PORT-A-CATH REMOVAL Right 01/13/2018   Procedure: REMOVAL PORT-A-CATH;  Surgeon: Coralie Keens, MD;  Location: [place];  Service: General;  Laterality: Right;   PORTACATH PLACEMENT Right 06/30/2017   Procedure: [place];  Surgeon: Coralie Keens, MD;  Location: [place];  Service: General;  Laterality: Right;   RIGHT HEART CATH AND CORONARY ANGIOGRAPHY N/A 11/23/2021   Procedure: RIGHT HEART CATH AND CORONARY ANGIOGRAPHY;  Surgeon: Sherren Mocha, MD;  Location: [place] [facility];  Service: Cardiovascular;  Laterality: N/A;    Current Medications: Current Meds  Medication Sig   acetaminophen (TYLENOL) 500 MG tablet Take 1,000 mg by mouth every 6 (six) hours as needed for moderate pain or headache.   albuterol (VENTOLIN HFA) 108 (90 Base) MCG/ACT inhaler Inhale 2 puffs into the lungs every 4 (four) hours as needed for shortness of  breath or wheezing.   apixaban (ELIQUIS) 5 MG TABS tablet Take 1 tablet (5 mg total) by mouth 2 (two) times daily.   atorvastatin (LIPITOR) 10 MG tablet Take 10 mg by mouth at bedtime.   BREO ELLIPTA 100-25 MCG/ACT AEPB Inhale 1 puff into the lungs daily.   furosemide (LASIX) 40 MG tablet Take 1 tablet (40 mg total) by mouth daily.   hydrALAZINE (APRESOLINE) 25 MG tablet Take 37.5 mg by mouth 3 (three) times daily.   ipratropium-albuterol (DUONEB) 0.5-2.5 (3) MG/3ML SOLN Take 3 mLs by nebulization every 6 (six) hours as needed (shortness of breath).   Magnesium 250 MG TABS Take 250 mg by mouth daily.   metoprolol tartrate (LOPRESSOR) 25 MG tablet Take 1 tablet (25 mg total) by mouth 2 (two) times daily.   montelukast (SINGULAIR) 10 MG tablet Take 10 mg  by mouth daily.   Omega-3 1000 MG CAPS Take 1,000 mg by mouth 2 (two) times daily.   potassium chloride (KLOR-CON) 10 MEQ tablet Take 1 tablet (10 mEq total) by mouth daily.     Allergies:   Patient has no known allergies.   Social History   Socioeconomic History   Marital status: Married    Spouse name: Not on file   Number of children: Not on file   Years of education: Not on file   Highest education level: Not on file  Occupational History   Not on file  Tobacco Use   Smoking status: Former    Packs/day: 2.00    Years: 50.00    Pack years: 100.00    Types: Cigarettes    Quit date: 06/02/2014    Years since quitting: 7.6    Passive exposure: Past   Smokeless tobacco: Never  Vaping Use   Vaping Use: Never used  Substance and Sexual Activity   Alcohol use: No   Drug use: No   Sexual activity: Yes  Other Topics Concern   Not on file  Social History Narrative   Not on file   Social Determinants of Health   Financial Resource Strain: Not on file  Food Insecurity: Not on file  Transportation Needs: Not on file  Physical Activity: Not on file  Stress: Not on file  Social Connections: Not on file     Family History: The patient's family history includes Basal cell carcinoma in her sister; Breast cancer in her paternal aunt; Breast cancer (age of onset: 69) in her sister; Breast cancer (age of onset: 64) in her sister; Colon cancer (age of onset: 15) in her sister; Melanoma in her sister; Pancreatic cancer (age of onset: 59) in her mother. ROS:   Please see the history of present illness.    All other systems reviewed and are negative.  EKGs/Labs/Other Studies Reviewed:    The following studies were reviewed today:   Recent Labs: 11/05/2021: NT-Pro BNP 4,799 11/17/2021: B Natriuretic Peptide 765.5 11/25/2021: Hemoglobin 11.4; Magnesium 1.9; Platelets 236 12/25/2021: BUN 33; Creatinine, Ser 1.87; Potassium 3.6; Sodium 136  Recent Lipid Panel No results found for:  CHOL, TRIG, HDL, CHOLHDL, VLDL, LDLCALC, LDLDIRECT  Physical Exam:    VS:  BP 134/68   Pulse 90   Ht _0  (1.676 m)   Wt 274 lb 6.4 oz (124.5 kg)   SpO2 95%   BMI 44.29 kg/m     Wt Readings from Last 3 Encounters:  01/19/22 274 lb 6.4 oz (124.5 kg)  12/10/21 274 lb (124.3 kg)  12/09/21 275 lb (  124.7 kg)     GEN:  Well nourished, well developed in no acute distress HEENT: Normal NECK: No JVD; No carotid bruits LYMPHATICS: No lymphadenopathy CARDIAC: 3/6 harsh murmur aortic stenosis to the carotids single S2 no aortic regurgitation RRR, RESPIRATORY:  Clear to auscultation without rales, wheezing or rhonchi  ABDOMEN: Soft, non-tender, non-distended MUSCULOSKELETAL:  No edema; No deformity  SKIN: Warm and dry NEUROLOGIC:  Alert and oriented x 3 PSYCHIATRIC:  Normal affect    Signed, Shirlee More, MD  01/19/2022 4:41 PM    [facility]

## 2022-01-20 LAB — BASIC METABOLIC PANEL
BUN/Creatinine Ratio: 16 (ref 12–28)
BUN: 29 mg/dL — ABNORMAL HIGH (ref 8–27)
CO2: 25 mmol/L (ref 20–29)
Calcium: 9.8 mg/dL (ref 8.7–10.3)
Chloride: 101 mmol/L (ref 96–106)
Creatinine, Ser: 1.77 mg/dL — ABNORMAL HIGH (ref 0.57–1.00)
Glucose: 103 mg/dL — ABNORMAL HIGH (ref 70–99)
Potassium: 4.4 mmol/L (ref 3.5–5.2)
Sodium: 142 mmol/L (ref 134–144)
eGFR: 29 mL/min/{1.73_m2} — ABNORMAL LOW (ref >59)

## 2022-01-20 LAB — PROTEIN / CREATININE RATIO, URINE
Creatinine, Urine: 41 mg/dL
Protein, Ur: 6.1 mg/dL
Protein/Creat Ratio: 149 mg/g creat (ref 0–200)

## 2022-01-20 LAB — MAGNESIUM: Magnesium: 2.5 mg/dL — ABNORMAL HIGH (ref 1.6–2.3)

## 2022-01-20 LAB — HEMOGLOBIN A1C
Est. average glucose Bld gHb Est-mCnc: 111 mg/dL
Hgb A1c MFr Bld: 5.5 % (ref 4.8–5.6)

## 2022-01-20 LAB — PARATHYROID HORMONE, INTACT (NO CA): PTH: 65 pg/mL (ref 15–65)

## 2022-01-28 ENCOUNTER — Encounter: Payer: Self-pay | Admitting: Physician Assistant

## 2022-01-28 DIAGNOSIS — I5032 Chronic diastolic (congestive) heart failure: Secondary | ICD-10-CM | POA: Diagnosis not present

## 2022-01-28 DIAGNOSIS — N1832 Chronic kidney disease, stage 3b: Secondary | ICD-10-CM | POA: Diagnosis not present

## 2022-01-28 DIAGNOSIS — I350 Nonrheumatic aortic (valve) stenosis: Secondary | ICD-10-CM | POA: Diagnosis not present

## 2022-01-28 DIAGNOSIS — D045 Carcinoma in situ of skin of trunk: Secondary | ICD-10-CM | POA: Diagnosis not present

## 2022-02-02 ENCOUNTER — Other Ambulatory Visit: Payer: Self-pay

## 2022-02-02 DIAGNOSIS — I350 Nonrheumatic aortic (valve) stenosis: Secondary | ICD-10-CM

## 2022-02-03 ENCOUNTER — Institutional Professional Consult (permissible substitution): Payer: Medicare Other | Admitting: Surgery

## 2022-02-03 VITALS — BP 153/81 | HR 78 | Resp 20 | Ht 66.0 in

## 2022-02-03 DIAGNOSIS — I350 Nonrheumatic aortic (valve) stenosis: Secondary | ICD-10-CM | POA: Diagnosis not present

## 2022-02-03 NOTE — Progress Notes (Signed)
Patient ID: Lindsay [name], female   DOB: 1946/07/21, 76 y.o.   MRN: [id number]  [facility]         [address]       [place] [address]             [phone]          CARDIOTHORACIC SURGERY CONSULTATION REPORT  PCP is Ronita Hipps, MD Referring Provider is Lenna Sciara, MD Primary Cardiologist is Shirlee More, MD  Reason for consultation:  Severe aortic stenosis  HPI:  The patient is a 76 year old woman with a history of hypertension, hyperlipidemia, COPD, chronic atrial fibrillation, breast cancer status post left mastectomy with chemotherapy, and aortic stenosis that has been followed with echocardiogram for the past several years.  She had an echocardiogram in September 2022 showing a mean gradient of 25.5 mmHg with moderate thickening and calcification of the aortic valve.  The valve area by VTI was 0.72 cm.  Stroke-volume index was 20 with a left ventricular ejection fraction of 55 to 60% and grade 2 diastolic dysfunction.  She was thought to have severe low-flow/low gradient aortic stenosis.  She remained asymptomatic but started having symptoms in February 2023.  She initially refused further evaluation but developed progressive exertional fatigue and shortness of breath as well as marked lower extremity edema and was admitted in March 2023 for management of her congestive heart failure with intravenous diuretics.  Her echocardiogram at that time showed a mean gradient of 29 mmHg with a valve area 0.69 cm and dimensionless index of 0.2.  Aortic valve was felt to be trileaflet with severe calcification and thickening and restricted mobility.  Left ventricular ejection fraction was 55 to 60% with mild concentric LVH.  Stroke-volume index was 22.  She underwent cardiac catheterization on 11/23/2021 showing widely patent coronary arteries with no significant stenoses.  There is mild pulmonary hypertension at 54/23  with a mean of 34.  She had marked improvement in her lower extremity edema, orthopnea, and exertional shortness of breath.  She is here today with her daughter and husband.  She denies any significant shortness of breath or chest discomfort.  She has had no dizziness.  She has not as active as she had been but denies any symptoms.  She feels like her energy level is adequate.  Past Medical History:  Diagnosis Date   Antineoplastic chemotherapy induced anemia 11/03/2017   Asymptomatic bacteriuria 01/18/2022   Baker's cyst of knee, left 02/12/2020   Chronic atrial fibrillation ([facility]) 01/18/2022   Chronic diastolic (congestive) heart failure (HCC)    Chronic pain of left knee 02/12/2020   COPD (chronic obstructive pulmonary disease) ([facility]) 01/18/2022   Diverticulitis 01/18/2022   Essential hypertension    History of kidney stones    History of left breast cancer 12/06/2017   Hypercalcemia 01/18/2022   Hyperlipidemia 04/11/2019   Hypoxia 01/18/2022   Malignant neoplasm of upper-inner quadrant of left breast in female, estrogen receptor negative ([facility]) 06/21/2017   Neutropenic fever ([facility]) 01/18/2022   PAF (paroxysmal atrial fibrillation) (HCC)    PAT (paroxysmal atrial tachycardia) ([facility]) 10/30/2016   Personal history of colonic polyps    Personal history of skin cancer    Port-A-Cath in place 07/01/2017   Severe aortic stenosis 11/17/2021   SVT (supraventricular tachycardia) ([facility]) 05/31/2018   Uncontrolled hypertension 01/18/2022    Past Surgical History:  Procedure Laterality Date   COLONOSCOPY  FOOT SURGERY Right    IR CV LINE INJECTION  08/02/2017   left breast lumpectomy  2000   MASTECTOMY WITH AXILLARY LYMPH NODE DISSECTION Left 12/06/2017   Procedure: LEFT MASTECTOMY WITH TARGETED LYMPH NODE DISSECTION;  Surgeon: Coralie Keens, MD;  Location: [facility];  Service: General;  Laterality: Left;   PORT-A-CATH REMOVAL Right 01/13/2018   Procedure: REMOVAL PORT-A-CATH;  Surgeon:  Coralie Keens, MD;  Location: [facility];  Service: General;  Laterality: Right;   PORTACATH PLACEMENT Right 06/30/2017   Procedure: [facility];  Surgeon: Coralie Keens, MD;  Location: [facility];  Service: General;  Laterality: Right;   RIGHT HEART CATH AND CORONARY ANGIOGRAPHY N/A 11/23/2021   Procedure: RIGHT HEART CATH AND CORONARY ANGIOGRAPHY;  Surgeon: Sherren Mocha, MD;  Location: [facility];  Service: Cardiovascular;  Laterality: N/A;    Family History  Problem Relation Age of Onset   Pancreatic cancer Mother 29   Breast cancer Sister 6       bilateral mastectomy   Basal cell carcinoma Sister        'many'   Breast cancer Paternal 80    Breast cancer Sister 59   Melanoma Sister    Colon cancer Sister 26    Social History   Socioeconomic History   Marital status: Married    Spouse name: Not on file   Number of children: Not on file   Years of education: Not on file   Highest education level: Not on file  Occupational History   Not on file  Tobacco Use   Smoking status: Former    Packs/day: 2.00    Years: 50.00    Pack years: 100.00    Types: Cigarettes    Quit date: 06/02/2014    Years since quitting: 7.6    Passive exposure: Past   Smokeless tobacco: Never  Vaping Use   Vaping Use: Never used  Substance and Sexual Activity   Alcohol use: No   Drug use: No   Sexual activity: Yes  Other Topics Concern   Not on file  Social History Narrative   Not on file   Social Determinants of Health   Financial Resource Strain: Not on file  Food Insecurity: Not on file  Transportation Needs: Not on file  Physical Activity: Not on file  Stress: Not on file  Social Connections: Not on file  Intimate Partner Violence: Not on file    Prior to Admission medications   Medication Sig Start Date End Date Taking? Authorizing Provider  acetaminophen (TYLENOL) 500 MG tablet Take 1,000 mg by mouth every 6 (six) hours as  needed for moderate pain or headache.   Yes [provider]  albuterol (VENTOLIN HFA) 108 (90 Base) MCG/ACT inhaler Inhale 2 puffs into the lungs every 4 (four) hours as needed for shortness of breath or wheezing. 09/03/21  Yes [provider]  apixaban (ELIQUIS) 5 MG TABS tablet Take 1 tablet (5 mg total) by mouth 2 (two) times daily. 11/25/21  Yes Bhagat, Bhavinkumar, PA  atorvastatin (LIPITOR) 10 MG tablet Take 10 mg by mouth at bedtime. 01/16/16  Yes [provider]  BREO ELLIPTA 100-25 MCG/ACT AEPB Inhale 1 puff into the lungs daily. 09/10/21  Yes [provider]  furosemide (LASIX) 40 MG tablet Take 1 tablet (40 mg total) by mouth daily. 12/10/21  Yes Richardo Priest, MD  hydrALAZINE (APRESOLINE) 25 MG tablet Take 37.5 mg by mouth 3 (three) times daily.  Yes [provider]  ipratropium-albuterol (DUONEB) 0.5-2.5 (3) MG/3ML SOLN Take 3 mLs by nebulization every 6 (six) hours as needed (shortness of breath). 09/08/21  Yes [provider]  Magnesium 250 MG TABS Take 250 mg by mouth daily.   Yes [provider]  metoprolol tartrate (LOPRESSOR) 25 MG tablet Take 1 tablet (25 mg total) by mouth 2 (two) times daily. 12/10/21  Yes Richardo Priest, MD  montelukast (SINGULAIR) 10 MG tablet Take 10 mg by mouth daily. 01/22/19  Yes [provider]  Omega-3 1000 MG CAPS Take 1,000 mg by mouth 2 (two) times daily.   Yes [provider]  potassium chloride (KLOR-CON) 10 MEQ tablet Take 1 tablet (10 mEq total) by mouth daily. 10/05/21  Yes Richardo Priest, MD    Current Outpatient Medications  Medication Sig Dispense Refill   acetaminophen (TYLENOL) 500 MG tablet Take 1,000 mg by mouth every 6 (six) hours as needed for moderate pain or headache.     albuterol (VENTOLIN HFA) 108 (90 Base) MCG/ACT inhaler Inhale 2 puffs into the lungs every 4 (four) hours as needed for shortness of breath or wheezing.     apixaban (ELIQUIS) 5 MG TABS  tablet Take 1 tablet (5 mg total) by mouth 2 (two) times daily. 60 tablet 6   atorvastatin (LIPITOR) 10 MG tablet Take 10 mg by mouth at bedtime.     BREO ELLIPTA 100-25 MCG/ACT AEPB Inhale 1 puff into the lungs daily.     furosemide (LASIX) 40 MG tablet Take 1 tablet (40 mg total) by mouth daily. 90 tablet 3   hydrALAZINE (APRESOLINE) 25 MG tablet Take 37.5 mg by mouth 3 (three) times daily.     ipratropium-albuterol (DUONEB) 0.5-2.5 (3) MG/3ML SOLN Take 3 mLs by nebulization every 6 (six) hours as needed (shortness of breath).     Magnesium 250 MG TABS Take 250 mg by mouth daily.     metoprolol tartrate (LOPRESSOR) 25 MG tablet Take 1 tablet (25 mg total) by mouth 2 (two) times daily. 180 tablet 3   montelukast (SINGULAIR) 10 MG tablet Take 10 mg by mouth daily.     Omega-3 1000 MG CAPS Take 1,000 mg by mouth 2 (two) times daily.     potassium chloride (KLOR-CON) 10 MEQ tablet Take 1 tablet (10 mEq total) by mouth daily. 90 tablet 3   No current facility-administered medications for this visit.   Facility-Administered Medications Ordered in Other Visits  Medication Dose Route Frequency Provider Last Rate Last Admin   sodium chloride flush (NS) 0.9 % injection 10 mL  10 mL Intravenous PRN Truitt Merle, MD   10 mL at 07/29/17 1310    No Known Allergies    Review of Systems:   General:  normal appetite, + decreased energy, no weight gain, + weight loss, no fever  Cardiac:  no chest pain with exertion, no chest pain at rest, no SOB with  exertion, no resting SOB, no PND, no orthopnea, no palpitations, no arrhythmia, + atrial fibrillation, no LE edema, no dizzy spells, no syncope  Respiratory:  no shortness of breath, no home oxygen, no productive cough, no dry cough, no bronchitis, no wheezing, no hemoptysis, no asthma, no pain with inspiration or cough, no sleep apnea, on CPAP at night  GI:   no difficulty swallowing, no reflux, no frequent heartburn, no hiatal hernia, no abdominal pain, no  constipation, no diarrhea, no hematochezia, no hematemesis, no melena  GU:   no  dysuria,  + frequency, no urinary tract infection, no hematuria,  no kidney stones, no kidney disease  Vascular:  no pain suggestive of claudication, no pain in feet, + leg cramps, + varicose veins, no DVT, no non-healing foot ulcer  Neuro:   no stroke, no TIA's, no seizures, no headaches, no temporary blindness one eye,  no slurred speech, no peripheral neuropathy, no chronic pain, no instability of gait, no memory/cognitive dysfunction  Musculoskeletal: + arthritis, + joint swelling, no myalgias, + difficulty walking, + decreased mobility   Skin:   no rash, no itching, no skin infections, no pressure sores or ulcerations  Psych:   no anxiety, no depression, no nervousness, no unusual recent stress  Eyes:   no blurry vision, no floaters, no recent vision changes, + wears glasses  ENT:   no hearing loss, no loose or painful teeth, + dentures  Hematologic:  no easy bruising, no abnormal bleeding, no clotting disorder, no frequent epistaxis  Endocrine:  no diabetes, does not check CBG's at home     Physical Exam:   BP (!) 153/81 (BP Location: Right Arm, Patient Position: Sitting)   Pulse 78   Resp 20   Ht 5\' 6"  (1.676 m)   SpO2 97% Comment: RA  BMI 44.29 kg/m   General:  Elderly,  frail-appearing  HEENT:  Unremarkable, NCAT, PERLA, EOMI  Neck:   no JVD, no bruits, no adenopathy   Chest:   clear to auscultation, symmetrical breath sounds, no wheezes, no rhonchi   CV:   RRR, 3/6 systolic murmur RSB, no diastolic murmur  Abdomen:  soft, non-tender, no masses   Extremities:  warm, well-perfused, pulses palpable at ankle, mild lower extremity edema  Rectal/GU  Deferred  Neuro:   Grossly non-focal and symmetrical throughout  Skin:   Clean and dry, no rashes, no breakdown  Diagnostic Tests:    ECHOCARDIOGRAM REPORT         Patient Name:   Lindsay [name] Date of Exam: 11/18/2021  Medical Rec #:  [id number]     Height:       67.0 in  Accession #:    [id number]   Weight:       284.2 lb  Date of Birth:  [birth year]     BSA:          2.348 m  Patient Age:    74 years     BP:           164/83 mmHg  Patient Gender: F            HR:           91 bpm.  Exam Location:  Inpatient   Procedure: 2D Echo, Color Doppler and Cardiac Doppler   Indications:    Aortic stenosis I35.0     History:        Patient has prior history of Echocardiogram examinations,  most                  recent 05/22/2021. Signs/Symptoms:Dyspnea; Risk                  Factors:Hypertension and Dyslipidemia.     Sonographer:    Bernadene Person RDCS  Referring Phys: [id number] FAN YE   IMPRESSIONS     1. Moderate to severe aortic stenosis is present. Vmax 3.6 m/s, MG 29  mmHG, AVA 0.69cm2, DI 0.20. Findings may represent paradoxical low flow  low gradient aortic stenosis due to low SV  index (22 cc/m2). Would  recommend an aortic valve calcium score or  TEE for clarification. The aortic valve is tricuspid. There is severe  calcifcation of the aortic valve. There is severe thickening of the aortic  valve. Aortic valve regurgitation is not visualized. Moderate to severe  aortic valve stenosis.   2. Left ventricular ejection fraction, by estimation, is 55 to 60%. The  left ventricle has normal function. The left ventricle has no regional  wall motion abnormalities. There is mild concentric left ventricular  hypertrophy. Left ventricular diastolic  function could not be evaluated.   3. Right ventricular systolic function is normal. The right ventricular  size is normal. There is normal pulmonary artery systolic pressure.   4. Left atrial size was mildly dilated.   5. Right atrial size was mildly dilated.   6. The mitral valve is degenerative. Mild to moderate mitral valve  regurgitation.   7. The inferior vena cava is normal in size with greater than 50%  respiratory variability, suggesting right atrial pressure of 3 mmHg.    Comparison(s): No significant change from prior study.   FINDINGS   Left Ventricle: Left ventricular ejection fraction, by estimation, is 55  to 60%. The left ventricle has normal function. The left ventricle has no  regional wall motion abnormalities. The left ventricular internal cavity  size was normal in size. There is   mild concentric left ventricular hypertrophy. Left ventricular diastolic  function could not be evaluated due to atrial fibrillation. Left  ventricular diastolic function could not be evaluated.   Right Ventricle: The right ventricular size is normal. No increase in  right ventricular wall thickness. Right ventricular systolic function is  normal. There is normal pulmonary artery systolic pressure. The tricuspid  regurgitant velocity is 2.82 m/s, and   with an assumed right atrial pressure of 3 mmHg, the estimated right  ventricular systolic pressure is 24.5 mmHg.   Left Atrium: Left atrial size was mildly dilated.   Right Atrium: Right atrial size was mildly dilated.   Pericardium: Trivial pericardial effusion is present.   Mitral Valve: The mitral valve is degenerative in appearance. Mild to  moderate mitral annular calcification. Mild to moderate mitral valve  regurgitation.   Tricuspid Valve: The tricuspid valve is grossly normal. Tricuspid valve  regurgitation is mild . No evidence of tricuspid stenosis.   Aortic Valve: Moderate to severe aortic stenosis is present. Vmax 3.6 m/s,  MG 29 mmHG, AVA 0.69cm2, DI 0.20. Findings may represent paradoxical low  flow low gradient aortic stenosis due to low SV index (22 cc/m2). Would  recommend an aortic valve calcium  score or TEE for clarification. The aortic valve is tricuspid. There is  severe calcifcation of the aortic valve. There is severe thickening of the  aortic valve. Aortic valve regurgitation is not visualized. Moderate to  severe aortic stenosis is present.  Aortic valve mean gradient measures  29.0 mmHg. Aortic valve peak gradient  measures 50.7 mmHg. Aortic valve area, by VTI measures 0.69 cm.   Pulmonic Valve: The pulmonic valve was grossly normal. Pulmonic valve  regurgitation is mild. No evidence of pulmonic stenosis.   Aorta: The aortic root and ascending aorta are structurally normal, with  no evidence of dilitation.   Venous: The inferior vena cava is normal in size with greater than 50%  respiratory variability, suggesting right atrial pressure of 3 mmHg.   IAS/Shunts: The atrial septum is grossly normal.      LEFT VENTRICLE  PLAX  2D  LVIDd:         4.90 cm  LVIDs:         3.80 cm  LV PW:         1.30 cm  LV IVS:        1.30 cm  LVOT diam:     2.10 cm  LV SV:         51  LV SV Index:   22  LVOT Area:     3.46 cm     LV Volumes (MOD)  LV vol d, MOD A2C: 84.5 ml  LV vol d, MOD A4C: 91.9 ml  LV vol s, MOD A2C: 42.3 ml  LV vol s, MOD A4C: 41.8 ml  LV SV MOD A2C:     42.2 ml  LV SV MOD A4C:     91.9 ml  LV SV MOD BP:      49.2 ml   RIGHT VENTRICLE  RV S prime:     7.63 cm/s  TAPSE (M-mode): 1.7 cm   LEFT ATRIUM             Index        RIGHT ATRIUM           Index  LA diam:        5.00 cm 2.13 cm/m   RA Area:     22.60 cm  LA Vol (A2C):   99.5 ml 42.38 ml/m  RA Volume:   72.80 ml  31.01 ml/m  LA Vol (A4C):   82.9 ml 35.31 ml/m  LA Biplane Vol: 95.2 ml 40.55 ml/m   AORTIC VALVE                     PULMONIC VALVE  AV Area (Vmax):    0.81 cm      PR End Diast Vel: 7.62 msec  AV Area (Vmean):   0.75 cm  AV Area (VTI):     0.69 cm  AV Vmax:           356.00 cm/s  AV Vmean:          252.333 cm/s  AV VTI:            0.746 m  AV Peak Grad:      50.7 mmHg  AV Mean Grad:      29.0 mmHg  LVOT Vmax:         83.48 cm/s  LVOT Vmean:        54.525 cm/s  LVOT VTI:          0.149 m  LVOT/AV VTI ratio: 0.20     AORTA  Ao Root diam: 3.30 cm  Ao Asc diam:  3.40 cm   TRICUSPID VALVE  TR Peak grad:   31.8 mmHg  TR Vmax:        282.00 cm/s      SHUNTS  Systemic VTI:  0.15 m  Systemic Diam: 2.10 cm   Eleonore Chiquito MD  Electronically signed by Eleonore Chiquito MD  Signature Date/Time: 11/18/2021/12:43:14 PM         Final     Physicians Panel Physicians Referring Physician Case Authorizing Physician  Sherren Mocha, MD (Primary)    Procedures RIGHT HEART CATH AND CORONARY ANGIOGRAPHY  Conclusion  Hemodynamic findings consistent with mild pulmonary hypertension.  1. Known severe aortic stenosis by noninvasive assessment 2. Widely patent coronary arteries with minimal irregularity, no significant coronary stenoses 3. Elevated pulmonary capillary  wedge pressure consistent with congestive heart failure/high filling pressures 4. Mild pulmonary hypertension with PA pressure 54/23 mean 34 mmHg, transpulmonary gradient 6 mmHg, PVR less than 1 Woods unit  Recommend:  Continue diuresis (lasix 40 mg IV ordered post-cath x 1)  Continue TAVR evaluation  Resume heparin post-procedure, transition to apixaban pending case manager consult (see notes patient unable to afford apixaban pre-hospital) Indications Severe aortic stenosis [I35.0 (ICD-10-CM)]  Procedural Details Technical Details INDICATION: Stage D3 aortic stenosis complicated by acute diastolic heart failure  PROCEDURAL DETAILS: There was an indwelling IV in a right antecubital vein. Using normal sterile technique, the IV was changed out for a 5 Fr brachial sheath over a 0.018 inch wire. The right wrist was then prepped, draped, and anesthetized with 1% lidocaine. Using the modified Seldinger technique a 5/6 French Slender sheath was placed in the right radial artery. Intra-arterial verapamil was administered through the radial artery sheath. IV heparin was administered after a JR4 catheter was advanced into the central aorta. A Swan-Ganz catheter was used for the right heart catheterization. Standard protocol was followed for recording of right heart pressures and sampling of oxygen  saturations. Fick cardiac output was calculated. Standard Judkins catheters were used for selective coronary angiography. A J-wire directed by the JR4 catheter would not cross the aortic valve. No further attempts were made. There were no immediate procedural complications. The patient was transferred to the post catheterization recovery area for further monitoring.     Estimated blood loss <50 mL.   During this procedure medications were administered to achieve and maintain moderate conscious sedation while the patient's heart rate, blood pressure, and oxygen saturation were continuously monitored and I was present face-to-face 100% of this time.  Medications (Filter: Administrations occurring from 8016 to 0853 on 11/23/21)  important Continuous medications are totaled by the amount administered until 11/23/21 0853.  midazolam (VERSED) injection (mg) Total dose: 2 mg  Date/Time Rate/Dose/Volume Action    11/23/21 0750 1 mg Given   0800 1 mg Given   fentaNYL (SUBLIMAZE) injection (mcg) Total dose: 50 mcg  Date/Time Rate/Dose/Volume Action    11/23/21 0750 25 mcg Given   0800 25 mcg Given   Heparin (Porcine) in NaCl 1000-0.9 UT/500ML-% SOLN (mL) Total volume: 1,000 mL  Date/Time Rate/Dose/Volume Action    11/23/21 0750 500 mL Given   0750 500 mL Given   lidocaine (PF) (XYLOCAINE) 1 % injection (mL) Total volume: 5 mL  Date/Time Rate/Dose/Volume Action    11/23/21 0800 5 mL Given   Radial Cocktail/Verapamil only (mL) Total volume: 10 mL  Date/Time Rate/Dose/Volume Action    11/23/21 0804 10 mL Given   heparin sodium (porcine) injection (Units) Total dose: 6,000 Units  Date/Time Rate/Dose/Volume Action    11/23/21 0813 6,000 Units Given   iohexol (OMNIPAQUE) 350 MG/ML injection (mL) Total volume: 30 mL  Date/Time Rate/Dose/Volume Action    11/23/21 0821 30 mL Given   0.9 % sodium chloride infusion (mL) Total dose: Cannot be calculated* Dosing weight: 128.9   *Administration dose not documented  Date/Time Rate/Dose/Volume Action    11/23/21 0735 *Not included in total MAR Hold   acetaminophen (TYLENOL) tablet 650 mg (mg) Total dose: Cannot be calculated* Dosing weight: 128.9  *Administration dose not documented  Date/Time Rate/Dose/Volume Action    11/23/21 0735 *Not included in total MAR Hold   atorvastatin (LIPITOR) tablet 10 mg (mg) Total dose: Cannot be calculated* Dosing weight: 128.9  *Administration dose not documented  Date/Time Rate/Dose/Volume Action  11/23/21 0735 *Not included in total MAR Hold   docusate sodium (COLACE) capsule 100 mg (mg) Total dose: Cannot be calculated* Dosing weight: 126.4  *Administration dose not documented  Date/Time Rate/Dose/Volume Action    11/23/21 0735 *Not included in total MAR Hold   guaiFENesin-dextromethorphan (ROBITUSSIN DM) 100-10 MG/5ML syrup 15 mL (mL) Total dose: Cannot be calculated* Dosing weight: 126.4  *Administration dose not documented  Date/Time Rate/Dose/Volume Action    11/23/21 0735 *Not included in total MAR Hold   hydrALAZINE (APRESOLINE) tablet 25 mg (mg) Total dose: Cannot be calculated* Dosing weight: 126.9  *Administration dose not documented  Date/Time Rate/Dose/Volume Action    11/23/21 0735 *Not included in total MAR Hold   ipratropium-albuterol (DUONEB) 0.5-2.5 (3) MG/3ML nebulizer solution 3 mL (mL) Total dose: Cannot be calculated* Dosing weight: 128.9  *Administration dose not documented  Date/Time Rate/Dose/Volume Action    11/23/21 0735 *Not included in total MAR Hold   magnesium oxide (MAG-OX) tablet 400 mg (mg) Total dose: Cannot be calculated*  *Administration dose not documented  Date/Time Rate/Dose/Volume Action    11/23/21 0735 *Not included in total MAR Hold   metoprolol tartrate (LOPRESSOR) tablet 25 mg (mg) Total dose: Cannot be calculated* Dosing weight: 126.4  *Administration dose not documented  Date/Time Rate/Dose/Volume Action     11/23/21 0735 *Not included in total MAR Hold   montelukast (SINGULAIR) tablet 10 mg (mg) Total dose: Cannot be calculated*  *Administration dose not documented  Date/Time Rate/Dose/Volume Action    11/23/21 0735 *Not included in total MAR Hold   omega-3 acid ethyl esters (LOVAZA) capsule 1,000 mg (mg) Total dose: Cannot be calculated*  *Administration dose not documented  Date/Time Rate/Dose/Volume Action    11/23/21 0735 *Not included in total MAR Hold   ondansetron (ZOFRAN) injection 4 mg (mg) Total dose: Cannot be calculated* Dosing weight: 128.9  *Administration dose not documented  Date/Time Rate/Dose/Volume Action    11/23/21 0735 *Not included in total MAR Hold   polyethylene glycol (MIRALAX / GLYCOLAX) packet 17 g (g) Total dose: Cannot be calculated* Dosing weight: 126.4  *Administration dose not documented  Date/Time Rate/Dose/Volume Action    11/23/21 0735 *Not included in total MAR Hold   sodium chloride flush (NS) 0.9 % injection 3 mL (mL) Total dose: Cannot be calculated* Dosing weight: 128.9  *Administration dose not documented  Date/Time Rate/Dose/Volume Action    11/23/21 0735 *Not included in total MAR Hold   sodium chloride flush (NS) 0.9 % injection 3 mL (mL) Total dose: Cannot be calculated* Dosing weight: 128.9  *Administration dose not documented  Date/Time Rate/Dose/Volume Action    11/23/21 0735 *Not included in total MAR Hold   sodium chloride flush (NS) 0.9 % injection 3 mL (mL) Total dose: Cannot be calculated* Dosing weight: 125.2  *Administration dose not documented  Date/Time Rate/Dose/Volume Action    11/23/21 0735 *Not included in total MAR Hold   Sedation Time Sedation Time Physician-1: 29 minutes 35 seconds  Contrast Medication Name Total Dose  iohexol (OMNIPAQUE) 350 MG/ML injection 30 mL  Radiation/Fluoro Fluoro time: 4.8 (min)  DAP: 11024 (mGycm2)  Cumulative Air Kerma: 347 (mGy)  Complications Complications documented  before study signed (11/23/2021 4:25 AM)  No complications were associated with this study.  Documented by Bard Herbert, RN - 11/23/2021 8:25 AM  Coronary Findings Diagnostic Dominance: Right  Left Main  The vessel exhibits minimal luminal irregularities.  Left Anterior Descending  The vessel exhibits minimal luminal irregularities. Widely patent LAD to the apex,  patent diagonals, no significant stenoses  Left Circumflex  The vessel exhibits minimal luminal irregularities. The circumflex is widely patent with 2 large OM's with no significant stenoses  Right Coronary Artery  The vessel exhibits minimal luminal irregularities. Widely patent, dominant RCA with no obstructive disease. The PDA and PLA branches are patent with no obstruction.  Intervention  No interventions have been documented.  Right Heart Right Heart Pressures Hemodynamic findings consistent with mild pulmonary hypertension.  Coronary Diagrams Diagnostic Dominance: Right  &&&&&&&&  Intervention Implants  No implant documentation for this case.   Syngo Images Show images for CARDIAC CATHETERIZATION  Images on Long Term Storage Show images for Francena, Zender to Procedure Log   Procedure Log  Hemo Data Flowsheet Row Most Recent Value  Fick Cardiac Output 8.13 L/min  Fick Cardiac Output Index 3.48 (L/min)/BSA  RA A Wave 16 mmHg  RA V Wave 15 mmHg  RA Mean 10 mmHg  RV Systolic Pressure 50 mmHg  RV Diastolic Pressure 5 mmHg  RV EDP 11 mmHg  PA Systolic Pressure 50 mmHg  PA Diastolic Pressure 25 mmHg  PA Mean 39 mmHg  PW A Wave 32 mmHg  PW V Wave 37 mmHg  PW Mean 28 mmHg  AO Systolic Pressure 580 mmHg  AO Diastolic Pressure 73 mmHg  AO Mean 104 mmHg  QP/QS 1  TPVR Index 9.76 HRUI  TSVR Index 29.86 HRUI  PVR SVR Ratio 0.06  TPVR/TSVR Ratio 0.33    ADDENDUM REPORT: 12/28/2021 11:42   CLINICAL DATA:  Aortic valve replacement (TAVR), pre-op eval   EXAM: Cardiac TAVR CT   TECHNIQUE: The patient  was scanned on a Siemens Force [id number] slice scanner. A 120 kV retrospective scan was triggered in the descending thoracic aorta at 111 HU's. Gantry rotation speed was 270 msecs and collimation was .9 mm. The 3D data set was reconstructed in 5% intervals of the R-R cycle. Systolic and diastolic phases were analyzed on a dedicated work station using MPR, MIP and VRT modes. The patient received 114mL OMNIPAQUE IOHEXOL 350 MG/ML SOLN of contrast.   FINDINGS: Image quality: Average   Noise   Aortic Valve: Tricuspid aortic valve. Severely reduced cusp separation. Severely thickened, severely calcified aortic valve cusps.   AV calcium score: 1549   Virtual Basal Annulus Measurements:   Maximum/Minimum Diameter: 28.4 x 22.7 mm   Perimeter: 80.9 mm   Area:  496 mm2   No significant LVOT calcifications.   Based on these measurements, the annulus would be suitable for a 26 mm valve.   Sinus of Valsalva Measurements:   Non-coronary:  33 mm   Right - coronary:  32 mm   Left - coronary:  33 mm   Sinus of Valsalva Height:   Left: 23.8 mm   Right: 22.7 mm   Aorta: 4 vessel branch pattern of aortic arch with the right subclavian artery and right common carotid artery originating directly off aortic arch. Severe aortic atherosclerosis.   Sinotubular Junction:  30 mm   Ascending Thoracic Aorta:  35 mm   Aortic Arch:  28 mm   Descending Thoracic Aorta:  29 mm   Coronary Artery Height above Annulus:   Left main: 16.7 mm   Right coronary: 17.4 mm   Coronary Arteries: Normal coronary origin. Right dominance. The study was performed without use of NTG and insufficient for plaque evaluation.   Optimum Fluoroscopic Angle for Delivery: LAO 3, CAU 4   Moderate mitral annular calcifications.  No left atrial appendage thrombus.   IMPRESSION: 1. Tricuspid aortic valve with severely reduced cusp excursion. Severely thickened and severely calcified aortic valve cusps.   2.   Aortic valve calcium score: 1549   3. Annulus area: 496 mm2, suitable for 26 mm Sapien 3 valve. No LVOT calcifications.   4.  Sufficient coronary artery heights from annulus.   5. Optimum fluoroscopic angle for delivery: LAO 3, CAU 4     Electronically Signed   By: Cherlynn Kaiser M.D.   On: 12/28/2021 11:42    Addended by Elouise Munroe, MD on 12/28/2021 11:45 AM   Study Result  Narrative & Impression  EXAM: OVER-READ INTERPRETATION  CT CHEST   The following report is an over-read performed by radiologist Dr. Yetta Glassman of [facility], [place] on 12/25/2021. This over-read does not include interpretation of cardiac or coronary anatomy or pathology. The coronary calcium score/coronary CTA interpretation by the cardiologist is attached.   COMPARISON:  None.   FINDINGS: Extracardiac findings will be described separately under dictation for contemporaneously obtained CTA chest, abdomen and pelvis.   IMPRESSION: Please see separate dictation for contemporaneously obtained CTA chest, abdomen and pelvis dated 12/25/2021 for full description of relevant extracardiac findings.   Electronically Signed: By: Yetta Glassman M.D. On: 12/25/2021 12:03      Narrative & Impression  CLINICAL DATA:  Preop evaluation for aortic valve replacement   EXAM: CT ANGIOGRAPHY CHEST, ABDOMEN AND PELVIS   TECHNIQUE: Multidetector CT imaging through the chest, abdomen and pelvis was performed using the standard protocol during bolus administration of intravenous contrast. Multiplanar reconstructed images and MIPs were obtained and reviewed to evaluate the vascular anatomy.   RADIATION DOSE REDUCTION: This exam was performed according to the departmental dose-optimization program which includes automated exposure control, adjustment of the mA and/or kV according to patient size and/or use of iterative reconstruction technique.   CONTRAST:  162mL OMNIPAQUE IOHEXOL 350 MG/ML  SOLN   COMPARISON:  Chest, abdomen and pelvis dated November 09, 2021   FINDINGS: CTA CHEST FINDINGS   Cardiovascular: Cardiomegaly. Mitral annular calcifications. Calcifications and thickening of the aortic valve. Moderate atherosclerotic disease of the thoracic aorta. Left main, lad, and RCA calcifications.   Mediastinum/Nodes: Small hiatal hernia. Heterogeneous enlarged left thyroid lobe, unchanged when compared prior exam. No pathologically enlarged lymph nodes seen in the chest.   Lungs/Pleura: Central airways are patent. Centrilobular emphysema. No consolidation, pleural effusion or pneumothorax.   Musculoskeletal: No chest wall abnormality. No acute or significant osseous findings.   CTA ABDOMEN AND PELVIS FINDINGS   Hepatobiliary: No focal liver abnormality is seen. No gallstones, gallbladder wall thickening, or biliary dilatation.   Pancreas: Unremarkable. No pancreatic ductal dilatation or surrounding inflammatory changes.   Spleen: Normal in size without focal abnormality.   Adrenals/Urinary Tract: Bilateral adrenal glands are unremarkable. Bilateral low-attenuation renal lesions, largest are compatible simple cysts, unchanged when compared with prior exam.   Stomach/Bowel: Stomach is within normal limits. Diverticulosis. No evidence of bowel wall thickening, distention, or inflammatory changes.   Vascular/lymphatic: Normal caliber abdominal aorta with severe atherosclerotic disease. Aortic branch vessels are patent. Moderate narrowing at the origin of the celiac artery due to calcified plaque and mild narrowing at the origin of the SMA, bilateral renal arteries and SMV due to calcified plaque.   Reproductive: Uterus and bilateral adnexa are unremarkable.   Other: Prior left mastectomy. Small fat containing umbilical hernia. No abdominopelvic ascites.   Musculoskeletal: No acute or significant osseous  findings.   VASCULAR MEASUREMENTS PERTINENT TO TAVR:    AORTA:   Minimal Aortic Diameter-10.9 mm   Severity of Aortic Calcification-severe   RIGHT PELVIS:   Right Common Iliac Artery -   Minimal Diameter-4.0 mm   Tortuosity-mild   Calcification-severe   Right External Iliac Artery -   Minimal Diameter-6.5 mm   Tortuosity-mild   Calcification-mild   Right Common Femoral Artery -   Minimal Diameter-0.8 mm   Tortuosity-none   Calcification-moderate   LEFT PELVIS:   Left Common Iliac Artery -   Minimal Diameter-5.3 mm   Tortuosity-none   Calcification-severe   Left External Iliac Artery -   Minimal Diameter-5.4 mm   Tortuosity-mild   Calcification-mild   Left Common Femoral Artery -   Minimal Diameter-10.9 mm   Tortuosity-none   Calcification-moderate   Review of the MIP images confirms the above findings.   IMPRESSION: 1. Vascular findings and measurements pertinent to potential TAVR procedure, as detailed above. 2. Severe thickening calcification of the aortic valve, compatible with reported clinical history of severe aortic stenosis. 3. Moderate to severe aortoiliac atherosclerosis. Left main and 2 vessel coronary artery disease.     Electronically Signed   By: Yetta Glassman M.D.   On: 12/25/2021 12:50     Impression:  This 76 year old woman has stage D3, severe, symptomatic low-flow/low gradient aortic stenosis with New York Heart Association class II symptoms of exertional fatigue and shortness of breath consistent with chronic diastolic congestive heart failure.  She was admitted in March with class IV symptoms that improved with intravenous diuresis and maintenance oral diuretic.  I have personally reviewed her 2D echocardiogram, cardiac catheterization, and CTA studies.  Her echocardiogram shows a heavily calcified and thickened aortic valve with restricted leaflet mobility.  The mean gradient was measured at 29 mmHg with a valve area 0.69 cm and dimensionless index of 0.2.  Left  ventricular ejection fraction is normal with a low stroke-volume index of 22.  Cardiac catheterization shows patent coronary arteries with no significant stenoses.  I agree that aortic valve replacement is indicated in this patient for relief of her symptoms and to prevent left ventricular deterioration and further admissions for congestive heart failure.  Given her age and comorbid risk factors I think that transcatheter aortic valve replacement would be the best treatment for her.  Her gated cardiac CTA shows anatomy suitable for TAVR using a SAPIEN 3 valve.  Her abdominal and pelvic CTA shows moderate to severe aortoiliac atherosclerosis but there appears to be adequate pelvic vascular anatomy to allow transfemoral insertion.  The patient and her family were counseled at length regarding treatment alternatives for management of severe symptomatic aortic stenosis. The risks and benefits of surgical intervention has been discussed in detail. Long-term prognosis with medical therapy was discussed. Alternative approaches such as conventional surgical aortic valve replacement, transcatheter aortic valve replacement, and palliative medical therapy were compared and contrasted at length. This discussion was placed in the context of the patient's own specific clinical presentation and past medical history. All of their questions have been addressed.   Following the decision to proceed with transcatheter aortic valve replacement, a discussion was held regarding what types of management strategies would be attempted intraoperatively in the event of life-threatening complications, including whether or not the patient would be considered a candidate for the use of cardiopulmonary bypass and/or conversion to open sternotomy for attempted surgical intervention.  Given her age, morbid obesity, and other comorbid risk factors I do not  think she is a candidate for emergent sternotomy to manage any intraoperative  complications.  The patient is aware of the fact that transient use of cardiopulmonary bypass may be necessary. The patient has been advised of a variety of complications that might develop including but not limited to risks of death, stroke, paravalvular leak, aortic dissection or other major vascular complications, aortic annulus rupture, device embolization, cardiac rupture or perforation, mitral regurgitation, acute myocardial infarction, arrhythmia, heart block or bradycardia requiring permanent pacemaker placement, congestive heart failure, respiratory failure, renal failure, pneumonia, infection, other late complications related to structural valve deterioration or migration, or other complications that might ultimately cause a temporary or permanent loss of functional independence or other long term morbidity. The patient provides full informed consent for the procedure as described and all questions were answered.      Plan:  She will be scheduled for transfemoral TAVR on Tuesday, 02/09/2022.  I spent 60 minutes performing this consultation and > 50% of this time was spent face to face counseling and coordinating the care of this patient's severe symptomatic aortic stenosis.  Gaye Pollack, MD 02/03/2022

## 2022-02-03 NOTE — Progress Notes (Signed)
Pre Surgical Assessment: 5 M Walk Test  28M=16.23ft  5 Meter Walk Test- trial 1: 8.48 seconds 5 Meter Walk Test- trial 2: 9.12 seconds 5 Meter Walk Test- trial 3: 11.29 seconds 5 Meter Walk Test Average: 9.63 seconds

## 2022-02-04 NOTE — Pre-Procedure Instructions (Signed)
Surgical Instructions    Your procedure is scheduled on Tuesday, June 13th.  Report to [facility] Main Entrance "A" at 07:15 A.M., then check in with the Admitting office.  Call this number if you have problems the morning of surgery:  [phone]   If you have any questions prior to your surgery date call [phone]: Open Monday-Friday 8am-4pm    Remember:  Do not eat or drink after midnight the night before your surgery     STOP now taking any Aspirin (unless otherwise instructed by your surgeon), Aleve, Naproxen, Ibuprofen, Motrin, Advil, Goody's, BC's, all herbal medications, fish oil, and all non-prescription vitamins.   Stop taking Eliquis on Thursday, June 8th. You will take your last dose on Wednesday, June 7th.     Continue taking all other medications without change through the day before surgery. On the morning of surgery do not take any medications. You can use inhaler if needed.                      Do NOT Smoke (Tobacco/Vaping) for 24 hours prior to your procedure.  If you use a CPAP at night, you may bring your mask/headgear for your overnight stay.   Contacts, glasses, piercing's, hearing aid's, dentures or partials may not be worn into surgery, please bring cases for these belongings.    For patients admitted to the hospital, discharge time will be determined by your treatment team.   Patients discharged the day of surgery will not be allowed to drive home, and someone needs to stay with them for 24 hours.  SURGICAL WAITING ROOM VISITATION Patients having surgery or a procedure may have two support people in the waiting room. These visitors may be switched out with other visitors if needed. Children under the age of 52 must have an adult accompany them who is not the patient. If the patient needs to stay at the hospital during part of their recovery, the visitor guidelines for inpatient rooms apply.  Please refer to the [facility] website for the visitor  guidelines for Inpatients (after your surgery is over and you are in a regular room).    Special instructions:   [facility]- Preparing For Surgery  Before surgery, you can play an important role. Because skin is not sterile, your skin needs to be as free of germs as possible. You can reduce the number of germs on your skin by washing with CHG (chlorahexidine gluconate) Soap before surgery.  CHG is an antiseptic cleaner which kills germs and bonds with the skin to continue killing germs even after washing.    Oral Hygiene is also important to reduce your risk of infection.  Remember - BRUSH YOUR TEETH THE MORNING OF SURGERY WITH YOUR REGULAR TOOTHPASTE  Please do not use if you have an allergy to CHG or antibacterial soaps. If your skin becomes reddened/irritated stop using the CHG.  Do not shave (including legs and underarms) for at least 48 hours prior to first CHG shower. It is OK to shave your face.  Please follow these instructions carefully.   Shower the NIGHT BEFORE SURGERY and the MORNING OF SURGERY  If you chose to wash your hair, wash your hair first as usual with your normal shampoo.  After you shampoo, rinse your hair and body thoroughly to remove the shampoo.  Use CHG Soap as you would any other liquid soap. You can apply CHG directly to the skin and wash gently with a scrungie or  a clean washcloth.   Apply the CHG Soap to your body ONLY FROM THE NECK DOWN.  Do not use on open wounds or open sores. Avoid contact with your eyes, ears, mouth and genitals (private parts). Wash Face and genitals (private parts)  with your normal soap.   Wash thoroughly, paying special attention to the area where your surgery will be performed.  Thoroughly rinse your body with warm water from the neck down.  DO NOT shower/wash with your normal soap after using and rinsing off the CHG Soap.  Pat yourself dry with a CLEAN TOWEL.  Wear CLEAN PAJAMAS to bed the night before surgery  Place CLEAN  SHEETS on your bed the night before your surgery  DO NOT SLEEP WITH PETS.   Day of Surgery: Take a shower with CHG soap. Do not wear jewelry or makeup Do not wear lotions, powders, perfumes, or deodorant. Do not shave 48 hours prior to surgery.   Do not bring valuables to the hospital.  [facility] is not responsible for any belongings or valuables. Do not wear nail polish, gel polish, artificial nails, or any other type of covering on natural nails (fingers and toes) If you have artificial nails or gel coating that need to be removed by a nail salon, please have this removed prior to surgery. Artificial nails or gel coating may interfere with anesthesia's ability to adequately monitor your vital signs. Wear Clean/Comfortable clothing the morning of surgery Remember to brush your teeth WITH YOUR REGULAR TOOTHPASTE.   Please read over the following fact sheets that you were given.    If you received a COVID test during your pre-op visit  it is requested that you wear a mask when out in public, stay away from anyone that may not be feeling well and notify your surgeon if you develop symptoms. If you have been in contact with anyone that has tested positive in the last 10 days please notify you surgeon.

## 2022-02-05 ENCOUNTER — Encounter (HOSPITAL_COMMUNITY)
Admission: RE | Admit: 2022-02-05 | Discharge: 2022-02-05 | Disposition: A | Discharge status: Home / Self Care | Payer: Medicare Other | Source: Ambulatory Visit | Attending: Cardiovascular Disease | Admitting: Cardiovascular Disease

## 2022-02-05 ENCOUNTER — Ambulatory Visit (HOSPITAL_COMMUNITY)
Admission: RE | Admit: 2022-02-05 | Discharge: 2022-02-05 | Disposition: A | Discharge status: Home / Self Care | Payer: Medicare Other | Source: Ambulatory Visit | Attending: Cardiovascular Disease | Admitting: Cardiovascular Disease

## 2022-02-05 ENCOUNTER — Encounter (HOSPITAL_COMMUNITY): Payer: Self-pay

## 2022-02-05 ENCOUNTER — Other Ambulatory Visit: Payer: Self-pay

## 2022-02-05 VITALS — BP 143/87 | HR 84 | Temp 98.2°F | Resp 20 | Ht 66.0 in | Wt 273.7 lb

## 2022-02-05 DIAGNOSIS — Z01818 Encounter for other preprocedural examination: Secondary | ICD-10-CM | POA: Diagnosis not present

## 2022-02-05 DIAGNOSIS — I350 Nonrheumatic aortic (valve) stenosis: Secondary | ICD-10-CM | POA: Diagnosis not present

## 2022-02-05 DIAGNOSIS — Z20822 Contact with and (suspected) exposure to covid-19: Secondary | ICD-10-CM | POA: Insufficient documentation

## 2022-02-05 HISTORY — DX: Unspecified osteoarthritis, unspecified site: M19.90

## 2022-02-05 LAB — CBC
HCT: 44 % (ref 36.0–46.0)
Hemoglobin: 14.1 g/dL (ref 12.0–15.0)
MCH: 30.4 pg (ref 26.0–34.0)
MCHC: 32 g/dL (ref 30.0–36.0)
MCV: 94.8 fL (ref 80.0–100.0)
Platelets: 229 10*3/uL (ref 150–400)
RBC: 4.64 MIL/uL (ref 3.87–5.11)
RDW: 13.8 % (ref 11.5–15.5)
WBC: 10.5 10*3/uL (ref 4.0–10.5)
nRBC: 0 % (ref 0.0–0.2)

## 2022-02-05 LAB — URINALYSIS, ROUTINE W REFLEX MICROSCOPIC
Bilirubin Urine: NEGATIVE
Glucose, UA: NEGATIVE mg/dL
Hgb urine dipstick: NEGATIVE
Ketones, ur: NEGATIVE mg/dL
Nitrite: NEGATIVE
Protein, ur: NEGATIVE mg/dL
Specific Gravity, Urine: 1.005 (ref 1.005–1.030)
pH: 6 (ref 5.0–8.0)

## 2022-02-05 LAB — COMPREHENSIVE METABOLIC PANEL
ALT: 20 U/L (ref 0–44)
AST: 26 U/L (ref 15–41)
Albumin: 4 g/dL (ref 3.5–5.0)
Alkaline Phosphatase: 46 U/L (ref 38–126)
Anion gap: 10 (ref 5–15)
BUN: 28 mg/dL — ABNORMAL HIGH (ref 8–23)
CO2: 26 mmol/L (ref 22–32)
Calcium: 9.2 mg/dL (ref 8.9–10.3)
Chloride: 103 mmol/L (ref 98–111)
Creatinine, Ser: 1.81 mg/dL — ABNORMAL HIGH (ref 0.44–1.00)
GFR, Estimated: 28 mL/min — ABNORMAL LOW (ref >60)
Glucose, Bld: 93 mg/dL (ref 70–99)
Potassium: 3.5 mmol/L (ref 3.5–5.1)
Sodium: 139 mmol/L (ref 135–145)
Total Bilirubin: 0.8 mg/dL (ref 0.3–1.2)
Total Protein: 7.5 g/dL (ref 6.5–8.1)

## 2022-02-05 LAB — TYPE AND SCREEN
ABO/RH(D): A POS
Antibody Screen: NEGATIVE

## 2022-02-05 LAB — SARS CORONAVIRUS 2 (TAT 6-24 HRS): SARS Coronavirus 2: NEGATIVE

## 2022-02-05 LAB — SURGICAL PCR SCREEN
MRSA, PCR: NEGATIVE
Staphylococcus aureus: NEGATIVE

## 2022-02-05 LAB — PROTIME-INR
INR: 1.2 (ref 0.8–1.2)
Prothrombin Time: 14.9 seconds (ref 11.4–15.2)

## 2022-02-05 NOTE — Progress Notes (Signed)
PCP - [facility] Cardiologist - Dr. Bettina Gavia  PPM/ICD - Denies Device Orders -  Rep Notified -   Chest x-ray - 02/05/22 EKG - 02/05/22 Stress Test - 04/17/15 ECHO - 11/18/21 Cardiac Cath - 11/23/21  Sleep Study - Denies   DM - Denies  Blood Thinner Instructions:per patient surgeon has instructed her to hold Eliquis since last Thursday 01/28/22  COVID TEST- 02/05/22   Anesthesia review: Yes cardiac history   Patient denies shortness of breath, fever, cough and chest pain at PAT appointment   All instructions explained to the patient, with a verbal understanding of the material. Patient agrees to go over the instructions while at home for a better understanding. Patient also instructed to wear a mask while in public and avoid anyone sick after being tested for COVID-19. The opportunity to ask questions was provided.

## 2022-02-05 NOTE — Progress Notes (Addendum)
Sent staff message to Dr. Burt Knack regarding patient's UA results.  Also sent secure chat to [place] and left voice message with Manuela Schwartz.   Secure chat sent to [employer]

## 2022-02-08 MED ORDER — MAGNESIUM SULFATE 50 % IJ SOLN
40.0000 meq | INTRAMUSCULAR | Status: DC
  Filled 2022-02-08: qty 9.85

## 2022-02-08 MED ORDER — CEFAZOLIN IN SODIUM CHLORIDE 3-0.9 GM/100ML-% IV SOLN
3.0000 g | INTRAVENOUS | Status: AC
  Administered 2022-02-09: 3 g via INTRAVENOUS
  Filled 2022-02-08: qty 100

## 2022-02-08 MED ORDER — NOREPINEPHRINE 4 MG/250ML-% IV SOLN
0.0000 ug/min | INTRAVENOUS | Status: AC
  Administered 2022-02-09: 2 ug/min via INTRAVENOUS
  Filled 2022-02-08: qty 250

## 2022-02-08 MED ORDER — POTASSIUM CHLORIDE 2 MEQ/ML IV SOLN
80.0000 meq | INTRAVENOUS | Status: DC
  Filled 2022-02-08: qty 40

## 2022-02-08 MED ORDER — DEXMEDETOMIDINE HCL IN NACL 400 MCG/100ML IV SOLN
0.1000 ug/kg/h | INTRAVENOUS | Status: AC
  Administered 2022-02-09: 124.2 ug via INTRAVENOUS
  Administered 2022-02-09: 1 ug/kg/h via INTRAVENOUS
  Filled 2022-02-08: qty 100

## 2022-02-08 MED ORDER — HEPARIN 30,000 UNITS/1000 ML (OHS) CELLSAVER SOLUTION
Status: DC
  Filled 2022-02-08: qty 1000

## 2022-02-08 NOTE — H&P (Signed)
[id number]       [place] [address]             [phone]      Cardiothoracic Surgery Admission History and Physical  PCP is Ronita Hipps, MD Referring Provider is Lenna Sciara, MD Primary Cardiologist is Shirlee More, MD   Reason for admission:  Severe aortic stenosis   HPI:   The patient is a 76 year old woman with a history of hypertension, hyperlipidemia, COPD, chronic atrial fibrillation, breast cancer status post left mastectomy with chemotherapy, and aortic stenosis that has been followed with echocardiogram for the past several years.  She had an echocardiogram in September 2022 showing a mean gradient of 25.5 mmHg with moderate thickening and calcification of the aortic valve.  The valve area by VTI was 0.72 cm.  Stroke-volume index was 20 with a left ventricular ejection fraction of 55 to 60% and grade 2 diastolic dysfunction.  She was thought to have severe low-flow/low gradient aortic stenosis.  She remained asymptomatic but started having symptoms in February 2023.  She initially refused further evaluation but developed progressive exertional fatigue and shortness of breath as well as marked lower extremity edema and was admitted in March 2023 for management of her congestive heart failure with intravenous diuretics.  Her echocardiogram at that time showed a mean gradient of 29 mmHg with a valve area 0.69 cm and dimensionless index of 0.2.  Aortic valve was felt to be trileaflet with severe calcification and thickening and restricted mobility.  Left ventricular ejection fraction was 55 to 60% with mild concentric LVH.  Stroke-volume index was 22.  She underwent cardiac catheterization on 11/23/2021 showing widely patent coronary arteries with no significant stenoses.  There is mild pulmonary hypertension at 54/23 with a mean of 34.  She had marked improvement in her lower extremity edema, orthopnea, and exertional shortness of breath.   She lives with  her husband.  She denies any significant shortness of breath or chest discomfort.  She has had no dizziness.  She has not as active as she had been but denies any symptoms.  She feels like her energy level is adequate.       Past Medical History:  Diagnosis Date   Antineoplastic chemotherapy induced anemia 11/03/2017   Asymptomatic bacteriuria 01/18/2022   Baker's cyst of knee, left 02/12/2020   Chronic atrial fibrillation ([facility]) 01/18/2022   Chronic diastolic (congestive) heart failure (HCC)     Chronic pain of left knee 02/12/2020   COPD (chronic obstructive pulmonary disease) ([facility]) 01/18/2022   Diverticulitis 01/18/2022   Essential hypertension     History of kidney stones     History of left breast cancer 12/06/2017   Hypercalcemia 01/18/2022   Hyperlipidemia 04/11/2019   Hypoxia 01/18/2022   Malignant neoplasm of upper-inner quadrant of left breast in female, estrogen receptor negative ([facility]) 06/21/2017   Neutropenic fever ([facility]) 01/18/2022   PAF (paroxysmal atrial fibrillation) (HCC)     PAT (paroxysmal atrial tachycardia) ([facility]) 10/30/2016   Personal history of colonic polyps     Personal history of skin cancer     Port-A-Cath in place 07/01/2017   Severe aortic stenosis 11/17/2021   SVT (supraventricular tachycardia) ([facility]) 05/31/2018   Uncontrolled hypertension 01/18/2022           Past Surgical History:  Procedure Laterality Date   COLONOSCOPY       FOOT SURGERY Right     IR CV LINE INJECTION  08/02/2017   left breast lumpectomy   2000   MASTECTOMY WITH AXILLARY LYMPH NODE DISSECTION Left 12/06/2017    Procedure: LEFT MASTECTOMY WITH TARGETED LYMPH NODE DISSECTION;  Surgeon: Coralie Keens, MD;  Location: [facility];  Service: General;  Laterality: Left;   PORT-A-CATH REMOVAL Right 01/13/2018    Procedure: REMOVAL PORT-A-CATH;  Surgeon: Coralie Keens, MD;  Location: [facility];  Service: General;  Laterality: Right;   PORTACATH PLACEMENT Right 06/30/2017     Procedure: [place];  Surgeon: Coralie Keens, MD;  Location: [facility];  Service: General;  Laterality: Right;   RIGHT HEART CATH AND CORONARY ANGIOGRAPHY N/A 11/23/2021    Procedure: RIGHT HEART CATH AND CORONARY ANGIOGRAPHY;  Surgeon: Sherren Mocha, MD;  Location: [facility];  Service: Cardiovascular;  Laterality: N/A;           Family History  Problem Relation Age of Onset   Pancreatic cancer Mother 69   Breast cancer Sister 54        bilateral mastectomy   Basal cell carcinoma Sister          'many'   Breast cancer Paternal 72     Breast cancer Sister 62   Melanoma Sister     Colon cancer Sister 49      Social History         Socioeconomic History   Marital status: Married      Spouse name: Not on file   Number of children: Not on file   Years of education: Not on file   Highest education level: Not on file  Occupational History   Not on file  Tobacco Use   Smoking status: Former      Packs/day: 2.00      Years: 50.00      Pack years: 100.00      Types: Cigarettes      Quit date: 06/02/2014      Years since quitting: 7.6      Passive exposure: Past   Smokeless tobacco: Never  Vaping Use   Vaping Use: Never used  Substance and Sexual Activity   Alcohol use: No   Drug use: No   Sexual activity: Yes  Other Topics Concern   Not on file  Social History Narrative   Not on file    Social Determinants of Health    Financial Resource Strain: Not on file  Food Insecurity: Not on file  Transportation Needs: Not on file  Physical Activity: Not on file  Stress: Not on file  Social Connections: Not on file  Intimate Partner Violence: Not on file             Prior to Admission medications   Medication Sig Start Date End Date Taking? Authorizing Provider  acetaminophen (TYLENOL) 500 MG tablet Take 1,000 mg by mouth every 6 (six) hours as needed for moderate pain or headache.     Yes [provider]  albuterol  (VENTOLIN HFA) 108 (90 Base) MCG/ACT inhaler Inhale 2 puffs into the lungs every 4 (four) hours as needed for shortness of breath or wheezing. 09/03/21   Yes [provider]  apixaban (ELIQUIS) 5 MG TABS tablet Take 1 tablet (5 mg total) by mouth 2 (two) times daily. 11/25/21   Yes Bhagat, Bhavinkumar, PA  atorvastatin (LIPITOR) 10 MG tablet Take 10 mg by mouth at bedtime. 01/16/16   Yes [provider]  BREO ELLIPTA 100-25 MCG/ACT AEPB Inhale 1 puff into  the lungs daily. 09/10/21   Yes [provider]  furosemide (LASIX) 40 MG tablet Take 1 tablet (40 mg total) by mouth daily. 12/10/21   Yes Richardo Priest, MD  hydrALAZINE (APRESOLINE) 25 MG tablet Take 37.5 mg by mouth 3 (three) times daily.     Yes [provider]  ipratropium-albuterol (DUONEB) 0.5-2.5 (3) MG/3ML SOLN Take 3 mLs by nebulization every 6 (six) hours as needed (shortness of breath). 09/08/21   Yes [provider]  Magnesium 250 MG TABS Take 250 mg by mouth daily.     Yes [provider]  metoprolol tartrate (LOPRESSOR) 25 MG tablet Take 1 tablet (25 mg total) by mouth 2 (two) times daily. 12/10/21   Yes Richardo Priest, MD  montelukast (SINGULAIR) 10 MG tablet Take 10 mg by mouth daily. 01/22/19   Yes [provider]  Omega-3 1000 MG CAPS Take 1,000 mg by mouth 2 (two) times daily.     Yes [provider]  potassium chloride (KLOR-CON) 10 MEQ tablet Take 1 tablet (10 mEq total) by mouth daily. 10/05/21   Yes Richardo Priest, MD            Current Outpatient Medications  Medication Sig Dispense Refill   acetaminophen (TYLENOL) 500 MG tablet Take 1,000 mg by mouth every 6 (six) hours as needed for moderate pain or headache.       albuterol (VENTOLIN HFA) 108 (90 Base) MCG/ACT inhaler Inhale 2 puffs into the lungs every 4 (four) hours as needed for shortness of breath or wheezing.       apixaban (ELIQUIS) 5 MG TABS tablet Take 1 tablet (5 mg total) by mouth 2 (two) times  daily. 60 tablet 6   atorvastatin (LIPITOR) 10 MG tablet Take 10 mg by mouth at bedtime.       BREO ELLIPTA 100-25 MCG/ACT AEPB Inhale 1 puff into the lungs daily.       furosemide (LASIX) 40 MG tablet Take 1 tablet (40 mg total) by mouth daily. 90 tablet 3   hydrALAZINE (APRESOLINE) 25 MG tablet Take 37.5 mg by mouth 3 (three) times daily.       ipratropium-albuterol (DUONEB) 0.5-2.5 (3) MG/3ML SOLN Take 3 mLs by nebulization every 6 (six) hours as needed (shortness of breath).       Magnesium 250 MG TABS Take 250 mg by mouth daily.       metoprolol tartrate (LOPRESSOR) 25 MG tablet Take 1 tablet (25 mg total) by mouth 2 (two) times daily. 180 tablet 3   montelukast (SINGULAIR) 10 MG tablet Take 10 mg by mouth daily.       Omega-3 1000 MG CAPS Take 1,000 mg by mouth 2 (two) times daily.       potassium chloride (KLOR-CON) 10 MEQ tablet Take 1 tablet (10 mEq total) by mouth daily. 90 tablet 3    No current facility-administered medications for this visit.             Facility-Administered Medications Ordered in Other Visits  Medication Dose Route Frequency Provider Last Rate Last Admin   sodium chloride flush (NS) 0.9 % injection 10 mL  10 mL Intravenous PRN Truitt Merle, MD   10 mL at 07/29/17 1310      No Known Allergies       Review of Systems:               General:  normal appetite, + decreased energy, no weight gain, + weight loss, no fever             Cardiac:                       no chest pain with exertion, no chest pain at rest, no SOB with  exertion, no resting SOB, no PND, no orthopnea, no palpitations, no arrhythmia, + atrial fibrillation, no LE edema, no dizzy spells, no syncope             Respiratory:                 no shortness of breath, no home oxygen, no productive cough, no dry cough, no bronchitis, no wheezing, no hemoptysis, no asthma, no pain with inspiration or cough, no sleep apnea, on CPAP at night             GI:                                no difficulty swallowing, no reflux, no frequent heartburn, no hiatal hernia, no abdominal pain, no constipation, no diarrhea, no hematochezia, no hematemesis, no melena             GU:                              no dysuria,  + frequency, no urinary tract infection, no hematuria,  no kidney stones, no kidney disease             Vascular:                     no pain suggestive of claudication, no pain in feet, + leg cramps, + varicose veins, no DVT, no non-healing foot ulcer             Neuro:                         no stroke, no TIA's, no seizures, no headaches, no temporary blindness one eye,  no slurred speech, no peripheral neuropathy, no chronic pain, no instability of gait, no memory/cognitive dysfunction             Musculoskeletal:         + arthritis, + joint swelling, no myalgias, + difficulty walking, + decreased mobility              Skin:                            no rash, no itching, no skin infections, no pressure sores or ulcerations             Psych:                         no anxiety, no depression, no nervousness, no unusual recent stress             Eyes:                           no blurry vision, no floaters, no recent vision changes, + wears glasses             ENT:  no hearing loss, no loose or painful teeth, + dentures             Hematologic:               no easy bruising, no abnormal bleeding, no clotting disorder, no frequent epistaxis             Endocrine:                   no diabetes, does not check CBG's at home                            Physical Exam:               BP (!) 153/81 (BP Location: Right Arm, Patient Position: Sitting)   Pulse 78   Resp 20   Ht 5\' 6"  (1.676 m)   SpO2 97% Comment: RA  BMI 44.29 kg/m              General:                      Elderly,  frail-appearing             HEENT:                       Unremarkable, NCAT, PERLA, EOMI             Neck:                           no JVD, no bruits, no adenopathy               Chest:                          clear to auscultation, symmetrical breath sounds, no wheezes, no rhonchi              CV:                              RRR, 3/6 systolic murmur RSB, no diastolic murmur             Abdomen:                    soft, non-tender, no masses              Extremities:                 warm, well-perfused, pulses palpable at ankle, mild lower extremity edema             Rectal/GU                   Deferred             Neuro:                         Grossly non-focal and symmetrical throughout             Skin:                            Clean and dry, no rashes, no breakdown   Diagnostic Tests:     ECHOCARDIOGRAM REPORT  Patient Name:   SHAKEISHA HORINE Date of Exam: 11/18/2021  Medical Rec #:  [id number]    Height:       67.0 in  Accession #:    [id number]   Weight:       284.2 lb  Date of Birth:  August 21, 1946     BSA:          2.348 m  Patient Age:    28 years     BP:           164/83 mmHg  Patient Gender: F            HR:           91 bpm.  Exam Location:  Inpatient   Procedure: 2D Echo, Color Doppler and Cardiac Doppler   Indications:    Aortic stenosis I35.0     History:        Patient has prior history of Echocardiogram examinations,  most                  recent 05/22/2021. Signs/Symptoms:Dyspnea; Risk                  Factors:Hypertension and Dyslipidemia.     Sonographer:    Bernadene Person RDCS  Referring Phys: [id number] FAN YE   IMPRESSIONS     1. Moderate to severe aortic stenosis is present. Vmax 3.6 m/s, MG 29  mmHG, AVA 0.69cm2, DI 0.20. Findings may represent paradoxical low flow  low gradient aortic stenosis due to low SV index (22 cc/m2). Would  recommend an aortic valve calcium score or  TEE for clarification. The aortic valve is tricuspid. There is severe  calcifcation of the aortic valve. There is severe thickening of the aortic  valve. Aortic valve regurgitation is not visualized. Moderate to severe  aortic valve stenosis.    2. Left ventricular ejection fraction, by estimation, is 55 to 60%. The  left ventricle has normal function. The left ventricle has no regional  wall motion abnormalities. There is mild concentric left ventricular  hypertrophy. Left ventricular diastolic  function could not be evaluated.   3. Right ventricular systolic function is normal. The right ventricular  size is normal. There is normal pulmonary artery systolic pressure.   4. Left atrial size was mildly dilated.   5. Right atrial size was mildly dilated.   6. The mitral valve is degenerative. Mild to moderate mitral valve  regurgitation.   7. The inferior vena cava is normal in size with greater than 50%  respiratory variability, suggesting right atrial pressure of 3 mmHg.   Comparison(s): No significant change from prior study.   FINDINGS   Left Ventricle: Left ventricular ejection fraction, by estimation, is 55  to 60%. The left ventricle has normal function. The left ventricle has no  regional wall motion abnormalities. The left ventricular internal cavity  size was normal in size. There is   mild concentric left ventricular hypertrophy. Left ventricular diastolic  function could not be evaluated due to atrial fibrillation. Left  ventricular diastolic function could not be evaluated.   Right Ventricle: The right ventricular size is normal. No increase in  right ventricular wall thickness. Right ventricular systolic function is  normal. There is normal pulmonary artery systolic pressure. The tricuspid  regurgitant velocity is 2.82 m/s, and   with an assumed right atrial pressure of 3 mmHg, the estimated right  ventricular systolic pressure is 10.6 mmHg.   Left Atrium: Left atrial  size was mildly dilated.   Right Atrium: Right atrial size was mildly dilated.   Pericardium: Trivial pericardial effusion is present.   Mitral Valve: The mitral valve is degenerative in appearance. Mild to  moderate mitral annular  calcification. Mild to moderate mitral valve  regurgitation.   Tricuspid Valve: The tricuspid valve is grossly normal. Tricuspid valve  regurgitation is mild . No evidence of tricuspid stenosis.   Aortic Valve: Moderate to severe aortic stenosis is present. Vmax 3.6 m/s,  MG 29 mmHG, AVA 0.69cm2, DI 0.20. Findings may represent paradoxical low  flow low gradient aortic stenosis due to low SV index (22 cc/m2). Would  recommend an aortic valve calcium  score or TEE for clarification. The aortic valve is tricuspid. There is  severe calcifcation of the aortic valve. There is severe thickening of the  aortic valve. Aortic valve regurgitation is not visualized. Moderate to  severe aortic stenosis is present.  Aortic valve mean gradient measures 29.0 mmHg. Aortic valve peak gradient  measures 50.7 mmHg. Aortic valve area, by VTI measures 0.69 cm.   Pulmonic Valve: The pulmonic valve was grossly normal. Pulmonic valve  regurgitation is mild. No evidence of pulmonic stenosis.   Aorta: The aortic root and ascending aorta are structurally normal, with  no evidence of dilitation.   Venous: The inferior vena cava is normal in size with greater than 50%  respiratory variability, suggesting right atrial pressure of 3 mmHg.   IAS/Shunts: The atrial septum is grossly normal.      LEFT VENTRICLE  PLAX 2D  LVIDd:         4.90 cm  LVIDs:         3.80 cm  LV PW:         1.30 cm  LV IVS:        1.30 cm  LVOT diam:     2.10 cm  LV SV:         51  LV SV Index:   22  LVOT Area:     3.46 cm     LV Volumes (MOD)  LV vol d, MOD A2C: 84.5 ml  LV vol d, MOD A4C: 91.9 ml  LV vol s, MOD A2C: 42.3 ml  LV vol s, MOD A4C: 41.8 ml  LV SV MOD A2C:     42.2 ml  LV SV MOD A4C:     91.9 ml  LV SV MOD BP:      49.2 ml   RIGHT VENTRICLE  RV S prime:     7.63 cm/s  TAPSE (M-mode): 1.7 cm   LEFT ATRIUM             Index        RIGHT ATRIUM           Index  LA diam:        5.00 cm 2.13 cm/m   RA Area:      22.60 cm  LA Vol (A2C):   99.5 ml 42.38 ml/m  RA Volume:   72.80 ml  31.01 ml/m  LA Vol (A4C):   82.9 ml 35.31 ml/m  LA Biplane Vol: 95.2 ml 40.55 ml/m   AORTIC VALVE                     PULMONIC VALVE  AV Area (Vmax):    0.81 cm      PR End Diast Vel: 7.62 msec  AV Area (Vmean):   0.75 cm  AV Area (VTI):     0.69 cm  AV Vmax:           356.00 cm/s  AV Vmean:          252.333 cm/s  AV VTI:            0.746 m  AV Peak Grad:      50.7 mmHg  AV Mean Grad:      29.0 mmHg  LVOT Vmax:         83.48 cm/s  LVOT Vmean:        54.525 cm/s  LVOT VTI:          0.149 m  LVOT/AV VTI ratio: 0.20     AORTA  Ao Root diam: 3.30 cm  Ao Asc diam:  3.40 cm   TRICUSPID VALVE  TR Peak grad:   31.8 mmHg  TR Vmax:        282.00 cm/s     SHUNTS  Systemic VTI:  0.15 m  Systemic Diam: 2.10 cm   Eleonore Chiquito MD  Electronically signed by Eleonore Chiquito MD  Signature Date/Time: 11/18/2021/12:43:14 PM         Final       Physicians Panel Physicians Referring Physician Case Authorizing Physician  Sherren Mocha, MD (Primary)      Procedures RIGHT HEART CATH AND CORONARY ANGIOGRAPHY  Conclusion  Hemodynamic findings consistent with mild pulmonary hypertension.  1. Known severe aortic stenosis by noninvasive assessment 2. Widely patent coronary arteries with minimal irregularity, no significant coronary stenoses 3. Elevated pulmonary capillary wedge pressure consistent with congestive heart failure/high filling pressures 4. Mild pulmonary hypertension with PA pressure 54/23 mean 34 mmHg, transpulmonary gradient 6 mmHg, PVR less than 1 Woods unit  Recommend:  Continue diuresis (lasix 40 mg IV ordered post-cath x 1)  Continue TAVR evaluation  Resume heparin post-procedure, transition to apixaban pending case manager consult (see notes patient unable to afford apixaban pre-hospital) Indications Severe aortic stenosis [I35.0 (ICD-10-CM)]  Procedural Details Technical Details INDICATION:  Stage D3 aortic stenosis complicated by acute diastolic heart failure  PROCEDURAL DETAILS: There was an indwelling IV in a right antecubital vein. Using normal sterile technique, the IV was changed out for a 5 Fr brachial sheath over a 0.018 inch wire. The right wrist was then prepped, draped, and anesthetized with 1% lidocaine. Using the modified Seldinger technique a 5/6 French Slender sheath was placed in the right radial artery. Intra-arterial verapamil was administered through the radial artery sheath. IV heparin was administered after a JR4 catheter was advanced into the central aorta. A Swan-Ganz catheter was used for the right heart catheterization. Standard protocol was followed for recording of right heart pressures and sampling of oxygen saturations. Fick cardiac output was calculated. Standard Judkins catheters were used for selective coronary angiography. A J-wire directed by the JR4 catheter would not cross the aortic valve. No further attempts were made. There were no immediate procedural complications. The patient was transferred to the post catheterization recovery area for further monitoring.     Estimated blood loss <50 mL.   During this procedure medications were administered to achieve and maintain moderate conscious sedation while the patient's heart rate, blood pressure, and oxygen saturation were continuously monitored and I was present face-to-face 100% of this time.  Medications (Filter: Administrations occurring from 6761 to 0853 on 11/23/21)  important Continuous medications are totaled by the amount administered until 11/23/21 0853.  midazolam (VERSED) injection (mg) Total dose: 2 mg  Date/Time Rate/Dose/Volume Action     11/23/21 0750 1 mg Given    0800 1 mg Given    fentaNYL (SUBLIMAZE) injection (mcg) Total dose: 50 mcg  Date/Time Rate/Dose/Volume Action     11/23/21 0750 25 mcg Given    0800 25 mcg Given    Heparin (Porcine) in NaCl 1000-0.9 UT/500ML-% SOLN  (mL) Total volume: 1,000 mL  Date/Time Rate/Dose/Volume Action     11/23/21 0750 500 mL Given    0750 500 mL Given    lidocaine (PF) (XYLOCAINE) 1 % injection (mL) Total volume: 5 mL  Date/Time Rate/Dose/Volume Action     11/23/21 0800 5 mL Given    Radial Cocktail/Verapamil only (mL) Total volume: 10 mL  Date/Time Rate/Dose/Volume Action     11/23/21 0804 10 mL Given    heparin sodium (porcine) injection (Units) Total dose: 6,000 Units  Date/Time Rate/Dose/Volume Action     11/23/21 0813 6,000 Units Given    iohexol (OMNIPAQUE) 350 MG/ML injection (mL) Total volume: 30 mL  Date/Time Rate/Dose/Volume Action     11/23/21 0821 30 mL Given    0.9 % sodium chloride infusion (mL) Total dose: Cannot be calculated* Dosing weight: 128.9  *Administration dose not documented  Date/Time Rate/Dose/Volume Action     11/23/21 0735 *Not included in total MAR Hold    acetaminophen (TYLENOL) tablet 650 mg (mg) Total dose: Cannot be calculated* Dosing weight: 128.9  *Administration dose not documented  Date/Time Rate/Dose/Volume Action     11/23/21 0735 *Not included in total MAR Hold    atorvastatin (LIPITOR) tablet 10 mg (mg) Total dose: Cannot be calculated* Dosing weight: 128.9  *Administration dose not documented  Date/Time Rate/Dose/Volume Action     11/23/21 0735 *Not included in total MAR Hold    docusate sodium (COLACE) capsule 100 mg (mg) Total dose: Cannot be calculated* Dosing weight: 126.4  *Administration dose not documented  Date/Time Rate/Dose/Volume Action     11/23/21 0735 *Not included in total MAR Hold    guaiFENesin-dextromethorphan (ROBITUSSIN DM) 100-10 MG/5ML syrup 15 mL (mL) Total dose: Cannot be calculated* Dosing weight: 126.4  *Administration dose not documented  Date/Time Rate/Dose/Volume Action     11/23/21 0735 *Not included in total MAR Hold    hydrALAZINE (APRESOLINE) tablet 25 mg (mg) Total dose: Cannot be calculated* Dosing weight: 126.9   *Administration dose not documented  Date/Time Rate/Dose/Volume Action     11/23/21 0735 *Not included in total MAR Hold    ipratropium-albuterol (DUONEB) 0.5-2.5 (3) MG/3ML nebulizer solution 3 mL (mL) Total dose: Cannot be calculated* Dosing weight: 128.9  *Administration dose not documented  Date/Time Rate/Dose/Volume Action     11/23/21 0735 *Not included in total MAR Hold    magnesium oxide (MAG-OX) tablet 400 mg (mg) Total dose: Cannot be calculated*  *Administration dose not documented  Date/Time Rate/Dose/Volume Action     11/23/21 0735 *Not included in total MAR Hold    metoprolol tartrate (LOPRESSOR) tablet 25 mg (mg) Total dose: Cannot be calculated* Dosing weight: 126.4  *Administration dose not documented  Date/Time Rate/Dose/Volume Action     11/23/21 0735 *Not included in total MAR Hold    montelukast (SINGULAIR) tablet 10 mg (mg) Total dose: Cannot be calculated*  *Administration dose not documented  Date/Time Rate/Dose/Volume Action     11/23/21 0735 *Not included in total MAR Hold    omega-3 acid ethyl esters (LOVAZA) capsule 1,000 mg (mg) Total dose: Cannot be calculated*  *Administration dose not documented  Date/Time Rate/Dose/Volume Action  11/23/21 0735 *Not included in total MAR Hold    ondansetron (ZOFRAN) injection 4 mg (mg) Total dose: Cannot be calculated* Dosing weight: 128.9  *Administration dose not documented  Date/Time Rate/Dose/Volume Action     11/23/21 0735 *Not included in total MAR Hold    polyethylene glycol (MIRALAX / GLYCOLAX) packet 17 g (g) Total dose: Cannot be calculated* Dosing weight: 126.4  *Administration dose not documented  Date/Time Rate/Dose/Volume Action     11/23/21 0735 *Not included in total MAR Hold    sodium chloride flush (NS) 0.9 % injection 3 mL (mL) Total dose: Cannot be calculated* Dosing weight: 128.9  *Administration dose not documented  Date/Time Rate/Dose/Volume Action     11/23/21 0735 *Not  included in total MAR Hold    sodium chloride flush (NS) 0.9 % injection 3 mL (mL) Total dose: Cannot be calculated* Dosing weight: 128.9  *Administration dose not documented  Date/Time Rate/Dose/Volume Action     11/23/21 0735 *Not included in total MAR Hold    sodium chloride flush (NS) 0.9 % injection 3 mL (mL) Total dose: Cannot be calculated* Dosing weight: 125.2  *Administration dose not documented  Date/Time Rate/Dose/Volume Action     11/23/21 0735 *Not included in total MAR Hold    Sedation Time Sedation Time Physician-1: 29 minutes 35 seconds  Contrast Medication Name Total Dose  iohexol (OMNIPAQUE) 350 MG/ML injection 30 mL  Radiation/Fluoro Fluoro time: 4.8 (min)  DAP: 11024 (mGycm2)  Cumulative Air Kerma: 585 (mGy)  Complications Complications documented before study signed (11/23/2021 2:77 AM)  No complications were associated with this study.  Documented by Bard Herbert, RN - 11/23/2021 8:25 AM  Coronary Findings Diagnostic Dominance: Right  Left Main  The vessel exhibits minimal luminal irregularities.  Left Anterior Descending  The vessel exhibits minimal luminal irregularities. Widely patent LAD to the apex, patent diagonals, no significant stenoses  Left Circumflex  The vessel exhibits minimal luminal irregularities. The circumflex is widely patent with 2 large OM's with no significant stenoses  Right Coronary Artery  The vessel exhibits minimal luminal irregularities. Widely patent, dominant RCA with no obstructive disease. The PDA and PLA branches are patent with no obstruction.  Intervention  No interventions have been documented.  Right Heart Right Heart Pressures Hemodynamic findings consistent with mild pulmonary hypertension.  Coronary Diagrams Diagnostic Dominance: Right  &&&&&&&&  Intervention Implants  No implant documentation for this case.    Syng[employer] Images Show images for CARDIAC CATHETERIZATION  Images on Long Term Storage Show  images for Yina, [place] to Procedure Log   Procedure Log  Hemo Data Flowsheet Row Most Recent Value  Fick Cardiac Output 8.13 L/min  Fick Cardiac Output Index 3.48 (L/min)/BSA  RA A Wave 16 mmHg  RA V Wave 15 mmHg  RA Mean 10 mmHg  RV Systolic Pressure 50 mmHg  RV Diastolic Pressure 5 mmHg  RV EDP 11 mmHg  PA Systolic Pressure 50 mmHg  PA Diastolic Pressure 25 mmHg  PA Mean 39 mmHg  PW A Wave 32 mmHg  PW V Wave 37 mmHg  PW Mean 28 mmHg  AO Systolic Pressure 824 mmHg  AO Diastolic Pressure 73 mmHg  AO Mean 104 mmHg  QP/QS 1  TPVR Index 9.76 HRUI  TSVR Index 29.86 HRUI  PVR SVR Ratio 0.06  TPVR/TSVR Ratio 0.33      ADDENDUM REPORT: 12/28/2021 11:42   CLINICAL DATA:  Aortic valve replacement (TAVR), pre-op eval   EXAM: Cardiac TAVR CT  TECHNIQUE: The patient was scanned on a Siemens Force 481 slice scanner. A 120 kV retrospective scan was triggered in the descending thoracic aorta at 111 HU's. Gantry rotation speed was 270 msecs and collimation was .9 mm. The 3D data set was reconstructed in 5% intervals of the R-R cycle. Systolic and diastolic phases were analyzed on a dedicated work station using MPR, MIP and VRT modes. The patient received 14mL OMNIPAQUE IOHEXOL 350 MG/ML SOLN of contrast.   FINDINGS: Image quality: Average   Noise   Aortic Valve: Tricuspid aortic valve. Severely reduced cusp separation. Severely thickened, severely calcified aortic valve cusps.   AV calcium score: 1549   Virtual Basal Annulus Measurements:   Maximum/Minimum Diameter: 28.4 x 22.7 mm   Perimeter: 80.9 mm   Area:  496 mm2   No significant LVOT calcifications.   Based on these measurements, the annulus would be suitable for a 26 mm valve.   Sinus of Valsalva Measurements:   Non-coronary:  33 mm   Right - coronary:  32 mm   Left - coronary:  33 mm   Sinus of Valsalva Height:   Left: 23.8 mm   Right: 22.7 mm   Aorta: 4 vessel branch pattern of  aortic arch with the right subclavian artery and right common carotid artery originating directly off aortic arch. Severe aortic atherosclerosis.   Sinotubular Junction:  30 mm   Ascending Thoracic Aorta:  35 mm   Aortic Arch:  28 mm   Descending Thoracic Aorta:  29 mm   Coronary Artery Height above Annulus:   Left main: 16.7 mm   Right coronary: 17.4 mm   Coronary Arteries: Normal coronary origin. Right dominance. The study was performed without use of NTG and insufficient for plaque evaluation.   Optimum Fluoroscopic Angle for Delivery: LAO 3, CAU 4   Moderate mitral annular calcifications.   No left atrial appendage thrombus.   IMPRESSION: 1. Tricuspid aortic valve with severely reduced cusp excursion. Severely thickened and severely calcified aortic valve cusps.   2.  Aortic valve calcium score: 1549   3. Annulus area: 496 mm2, suitable for 26 mm Sapien 3 valve. No LVOT calcifications.   4.  Sufficient coronary artery heights from annulus.   5. Optimum fluoroscopic angle for delivery: LAO 3, CAU 4     Electronically Signed   By: Cherlynn Kaiser M.D.   On: 12/28/2021 11:42    Addended by Elouise Munroe, MD on 12/28/2021 11:45 AM    Study Result   Narrative & Impression  EXAM: OVER-READ INTERPRETATION  CT CHEST   The following report is an over-read performed by radiologist Dr. Yetta Glassman of [facility] [place] on 12/25/2021. This over-read does not include interpretation of cardiac or coronary anatomy or pathology. The coronary calcium score/coronary CTA interpretation by the cardiologist is attached.   COMPARISON:  None.   FINDINGS: Extracardiac findings will be described separately under dictation for contemporaneously obtained CTA chest, abdomen and pelvis.   IMPRESSION: Please see separate dictation for contemporaneously obtained CTA chest, abdomen and pelvis dated 12/25/2021 for full description of relevant extracardiac  findings.   Electronically Signed: By: Yetta Glassman M.D. On: 12/25/2021 12:03        Narrative & Impression  CLINICAL DATA:  Preop evaluation for aortic valve replacement   EXAM: CT ANGIOGRAPHY CHEST, ABDOMEN AND PELVIS   TECHNIQUE: Multidetector CT imaging through the chest, abdomen and pelvis was performed using the standard protocol during bolus administration of intravenous  contrast. Multiplanar reconstructed images and MIPs were obtained and reviewed to evaluate the vascular anatomy.   RADIATION DOSE REDUCTION: This exam was performed according to the departmental dose-optimization program which includes automated exposure control, adjustment of the mA and/or kV according to patient size and/or use of iterative reconstruction technique.   CONTRAST:  167mL OMNIPAQUE IOHEXOL 350 MG/ML SOLN   COMPARISON:  Chest, abdomen and pelvis dated November 09, 2021   FINDINGS: CTA CHEST FINDINGS   Cardiovascular: Cardiomegaly. Mitral annular calcifications. Calcifications and thickening of the aortic valve. Moderate atherosclerotic disease of the thoracic aorta. Left main, lad, and RCA calcifications.   Mediastinum/Nodes: Small hiatal hernia. Heterogeneous enlarged left thyroid lobe, unchanged when compared prior exam. No pathologically enlarged lymph nodes seen in the chest.   Lungs/Pleura: Central airways are patent. Centrilobular emphysema. No consolidation, pleural effusion or pneumothorax.   Musculoskeletal: No chest wall abnormality. No acute or significant osseous findings.   CTA ABDOMEN AND PELVIS FINDINGS   Hepatobiliary: No focal liver abnormality is seen. No gallstones, gallbladder wall thickening, or biliary dilatation.   Pancreas: Unremarkable. No pancreatic ductal dilatation or surrounding inflammatory changes.   Spleen: Normal in size without focal abnormality.   Adrenals/Urinary Tract: Bilateral adrenal glands are unremarkable. Bilateral  low-attenuation renal lesions, largest are compatible simple cysts, unchanged when compared with prior exam.   Stomach/Bowel: Stomach is within normal limits. Diverticulosis. No evidence of bowel wall thickening, distention, or inflammatory changes.   Vascular/lymphatic: Normal caliber abdominal aorta with severe atherosclerotic disease. Aortic branch vessels are patent. Moderate narrowing at the origin of the celiac artery due to calcified plaque and mild narrowing at the origin of the SMA, bilateral renal arteries and SMV due to calcified plaque.   Reproductive: Uterus and bilateral adnexa are unremarkable.   Other: Prior left mastectomy. Small fat containing umbilical hernia. No abdominopelvic ascites.   Musculoskeletal: No acute or significant osseous findings.   VASCULAR MEASUREMENTS PERTINENT TO TAVR:   AORTA:   Minimal Aortic Diameter-10.9 mm   Severity of Aortic Calcification-severe   RIGHT PELVIS:   Right Common Iliac Artery -   Minimal Diameter-4.0 mm   Tortuosity-mild   Calcification-severe   Right External Iliac Artery -   Minimal Diameter-6.5 mm   Tortuosity-mild   Calcification-mild   Right Common Femoral Artery -   Minimal Diameter-0.8 mm   Tortuosity-none   Calcification-moderate   LEFT PELVIS:   Left Common Iliac Artery -   Minimal Diameter-5.3 mm   Tortuosity-none   Calcification-severe   Left External Iliac Artery -   Minimal Diameter-5.4 mm   Tortuosity-mild   Calcification-mild   Left Common Femoral Artery -   Minimal Diameter-10.9 mm   Tortuosity-none   Calcification-moderate   Review of the MIP images confirms the above findings.   IMPRESSION: 1. Vascular findings and measurements pertinent to potential TAVR procedure, as detailed above. 2. Severe thickening calcification of the aortic valve, compatible with reported clinical history of severe aortic stenosis. 3. Moderate to severe aortoiliac  atherosclerosis. Left main and 2 vessel coronary artery disease.     Electronically Signed   By: Yetta Glassman M.D.   On: 12/25/2021 12:50      Impression:   This 76 year old woman has stage D3, severe, symptomatic low-flow/low gradient aortic stenosis with New York Heart Association class II symptoms of exertional fatigue and shortness of breath consistent with chronic diastolic congestive heart failure.  She was admitted in March with class IV symptoms that improved with intravenous diuresis and  maintenance oral diuretic.  I have personally reviewed her 2D echocardiogram, cardiac catheterization, and CTA studies.  Her echocardiogram shows a heavily calcified and thickened aortic valve with restricted leaflet mobility.  The mean gradient was measured at 29 mmHg with a valve area 0.69 cm and dimensionless index of 0.2.  Left ventricular ejection fraction is normal with a low stroke-volume index of 22.  Cardiac catheterization shows patent coronary arteries with no significant stenoses.  I agree that aortic valve replacement is indicated in this patient for relief of her symptoms and to prevent left ventricular deterioration and further admissions for congestive heart failure.  Given her age and comorbid risk factors I think that transcatheter aortic valve replacement would be the best treatment for her.  Her gated cardiac CTA shows anatomy suitable for TAVR using a SAPIEN 3 valve.  Her abdominal and pelvic CTA shows moderate to severe aortoiliac atherosclerosis but there appears to be adequate pelvic vascular anatomy to allow transfemoral insertion.   The patient and her family were counseled at length regarding treatment alternatives for management of severe symptomatic aortic stenosis. The risks and benefits of surgical intervention has been discussed in detail. Long-term prognosis with medical therapy was discussed. Alternative approaches such as conventional surgical aortic valve replacement,  transcatheter aortic valve replacement, and palliative medical therapy were compared and contrasted at length. This discussion was placed in the context of the patient's own specific clinical presentation and past medical history. All of their questions have been addressed.    Following the decision to proceed with transcatheter aortic valve replacement, a discussion was held regarding what types of management strategies would be attempted intraoperatively in the event of life-threatening complications, including whether or not the patient would be considered a candidate for the use of cardiopulmonary bypass and/or conversion to open sternotomy for attempted surgical intervention.  Given her age, morbid obesity, and other comorbid risk factors I do not think she is a candidate for emergent sternotomy to manage any intraoperative complications.  The patient is aware of the fact that transient use of cardiopulmonary bypass may be necessary. The patient has been advised of a variety of complications that might develop including but not limited to risks of death, stroke, paravalvular leak, aortic dissection or other major vascular complications, aortic annulus rupture, device embolization, cardiac rupture or perforation, mitral regurgitation, acute myocardial infarction, arrhythmia, heart block or bradycardia requiring permanent pacemaker placement, congestive heart failure, respiratory failure, renal failure, pneumonia, infection, other late complications related to structural valve deterioration or migration, or other complications that might ultimately cause a temporary or permanent loss of functional independence or other long term morbidity. The patient provides full informed consent for the procedure as described and all questions were answered.       Plan:   Transfemoral TAVR.    Gaye Pollack, MD

## 2022-02-08 NOTE — Anesthesia Preprocedure Evaluation (Addendum)
Anesthesia Evaluation  Patient identified by MRN, date of birth, ID band Patient awake    Reviewed: Allergy & Precautions, NPO status , Patient's Chart, lab work & pertinent test results  History of Anesthesia Complications Negative for: history of anesthetic complications  Airway Mallampati: I  TM Distance: >3 FB Neck ROM: Full    Dental  (+) Edentulous Upper, Edentulous Lower   Pulmonary COPD, former smoker,    Pulmonary exam normal        Cardiovascular hypertension, Pt. on home beta blockers and Pt. on medications +CHF  + dysrhythmias Atrial Fibrillation and Supra Ventricular Tachycardia + Valvular Problems/Murmurs AS  Rhythm:Regular Rate:Normal + Systolic murmurs Cath  .  Hemodynamic findings consistent with mild pulmonary hypertension.  1.  Known severe aortic stenosis by noninvasive assessment 2.  Widely patent coronary arteries with minimal irregularity, no significant coronary stenoses 3.  Elevated pulmonary capillary wedge pressure consistent with congestive heart failure/high filling pressures 4.  Mild pulmonary hypertension with PA pressure 54/23 mean 34 mmHg, transpulmonary gradient 6 mmHg, PVR less than 1 Woods unit  Echo IMPRESSIONS   1. Moderate to severe aortic stenosis is present. Vmax 3.6 m/s, MG 29 mmHG, AVA 0.69cm2, DI 0.20. Findings may represent paradoxical low flow low gradient aortic stenosis due to low SV index (22 cc/m2). Would recommend an aortic valve calcium score or  TEE for clarification. The aortic valve is tricuspid. There is severe calcifcation of the aortic valve. There is severe thickening of the aortic valve. Aortic valve regurgitation is not visualized. Moderate to severe aortic valve stenosis. 2. Left ventricular ejection fraction, by estimation, is 55 to 60%. The left ventricle has normal function. The left ventricle has no regional wall motion abnormalities. There is mild concentric  left ventricular hypertrophy. Left ventricular diastolic  function could not be evaluated. 3. Right ventricular systolic function is normal. The right ventricular size is normal. There is normal pulmonary artery systolic pressure. 4. Left atrial size was mildly dilated. 5. Right atrial size was mildly dilated. 6. The mitral valve is degenerative. Mild to moderate mitral valve regurgitation. 7. The inferior vena cava is normal in size with greater than 50% respiratory variability, suggesting right atrial pressure of 3 mmHg.  Comparison(s): No significant change from prior study.   Neuro/Psych negative neurological ROS     GI/Hepatic negative GI ROS, Neg liver ROS,   Endo/Other  Morbid obesity  Renal/GU Renal InsufficiencyRenal disease     Musculoskeletal negative musculoskeletal ROS (+)   Abdominal   Peds  Hematology negative hematology ROS (+)   Anesthesia Other Findings   Reproductive/Obstetrics                            Anesthesia Physical Anesthesia Plan  ASA: 3  Anesthesia Plan: MAC   Post-op Pain Management: Tylenol PO (pre-op)* and Minimal or no pain anticipated   Induction:   PONV Risk Score and Plan: 2 and Ondansetron, Dexamethasone and Propofol infusion  Airway Management Planned: Natural Airway  Additional Equipment: Arterial line  Intra-op Plan:   Post-operative Plan:   Informed Consent: I have reviewed the patients History and Physical, chart, labs and discussed the procedure including the risks, benefits and alternatives for the proposed anesthesia with the patient or authorized representative who has indicated his/her understanding and acceptance.     Dental advisory given  Plan Discussed with: Anesthesiologist, CRNA and Surgeon  Anesthesia Plan Comments:  Anesthesia Quick Evaluation  

## 2022-02-09 ENCOUNTER — Encounter (HOSPITAL_COMMUNITY): Payer: Self-pay | Admitting: Cardiovascular Disease

## 2022-02-09 ENCOUNTER — Inpatient Hospital Stay (HOSPITAL_COMMUNITY): Payer: Medicare Other | Admitting: Certified Registered"

## 2022-02-09 ENCOUNTER — Other Ambulatory Visit: Payer: Self-pay | Admitting: Physician Assistant

## 2022-02-09 ENCOUNTER — Other Ambulatory Visit: Payer: Self-pay

## 2022-02-09 ENCOUNTER — Inpatient Hospital Stay (HOSPITAL_COMMUNITY)
Admission: RE | Admit: 2022-02-09 | Discharge: 2022-02-10 | DRG: 267 | Disposition: A | Discharge status: Home / Self Care | Payer: Medicare Other | Attending: Surgery | Admitting: Surgery

## 2022-02-09 ENCOUNTER — Encounter (HOSPITAL_COMMUNITY)
Admission: RE | Disposition: A | Discharge status: Home / Self Care | Payer: Self-pay | Source: Home / Self Care | Attending: Surgery

## 2022-02-09 ENCOUNTER — Inpatient Hospital Stay (HOSPITAL_COMMUNITY)
Admission: RE | Admit: 2022-02-09 | Discharge: 2022-02-09 | Disposition: A | Discharge status: Home / Self Care | Payer: Medicare Other | Source: Home / Self Care | Attending: Cardiovascular Disease | Admitting: Cardiovascular Disease

## 2022-02-09 DIAGNOSIS — I110 Hypertensive heart disease with heart failure: Secondary | ICD-10-CM | POA: Diagnosis not present

## 2022-02-09 DIAGNOSIS — Z853 Personal history of malignant neoplasm of breast: Secondary | ICD-10-CM

## 2022-02-09 DIAGNOSIS — Z006 Encounter for examination for normal comparison and control in clinical research program: Secondary | ICD-10-CM

## 2022-02-09 DIAGNOSIS — Z6841 Body Mass Index (BMI) 40.0 and over, adult: Secondary | ICD-10-CM

## 2022-02-09 DIAGNOSIS — I708 Atherosclerosis of other arteries: Secondary | ICD-10-CM | POA: Diagnosis not present

## 2022-02-09 DIAGNOSIS — I509 Heart failure, unspecified: Secondary | ICD-10-CM

## 2022-02-09 DIAGNOSIS — Z7901 Long term (current) use of anticoagulants: Secondary | ICD-10-CM | POA: Diagnosis not present

## 2022-02-09 DIAGNOSIS — I130 Hypertensive heart and chronic kidney disease with heart failure and stage 1 through stage 4 chronic kidney disease, or unspecified chronic kidney disease: Secondary | ICD-10-CM | POA: Diagnosis present

## 2022-02-09 DIAGNOSIS — Z9012 Acquired absence of left breast and nipple: Secondary | ICD-10-CM | POA: Diagnosis not present

## 2022-02-09 DIAGNOSIS — R8271 Bacteriuria: Secondary | ICD-10-CM | POA: Diagnosis present

## 2022-02-09 DIAGNOSIS — G8929 Other chronic pain: Secondary | ICD-10-CM | POA: Diagnosis not present

## 2022-02-09 DIAGNOSIS — I350 Nonrheumatic aortic (valve) stenosis: Secondary | ICD-10-CM

## 2022-02-09 DIAGNOSIS — I4891 Unspecified atrial fibrillation: Secondary | ICD-10-CM | POA: Diagnosis not present

## 2022-02-09 DIAGNOSIS — Z800 Family history of malignant neoplasm of digestive organs: Secondary | ICD-10-CM | POA: Diagnosis not present

## 2022-02-09 DIAGNOSIS — J449 Chronic obstructive pulmonary disease, unspecified: Secondary | ICD-10-CM | POA: Diagnosis present

## 2022-02-09 DIAGNOSIS — I5032 Chronic diastolic (congestive) heart failure: Secondary | ICD-10-CM | POA: Diagnosis present

## 2022-02-09 DIAGNOSIS — E785 Hyperlipidemia, unspecified: Secondary | ICD-10-CM | POA: Diagnosis present

## 2022-02-09 DIAGNOSIS — I482 Chronic atrial fibrillation, unspecified: Secondary | ICD-10-CM | POA: Diagnosis present

## 2022-02-09 DIAGNOSIS — N184 Chronic kidney disease, stage 4 (severe): Secondary | ICD-10-CM

## 2022-02-09 DIAGNOSIS — Z952 Presence of prosthetic heart valve: Secondary | ICD-10-CM | POA: Diagnosis not present

## 2022-02-09 DIAGNOSIS — E782 Mixed hyperlipidemia: Secondary | ICD-10-CM | POA: Diagnosis not present

## 2022-02-09 DIAGNOSIS — I10 Essential (primary) hypertension: Secondary | ICD-10-CM | POA: Diagnosis present

## 2022-02-09 DIAGNOSIS — Z803 Family history of malignant neoplasm of breast: Secondary | ICD-10-CM | POA: Diagnosis not present

## 2022-02-09 DIAGNOSIS — Z808 Family history of malignant neoplasm of other organs or systems: Secondary | ICD-10-CM

## 2022-02-09 DIAGNOSIS — Z85828 Personal history of other malignant neoplasm of skin: Secondary | ICD-10-CM | POA: Diagnosis not present

## 2022-02-09 DIAGNOSIS — Z87891 Personal history of nicotine dependence: Secondary | ICD-10-CM

## 2022-02-09 DIAGNOSIS — M25562 Pain in left knee: Secondary | ICD-10-CM | POA: Diagnosis present

## 2022-02-09 DIAGNOSIS — D6481 Anemia due to antineoplastic chemotherapy: Secondary | ICD-10-CM | POA: Diagnosis present

## 2022-02-09 DIAGNOSIS — I4819 Other persistent atrial fibrillation: Secondary | ICD-10-CM | POA: Diagnosis present

## 2022-02-09 DIAGNOSIS — Z87442 Personal history of urinary calculi: Secondary | ICD-10-CM | POA: Diagnosis not present

## 2022-02-09 DIAGNOSIS — Z86010 Personal history of colonic polyps: Secondary | ICD-10-CM | POA: Diagnosis not present

## 2022-02-09 DIAGNOSIS — N1832 Chronic kidney disease, stage 3b: Secondary | ICD-10-CM | POA: Diagnosis not present

## 2022-02-09 DIAGNOSIS — T451X5A Adverse effect of antineoplastic and immunosuppressive drugs, initial encounter: Secondary | ICD-10-CM | POA: Diagnosis present

## 2022-02-09 DIAGNOSIS — I272 Pulmonary hypertension, unspecified: Secondary | ICD-10-CM | POA: Diagnosis present

## 2022-02-09 DIAGNOSIS — R531 Weakness: Secondary | ICD-10-CM | POA: Diagnosis not present

## 2022-02-09 HISTORY — PX: TRANSCATHETER AORTIC VALVE REPLACEMENT, TRANSFEMORAL: SHX6400

## 2022-02-09 HISTORY — PX: INTRAOPERATIVE TRANSTHORACIC ECHOCARDIOGRAM: SHX6523

## 2022-02-09 HISTORY — DX: Chronic kidney disease, stage 4 (severe): N18.4

## 2022-02-09 HISTORY — DX: Presence of prosthetic heart valve: Z95.2

## 2022-02-09 LAB — POCT I-STAT, CHEM 8
BUN: 26 mg/dL — ABNORMAL HIGH (ref 8–23)
BUN: 25 mg/dL — ABNORMAL HIGH (ref 8–23)
BUN: 25 mg/dL — ABNORMAL HIGH (ref 8–23)
BUN: 25 mg/dL — ABNORMAL HIGH (ref 8–23)
Calcium, Ion: 1.27 mmol/L (ref 1.15–1.40)
Calcium, Ion: 1.24 mmol/L (ref 1.15–1.40)
Calcium, Ion: 1.2 mmol/L (ref 1.15–1.40)
Calcium, Ion: 1.21 mmol/L (ref 1.15–1.40)
Chloride: 106 mmol/L (ref 98–111)
Chloride: 105 mmol/L (ref 98–111)
Chloride: 107 mmol/L (ref 98–111)
Chloride: 107 mmol/L (ref 98–111)
Creatinine, Ser: 1.6 mg/dL — ABNORMAL HIGH (ref 0.44–1.00)
Creatinine, Ser: 1.6 mg/dL — ABNORMAL HIGH (ref 0.44–1.00)
Creatinine, Ser: 1.5 mg/dL — ABNORMAL HIGH (ref 0.44–1.00)
Creatinine, Ser: 1.6 mg/dL — ABNORMAL HIGH (ref 0.44–1.00)
Glucose, Bld: 111 mg/dL — ABNORMAL HIGH (ref 70–99)
Glucose, Bld: 134 mg/dL — ABNORMAL HIGH (ref 70–99)
Glucose, Bld: 137 mg/dL — ABNORMAL HIGH (ref 70–99)
Glucose, Bld: 140 mg/dL — ABNORMAL HIGH (ref 70–99)
HCT: 37 % (ref 36.0–46.0)
HCT: 36 % (ref 36.0–46.0)
HCT: 33 % — ABNORMAL LOW (ref 36.0–46.0)
HCT: 35 % — ABNORMAL LOW (ref 36.0–46.0)
Hemoglobin: 12.6 g/dL (ref 12.0–15.0)
Hemoglobin: 12.2 g/dL (ref 12.0–15.0)
Hemoglobin: 11.2 g/dL — ABNORMAL LOW (ref 12.0–15.0)
Hemoglobin: 11.9 g/dL — ABNORMAL LOW (ref 12.0–15.0)
Potassium: 3.4 mmol/L — ABNORMAL LOW (ref 3.5–5.1)
Potassium: 3.6 mmol/L (ref 3.5–5.1)
Potassium: 3.6 mmol/L (ref 3.5–5.1)
Potassium: 3.7 mmol/L (ref 3.5–5.1)
Sodium: 143 mmol/L (ref 135–145)
Sodium: 142 mmol/L (ref 135–145)
Sodium: 142 mmol/L (ref 135–145)
Sodium: 142 mmol/L (ref 135–145)
TCO2: 23 mmol/L (ref 22–32)
TCO2: 25 mmol/L (ref 22–32)
TCO2: 24 mmol/L (ref 22–32)
TCO2: 25 mmol/L (ref 22–32)

## 2022-02-09 LAB — POCT ACTIVATED CLOTTING TIME
Activated Clotting Time: 138 seconds
Activated Clotting Time: 306 seconds
Activated Clotting Time: 120 seconds

## 2022-02-09 LAB — ECHOCARDIOGRAM LIMITED
AR max vel: 2.45 cm2
AV Area VTI: 1.91 cm2
AV Area mean vel: 1.85 cm2
AV Mean grad: 15 mmHg
AV Peak grad: 20.3 mmHg
Ao pk vel: 2.26 m/s
Single Plane A4C EF: 46.3 %

## 2022-02-09 SURGERY — IMPLANTATION, AORTIC VALVE, TRANSCATHETER, FEMORAL APPROACH
Anesthesia: Monitor Anesthesia Care

## 2022-02-09 MED ORDER — CHLORHEXIDINE GLUCONATE 4 % EX LIQD: 60.0000 mL | Freq: Once | CUTANEOUS | Status: DC

## 2022-02-09 MED ORDER — SODIUM CHLORIDE 0.9% FLUSH
3.0000 mL | Freq: Two times a day (BID) | INTRAVENOUS | Status: DC
  Administered 2022-02-09 – 2022-02-10 (×2): 3 mL via INTRAVENOUS

## 2022-02-09 MED ORDER — LACTATED RINGERS IV SOLN: INTRAVENOUS | Status: DC

## 2022-02-09 MED ORDER — SODIUM CHLORIDE 0.9 % IV SOLN: 250.0000 mL | INTRAVENOUS | Status: DC | PRN

## 2022-02-09 MED ORDER — PROPOFOL 500 MG/50ML IV EMUL
INTRAVENOUS | Status: DC | PRN
  Administered 2022-02-09: 30 ug/kg/min via INTRAVENOUS

## 2022-02-09 MED ORDER — OXYCODONE HCL 5 MG PO TABS: 5.0000 mg | ORAL_TABLET | ORAL | Status: DC | PRN

## 2022-02-09 MED ORDER — CHLORHEXIDINE GLUCONATE 0.12 % MT SOLN
15.0000 mL | Freq: Once | OROMUCOSAL | Status: AC
  Administered 2022-02-09: 15 mL via OROMUCOSAL
  Filled 2022-02-09: qty 15

## 2022-02-09 MED ORDER — ACETAMINOPHEN 500 MG PO TABS
1000.0000 mg | ORAL_TABLET | Freq: Once | ORAL | Status: AC
  Administered 2022-02-09: 1000 mg via ORAL
  Filled 2022-02-09: qty 2

## 2022-02-09 MED ORDER — MORPHINE SULFATE (PF) 2 MG/ML IV SOLN: 1.0000 mg | INTRAVENOUS | Status: DC | PRN

## 2022-02-09 MED ORDER — PROTAMINE SULFATE 10 MG/ML IV SOLN
INTRAVENOUS | Status: DC | PRN
  Administered 2022-02-09: 10 mg via INTRAVENOUS
  Administered 2022-02-09: 170 mg via INTRAVENOUS

## 2022-02-09 MED ORDER — HEPARIN (PORCINE) IN NACL 1000-0.9 UT/500ML-% IV SOLN
INTRAVENOUS | Status: AC
  Filled 2022-02-09: qty 1500

## 2022-02-09 MED ORDER — ACETAMINOPHEN 325 MG PO TABS
650.0000 mg | ORAL_TABLET | Freq: Four times a day (QID) | ORAL | Status: DC | PRN
  Administered 2022-02-09 – 2022-02-10 (×2): 650 mg via ORAL
  Filled 2022-02-09 (×2): qty 2

## 2022-02-09 MED ORDER — FLUTICASONE FUROATE-VILANTEROL 100-25 MCG/ACT IN AEPB
1.0000 | INHALATION_SPRAY | Freq: Every day | RESPIRATORY_TRACT | Status: DC
  Administered 2022-02-10: 1 via RESPIRATORY_TRACT
  Filled 2022-02-09: qty 28

## 2022-02-09 MED ORDER — IOHEXOL 350 MG/ML SOLN
INTRAVENOUS | Status: DC | PRN
  Administered 2022-02-09: 40 mL via INTRA_ARTERIAL

## 2022-02-09 MED ORDER — LIDOCAINE HCL (PF) 1 % IJ SOLN
INTRAMUSCULAR | Status: DC | PRN
  Administered 2022-02-09 (×2): 15 mL

## 2022-02-09 MED ORDER — SODIUM CHLORIDE 0.9 % IV SOLN
INTRAVENOUS | Status: AC
  Administered 2022-02-09: 50 mL/h via INTRAVENOUS

## 2022-02-09 MED ORDER — LIDOCAINE HCL (PF) 1 % IJ SOLN
INTRAMUSCULAR | Status: AC
  Filled 2022-02-09: qty 30

## 2022-02-09 MED ORDER — MONTELUKAST SODIUM 10 MG PO TABS
10.0000 mg | ORAL_TABLET | Freq: Every day | ORAL | Status: DC
  Administered 2022-02-09 – 2022-02-10 (×2): 10 mg via ORAL
  Filled 2022-02-09 (×2): qty 1

## 2022-02-09 MED ORDER — NITROGLYCERIN IN D5W 200-5 MCG/ML-% IV SOLN: 0.0000 ug/min | INTRAVENOUS | Status: DC

## 2022-02-09 MED ORDER — ONDANSETRON HCL 4 MG/2ML IJ SOLN: 4.0000 mg | Freq: Four times a day (QID) | INTRAMUSCULAR | Status: DC | PRN

## 2022-02-09 MED ORDER — NITROGLYCERIN 0.2 MG/ML ON CALL CATH LAB
INTRAVENOUS | Status: DC | PRN
  Administered 2022-02-09: 40 ug via INTRAVENOUS
  Administered 2022-02-09: 20 ug via INTRAVENOUS

## 2022-02-09 MED ORDER — SODIUM CHLORIDE 0.9 % IV SOLN: INTRAVENOUS | Status: DC

## 2022-02-09 MED ORDER — HYDRALAZINE HCL 25 MG PO TABS
37.5000 mg | ORAL_TABLET | Freq: Three times a day (TID) | ORAL | Status: DC
  Administered 2022-02-09 – 2022-02-10 (×3): 37.5 mg via ORAL
  Filled 2022-02-09 (×3): qty 2

## 2022-02-09 MED ORDER — CEFAZOLIN SODIUM-DEXTROSE 2-4 GM/100ML-% IV SOLN
2.0000 g | Freq: Three times a day (TID) | INTRAVENOUS | Status: AC
  Administered 2022-02-09 (×2): 2 g via INTRAVENOUS
  Filled 2022-02-09 (×2): qty 100

## 2022-02-09 MED ORDER — CHLORHEXIDINE GLUCONATE 4 % EX LIQD
30.0000 mL | CUTANEOUS | Status: DC
  Filled 2022-02-09: qty 30

## 2022-02-09 MED ORDER — HEPARIN (PORCINE) IN NACL 1000-0.9 UT/500ML-% IV SOLN
INTRAVENOUS | Status: DC | PRN
  Administered 2022-02-09 (×3): 500 mL

## 2022-02-09 MED ORDER — ONDANSETRON HCL 4 MG/2ML IJ SOLN
INTRAMUSCULAR | Status: DC | PRN
  Administered 2022-02-09: 4 mg via INTRAVENOUS

## 2022-02-09 MED ORDER — ATORVASTATIN CALCIUM 10 MG PO TABS
10.0000 mg | ORAL_TABLET | Freq: Every day | ORAL | Status: DC
  Administered 2022-02-09: 10 mg via ORAL
  Filled 2022-02-09: qty 1

## 2022-02-09 MED ORDER — SODIUM CHLORIDE 0.9% FLUSH: 3.0000 mL | INTRAVENOUS | Status: DC | PRN

## 2022-02-09 MED ORDER — TRAMADOL HCL 50 MG PO TABS: 50.0000 mg | ORAL_TABLET | ORAL | Status: DC | PRN

## 2022-02-09 MED ORDER — ACETAMINOPHEN 650 MG RE SUPP: 650.0000 mg | Freq: Four times a day (QID) | RECTAL | Status: DC | PRN

## 2022-02-09 MED ORDER — HEPARIN SODIUM (PORCINE) 1000 UNIT/ML IJ SOLN
INTRAMUSCULAR | Status: DC | PRN
  Administered 2022-02-09: 18000 [IU] via INTRAVENOUS

## 2022-02-09 SURGICAL SUPPLY — 34 items
BAG SNAP BAND KOVER 36X36 (MISCELLANEOUS) ×4 IMPLANT
CABLE ADAPT PACING TEMP 12FT (ADAPTER) ×1 IMPLANT
CATH 26 ULTRA DELIVERY (CATHETERS) ×1 IMPLANT
CATH DIAG 6FR JR4 (CATHETERS) ×1 IMPLANT
CATH DIAG 6FR PIGTAIL ANGLED (CATHETERS) ×2 IMPLANT
CATH INFINITI 6F AL1 (CATHETERS) ×1 IMPLANT
CATH S G BIP PACING (CATHETERS) ×1 IMPLANT
CLOSURE MYNX CONTROL 6F/7F (Vascular Products) ×1 IMPLANT
CLOSURE PERCLOSE PROSTYLE (VASCULAR PRODUCTS) ×4 IMPLANT
CRIMPER (MISCELLANEOUS) ×1 IMPLANT
DEVICE INFLATION ATRION QL2530 (MISCELLANEOUS) ×1 IMPLANT
GLOVE ECLIPSE 7.0 STRL STRAW (GLOVE) ×2 IMPLANT
KIT HEART LEFT (KITS) ×2 IMPLANT
KIT MICROPUNCTURE NIT STIFF (SHEATH) ×1 IMPLANT
KIT SAPIAN 3 ULTRA RESILIA 26 (Valve) ×1 IMPLANT
PACK CARDIAC CATHETERIZATION (CUSTOM PROCEDURE TRAY) ×2 IMPLANT
SHEATH BRITE TIP 7FR 35CM (SHEATH) ×1 IMPLANT
SHEATH INTRODUCER SET 20-26 (SHEATH) ×1 IMPLANT
SHEATH PINNACLE 6F 10CM (SHEATH) ×1 IMPLANT
SHEATH PINNACLE 8F 10CM (SHEATH) ×1 IMPLANT
SHIELD RADPAD SCOOP 12X17 (MISCELLANEOUS) ×1 IMPLANT
SLEEVE REPOSITIONING LENGTH 30 (MISCELLANEOUS) ×1 IMPLANT
STOPCOCK MORSE 400PSI 3WAY (MISCELLANEOUS) ×4 IMPLANT
SYR MEDRAD MARK V 150ML (SYRINGE) ×1 IMPLANT
TRANSDUCER W/STOPCOCK (MISCELLANEOUS) ×4 IMPLANT
TUBING ART PRESS 72  MALE/FEM (TUBING) ×2
TUBING ART PRESS 72 MALE/FEM (TUBING) IMPLANT
TUBING CONTRAST HIGH PRESS 48 (TUBING) ×1 IMPLANT
WIRE AMPLATZ SS-J .035X180CM (WIRE) ×1 IMPLANT
WIRE EMERALD 3MM-J .035X150CM (WIRE) ×1 IMPLANT
WIRE EMERALD 3MM-J .035X260CM (WIRE) ×1 IMPLANT
WIRE EMERALD ST .035X260CM (WIRE) ×1 IMPLANT
WIRE MICROINTRODUCER 60CM (WIRE) ×2 IMPLANT
WIRE SAFARI SM CURVE 275 (WIRE) ×1 IMPLANT

## 2022-02-09 NOTE — Op Note (Signed)
[facility]   MULTIDISCIPLINARY HEART VALVE TEAM   TAVR OPERATIVE NOTE   Date of Procedure:  02/09/2022  Preoperative Diagnosis: Severe Aortic Stenosis   Postoperative Diagnosis: Same   Procedure:   Transcatheter Aortic Valve Replacement - Percutaneous Transfemoral Approach  Edwards Sapien 3 Ultra Resilia THV (size 26 mm, serial # [id number])   Co-Surgeons:  Gaye Pollack, MD and Sherren Mocha, MD  Anesthesiologist:  Tamela Gammon, MD  Echocardiographer:  Rudean Haskell, MD  Pre-operative Echo Findings: Severe aortic stenosis Normal left ventricular systolic function  Post-operative Echo Findings: No paravalvular leak Normal/unchanged left ventricular systolic function  BRIEF CLINICAL NOTE AND INDICATIONS FOR SURGERY  76 year old woman with hypertension, mixed hyperlipidemia, chronic persistent atrial fibrillation, breast cancer with history of left mastectomy and chemotherapy, and COPD, who has developed progressive and now severe symptomatic aortic stenosis.  She presents today for TAVR via a planned transfemoral approach.  During the course of the patient's preoperative work up they have been evaluated comprehensively by a multidisciplinary team of specialists coordinated through the [facility] in the [facility].  They have been demonstrated to suffer from symptomatic severe aortic stenosis as noted above. The patient has been counseled extensively as to the relative risks and benefits of all options for the treatment of severe aortic stenosis including long term medical therapy, conventional surgery for aortic valve replacement, and transcatheter aortic valve replacement.  The patient has been independently evaluated in formal cardiac surgical consultation by Dr Cyndia Bent, who deemed the patient appropriate for TAVR. Based upon review of all of the patient's preoperative diagnostic tests they are felt to be  candidate for transcatheter aortic valve replacement using the transfemoral approach as an alternative to conventional surgery.    Following the decision to proceed with transcatheter aortic valve replacement, a discussion has been held regarding what types of management strategies would be attempted intraoperatively in the event of life-threatening complications, including whether or not the patient would be considered a candidate for the use of cardiopulmonary bypass and/or conversion to open sternotomy for attempted surgical intervention.  The patient has been advised of a variety of complications that might develop peculiar to this approach including but not limited to risks of death, stroke, paravalvular leak, aortic dissection or other major vascular complications, aortic annulus rupture, device embolization, cardiac rupture or perforation, acute myocardial infarction, arrhythmia, heart block or bradycardia requiring permanent pacemaker placement, congestive heart failure, respiratory failure, renal failure, pneumonia, infection, other late complications related to structural valve deterioration or migration, or other complications that might ultimately cause a temporary or permanent loss of functional independence or other long term morbidity.  The patient provides full informed consent for the procedure as described and all questions were answered preoperatively.  DETAILS OF THE OPERATIVE PROCEDURE  PREPARATION:   The patient is brought to the operating room on the above mentioned date and central monitoring was established by the anesthesia team including placement of a radial arterial line. The patient is placed in the supine position on the operating table.  Intravenous antibiotics are administered. The patient is monitored closely throughout the procedure under conscious sedation.  Baseline transthoracic echocardiogram is performed. The patient's chest, abdomen, both groins, and both lower  extremities are prepared and draped in a sterile manner. A time out procedure is performed.   PERIPHERAL ACCESS:   Using ultrasound guidance, femoral arterial and venous access is obtained with placement of 6 Fr sheaths on the left  side.  [place] images are digitally captured and stored in the patient's chart. A pigtail diagnostic catheter was passed through the femoral arterial sheath under fluoroscopic guidance into the aortic root.  A temporary transvenous pacemaker catheter was passed through the femoral venous sheath under fluoroscopic guidance into the right ventricle.  The pacemaker was tested to ensure stable lead placement and pacemaker capture. Aortic root angiography was performed in order to determine the optimal angiographic angle for valve deployment.  TRANSFEMORAL ACCESS:  A micropuncture technique is used to access the right femoral artery under fluoroscopic and ultrasound guidance.  2 Perclose devices are deployed at 10' and 2' positions to 'PreClose' the femoral artery. An 8 French sheath is placed and then an Amplatz Superstiff wire is advanced through the sheath. This is changed out for a 14 French transfemoral E-Sheath after progressively dilating over the Superstiff wire.  An AL-1 catheter was used to direct a straight-tip exchange length wire across the native aortic valve into the left ventricle. This was exchanged out for a pigtail catheter and position was confirmed in the LV apex. Simultaneous LV and Ao pressures were recorded.  The pigtail catheter was exchanged for a Safari wire in the LV apex.    BALLOON AORTIC VALVULOPLASTY:  Not performed  TRANSCATHETER HEART VALVE DEPLOYMENT:  An Edwards Sapien 3 transcatheter heart valve (size 26 mm) was prepared and crimped per manufacturer's guidelines, and the proper orientation of the valve is confirmed on the [employer] delivery system. The valve was advanced through the introducer sheath using normal technique until in an  appropriate position in the abdominal aorta beyond the sheath tip. The balloon was then retracted and using the fine-tuning wheel was centered on the valve. The valve was then advanced across the aortic arch using appropriate flexion of the catheter. The valve was carefully positioned across the aortic valve annulus. The Commander catheter was retracted using normal technique. Once final position of the valve has been confirmed by angiographic assessment, the valve is deployed while temporarily holding ventilation and during rapid ventricular pacing to maintain systolic blood pressure < 50 mmHg and pulse pressure < 10 mmHg. The balloon inflation is held for >3 seconds after reaching full deployment volume. Once the balloon has fully deflated the balloon is retracted into the ascending aorta and valve function is assessed using echocardiography. The patient's hemodynamic recovery following valve deployment is good.  The deployment balloon and guidewire are both removed. Echo demostrated acceptable post-procedural gradients, stable mitral valve function, and no aortic insufficiency.    PROCEDURE COMPLETION:  The sheath was removed and femoral artery closure is performed using the 2 previously deployed Perclose devices.  Protamine is administered once femoral arterial repair was complete. The site is clear with no evidence of bleeding or hematoma after the sutures are tightened. The temporary pacemaker and pigtail catheters are removed. Mynx closure is used for contralateral femoral arterial hemostasis for the 6 Fr sheath.  The patient tolerated the procedure well and is transported to the recovery area in stable condition. There were no immediate intraoperative complications. All sponge instrument and needle counts are verified correct at completion of the operation.   The patient received a total of 40 mL of intravenous contrast during the procedure.   Sherren Mocha, MD 02/09/2022 1:06 PM

## 2022-02-09 NOTE — Interval H&P Note (Signed)
History and Physical Interval Note:  02/09/2022 8:08 AM  Lindsay [name]  has presented today for surgery, with the diagnosis of Severe Aortic Stenosis.  The various methods of treatment have been discussed with the patient and family. After consideration of risks, benefits and other options for treatment, the patient has consented to  Procedure(s): Transcatheter Aortic Valve Replacement, Transfemoral (N/A) INTRAOPERATIVE TRANSTHORACIC ECHOCARDIOGRAM (N/A) as a surgical intervention.  The patient's history has been reviewed, patient examined, no change in status, stable for surgery.  I have reviewed the patient's chart and labs.  Questions were answered to the patient's satisfaction.     Gaye Pollack

## 2022-02-09 NOTE — Progress Notes (Signed)
  [facility] VALVE TEAM  Patient doing well s/p TAVR. She is hemodynamically stable. Groin sites stable. ECG with afib with no high grade block. Arterial line discontinued and transferred to 4E. Plan for early ambulation after bedrest completed and hopeful discharge over the next 24-48 hours.   Angelena Form PA-C  [facility]  Pager [phone]

## 2022-02-09 NOTE — Transfer of Care (Signed)
Immediate Anesthesia Transfer of Care Note  Patient: Lindsay [name]  Procedure(s) Performed: Transcatheter Aortic Valve Replacement, Transfemoral INTRAOPERATIVE TRANSTHORACIC ECHOCARDIOGRAM  Patient Location: Cath Lab  Anesthesia Type:MAC  Level of Consciousness: awake and drowsy  Airway & Oxygen Therapy: Patient Spontanous Breathing and Patient connected to face mask oxygen  Post-op Assessment: Report given to RN and Post -op Vital signs reviewed and stable  Post vital signs: Reviewed and stable  Last Vitals:  Vitals Value Taken Time  BP 102/56 02/09/22 1116  Temp    Pulse 59 02/09/22 1119  Resp 12 02/09/22 1119  SpO2 99 % 02/09/22 1119  Vitals shown include unvalidated device data.  Last Pain:  Vitals:   02/09/22 0747  TempSrc:   PainSc: 0-No pain         Complications: There were no known notable events for this encounter.

## 2022-02-09 NOTE — Discharge Instructions (Signed)

## 2022-02-09 NOTE — Anesthesia Postprocedure Evaluation (Signed)
Anesthesia Post Note  Patient: Lindsay [name]  Procedure(s) Performed: Transcatheter Aortic Valve Replacement, Transfemoral INTRAOPERATIVE TRANSTHORACIC ECHOCARDIOGRAM     Patient location during evaluation: PACU Anesthesia Type: MAC Level of consciousness: awake and alert Pain management: pain level controlled Vital Signs Assessment: post-procedure vital signs reviewed and stable Respiratory status: spontaneous breathing and respiratory function stable Cardiovascular status: stable Postop Assessment: no apparent nausea or vomiting Anesthetic complications: no   There were no known notable events for this encounter.  Last Vitals:  Vitals:   02/09/22 1300 02/09/22 1340  BP: (!) 112/52 126/65  Pulse: (!) 55 61  Resp: 12 15  Temp:    SpO2: 95% 90%    Last Pain:  Vitals:   02/09/22 1340  TempSrc: Axillary  PainSc: 0-No pain                 Brynnley Dayrit DANIEL

## 2022-02-09 NOTE — Anesthesia Procedure Notes (Signed)
Procedure Name: MAC Date/Time: 02/09/2022 9:10 AM  Performed by: Griffin Dakin, CRNAPre-anesthesia Checklist: Patient identified, Emergency Drugs available, Suction available, Patient being monitored and Timeout performed Patient Re-evaluated:Patient Re-evaluated prior to induction Oxygen Delivery Method: Simple face mask Induction Type: IV induction Airway Equipment and Method: Oral airway Placement Confirmation: positive ETCO2 and breath sounds checked- equal and bilateral Dental Injury: Teeth and Oropharynx as per pre-operative assessment

## 2022-02-09 NOTE — Discharge Summary (Signed)
[name]  Discharge Summary    Patient ID: Lindsay [name] MRN: [id number]; DOB: 05/16/1946  Admit date: 02/09/2022 Discharge date: 02/10/2022  Primary Care Provider: Ronita Hipps, MD  Primary Cardiologist: Shirlee More, MD / Dr. Burt Knack & Dr. Cyndia Bent (TAVR)  Discharge Diagnoses    Principal Problem:   S/P TAVR (transcatheter aortic valve replacement) Active Problems:   Antineoplastic chemotherapy induced anemia   History of left breast cancer   Hyperlipidemia   Essential hypertension   History of kidney stones   Severe aortic stenosis   Asymptomatic bacteriuria   COPD (chronic obstructive pulmonary disease) (HCC)   Uncontrolled hypertension   Chronic atrial fibrillation (HCC)   Chronic kidney disease (CKD), stage IV (severe) (HCC)   Chronic diastolic (congestive) heart failure (HCC)   Allergies No Known Allergies  Diagnostic Studies/Procedures    TAVR OPERATIVE NOTE     Date of Procedure:                02/09/2022   Preoperative Diagnosis:      Severe Aortic Stenosis    Postoperative Diagnosis:    Same    Procedure:        Transcatheter Aortic Valve Replacement - Percutaneous Right Transfemoral Approach             Edwards Sapien 3 Ultra Resilia THV (size 26 mm, model # 9755RSL, serial # [id number])              Co-Surgeons:                        Gaye Pollack, MD and Sherren Mocha, MD       Anesthesiologist:                  Tamela Gammon, MD   Echocardiographer:              Rudean Haskell, Md   Pre-operative Echo Findings: Severe aortic stenosis Normal left ventricular systolic function   Post-operative Echo Findings: No paravalvular leak Normal left ventricular systolic function   _____________    Echo 02/10/22: completed but pending formal read at the time of discharge   History of Present Illness     Lindsay RIEDESEL is a 76 y.o. 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   She had an echocardiogram in 04/2021 showing a mean gradient of 25.5 mmHg with moderate thickening and calcification of the aortic valve.  The valve area by VTI was 0.72 cm.  Stroke-volume index was 20 with a left ventricular ejection fraction of 55 to 60% and grade 2 diastolic dysfunction.  She was thought to have severe low-flow/low gradient aortic stenosis.  She remained asymptomatic but started having symptoms in February 2023.  She initially refused further evaluation but developed progressive exertional fatigue and shortness of breath as well as marked lower extremity edema and was admitted in March 2023 for management of her congestive heart failure with intravenous diuretics. Echo at that time showed EF 55-60%, severe AS with a mean grad 60mmHg, peak grad 50.43mmHg, AVA 0.69cm2, DVI 0.20, SVI 22, as well as mild-Mod MR/TR. She underwent cardiac catheterization on  11/23/2021 showing widely patent coronary arteries with no significant stenoses.  There is mild pulmonary hypertension at 54/23 with a mean of 34.   The patient has been evaluated by the multidisciplinary valve team and felt to have severe, symptomatic aortic stenosis and to be a suitable candidate for TAVR, which was set up for 02/09/22.   Hospital Course     Consultants: none   Severe AS: s/p successful TAVR with a 26 mm Edwards Sapien 3 Ultra Resilia THV via the TF approach on 02/09/22. Post operative echo pending at discharge.  Groin sites are stable. ECG with afib and no high grade heart block. Resume home Eliquis. Plan for discharge home today with close follow up in the office next week.   HTN: BP mildly elevated. Resume home meds.   Chronic  atrial fibrillation: rate mildly elevated, resume home metoprolol 25mg  BID. Resume home Eliquis.  CKD stage IIIb: creat stable around ~1.5. Followed by nephrology in the outpatient setting.  Chronic diastolic CHF: appears euvolemic. Resume home lasix.   _____________  Discharge Vitals Blood pressure (!) 151/67, pulse 96, temperature 98.1 F (36.7 C), temperature source Oral, resp. rate 18, height 5\' 6"  (1.676 m), weight 125.7 kg, SpO2 93 %.  Filed Weights   02/09/22 0708 02/09/22 1340 02/10/22 0522  Weight: 124.3 kg 124.5 kg 125.7 kg     Labs & Radiologic Studies    CBC Recent Labs    02/09/22 1126 02/10/22 0209  WBC  --  7.6  HGB 11.9* 12.1  HCT 35.0* 36.6  MCV  --  92.7  PLT  --  709   Basic Metabolic Panel Recent Labs    02/09/22 1126 02/10/22 0209  NA 142 138  K 3.6 3.6  CL 107 109  CO2  --  23  GLUCOSE 137* 98  BUN 25* 19  CREATININE 1.50* 1.47*  CALCIUM  --  8.7*  MG  --  2.0   Liver Function Tests No results for input(s): "AST", "ALT", "ALKPHOS", "BILITOT", "PROT", "ALBUMIN" in the last 72 hours. No results for input(s): "LIPASE", "AMYLASE" in the last 72 hours. Cardiac Enzymes No results for input(s): "CKTOTAL", "CKMB", "CKMBINDEX", "TROPONINI" in the last 72 hours. BNP Invalid input(s): "POCBNP" D-Dimer No results for input(s): "DDIMER" in the last 72 hours. Hemoglobin A1C No results for input(s): "HGBA1C" in the last 72 hours. Fasting Lipid Panel No results for input(s): "CHOL", "HDL", "LDLCALC", "TRIG", "CHOLHDL", "LDLDIRECT" in the last 72 hours. Thyroid Function Tests No results for input(s): "TSH", "T4TOTAL", "T3FREE", "THYROIDAB" in the last 72 hours.  Invalid input(s): "FREET3" _____________  ECHOCARDIOGRAM LIMITED  Result Date: 02/09/2022    ECHOCARDIOGRAM LIMITED REPORT   Patient Name:   Lindsay [name] Date of Exam: 02/09/2022 Medical Rec #:  [id number]    Height:       66.0 in Accession #:    [id number]   Weight:       274.0 lb Date of  Birth:  [birth year]     BSA:          2.286 m Patient Age:    76 years     BP:           158/75 mmHg Patient Gender: F            HR:           82 bpm. Exam Location:  Inpatient Procedure: Limited Echo, Cardiac Doppler and Color Doppler Indications:    Severe aortic stenosis [I35.0]  History:  Patient has prior history of Echocardiogram examinations, most                 recent 11/18/2021. CHF, Aortic Valve Disease, Arrythmias:SVT and                 Atrial Fibrillation; Risk Factors:Dyslipidemia and Hypertension.                 Chronic kidney disease.                 Aortic Valve: 26 mm Sapien prosthetic, stented (TAVR) valve is                 present in the aortic position. Procedure Date: 02/09/2022.  Sonographer:    Darlina Sicilian RDCS Referring Phys: [place]  1. Interventional TTE for TAVR placement.  2. Prior to procedure, Severe aortic stenosis. No aortic regurgitation. Mean gradient 22 mm Hg, Peak gradient 35 mm Hg. DVI 0.25. AVA 0.79 cm2. (LGLFAS)  3. The aortic valve has been replaced by a 26 mm Sapien Valve. No aortic regurgitation. Mean gradient 3 mm Hg, Peak gradient 6 mm Hg. DVI 0.83 EOA 4 cm2.  4. Left ventricular ejection fraction, by estimation, is 45%. The left ventricle has mildly decreased function. The left ventricle demonstrates global hypokinesis. There is moderate concentric left ventricular hypertrophy.  5. The mitral valve is degenerative. Moderate to severe mitral valve regurgitation.  6. Right ventricular systolic function is normal.  7. Pulmonic valve regurgitation is moderate.  8. Tricuspid valve regurgitation is mild to moderate. Comparison(s): Successful TAVR placement. FINDINGS  Left Ventricle: Left ventricular ejection fraction, by estimation, is 45%. The left ventricle has mildly decreased function. The left ventricle demonstrates global hypokinesis. There is moderate concentric left ventricular hypertrophy. Right Ventricle: Right ventricular systolic  function is normal. Pericardium: There is no evidence of pericardial effusion. Mitral Valve: The mitral valve is degenerative in appearance. Moderate to severe mitral valve regurgitation. Tricuspid Valve: The tricuspid valve is grossly normal. Tricuspid valve regurgitation is mild to moderate. Aortic Valve: The aortic valve has been repaired/replaced. Aortic valve mean gradient measures 15.0 mmHg. Aortic valve peak gradient measures 20.3 mmHg. Aortic valve area, by VTI measures 1.91 cm. There is a 26 mm Sapien prosthetic, stented (TAVR) valve  present in the aortic position. Procedure Date: 02/09/2022. Pulmonic Valve: The pulmonic valve was not well visualized. Pulmonic valve regurgitation is moderate. IAS/Shunts: No atrial level shunt detected by color flow Doppler. LEFT VENTRICLE PLAX 2D LVOT diam:     2.60 cm LV SV:         106 LV SV Index:   46 LVOT Area:     5.31 cm  LV Volumes (MOD) LV vol d, MOD A4C: 95.6 ml LV vol s, MOD A4C: 51.3 ml LV SV MOD A4C:     95.6 ml AORTIC VALVE AV Area (Vmax):    2.45 cm AV Area (Vmean):   1.85 cm AV Area (VTI):     1.91 cm AV Vmax:           225.50 cm/s AV Vmean:          165.650 cm/s AV VTI:            0.554 m AV Peak Grad:      20.3 mmHg AV Mean Grad:      15.0 mmHg LVOT Vmax:         104.00 cm/s LVOT Vmean:  57.700 cm/s LVOT VTI:          0.200 m LVOT/AV VTI ratio: 0.36  SHUNTS Systemic VTI:  0.20 m Systemic Diam: 2.60 cm Rudean Haskell MD Electronically signed by Rudean Haskell MD Signature Date/Time: 02/09/2022/4:50:43 PM    Final    Structural Heart Procedure  Result Date: 02/09/2022 See surgical note for result.  DG Chest 2 View  Result Date: 02/07/2022 CLINICAL DATA:  Preoperative evaluation, severe aortic stenosis EXAM: CHEST - 2 VIEW COMPARISON:  11/21/2021 FINDINGS: Frontal and lateral views of the chest demonstrate an unremarkable cardiac silhouette. No acute airspace disease, effusion, or pneumothorax. Stable scarring at the left lung  base. Stable aortic atherosclerosis. No acute bony abnormalities. IMPRESSION: 1. No acute intrathoracic process. Electronically Signed   By: Randa Ngo M.D.   On: 02/07/2022 10:52    Disposition   Pt is being discharged home today in good condition.  Follow-up Plans & Appointments     Follow-up Information     Eileen Stanford, PA-C. Go on 02/17/2022.   Specialties: Cardiology, Radiology Why: @ 3:30pm, please arrive at least 10 minutes early Contact information: [place] [phone]                  Discharge Medications   Allergies as of 02/10/2022   No Known Allergies      Medication List     TAKE these medications    acetaminophen 500 MG tablet Commonly known as: TYLENOL Take 1,000 mg by mouth every 6 (six) hours as needed for moderate pain or headache.   albuterol 108 (90 Base) MCG/ACT inhaler Commonly known as: VENTOLIN HFA Inhale 2 puffs into the lungs every 4 (four) hours as needed for shortness of breath or wheezing.   apixaban 5 MG Tabs tablet Commonly known as: ELIQUIS Take 1 tablet (5 mg total) by mouth 2 (two) times daily.   atorvastatin 10 MG tablet Commonly known as: LIPITOR Take 10 mg by mouth at bedtime.   Breo Ellipta 100-25 MCG/ACT Aepb Generic drug: fluticasone furoate-vilanterol Inhale 1 puff into the lungs daily.   furosemide 40 MG tablet Commonly known as: LASIX Take 1 tablet (40 mg total) by mouth daily.   hydrALAZINE 25 MG tablet Commonly known as: APRESOLINE Take 37.5 mg by mouth 3 (three) times daily.   ipratropium-albuterol 0.5-2.5 (3) MG/3ML Soln Commonly known as: DUONEB Take 3 mLs by nebulization every 6 (six) hours as needed (shortness of breath).   Magnesium 250 MG Tabs Take 250 mg by mouth daily.   metoprolol tartrate 25 MG tablet Commonly known as: LOPRESSOR Take 1 tablet (25 mg total) by mouth 2 (two) times daily.   montelukast 10 MG tablet Commonly  known as: SINGULAIR Take 10 mg by mouth daily.   Omega-3 1000 MG Caps Take 1,000 mg by mouth 2 (two) times daily.   potassium chloride 10 MEQ tablet Commonly known as: KLOR-CON Take 1 tablet (10 mEq total) by mouth daily.            Outstanding Labs/Studies   none  Duration of Discharge Encounter   Greater than 30 minutes including physician time.  Signed, Angelena Form, PA-C 02/10/2022, 9:17 AM [phone]    Chart reviewed, patient examined, agree with above. She feels well and ambulated this am with walker. She uses a cane at home. Breathing is good.  Lungs clear Heart irregular rate and rhythm. Atrial fib on monitor (chronic) Groin sites look good.  Labs ok ECG: atrial fib rate 100. No acute changes. Plan 2D echo this am and then home.

## 2022-02-09 NOTE — Progress Notes (Signed)
Mobility Specialist: Progress Note   02/09/22 1733  Mobility  Activity Ambulated with assistance in hallway  Level of Assistance Standby assist, set-up cues, supervision of patient - no hands on  Assistive Device Front wheel walker  Distance Ambulated (ft) 160 ft  Activity Response Tolerated well  $Mobility charge 1 Mobility   Pre-Mobility: 60 HR, 96% SpO2 Post-Mobility: 67 HR  Received pt in bed having no complaints and agreeable to mobility. Pt was asymptomatic throughout ambulation and returned to room w/o fault. Left in the chair w/ call bell in reach and all needs met.  [facility] Ednamae Schiano Mobility Specialist Mobility Specialist [address]: [phone]

## 2022-02-09 NOTE — Anesthesia Procedure Notes (Signed)
Arterial Line Insertion Start/End6/13/2023 8:00 AM, 02/09/2022 8:05 AM Performed by: Duane Boston, MD, Griffin Dakin, CRNA  Patient location: Pre-op. Preanesthetic checklist: patient identified, IV checked, site marked, risks and benefits discussed, surgical consent, monitors and equipment checked, pre-op evaluation, timeout performed and anesthesia consent Lidocaine 1% used for infiltration Right, radial was placed Catheter size: 20 G Hand hygiene performed  and maximum sterile barriers used   Attempts: 1 Procedure performed without using ultrasound guided technique. Following insertion, dressing applied and Biopatch. Post procedure assessment: normal and unchanged  Patient tolerated the procedure well with no immediate complications.

## 2022-02-09 NOTE — Op Note (Signed)
[facility]   MULTIDISCIPLINARY HEART VALVE TEAM   TAVR OPERATIVE NOTE   Date of Procedure:  02/09/2022  Preoperative Diagnosis: Severe Aortic Stenosis   Postoperative Diagnosis: Same   Procedure:   Transcatheter Aortic Valve Replacement - Percutaneous Right Transfemoral Approach  Edwards Sapien 3 Ultra Resilia THV (size 26 mm, model # 9755RSL, serial # [id number])   Co-Surgeons:  Gaye Pollack, MD and Sherren Mocha, MD     Anesthesiologist:  Tamela Gammon, MD  Echocardiographer:  Rudean Haskell, Md  Pre-operative Echo Findings: Severe aortic stenosis Normal left ventricular systolic function  Post-operative Echo Findings: No paravalvular leak Normal left ventricular systolic function   BRIEF CLINICAL NOTE AND INDICATIONS FOR SURGERY  This 76 year old woman has stage D3, severe, symptomatic low-flow/low gradient aortic stenosis with New York Heart Association class II symptoms of exertional fatigue and shortness of breath consistent with chronic diastolic congestive heart failure.  She was admitted in March with class IV symptoms that improved with intravenous diuresis and maintenance oral diuretic.  I have personally reviewed her 2D echocardiogram, cardiac catheterization, and CTA studies.  Her echocardiogram shows a heavily calcified and thickened aortic valve with restricted leaflet mobility.  The mean gradient was measured at 29 mmHg with a valve area 0.69 cm and dimensionless index of 0.2.  Left ventricular ejection fraction is normal with a low stroke-volume index of 22.  Cardiac catheterization shows patent coronary arteries with no significant stenoses.  I agree that aortic valve replacement is indicated in this patient for relief of her symptoms and to prevent left ventricular deterioration and further admissions for congestive heart failure.  Given her age and comorbid risk factors I think that transcatheter aortic valve replacement would be the best  treatment for her.  Her gated cardiac CTA shows anatomy suitable for TAVR using a SAPIEN 3 valve.  Her abdominal and pelvic CTA shows moderate to severe aortoiliac atherosclerosis but there appears to be adequate pelvic vascular anatomy to allow transfemoral insertion.   The patient and her family were counseled at length regarding treatment alternatives for management of severe symptomatic aortic stenosis. The risks and benefits of surgical intervention has been discussed in detail. Long-term prognosis with medical therapy was discussed. Alternative approaches such as conventional surgical aortic valve replacement, transcatheter aortic valve replacement, and palliative medical therapy were compared and contrasted at length. This discussion was placed in the context of the patient's own specific clinical presentation and past medical history. All of their questions have been addressed.    Following the decision to proceed with transcatheter aortic valve replacement, a discussion was held regarding what types of management strategies would be attempted intraoperatively in the event of life-threatening complications, including whether or not the patient would be considered a candidate for the use of cardiopulmonary bypass and/or conversion to open sternotomy for attempted surgical intervention.  Given her age, morbid obesity, and other comorbid risk factors I do not think she is a candidate for emergent sternotomy to manage any intraoperative complications.  The patient is aware of the fact that transient use of cardiopulmonary bypass may be necessary. The patient has been advised of a variety of complications that might develop including but not limited to risks of death, stroke, paravalvular leak, aortic dissection or other major vascular complications, aortic annulus rupture, device embolization, cardiac rupture or perforation, mitral regurgitation, acute myocardial infarction, arrhythmia, heart block or  bradycardia requiring permanent pacemaker placement, congestive heart failure, respiratory failure, renal failure, pneumonia, infection, other late  complications related to structural valve deterioration or migration, or other complications that might ultimately cause a temporary or permanent loss of functional independence or other long term morbidity. The patient provides full informed consent for the procedure as described and all questions were answered.     DETAILS OF THE OPERATIVE PROCEDURE  PREPARATION:    The patient was brought to the operating room on the above mentioned date and appropriate monitoring was established by the anesthesia team. The patient was placed in the supine position on the operating table.  Intravenous antibiotics were administered. The patient was monitored closely throughout the procedure under conscious sedation.   Baseline transthoracic echocardiogram was performed. The patient's abdomen and both groins were prepped and draped in a sterile manner. A time out procedure was performed.   PERIPHERAL ACCESS:    Using the modified Seldinger technique, femoral arterial and venous access was obtained with placement of 6 Fr sheaths on the left side.  A pigtail diagnostic catheter was passed through the left arterial sheath under fluoroscopic guidance into the aortic root.  A temporary transvenous pacemaker catheter was passed through the left femoral venous sheath under fluoroscopic guidance into the right ventricle.  The pacemaker was tested to ensure stable lead placement and pacemaker capture. Aortic root angiography was performed in order to determine the optimal angiographic angle for valve deployment.   TRANSFEMORAL ACCESS:   Percutaneous transfemoral access and sheath placement was performed using ultrasound guidance.  The right common femoral artery was cannulated using a micropuncture needle and appropriate location was verified using hand injection angiogram.   A pair of Abbott Perclose percutaneous closure devices were placed and a 6 French sheath replaced into the femoral artery.  The patient was heparinized systemically and ACT verified > 250 seconds.    A 14 Fr transfemoral E-sheath was introduced into the right common femoral artery after progressively dilating over an Amplatz superstiff wire. An AL-1 catheter was used to direct a straight-tip exchange length wire across the native aortic valve into the left ventricle. This was exchanged out for a pigtail catheter and position was confirmed in the LV apex. Simultaneous LV and Ao pressures were recorded.  The pigtail catheter was exchanged for a Safari wire in the LV apex.   BALLOON AORTIC VALVULOPLASTY:   Not performed    TRANSCATHETER HEART VALVE DEPLOYMENT:   An Edwards Sapien 3 Ultra transcatheter heart valve (size 26 mm) was prepared and crimped per manufacturer's guidelines, and the proper orientation of the valve is confirmed on the [employer] delivery system. The valve was advanced through the introducer sheath using normal technique until in an appropriate position in the abdominal aorta beyond the sheath tip. The balloon was then retracted and using the fine-tuning wheel was centered on the valve. The valve was then advanced across the aortic arch using appropriate flexion of the catheter. The valve was carefully positioned across the aortic valve annulus. The Commander catheter was retracted using normal technique. Once final position of the valve has been confirmed by angiographic assessment, the valve is deployed during rapid ventricular pacing to maintain systolic blood pressure < 50 mmHg and pulse pressure < 10 mmHg. The balloon inflation is held for >3 seconds after reaching full deployment volume. Once the balloon has fully deflated the balloon is retracted into the ascending aorta and valve function is assessed using echocardiography. There is felt to be no paravalvular leak and no  central aortic insufficiency.  The patient's hemodynamic recovery following valve deployment  is good.  The deployment balloon and guidewire are both removed.    PROCEDURE COMPLETION:   The sheath was removed and femoral artery closure performed.  Protamine was administered once femoral arterial repair was complete. The temporary pacemaker, pigtail catheter and femoral sheaths were removed with manual pressure used for venous hemostasis.  A Mynx femoral closure device was utilized following removal of the diagnostic sheath in the left femoral artery.  The patient tolerated the procedure well and is transported to the cath lab recovery area in stable condition. There were no immediate intraoperative complications. All sponge instrument and needle counts are verified correct at completion of the operation.   No blood products were administered during the operation.  The patient received a total of 40 mL of intravenous contrast during the procedure.   Gaye Pollack, MD 02/09/2022

## 2022-02-09 NOTE — Progress Notes (Signed)
  Echocardiogram 2D Echocardiogram has been performed.  Darlina Sicilian M 02/09/2022, 10:52 AM

## 2022-02-10 ENCOUNTER — Inpatient Hospital Stay (HOSPITAL_COMMUNITY): Payer: Medicare Other

## 2022-02-10 DIAGNOSIS — I350 Nonrheumatic aortic (valve) stenosis: Secondary | ICD-10-CM | POA: Diagnosis not present

## 2022-02-10 DIAGNOSIS — I5032 Chronic diastolic (congestive) heart failure: Secondary | ICD-10-CM | POA: Diagnosis not present

## 2022-02-10 DIAGNOSIS — Z952 Presence of prosthetic heart valve: Secondary | ICD-10-CM | POA: Diagnosis not present

## 2022-02-10 DIAGNOSIS — I130 Hypertensive heart and chronic kidney disease with heart failure and stage 1 through stage 4 chronic kidney disease, or unspecified chronic kidney disease: Secondary | ICD-10-CM | POA: Diagnosis not present

## 2022-02-10 DIAGNOSIS — Z006 Encounter for examination for normal comparison and control in clinical research program: Secondary | ICD-10-CM | POA: Diagnosis not present

## 2022-02-10 LAB — BASIC METABOLIC PANEL
Anion gap: 6 (ref 5–15)
BUN: 19 mg/dL (ref 8–23)
CO2: 23 mmol/L (ref 22–32)
Calcium: 8.7 mg/dL — ABNORMAL LOW (ref 8.9–10.3)
Chloride: 109 mmol/L (ref 98–111)
Creatinine, Ser: 1.47 mg/dL — ABNORMAL HIGH (ref 0.44–1.00)
GFR, Estimated: 36 mL/min — ABNORMAL LOW (ref >60)
Glucose, Bld: 98 mg/dL (ref 70–99)
Potassium: 3.6 mmol/L (ref 3.5–5.1)
Sodium: 138 mmol/L (ref 135–145)

## 2022-02-10 LAB — ECHOCARDIOGRAM LIMITED
AR max vel: 2.06 cm2
AV Area VTI: 2.36 cm2
AV Area mean vel: 2.4 cm2
AV Mean grad: 11 mmHg
AV Peak grad: 15.6 mmHg
Ao pk vel: 1.97 m/s
Height: 66 in
Weight: 4435.2 oz

## 2022-02-10 LAB — CBC
HCT: 36.6 % (ref 36.0–46.0)
Hemoglobin: 12.1 g/dL (ref 12.0–15.0)
MCH: 30.6 pg (ref 26.0–34.0)
MCHC: 33.1 g/dL (ref 30.0–36.0)
MCV: 92.7 fL (ref 80.0–100.0)
Platelets: 154 10*3/uL (ref 150–400)
RBC: 3.95 MIL/uL (ref 3.87–5.11)
RDW: 13.4 % (ref 11.5–15.5)
WBC: 7.6 10*3/uL (ref 4.0–10.5)
nRBC: 0 % (ref 0.0–0.2)

## 2022-02-10 LAB — MAGNESIUM: Magnesium: 2 mg/dL (ref 1.7–2.4)

## 2022-02-10 NOTE — Progress Notes (Signed)
Echocardiogram 2D Echocardiogram has been performed.  Oneal Deputy Jazzmine Kleiman RDCS 02/10/2022, 10:19 AM

## 2022-02-10 NOTE — Progress Notes (Signed)
Order received to discharge patient.  Telemetry monitor removed and CCMD notified.  PIV access removed.  Rolling walker delivered to patient's room prior to DC.  Discharge instructions, follow up, medications and instructions for their use discussed with patient.

## 2022-02-10 NOTE — TOC Transition Note (Signed)
Transition of Care (TOC) - CM/SW Discharge Note Marvetta Gibbons RN, BSN Transitions of Care Unit 4E- RN Case Manager See Treatment Team for direct phone #    Patient Details  Name: Lindsay [name] MRN: [id number] Date of Birth: 14-Aug-1946  Transition of Care [facility]) CM/SW Contact:  Dawayne Patricia, RN Phone Number: 02/10/2022, 10:38 AM   Clinical Narrative:    Pt stable for transition home today  s/p TAVR. Notified by nursing that pt will need Rolling walker for home. DME order has been placed.  Call made to in house provider- [employer] for DME need- RW to be delivered to room prior to discharge.   No further TOC needs noted. Spouse to transport home.    Final next level of care: Home/Self Care Barriers to Discharge: No Barriers Identified   Patient Goals and CMS Choice    In house provider for DME    Discharge Placement             Home          Discharge Plan and Services   Discharge Planning Services: CM Consult Post Acute Care Choice: Durable Medical Equipment          DME Arranged: Walker rolling DME Agency: [employer] Date DME Agency Contacted: 02/10/22 Time DME Agency Contacted: [id number] Representative spoke with at DME Agency: Jodell Cipro HH Arranged: NA [place] Agency: NA        Social Determinants of Health ([place]) Interventions     Readmission Risk Interventions    02/10/2022   10:38 AM  Readmission Risk Prevention Plan  Transportation Screening Complete  PCP or Specialist Appt within 5-7 Days Complete  Home Care Screening Complete  Medication Review (RN CM) Complete

## 2022-02-10 NOTE — Progress Notes (Addendum)
CARDIAC REHAB PHASE I   PRE:  Rate/Rhythm: 97 A-fib  BP:  Sitting: 141/68      SaO2: 100 RA  MODE:  Ambulation: 146 ft   POST:  Rate/Rhythm:  134 A-fib  BP:  Sitting: 171/68      SaO2: 100   Seen pt from [phone] pt was ambulated through hallways with RW. Pt walked with a steady gait when using RW. I recommended that pt uses RW over cane when home to help with balance. Pt was returned to bed and educated on restrictions, ex guidelines, diet, and CRPII. Pt declined CRPII referral.    Christen Bame  8:28 AM 02/10/2022

## 2022-02-11 ENCOUNTER — Telehealth: Payer: Self-pay | Admitting: Physician Assistant

## 2022-02-11 ENCOUNTER — Telehealth: Payer: Self-pay | Admitting: Cardiology

## 2022-02-11 NOTE — Telephone Encounter (Signed)
LVM for Lindsay [name] to return call

## 2022-02-11 NOTE — Telephone Encounter (Signed)
  [place] VALVE TEAM   Patient contacted regarding discharge from [facility] on 6/14  Patient understands to follow up with a structural heart APP on 6/21 at [place].  Patient understands discharge instructions? yes Patient understands medications and regimen? yes Patient understands to bring all medications to this visit? yes  Angelena Form PA-C  [facility]

## 2022-02-11 NOTE — Telephone Encounter (Signed)
STAT if patient feels like he/she is going to faint   Are you dizzy now? no  Do you feel faint or have you passed out? Room was spinning for about 2 mins  Do you have any other symptoms?    Have you checked your HR and BP (record if available)? 135/95 HR 54

## 2022-02-11 NOTE — Telephone Encounter (Signed)
Advised Lindsay [name] per Dr. Joya Gaskins note. She verbalized understanding and had no further questions.

## 2022-02-12 ENCOUNTER — Ambulatory Visit: Payer: Medicare Other | Admitting: Cardiology

## 2022-02-15 NOTE — Progress Notes (Unsigned)
[facility] [place]                                     Cardiology Office Note:    Date:  02/17/2022   ID:  Lindsay [name], DOB [birth year], MRN [id number]  PCP:  Ronita Hipps, MD  [facility] Cardiologist:  Shirlee More, MD / Dr. Burt Knack & Dr. Cyndia Bent (TAVR) [facility] Electrophysiologist:  None   Referring MD: Ronita Hipps, MD   [facility] s/p TAVR  History of Present Illness:    Lindsay [name] is a 76 y.o. 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   She had an echocardiogram in 04/2021 c/w severe low-flow/low gradient aortic stenosis. She remained asymptomatic but started having symptoms in February 2023. She initially refused further evaluation but developed progressive exertional fatigue and shortness of breath as well as marked lower extremity edema and was admitted in March 2023 for management of her congestive heart failure with intravenous diuretics. Echo at that time showed EF 55-60%, severe AS with a mean grad 49mmHg, peak grad 50.75mmHg, AVA 0.69cm2, DVI 0.20, SVI 22, as well as mild-Mod MR/TR. She underwent cardiac catheterization on 11/23/2021 showing widely patent coronary arteries with no significant stenoses. There is mild pulmonary hypertension at 54/23 with a mean of 34.    She was evaluated by the multidisciplinary valve team and underwent successful TAVR with a 26 mm Edwards Sapien 3 Ultra Resilia THV via the TF approach on 02/09/22. Post operative echo showed EF 50-55%, normally functioning TAVR with a mean gradient of 11 mmHg and no PVL. She was discharged on home Eliquis.   Today the patient presents to clinic for follow up. Here with her husband. Had one peisode of  dizziness when she came home that self resolved. No CP or SOB. No LE edema, orthopnea or PND. No syncope. No blood in stool or urine. No palpitations.    Past Medical History:  Diagnosis Date   Antineoplastic chemotherapy induced anemia 11/03/2017   Arthritis    Asymptomatic bacteriuria 01/18/2022   Baker's cyst of knee, left 02/12/2020   Chronic atrial fibrillation (HCC) 01/18/2022   Chronic diastolic (congestive) heart failure (HCC)    Chronic kidney disease (CKD), stage IV (severe) (HCC)    Chronic pain of left knee 02/12/2020   COPD (chronic obstructive pulmonary disease) ([place]) 01/18/2022   Diverticulitis 01/18/2022   Essential hypertension    History of kidney stones    History of left breast cancer 12/06/2017   Hypercalcemia 01/18/2022   Hyperlipidemia 04/11/2019   Hypoxia 01/18/2022   Malignant neoplasm of upper-inner quadrant of left breast in female, estrogen receptor negative ([place]) 06/21/2017   Neutropenic fever ([place]) 01/18/2022   PAF (paroxysmal atrial fibrillation) (HCC)    PAT (paroxysmal atrial tachycardia) ([place]) 10/30/2016   Personal history of colonic polyps    Personal history of skin cancer    Port-A-Cath in place 07/01/2017   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 Burt Knack & Dr. Cyndia Bent   Severe aortic stenosis 11/17/2021  SVT (supraventricular tachycardia) ([place]) 05/31/2018   Uncontrolled hypertension 01/18/2022    Past Surgical History:  Procedure Laterality Date   COLONOSCOPY     FOOT SURGERY Right    INTRAOPERATIVE TRANSTHORACIC ECHOCARDIOGRAM N/A 02/09/2022   Procedure: INTRAOPERATIVE TRANSTHORACIC ECHOCARDIOGRAM;  Surgeon: Sherren Mocha, MD;  Location: [place] [facility];  Service: Open Heart Surgery;  Laterality: N/A;   IR CV LINE INJECTION  08/02/2017   left breast lumpectomy  2000   MASTECTOMY WITH AXILLARY LYMPH NODE DISSECTION Left 12/06/2017   Procedure: LEFT  MASTECTOMY WITH TARGETED LYMPH NODE DISSECTION;  Surgeon: Coralie Keens, MD;  Location: [place];  Service: General;  Laterality: Left;   PORT-A-CATH REMOVAL Right 01/13/2018   Procedure: REMOVAL PORT-A-CATH;  Surgeon: Coralie Keens, MD;  Location: [place];  Service: General;  Laterality: Right;   PORTACATH PLACEMENT Right 06/30/2017   Procedure: [place];  Surgeon: Coralie Keens, MD;  Location: [place];  Service: General;  Laterality: Right;   RIGHT HEART CATH AND CORONARY ANGIOGRAPHY N/A 11/23/2021   Procedure: RIGHT HEART CATH AND CORONARY ANGIOGRAPHY;  Surgeon: Sherren Mocha, MD;  Location: [place] [facility];  Service: Cardiovascular;  Laterality: N/A;   TRANSCATHETER AORTIC VALVE REPLACEMENT, TRANSFEMORAL N/A 02/09/2022   Procedure: Transcatheter Aortic Valve Replacement, Transfemoral;  Surgeon: Sherren Mocha, MD;  Location: [place] [facility];  Service: Open Heart Surgery;  Laterality: N/A;    Current Medications: Current Meds  Medication Sig   acetaminophen (TYLENOL) 500 MG tablet Take 1,000 mg by mouth every 6 (six) hours as needed for moderate pain or headache.   albuterol (VENTOLIN HFA) 108 (90 Base) MCG/ACT inhaler Inhale 2 puffs into the lungs every 4 (four) hours as needed for shortness of breath or wheezing.   apixaban (ELIQUIS) 5 MG TABS tablet Take 1 tablet (5 mg total) by mouth 2 (two) times daily.   atorvastatin (LIPITOR) 10 MG tablet Take 10 mg by mouth at bedtime.   BREO ELLIPTA 100-25 MCG/ACT AEPB Inhale 1 puff into the lungs daily.   furosemide (LASIX) 40 MG tablet Take 1 tablet (40 mg total) by mouth daily.   hydrALAZINE (APRESOLINE) 25 MG tablet Take 37.5 mg by mouth 3 (three) times daily.   ipratropium-albuterol (DUONEB) 0.5-2.5 (3) MG/3ML SOLN Take 3 mLs by nebulization every 6 (six) hours as needed (shortness of breath).   Magnesium 250 MG TABS Take 250 mg by mouth daily.   metoprolol tartrate (LOPRESSOR) 25 MG  tablet Take 1 tablet (25 mg total) by mouth 2 (two) times daily.   montelukast (SINGULAIR) 10 MG tablet Take 10 mg by mouth daily.   Omega-3 1000 MG CAPS Take 1,000 mg by mouth 2 (two) times daily.   potassium chloride (KLOR-CON) 10 MEQ tablet Take 1 tablet (10 mEq total) by mouth daily.     Allergies:   Patient has no known allergies.   Social History   Socioeconomic History   Marital status: Married    Spouse name: Sonia Side   Number of children: 2   Years of education: Not on file   Highest education level: Not on file  Occupational History   Not on file  Tobacco Use   Smoking status: Former    Packs/day: 2.00    Years: 50.00    Total pack years: 100.00    Types: Cigarettes    Quit date: 06/02/2014    Years since quitting: 7.7    Passive exposure: Past   Smokeless tobacco: Never  Vaping  Use   Vaping Use: Never used  Substance and Sexual Activity   Alcohol use: No   Drug use: No   Sexual activity: Yes  Other Topics Concern   Not on file  Social History Narrative   Not on file   Social Determinants of Health   Financial Resource Strain: Not on file  Food Insecurity: Not on file  Transportation Needs: Not on file  Physical Activity: Not on file  Stress: Not on file  Social Connections: Not on file     Family History: The patient's family history includes Basal cell carcinoma in her sister; Breast cancer in her paternal aunt; Breast cancer (age of onset: 26) in her sister; Breast cancer (age of onset: 33) in her sister; Colon cancer (age of onset: 44) in her sister; Melanoma in her sister; Pancreatic cancer (age of onset: 61) in her mother.  ROS:   Please see the history of present illness.    All other systems reviewed and are negative.  EKGs/Labs/Other Studies Reviewed:    The following studies were reviewed today:  TAVR OPERATIVE NOTE     Date of Procedure:                02/09/2022   Preoperative Diagnosis:      Severe Aortic Stenosis    Postoperative  Diagnosis:    Same    Procedure:        Transcatheter Aortic Valve Replacement - Percutaneous Right Transfemoral Approach             Edwards Sapien 3 Ultra Resilia THV (size 26 mm, model # 9755RSL, serial # [id number])              Co-Surgeons:                        Gaye Pollack, MD and Sherren Mocha, MD       Anesthesiologist:                  Tamela Gammon, MD   Echocardiographer:              Rudean Haskell, Md   Pre-operative Echo Findings: Severe aortic stenosis Normal left ventricular systolic function   Post-operative Echo Findings: No paravalvular leak Normal left ventricular systolic function   _____________     Echo 02/10/22:  IMPRESSIONS  1. The aortic valve has been replaced by a 26 mm Sapien valve. Aortic  valve regurgitation is not visualized (no central AI or PVL). Effective  orifince area, by VTI measures 2.36 cm. Aortic valve mean gradient  measures 11.0 mmHg. Peak gradiennt 20 mm  Hg. DVI 0.72.   2. Left ventricular ejection fraction, by estimation, is 50 to 55%. The  left ventricle has low normal function. The left ventricle has no regional  wall motion abnormalities. There is moderate concentric left ventricular  hypertrophy. Left ventricular  diastolic parameters are indeterminate.   3. Right ventricular systolic function is normal. The right ventricular  size is normal. There is mildly elevated pulmonary artery systolic  pressure. The estimated right ventricular systolic pressure is 29.4 mmHg.   4. Left atrial size was moderately dilated.   5. The mitral valve is degenerative. Mild mitral valve regurgitation.  Moderate mitral annular calcification.   6. Tricuspid valve regurgitation is moderate.   7. The inferior vena cava is dilated in size with >50% respiratory  variability, suggesting right atrial pressure of 8 mmHg.   Comparison(s):  Echo findings are consistent with normal structure and  function of the aortic valve prosthesis. Mitral  regurgitation has  improved.   EKG:  EKG is ordered today.  The ekg ordered today demonstrates afib HR 71.   Recent Labs: 11/05/2021: NT-Pro BNP 4,799 11/17/2021: B Natriuretic Peptide 765.5 02/05/2022: ALT 20 02/10/2022: BUN 19; Creatinine, Ser 1.47; Hemoglobin 12.1; Magnesium 2.0; Platelets 154; Potassium 3.6; Sodium 138  Recent Lipid Panel No results found for: "CHOL", "TRIG", "HDL", "CHOLHDL", "VLDL", "LDLCALC", "LDLDIRECT"   Risk Assessment/Calculations:       Physical Exam:    VS:  BP 118/78   Pulse 71   Ht 5\' 6"  (1.676 m)   Wt 274 lb (124.3 kg)   SpO2 97%   BMI 44.22 kg/m     Wt Readings from Last 3 Encounters:  02/17/22 274 lb (124.3 kg)  02/10/22 277 lb 3.2 oz (125.7 kg)  02/05/22 273 lb 11.2 oz (124.1 kg)     GEN:  Well nourished, well developed in no acute distress HEENT: Normal NECK: No JVD LYMPHATICS: No lymphadenopathy CARDIAC: irreg irreg, no murmurs, rubs, gallops RESPIRATORY:  Clear to auscultation without rales, wheezing or rhonchi  ABDOMEN: Soft, non-tender, non-distended MUSCULOSKELETAL:  No edema; No deformity  SKIN: Warm and dry..  Groin sites clear without hematoma or ecchymosis  NEUROLOGIC:  Alert and oriented x 3 PSYCHIATRIC:  Normal affect   ASSESSMENT:    1. S/P TAVR (transcatheter aortic valve replacement)   2. Essential hypertension   3. Chronic atrial fibrillation (HCC)   4. Stage 3b chronic kidney disease ([place])   5. Chronic diastolic (congestive) heart failure (HCC)    PLAN:    In order of problems listed above:  Severe AS s/p TAVR: doing great 1 week out form TAVR. Groin sites healing well. ECG with no HAVB. Continue on Eliquis alone. SBE prophylaxis discussed; the patient is edentulous and does not go to the dentist. I will see her back in 1 month with an echo.   HTN: BP well controlled. No changes made.    Chronic atrial fibrillation: rate well controlled. Continue Eliquis   CKD stage IIIb: creat stable around ~1.5. Followed  by nephrology    Chronic diastolic CHF: appears euvolemic. No changes made   Medication Adjustments/Labs and Tests Ordered: Current medicines are reviewed at length with the patient today.  Concerns regarding medicines are outlined above.  Orders Placed This Encounter  Procedures   EKG 12-Lead   No orders of the defined types were placed in this encounter.   Patient Instructions  Medication Instructions:  Your physician recommends that you continue on your current medications as directed. Please refer to the Current Medication list given to you today.  *If you need a refill on your cardiac medications before your next appointment, please call your pharmacy*   Lab Work: NONE If you have labs (blood work) drawn today and your tests are completely normal, you will receive your results only by: [place] (if you have MyChart) OR A paper copy in the mail If you have any lab test that is abnormal or we need to change your treatment, we will call you to review the results.   Testing/Procedures: NONE   Follow-Up: At [facility], you and your health needs are our priority.  As part of our continuing mission to provide you with exceptional heart care, we have created designated Provider Care Teams.  These Care Teams include your primary Cardiologist (physician) and Advanced Practice Providers (APPs -  Physician Assistants and Nurse Practitioners) who all work together to provide you with the care you need, when you need it.  We recommend signing up for the patient portal called "MyChart".  Sign up information is provided on this After Visit Summary.  MyChart is used to connect with patients for Virtual Visits (Telemedicine).  Patients are able to view lab/test results, encounter notes, upcoming appointments, etc.  Non-urgent messages can be sent to your provider as well.   To learn more about what you can do with MyChart, go to [email or web address].    Your next appointment:    KEEP SCHEDULED FOLLOW-UP  Important Information About Sugar         Weston Brass Angelena Form, PA-C  02/17/2022 5:12 PM    [place]

## 2022-02-17 ENCOUNTER — Ambulatory Visit: Payer: Medicare Other | Admitting: Physician Assistant

## 2022-02-17 VITALS — BP 118/78 | HR 71 | Ht 66.0 in | Wt 274.0 lb

## 2022-02-17 DIAGNOSIS — Z952 Presence of prosthetic heart valve: Secondary | ICD-10-CM

## 2022-02-17 DIAGNOSIS — I10 Essential (primary) hypertension: Secondary | ICD-10-CM | POA: Diagnosis not present

## 2022-02-17 DIAGNOSIS — I5032 Chronic diastolic (congestive) heart failure: Secondary | ICD-10-CM

## 2022-02-17 DIAGNOSIS — I482 Chronic atrial fibrillation, unspecified: Secondary | ICD-10-CM

## 2022-02-17 DIAGNOSIS — N1832 Chronic kidney disease, stage 3b: Secondary | ICD-10-CM | POA: Diagnosis not present

## 2022-02-17 NOTE — Patient Instructions (Signed)
Medication Instructions:  ?Your physician recommends that you continue on your current medications as directed. Please refer to the Current Medication list given to you today.  ?*If you need a refill on your cardiac medications before your next appointment, please call your pharmacy* ? ? ?Lab Work: ?NONE ?If you have labs (blood work) drawn today and your tests are completely normal, you will receive your results only by: ?MyChart Message (if you have MyChart) OR ?A paper copy in the mail ?If you have any lab test that is abnormal or we need to change your treatment, we will call you to review the results. ? ? ?Testing/Procedures: ?NONE ? ? ?Follow-Up: ?At [facility], you and your health needs are our priority.  As part of our continuing mission to provide you with exceptional heart care, we have created designated Provider Care Teams.  These Care Teams include your primary Cardiologist (physician) and Advanced Practice Providers (APPs -  Physician Assistants and Nurse Practitioners) who all work together to provide you with the care you need, when you need it. ? ?We recommend signing up for the patient portal called "MyChart".  Sign up information is provided on this After Visit Summary.  MyChart is used to connect with patients for Virtual Visits (Telemedicine).  Patients are able to view lab/test results, encounter notes, upcoming appointments, etc.  Non-urgent messages can be sent to your provider as well.   ?To learn more about what you can do with MyChart, go to [email or web address].   ? ?Your next appointment:   ?KEEP SCHEDULED FOLLOW-UP ? ?Important Information About Sugar ? ? ? ? ?  ?

## 2022-03-12 ENCOUNTER — Ambulatory Visit: Payer: Medicare Other | Admitting: Cardiology

## 2022-03-16 NOTE — Progress Notes (Unsigned)
HE[facility] AND [place]                                     Cardiology Office Note:    Date:  03/17/2022   ID:  [name], DOB 1946/03/08, MRN [id number]  PCP:  Ronita Hipps, MD  [facility] Cardiologist:  Shirlee More, MD / Dr. Burt Knack, MD & Dr. Cyndia Bent, MD  [facility] Electrophysiologist:  None   Referring MD: Ronita Hipps, MD   Chief Complaint  Patient presents with   Follow-up    1 month s/p TAVR   History of Present Illness:    Lindsay [name] is a 76 y.o. 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    She had an echocardiogram in 04/2021 c/w severe low-flow/low gradient aortic stenosis. She remained asymptomatic but started having symptoms in February 2023. She initially refused further evaluation but developed progressive exertional fatigue and shortness of breath as well as marked lower extremity edema and was admitted in March 2023 for management of her congestive heart failure with intravenous diuretics. Echo at that time showed EF 55-60%, severe AS with a mean grad 60mmHg, peak grad 50.8mmHg, AVA 0.69cm2, DVI 0.20, SVI 22, as well as mild-Mod MR/TR. She underwent cardiac catheterization on 11/23/2021 showing widely patent coronary arteries with no significant stenoses. There is mild pulmonary hypertension at 54/23 with a mean of 34.    She was evaluated by the multidisciplinary valve team and underwent successful TAVR with a 26 mm Edwards Sapien 3 Ultra Resilia THV via the TF approach on 02/09/22. Post operative echo showed EF 50-55%, normally functioning TAVR with a mean gradient of 11 mmHg and no PVL. She was discharged on home Eliquis.   She was seen in one week  follow up and was doing well with no issues and symptom improvement. Today she is here with her husband and daughter. She continues to do very well. She is back to her normal activities with no SOB, LE edema, dizziness, chest pain, or orthopnea. Echo today shows stable S3UR 1mm valve with mean gradient at 8 mmHg, peak 12.9 mmHg with no AR, PVL or AI.    Past Medical History:  Diagnosis Date   Antineoplastic chemotherapy induced anemia 11/03/2017   Arthritis    Asymptomatic bacteriuria 01/18/2022   Baker's cyst of knee, left 02/12/2020   Chronic atrial fibrillation ([place]) 01/18/2022   Chronic diastolic (congestive) heart failure (HCC)    Chronic kidney disease (CKD), stage IV (severe) (HCC)    Chronic pain of left knee 02/12/2020   COPD (chronic obstructive pulmonary disease) ([place]) 01/18/2022   Diverticulitis 01/18/2022   Essential hypertension    History of kidney stones    History of left breast cancer 12/06/2017   Hypercalcemia 01/18/2022   Hyperlipidemia 04/11/2019   Hypoxia 01/18/2022   Malignant neoplasm of upper-inner quadrant of left breast in female, estrogen receptor negative ([place]) 06/21/2017   Neutropenic fever ([place]) 01/18/2022   PAF (paroxysmal atrial fibrillation) (HCC)    PAT (paroxysmal atrial tachycardia) ([place]) 10/30/2016   Personal history of colonic polyps    Personal history  of skin cancer    Port-A-Cath in place 07/01/2017   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 Burt Knack & Dr. Cyndia Bent   Severe aortic stenosis 11/17/2021   SVT (supraventricular tachycardia) ([place]) 05/31/2018   Uncontrolled hypertension 01/18/2022    Past Surgical History:  Procedure Laterality Date   COLONOSCOPY     FOOT SURGERY Right    INTRAOPERATIVE TRANSTHORACIC ECHOCARDIOGRAM N/A 02/09/2022   Procedure: INTRAOPERATIVE TRANSTHORACIC ECHOCARDIOGRAM;  Surgeon: Sherren Mocha, MD;  Location: [place] [facility];   Service: Open Heart Surgery;  Laterality: N/A;   IR CV LINE INJECTION  08/02/2017   left breast lumpectomy  2000   MASTECTOMY WITH AXILLARY LYMPH NODE DISSECTION Left 12/06/2017   Procedure: LEFT MASTECTOMY WITH TARGETED LYMPH NODE DISSECTION;  Surgeon: Coralie Keens, MD;  Location: [place];  Service: General;  Laterality: Left;   PORT-A-CATH REMOVAL Right 01/13/2018   Procedure: REMOVAL PORT-A-CATH;  Surgeon: Coralie Keens, MD;  Location: [place];  Service: General;  Laterality: Right;   PORTACATH PLACEMENT Right 06/30/2017   Procedure: [place];  Surgeon: Coralie Keens, MD;  Location: [place];  Service: General;  Laterality: Right;   RIGHT HEART CATH AND CORONARY ANGIOGRAPHY N/A 11/23/2021   Procedure: RIGHT HEART CATH AND CORONARY ANGIOGRAPHY;  Surgeon: Sherren Mocha, MD;  Location: [place] [facility];  Service: Cardiovascular;  Laterality: N/A;   TRANSCATHETER AORTIC VALVE REPLACEMENT, TRANSFEMORAL N/A 02/09/2022   Procedure: Transcatheter Aortic Valve Replacement, Transfemoral;  Surgeon: Sherren Mocha, MD;  Location: [place] [facility];  Service: Open Heart Surgery;  Laterality: N/A;    Current Medications: No outpatient medications have been marked as taking for the 03/17/22 encounter (Office Visit) with [facility].     Allergies:   Patient has no known allergies.   Social History   Socioeconomic History   Marital status: Married    Spouse name: Sonia Side   Number of children: 2   Years of education: Not on file   Highest education level: Not on file  Occupational History   Not on file  Tobacco Use   Smoking status: Former    Packs/day: 2.00    Years: 50.00    Total pack years: 100.00    Types: Cigarettes    Quit date: 06/02/2014    Years since quitting: 7.7    Passive exposure: Past   Smokeless tobacco: Never  Vaping Use   Vaping Use: Never used  Substance and Sexual Activity   Alcohol use: No   Drug  use: No   Sexual activity: Yes  Other Topics Concern   Not on file  Social History Narrative   Not on file   Social Determinants of Health   Financial Resource Strain: Not on file  Food Insecurity: Not on file  Transportation Needs: Not on file  Physical Activity: Not on file  Stress: Not on file  Social Connections: Not on file     Family History: The patient's family history includes Basal cell carcinoma in her sister; Breast cancer in her paternal aunt; Breast cancer (age of onset: 84) in her sister; Breast cancer (age of onset: 27) in her sister; Colon cancer (age of onset: 66) in her sister; Melanoma in her sister; Pancreatic cancer (age of onset: 78) in her mother.  ROS:   Please see the history of present illness.    All other systems reviewed  and are negative.  EKGs/Labs/Other Studies Reviewed:    The following studies were reviewed today:  Echocardiogram 03/17/22:   1. Left ventricular ejection fraction, by estimation, is 60 to 65%. The  left ventricle has normal function. The left ventricle has no regional  wall motion abnormalities. There is mild asymmetric left ventricular  hypertrophy of the basal-septal segment.  Left ventricular diastolic parameters are indeterminate.   2. Right ventricular systolic function is normal. The right ventricular  size is moderately enlarged.   3. Left atrial size was moderately dilated.   4. Right atrial size was moderately dilated.   5. Mild mitral valve regurgitation. Moderate to severe mitral annular  calcification.   6. Tricuspid valve regurgitation is mild to moderate.   7. S/p 26 mm Edwards TAVR prosthesis, V max 1.7 m/s. Mean gradient 8  mmHg, Peak gradient 12.9 mmHg. Normal prosthesis. Aortic valve  regurgitation is not visualized.   8. The inferior vena cava is normal in size with greater than 50%  respiratory variability, suggesting right atrial pressure of 3 mmHg.    TAVR OPERATIVE NOTE     Date of Procedure:                 02/09/2022   Preoperative Diagnosis:      Severe Aortic Stenosis    Postoperative Diagnosis:    Same    Procedure:        Transcatheter Aortic Valve Replacement - Percutaneous Right Transfemoral Approach             Edwards Sapien 3 Ultra Resilia THV (size 26 mm, model # 9755RSL, serial # [id number])              Co-Surgeons:                        Gaye Pollack, MD and Sherren Mocha, MD       Anesthesiologist:                  Tamela Gammon, MD   Echocardiographer:              Rudean Haskell, Md   Pre-operative Echo Findings: Severe aortic stenosis Normal left ventricular systolic function   Post-operative Echo Findings: No paravalvular leak Normal left ventricular systolic function   _____________   Echo 02/10/22:  IMPRESSIONS  1. The aortic valve has been replaced by a 26 mm Sapien valve. Aortic  valve regurgitation is not visualized (no central AI or PVL). Effective  orifince area, by VTI measures 2.36 cm. Aortic valve mean gradient  measures 11.0 mmHg. Peak gradiennt 20 mm  Hg. DVI 0.72.   2. Left ventricular ejection fraction, by estimation, is 50 to 55%. The  left ventricle has low normal function. The left ventricle has no regional  wall motion abnormalities. There is moderate concentric left ventricular  hypertrophy. Left ventricular  diastolic parameters are indeterminate.   3. Right ventricular systolic function is normal. The right ventricular  size is normal. There is mildly elevated pulmonary artery systolic  pressure. The estimated right ventricular systolic pressure is 01.7 mmHg.   4. Left atrial size was moderately dilated.   5. The mitral valve is degenerative. Mild mitral valve regurgitation.  Moderate mitral annular calcification.   6. Tricuspid valve regurgitation is moderate.   7. The inferior vena cava is dilated in size with >50% respiratory  variability, suggesting right atrial pressure of 8 mmHg.   Comparison(s): Echo  findings are consistent with normal structure and  function of the aortic valve prosthesis. Mitral regurgitation has  improved.   EKG:  EKG is not ordered today.  Recent Labs: 11/05/2021: NT-Pro BNP 4,799 11/17/2021: B Natriuretic Peptide 765.5 02/05/2022: ALT 20 02/10/2022: BUN 19; Creatinine, Ser 1.47; Hemoglobin 12.1; Magnesium 2.0; Platelets 154; Potassium 3.6; Sodium 138   Recent Lipid Panel No results found for: "CHOL", "TRIG", "HDL", "CHOLHDL", "VLDL", "LDLCALC", "LDLDIRECT"   Physical Exam:    VS:  BP (!) 142/80   Pulse 75   Ht 5\' 6"  (1.676 m)   Wt 272 lb (123.4 kg)   SpO2 93%   BMI 43.90 kg/m     Wt Readings from Last 3 Encounters:  03/17/22 272 lb (123.4 kg)  02/17/22 274 lb (124.3 kg)  02/10/22 277 lb 3.2 oz (125.7 kg)    General: Well developed, well nourished, NAD Lungs:Clear to ausculation bilaterally. No wheezes, rales, or rhonchi. Breathing is unlabored. Cardiovascular: RRR with S1 S2. No murmurs Extremities: No edema.  Neuro: Alert and oriented. No focal deficits. No facial asymmetry. MAE spontaneously. Psych: Responds to questions appropriately with normal affect.    ASSESSMENT/PLAN:    Severe AS s/p TAVR: Continues to do very well s/p TAVR with NYHA class I symptoms. Echocardiogram today with stable S3UR  26mm valve with mean gradient at 8 mmHg and peak 12.9 mmHg. Continue on Eliquis monotherapy. SBE prophylaxis discussed; the patient is edentulous and does not go to the dentist. She has follow up with Dr. Bettina Gavia 06/2022 and will follow with [facility] in one year with echocardiogram    HTN: Stable with no need for medication changes today.    Chronic atrial fibrillation: Continue Eliquis. Denies palpitations. Obtain CBC today    CKD stage IIIb: Creat typically in the 1.5 range. Followed by nephrology. Obtain BMET today    Chronic diastolic CHF: Appears euvolemic today.    Medication Adjustments/Labs and Tests Ordered: Current medicines are reviewed at length  with the patient today.  Concerns regarding medicines are outlined above.  Orders Placed This Encounter  Procedures   Basic metabolic panel   CBC   No orders of the defined types were placed in this encounter.   Patient Instructions  Medication Instructions:  Your physician recommends that you continue on your current medications as directed. Please refer to the Current Medication list given to you today.  *If you need a refill on your cardiac medications before your next appointment, please call your pharmacy*   Lab Work: TODAY: CBC, BMET If you have labs (blood work) drawn today and your tests are completely normal, you will receive your results only by: [place] (if you have MyChart) OR A paper copy in the mail If you have any lab test that is abnormal or we need to change your treatment, we will call you to review the results.   Testing/Procedures: NONE   Follow-Up: At [facility], you and your health needs are our priority.  As part of our continuing mission to provide you with exceptional heart care, we have created designated Provider Care Teams.  These Care Teams include your primary Cardiologist (physician) and Advanced Practice Providers (APPs -  Physician Assistants and Nurse Practitioners) who all work together to provide you with the care you need, when you need it.  We recommend signing up for the patient portal called "MyChart".  Sign up information is provided on this After Visit Summary.  MyChart is used to connect with patients for  Virtual Visits (Telemedicine).  Patients are able to view lab/test results, encounter notes, upcoming appointments, etc.  Non-urgent messages can be sent to your provider as well.   To learn more about what you can do with MyChart, go to [email or web address].    Your next appointment:   3 month(s) 05/2022  The format for your next appointment:   In Person  Provider:   Shirlee More, MD{    Important Information  About [place]         Signed, Kathyrn Drown, NP  03/17/2022 1:01 PM    [place]

## 2022-03-17 ENCOUNTER — Ambulatory Visit: Payer: Medicare Other | Admitting: Cardiology

## 2022-03-17 ENCOUNTER — Ambulatory Visit (HOSPITAL_COMMUNITY): Payer: Medicare Other | Attending: Internal Medicine

## 2022-03-17 VITALS — BP 142/80 | HR 75 | Ht 66.0 in | Wt 272.0 lb

## 2022-03-17 DIAGNOSIS — I10 Essential (primary) hypertension: Secondary | ICD-10-CM

## 2022-03-17 DIAGNOSIS — I350 Nonrheumatic aortic (valve) stenosis: Secondary | ICD-10-CM

## 2022-03-17 DIAGNOSIS — I361 Nonrheumatic tricuspid (valve) insufficiency: Secondary | ICD-10-CM | POA: Diagnosis not present

## 2022-03-17 DIAGNOSIS — Z952 Presence of prosthetic heart valve: Secondary | ICD-10-CM

## 2022-03-17 DIAGNOSIS — N1832 Chronic kidney disease, stage 3b: Secondary | ICD-10-CM

## 2022-03-17 DIAGNOSIS — I482 Chronic atrial fibrillation, unspecified: Secondary | ICD-10-CM | POA: Diagnosis not present

## 2022-03-17 LAB — ECHOCARDIOGRAM COMPLETE
AV Mean grad: 8 mmHg
AV Peak grad: 12.9 mmHg
Ao pk vel: 1.8 m/s
S' Lateral: 3 cm

## 2022-03-17 NOTE — Patient Instructions (Signed)
Medication Instructions:  Your physician recommends that you continue on your current medications as directed. Please refer to the Current Medication list given to you today.  *If you need a refill on your cardiac medications before your next appointment, please call your pharmacy*   Lab Work: TODAY: CBC, BMET If you have labs (blood work) drawn today and your tests are completely normal, you will receive your results only by: [place] (if you have MyChart) OR A paper copy in the mail If you have any lab test that is abnormal or we need to change your treatment, we will call you to review the results.   Testing/Procedures: NONE   Follow-Up: At [facility], you and your health needs are our priority.  As part of our continuing mission to provide you with exceptional heart care, we have created designated Provider Care Teams.  These Care Teams include your primary Cardiologist (physician) and Advanced Practice Providers (APPs -  Physician Assistants and Nurse Practitioners) who all work together to provide you with the care you need, when you need it.  We recommend signing up for the patient portal called "MyChart".  Sign up information is provided on this After Visit Summary.  MyChart is used to connect with patients for Virtual Visits (Telemedicine).  Patients are able to view lab/test results, encounter notes, upcoming appointments, etc.  Non-urgent messages can be sent to your provider as well.   To learn more about what you can do with MyChart, go to [email or web address].    Your next appointment:   3 month(s) 05/2022  The format for your next appointment:   In Person  Provider:   Shirlee More, MD{    Important Information About [place]

## 2022-03-18 LAB — CBC
Hematocrit: 42.4 % (ref 34.0–46.6)
Hemoglobin: 13.9 g/dL (ref 11.1–15.9)
MCH: 28.8 pg (ref 26.6–33.0)
MCHC: 32.8 g/dL (ref 31.5–35.7)
MCV: 88 fL (ref 79–97)
Platelets: 175 10*3/uL (ref 150–450)
RBC: 4.82 x10E6/uL (ref 3.77–5.28)
RDW: 12.9 % (ref 11.7–15.4)
WBC: 8.7 10*3/uL (ref 3.4–10.8)

## 2022-03-18 LAB — BASIC METABOLIC PANEL
BUN/Creatinine Ratio: 15 (ref 12–28)
BUN: 23 mg/dL (ref 8–27)
CO2: 23 mmol/L (ref 20–29)
Calcium: 9.6 mg/dL (ref 8.7–10.3)
Chloride: 101 mmol/L (ref 96–106)
Creatinine, Ser: 1.53 mg/dL — ABNORMAL HIGH (ref 0.57–1.00)
Glucose: 83 mg/dL (ref 70–99)
Potassium: 3.9 mmol/L (ref 3.5–5.2)
Sodium: 141 mmol/L (ref 134–144)
eGFR: 35 mL/min/{1.73_m2} — ABNORMAL LOW (ref >59)

## 2022-04-01 ENCOUNTER — Ambulatory Visit: Payer: Medicare Other | Admitting: Cardiology

## 2022-04-19 DIAGNOSIS — D485 Neoplasm of uncertain behavior of skin: Secondary | ICD-10-CM | POA: Diagnosis not present

## 2022-04-26 DIAGNOSIS — Z79899 Other long term (current) drug therapy: Secondary | ICD-10-CM | POA: Diagnosis not present

## 2022-04-26 DIAGNOSIS — J449 Chronic obstructive pulmonary disease, unspecified: Secondary | ICD-10-CM | POA: Diagnosis not present

## 2022-04-26 DIAGNOSIS — E7800 Pure hypercholesterolemia, unspecified: Secondary | ICD-10-CM | POA: Diagnosis not present

## 2022-04-26 DIAGNOSIS — I700 Atherosclerosis of aorta: Secondary | ICD-10-CM | POA: Diagnosis not present

## 2022-07-01 ENCOUNTER — Ambulatory Visit: Payer: Medicare Other | Attending: Cardiology | Admitting: Cardiology

## 2022-07-01 ENCOUNTER — Encounter: Payer: Self-pay | Admitting: Cardiology

## 2022-07-01 VITALS — BP 130/80 | HR 80 | Ht 66.0 in | Wt 276.4 lb

## 2022-07-01 DIAGNOSIS — N1832 Chronic kidney disease, stage 3b: Secondary | ICD-10-CM | POA: Diagnosis not present

## 2022-07-01 DIAGNOSIS — Z7901 Long term (current) use of anticoagulants: Secondary | ICD-10-CM

## 2022-07-01 DIAGNOSIS — I480 Paroxysmal atrial fibrillation: Secondary | ICD-10-CM

## 2022-07-01 DIAGNOSIS — I5032 Chronic diastolic (congestive) heart failure: Secondary | ICD-10-CM | POA: Diagnosis not present

## 2022-07-01 DIAGNOSIS — I110 Hypertensive heart disease with heart failure: Secondary | ICD-10-CM | POA: Diagnosis not present

## 2022-07-01 DIAGNOSIS — Z952 Presence of prosthetic heart valve: Secondary | ICD-10-CM

## 2022-07-01 NOTE — Patient Instructions (Signed)
Medication Instructions:  Your physician recommends that you continue on your current medications as directed. Please refer to the Current Medication list given to you today.  *If you need a refill on your cardiac medications before your next appointment, please call your pharmacy*   Lab Work: NONE If you have labs (blood work) drawn today and your tests are completely normal, you will receive your results only by: [place] (if you have MyChart) OR A paper copy in the mail If you have any lab test that is abnormal or we need to change your treatment, we will call you to review the results.   Testing/Procedures: Your physician has requested that you have an echocardiogram. Echocardiography is a painless test that uses sound waves to create images of your heart. It provides your doctor with information about the size and shape of your heart and how well your heart's chambers and valves are working. This procedure takes approximately one hour. There are no restrictions for this procedure. Please do NOT wear cologne, perfume, aftershave, or lotions (deodorant is allowed). Please arrive 15 minutes prior to your appointment time.    Follow-Up: At [facility], you and your health needs are our priority.  As part of our continuing mission to provide you with exceptional heart care, we have created designated Provider Care Teams.  These Care Teams include your primary Cardiologist (physician) and Advanced Practice Providers (APPs -  Physician Assistants and Nurse Practitioners) who all work together to provide you with the care you need, when you need it.  We recommend signing up for the patient portal called "MyChart".  Sign up information is provided on this After Visit Summary.  MyChart is used to connect with patients for Virtual Visits (Telemedicine).  Patients are able to view lab/test results, encounter notes, upcoming appointments, etc.  Non-urgent messages can be sent to your  provider as well.   To learn more about what you can do with MyChart, go to [email or web address].    Your next appointment:   1 year(s)  The format for your next appointment:   In Person  Provider:   Shirlee More, MD    Other Instructions   Important Information About [place]

## 2022-07-01 NOTE — Progress Notes (Signed)
Cardiology Office Note:    Date:  07/01/2022   ID:  Lindsay [name], DOB April 30, 1946, MRN [id number]  PCP:  Ronita Hipps, MD  Cardiologist:  Shirlee More, MD    Referring MD: Ronita Hipps, MD    ASSESSMENT:    1. S/P TAVR (transcatheter aortic valve replacement)   2. Hypertensive heart disease with chronic diastolic congestive heart failure (HCC)   3. Stage 3b chronic kidney disease (HCC)   4. Paroxysmal atrial fibrillation ([place])   5. Chronic anticoagulation    PLAN:    In order of problems listed above:  As expected Lindsay [name] has had a remarkable improvement normalization resolution of heart failure with TAVR.  I told her my practice is to follow the patient's and will do an echocardiogram next July my office and see me yearly. Blood pressure is controlled she trends at home less than 1 17-5 40 systolic we will continue the combination beta-blocker and hydralazine she is no longer taking a loop diuretic Stable CKD No clinical recurrence she will continue her current anticoagulant   Next appointment: 1 year echocardiogram scheduled July 2024   Medication Adjustments/Labs and Tests Ordered: Current medicines are reviewed at length with the patient today.  Concerns regarding medicines are outlined above.  Orders Placed This Encounter  Procedures   ECHOCARDIOGRAM COMPLETE   No orders of the defined types were placed in this encounter.   Chief complaint follow-up after TAVR, history atrial fibrillation anticoagulation and TAVR   History of Present Illness:    Lindsay [name] is a 76 y.o. female with a hx of severe symptomatic aortic stenosis with heart failure who underwent TAVR 02/09/2022.  Other medical problems include chronic atrial fibrillation on long-term anticoagulation obesity with a BMI 44 history of breast cancer with mastectomy and chemotherapy stage IIIb chronic kidney disease last seen by me 03/17/2022.  Her post TAVR echocardiogram showed normal prosthetic valve  function.  Compliance with diet, lifestyle and medications: Yes, she tells me she no longer takes furosemide  She feels remarkably improved normal since TAVR no longer has edema does not take a diuretic no shortness of breath orthopnea chest pain palpitations syncope and she has had no unexplained fever or chill She does not use her anticoagulant without bleeding She has had no muscle pain or weakness from her statin Recent labs 11/24/2021 cholesterol 156 triglycerides 116 A1c 5.7 hemoglobin 13.5 creatinine 1.4 potassium 3.9  Echo 03/17/2022: Summary: Right Carotid: Velocities in the right ICA are consistent with a 1-39% stenosis. Left Carotid: Velocities in the left ICA are consistent with a 1-39% stenosis. Vertebrals: Left vertebral artery demonstrates antegrade flow. Right vertebral artery was not visualized. Subclavians: Normal flow hemodynamics were seen in bilateral subclavian arteries. *See table(s) above for measurements and observations. Past Medical History:  Diagnosis Date   Antineoplastic chemotherapy induced anemia 11/03/2017   Arthritis    Asymptomatic bacteriuria 01/18/2022   Baker's cyst of knee, left 02/12/2020   Chronic atrial fibrillation ([place]) 01/18/2022   Chronic diastolic (congestive) heart failure (HCC)    Chronic kidney disease (CKD), stage IV (severe) (HCC)    Chronic pain of left knee 02/12/2020   COPD (chronic obstructive pulmonary disease) ([place]) 01/18/2022   Diverticulitis 01/18/2022   Essential hypertension    History of kidney stones    History of left breast cancer 12/06/2017   Hypercalcemia 01/18/2022   Hyperlipidemia 04/11/2019   Hypoxia 01/18/2022   Malignant neoplasm of upper-inner quadrant of left breast in female, estrogen  receptor negative ([place]) 06/21/2017   Neutropenic fever ([place]) 01/18/2022   PAF (paroxysmal atrial fibrillation) (HCC)    PAT (paroxysmal atrial tachycardia) 10/30/2016   Personal history of colonic polyps    Personal  history of skin cancer    Port-A-Cath in place 07/01/2017   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 Burt Knack & Dr. Cyndia Bent   Severe aortic stenosis 11/17/2021   SVT (supraventricular tachycardia) 05/31/2018   Uncontrolled hypertension 01/18/2022    Past Surgical History:  Procedure Laterality Date   COLONOSCOPY     FOOT SURGERY Right    INTRAOPERATIVE TRANSTHORACIC ECHOCARDIOGRAM N/A 02/09/2022   Procedure: INTRAOPERATIVE TRANSTHORACIC ECHOCARDIOGRAM;  Surgeon: Sherren Mocha, MD;  Location: [facility];  Service: Open Heart Surgery;  Laterality: N/A;   IR CV LINE INJECTION  08/02/2017   left breast lumpectomy  2000   MASTECTOMY WITH AXILLARY LYMPH NODE DISSECTION Left 12/06/2017   Procedure: LEFT MASTECTOMY WITH TARGETED LYMPH NODE DISSECTION;  Surgeon: Coralie Keens, MD;  Location: [facility];  Service: General;  Laterality: Left;   PORT-A-CATH REMOVAL Right 01/13/2018   Procedure: REMOVAL PORT-A-CATH;  Surgeon: Coralie Keens, MD;  Location: [facility];  Service: General;  Laterality: Right;   PORTACATH PLACEMENT Right 06/30/2017   Procedure: [place];  Surgeon: Coralie Keens, MD;  Location: [facility];  Service: General;  Laterality: Right;   RIGHT HEART CATH AND CORONARY ANGIOGRAPHY N/A 11/23/2021   Procedure: RIGHT HEART CATH AND CORONARY ANGIOGRAPHY;  Surgeon: Sherren Mocha, MD;  Location: [facility];  Service: Cardiovascular;  Laterality: N/A;   TRANSCATHETER AORTIC VALVE REPLACEMENT, TRANSFEMORAL N/A 02/09/2022   Procedure: Transcatheter Aortic Valve Replacement, Transfemoral;  Surgeon: Sherren Mocha, MD;  Location: [facility];  Service: Open Heart Surgery;  Laterality: N/A;    Current Medications: Current Meds  Medication Sig   acetaminophen (TYLENOL) 500 MG tablet Take 1,000 mg by mouth every 6 (six) hours as needed for moderate  pain or headache.   albuterol (VENTOLIN HFA) 108 (90 Base) MCG/ACT inhaler Inhale 2 puffs into the lungs every 4 (four) hours as needed for shortness of breath or wheezing.   apixaban (ELIQUIS) 5 MG TABS tablet Take 1 tablet (5 mg total) by mouth 2 (two) times daily.   atorvastatin (LIPITOR) 10 MG tablet Take 10 mg by mouth at bedtime.   BREO ELLIPTA 100-25 MCG/ACT AEPB Inhale 1 puff into the lungs daily.   hydrALAZINE (APRESOLINE) 25 MG tablet Take 37.5 mg by mouth 3 (three) times daily.   ipratropium-albuterol (DUONEB) 0.5-2.5 (3) MG/3ML SOLN Take 3 mLs by nebulization every 6 (six) hours as needed (shortness of breath).   Magnesium 250 MG TABS Take 250 mg by mouth daily.   metoprolol tartrate (LOPRESSOR) 25 MG tablet Take 1 tablet (25 mg total) by mouth 2 (two) times daily.   montelukast (SINGULAIR) 10 MG tablet Take 10 mg by mouth daily.   Omega-3 1000 MG CAPS Take 1,000 mg by mouth 2 (two) times daily.   potassium chloride (KLOR-CON) 10 MEQ tablet Take 1 tablet (10 mEq total) by mouth daily.     Allergies:   Patient has no known allergies.   Social History   Socioeconomic History   Marital status: Married    Spouse name: Sonia Side   Number of children: 2   Years of education: Not on file   Highest education level:  Not on file  Occupational History   Not on file  Tobacco Use   Smoking status: Former    Packs/day: 2.00    Years: 50.00    Total pack years: 100.00    Types: Cigarettes    Quit date: 06/02/2014    Years since quitting: 8.0    Passive exposure: Past   Smokeless tobacco: Never  Vaping Use   Vaping Use: Never used  Substance and Sexual Activity   Alcohol use: No   Drug use: No   Sexual activity: Yes  Other Topics Concern   Not on file  Social History Narrative   Not on file   Social Determinants of Health   Financial Resource Strain: Not on file  Food Insecurity: Not on file  Transportation Needs: Not on file  Physical Activity: Not on file  Stress: Not on  file  Social Connections: Not on file     Family History: The patient's family history includes Basal cell carcinoma in her sister; Breast cancer in her paternal aunt; Breast cancer (age of onset: 13) in her sister; Breast cancer (age of onset: 66) in her sister; Colon cancer (age of onset: 34) in her sister; Melanoma in her sister; Pancreatic cancer (age of onset: 17) in her mother. ROS:   Please see the history of present illness.    All other systems reviewed and are negative.  EKGs/Labs/Other Studies Reviewed:    The following studies were reviewed today:  Recent Labs: 11/05/2021: NT-Pro BNP 4,799 11/17/2021: B Natriuretic Peptide 765.5 02/05/2022: ALT 20 02/10/2022: Magnesium 2.0 03/17/2022: BUN 23; Creatinine, Ser 1.53; Hemoglobin 13.9; Platelets 175; Potassium 3.9; Sodium 141  Recent Lipid Panel No results found for: "CHOL", "TRIG", "HDL", "CHOLHDL", "VLDL", "LDLCALC", "LDLDIRECT"  Physical Exam:    VS:  BP 130/80   Pulse 80   Ht 5\' 6"  (1.676 m)   Wt 276 lb 6.4 oz (125.4 kg)   SpO2 95%   BMI 44.61 kg/m     Wt Readings from Last 3 Encounters:  07/01/22 276 lb 6.4 oz (125.4 kg)  03/17/22 272 lb (123.4 kg)  02/17/22 274 lb (124.3 kg)     GEN:  Well nourished, well developed in no acute distress HEENT: Normal NECK: No JVD; No carotid bruits LYMPHATICS: No lymphadenopathy CARDIAC:  RRR, no murmurs, rubs, gallops RESPIRATORY:  Clear to auscultation without rales, wheezing or rhonchi  ABDOMEN: Soft, non-tender, non-distended MUSCULOSKELETAL:  No edema; No deformity  SKIN: Warm and dry NEUROLOGIC:  Alert and oriented x 3 PSYCHIATRIC:  Normal affect    Signed, Shirlee More, MD  07/01/2022 3:57 PM    [place]

## 2022-07-07 DIAGNOSIS — C44329 Squamous cell carcinoma of skin of other parts of face: Secondary | ICD-10-CM | POA: Diagnosis not present

## 2022-07-07 DIAGNOSIS — D0421 Carcinoma in situ of skin of right ear and external auricular canal: Secondary | ICD-10-CM | POA: Diagnosis not present

## 2022-07-13 DIAGNOSIS — D485 Neoplasm of uncertain behavior of skin: Secondary | ICD-10-CM | POA: Diagnosis not present

## 2022-07-13 DIAGNOSIS — L814 Other melanin hyperpigmentation: Secondary | ICD-10-CM | POA: Diagnosis not present

## 2022-07-13 DIAGNOSIS — L821 Other seborrheic keratosis: Secondary | ICD-10-CM | POA: Diagnosis not present

## 2022-07-13 DIAGNOSIS — L300 Nummular dermatitis: Secondary | ICD-10-CM | POA: Diagnosis not present

## 2022-08-05 DIAGNOSIS — C44712 Basal cell carcinoma of skin of right lower limb, including hip: Secondary | ICD-10-CM | POA: Diagnosis not present

## 2022-08-09 ENCOUNTER — Other Ambulatory Visit: Payer: Self-pay | Admitting: Physician Assistant

## 2022-08-09 DIAGNOSIS — Z952 Presence of prosthetic heart valve: Secondary | ICD-10-CM

## 2022-08-17 DIAGNOSIS — L02415 Cutaneous abscess of right lower limb: Secondary | ICD-10-CM | POA: Diagnosis not present

## 2022-08-19 DIAGNOSIS — I5032 Chronic diastolic (congestive) heart failure: Secondary | ICD-10-CM | POA: Diagnosis not present

## 2022-08-19 DIAGNOSIS — I129 Hypertensive chronic kidney disease with stage 1 through stage 4 chronic kidney disease, or unspecified chronic kidney disease: Secondary | ICD-10-CM | POA: Diagnosis not present

## 2022-08-19 DIAGNOSIS — N1832 Chronic kidney disease, stage 3b: Secondary | ICD-10-CM | POA: Diagnosis not present

## 2022-08-19 DIAGNOSIS — I350 Nonrheumatic aortic (valve) stenosis: Secondary | ICD-10-CM | POA: Diagnosis not present

## 2022-08-31 ENCOUNTER — Telehealth: Payer: Self-pay | Admitting: Cardiology

## 2022-08-31 ENCOUNTER — Other Ambulatory Visit: Payer: Self-pay | Admitting: Cardiovascular Disease

## 2022-08-31 ENCOUNTER — Other Ambulatory Visit: Payer: Self-pay | Admitting: Cardiology

## 2022-08-31 DIAGNOSIS — I4891 Unspecified atrial fibrillation: Secondary | ICD-10-CM

## 2022-08-31 MED ORDER — APIXABAN 5 MG PO TABS: 5.0000 mg | ORAL_TABLET | Freq: Two times a day (BID) | ORAL | 5 refills | Status: DC

## 2022-08-31 NOTE — Telephone Encounter (Signed)
*  STAT* If patient is at the pharmacy, call can be transferred to refill team.   1. Which medications need to be refilled? (please list name of each medication and dose if known)   ELIQUIS 5 MG TABS tablet  2. Which pharmacy/location (including street and city if local pharmacy) is medication to be sent to?  [id number] - [place], [place] - [address]   3. Do they need a 30 day or 90 day supply?   30 day    Caller stated patient is out of this medication.

## 2022-08-31 NOTE — Telephone Encounter (Addendum)
Prescription refill request for Eliquis received. Indication: afib Last office visit: 07/01/2022 Scr: 1.53, 03/17/2022 Age: 77 yo  Weight: 125.4 kg   Refill sent.

## 2022-08-31 NOTE — Telephone Encounter (Signed)
Prescription refill request for Eliquis received. Indication:Afib  Last office visit: 07/01/22 [facility])  Scr: 1.53 (03/17/22)  Age: 77 Weight: 125.4kg  Appropriate dose and refill sent to requested pharmacy.

## 2022-09-23 ENCOUNTER — Telehealth: Payer: Self-pay

## 2022-09-23 NOTE — Patient Outreach (Signed)
  Care Coordination   09/23/2022 Name: Lindsay [name] MRN: [id number] DOB: 03/13/1946   Care Coordination Outreach Attempts:  An unsuccessful telephone outreach was attempted today to offer the patient information about available care coordination services as a benefit of their health plan.   Follow Up Plan:  Additional outreach attempts will be made to offer the patient care coordination information and services.   Encounter Outcome:  No Answer   Care Coordination Interventions:  No, not indicated   Tomasa Rand, RN, BSN, [facility] [employer] [phone]

## 2022-09-28 ENCOUNTER — Telehealth: Payer: Self-pay

## 2022-09-28 ENCOUNTER — Other Ambulatory Visit: Payer: Self-pay | Admitting: Cardiology

## 2022-09-28 NOTE — Patient Outreach (Signed)
  Care Coordination   Initial Visit Note   09/28/2022 Name: Lindsay [name] MRN: [id number] DOB: 1946-07-03  Lindsay [name] is a 77 y.o. year old female who sees Ronita Hipps, MD for primary care. I spoke with  Lindsay [name] by phone today.  What matters to the patients health and wellness today?  Placed call to patient to review and offer [employer] care coordination program. Patient report she is interested in help with her Eliquis   Goals Addressed               This Visit's Progress     COMPLETED: I need help with medication cost (pt-stated)        Care Coordination Interventions: Pharmacy referral for help with eliquis cost.           SDOH assessments and interventions completed:  No     Care Coordination Interventions:  Yes, provided   Follow up plan: Referral made to pharmacy    Encounter Outcome:  Pt. Visit Completed   Tomasa Rand, RN, BSN, [facility] [place] Coordinator [phone]

## 2022-09-29 ENCOUNTER — Telehealth: Payer: Self-pay | Admitting: Pharmacist

## 2022-09-29 NOTE — Progress Notes (Signed)
[facility])  [place] Team    09/29/2022  Lindsay [name] [birth year] [id number]  Reason for referral: Medication Assistance with Eliquis  Referral source: Dr. Helene Kelp Current insurance: [facility]  Outreach:  Successful telephone call with patient.  HIPAA identifiers verified. Patient expressed concern affording Eliquis  Medication Assistance Findings:  Medication assistance needs identified: Patient did not know her household income. She was informed that the Eliquis program requires patients to spend at least 3% of their income on medication expenses. She communicated understanding.  I could not assess if she has amassed the correct amount of medication expenses because she did not know her income.  Due to this, it is unknown if she qualifies for LIS/Extra Help.  She requested an application be sent to her so she can show her husband and gather the necessary financial documentation.  Patient reported having medication as she paid $47 for a month supply.  Additional medication assistance options reviewed with patient as warranted:  No other options identified  Plan: I will route patient assistance letter to [place] technician who will coordinate patient assistance program application process for medications listed above.  [facility] pharmacy technician will assist with obtaining all required documents from both patient and provider(s) and submit application(s) once completed.  Will follow-up in 2 weeks to see if patient has found her financial records.Elayne Guerin, PharmD, [place] Clinical Pharmacist [phone]

## 2022-09-30 ENCOUNTER — Telehealth: Payer: Self-pay

## 2022-09-30 DIAGNOSIS — Z596 Low income: Secondary | ICD-10-CM

## 2022-09-30 NOTE — Progress Notes (Signed)
[facility])                                            [place] Team    09/30/2022  KIMMORA RISENHOOVER May 04, 1946 [id number]                                         [facility])                                            [place] Team                                                                         Medication Assistance Referral  Referral From: [place]  Medication/Company: Eliquis / [place] Patient application portion:  Education officer, museum portion: Faxed  to Dr. Bettina Gavia Provider address/fax verified via: Office website  Lelon Huh, [place] [phone]

## 2022-10-04 ENCOUNTER — Telehealth: Payer: Self-pay

## 2022-10-04 NOTE — Telephone Encounter (Signed)
Provider portion of [place] patient assistance application for Eliquis received from [employer] / Lelon Huh, CPHT. Forward to Dr Shirlee More.

## 2022-10-14 DIAGNOSIS — D045 Carcinoma in situ of skin of trunk: Secondary | ICD-10-CM | POA: Diagnosis not present

## 2022-10-15 DIAGNOSIS — Z79899 Other long term (current) drug therapy: Secondary | ICD-10-CM | POA: Diagnosis not present

## 2022-10-15 DIAGNOSIS — Z0000 Encounter for general adult medical examination without abnormal findings: Secondary | ICD-10-CM | POA: Diagnosis not present

## 2022-10-15 DIAGNOSIS — E8881 Metabolic syndrome: Secondary | ICD-10-CM | POA: Diagnosis not present

## 2022-10-15 DIAGNOSIS — I10 Essential (primary) hypertension: Secondary | ICD-10-CM | POA: Diagnosis not present

## 2022-10-15 DIAGNOSIS — E7800 Pure hypercholesterolemia, unspecified: Secondary | ICD-10-CM | POA: Diagnosis not present

## 2022-11-16 DIAGNOSIS — Z1231 Encounter for screening mammogram for malignant neoplasm of breast: Secondary | ICD-10-CM | POA: Diagnosis not present

## 2022-11-25 ENCOUNTER — Telehealth: Payer: Self-pay | Admitting: Pharmacy Technician

## 2022-11-25 DIAGNOSIS — Z596 Low income: Secondary | ICD-10-CM

## 2022-11-25 NOTE — Progress Notes (Signed)
[facility])                                            [place] Team    11/25/2022  Lindsay [name] 06/04/1946 [id number]   Care coordination call placed to [employer] in regard to ELiquis application.  Spoke to [employer] who informs patient needs to spend an additional $152.68 OOP to qualify. She informs the OOP requirement is $747.5 based on adjusted gross income on tax return. Patient submitted an  OOP of $141.00 for 2024. BMS gave an OOP credit of [phone]. This leaves a balance of $152.68 for patient to spend in 2024 in order to qualify for the program.  Lindsay [name], [place]  [phone]

## 2022-11-29 DIAGNOSIS — Z1212 Encounter for screening for malignant neoplasm of rectum: Secondary | ICD-10-CM | POA: Diagnosis not present

## 2022-11-29 DIAGNOSIS — Z1211 Encounter for screening for malignant neoplasm of colon: Secondary | ICD-10-CM | POA: Diagnosis not present

## 2022-12-28 DIAGNOSIS — I10 Essential (primary) hypertension: Secondary | ICD-10-CM | POA: Diagnosis not present

## 2022-12-28 DIAGNOSIS — N1832 Chronic kidney disease, stage 3b: Secondary | ICD-10-CM | POA: Diagnosis not present

## 2022-12-28 DIAGNOSIS — I480 Paroxysmal atrial fibrillation: Secondary | ICD-10-CM | POA: Diagnosis not present

## 2023-01-28 DIAGNOSIS — N1832 Chronic kidney disease, stage 3b: Secondary | ICD-10-CM | POA: Diagnosis not present

## 2023-01-28 DIAGNOSIS — I10 Essential (primary) hypertension: Secondary | ICD-10-CM | POA: Diagnosis not present

## 2023-01-28 DIAGNOSIS — I480 Paroxysmal atrial fibrillation: Secondary | ICD-10-CM | POA: Diagnosis not present

## 2023-02-06 ENCOUNTER — Other Ambulatory Visit: Payer: Self-pay | Admitting: Cardiology

## 2023-02-14 ENCOUNTER — Ambulatory Visit: Payer: Medicare Other | Attending: Cardiology

## 2023-02-14 DIAGNOSIS — Z952 Presence of prosthetic heart valve: Secondary | ICD-10-CM

## 2023-02-14 LAB — ECHOCARDIOGRAM COMPLETE
AR max vel: 1.42 cm2
AV Area VTI: 1.53 cm2
AV Area mean vel: 1.4 cm2
AV Mean grad: 10 mmHg
AV Peak grad: 19 mmHg
Ao pk vel: 2.18 m/s
Calc EF: 47 %
MV M vel: 5.93 m/s
MV Peak grad: 140.7 mmHg
MV VTI: 1.89 cm2
Radius: 0.6 cm
S' Lateral: 3.6 cm
Single Plane A2C EF: 49.3 %
Single Plane A4C EF: 50.6 %

## 2023-02-17 ENCOUNTER — Telehealth: Payer: Self-pay | Admitting: Pharmacy Technician

## 2023-02-17 DIAGNOSIS — Z5986 Financial insecurity: Secondary | ICD-10-CM

## 2023-02-17 NOTE — Progress Notes (Signed)
[facility] Customer service manager [facility])                                            [facility] Pharm[facility] Team    02/17/2023  Lindsay [name] Nov 07, 1945 [id number]  Received updated out of pocket expenditure from pharmacy for Eliquis. Faxed completed application and required documents into BMS.    Pattricia Boss, CPhT [facility] Office: [phone] Fax: [phone] Email: [name]

## 2023-02-18 NOTE — Telephone Encounter (Signed)
Patient has been approved for patient assistance for Eliquis from 02/17/23 until 08/30/23.

## 2023-02-20 NOTE — Progress Notes (Addendum)
Cardiology Office Note:    Date:  02/21/2023   ID:  Lindsay [name], DOB 08-Feb-1946, MRN [id number]  PCP:  Marylen Ponto, MD  Cardiologist:  Norman Herrlich, MD    Referring MD: Marylen Ponto, MD    ASSESSMENT:    1. S/P TAVR (transcatheter aortic valve replacement)   2. Permanent atrial fibrillation (HCC)   3. Chronic anticoagulation   4. Hypertensive heart disease with chronic diastolic congestive heart failure (HCC)   5. Stage 3b chronic kidney disease (HCC)   6. Nonrheumatic mitral valve stenosis    PLAN:    In order of problems listed above:  She has done well normal TAVR function resolution of signs and symptoms of heart failure we will plan repeat echocardiogram in the office 1 year. NYHA class I symptoms Rate controlled with her current beta-blocker and continue her anticoagulant therapy for stroke prophylaxis Well-controlled no fluid overload continue her current loop diuretic along with beta-blocker and hydralazine.  Labs will be followed in her PCP office in August. Natural history of degenerative calcific mitral valve disease is generally slowly progressive and rarely requires intervention.   Next appointment: 6 months   Medication Adjustments/Labs and Tests Ordered: Current medicines are reviewed at length with the patient today.  Concerns regarding medicines are outlined above.  Orders Placed This Encounter  Procedures   EKG 12-Lead   No orders of the defined types were placed in this encounter.      History of Present Illness:    Lindsay [name] is a 77 y.o. female with a hx of symptomatic aortic stenosis with heart failure and TAVR 02/08/2022, chronic atrial fibrillation on long-term anticoagulant therapy obesity with a BMI exceeding 40 history of breast cancer with mastectomy and chemotherapy stage IIIb CKD last seen 07/01/2022.  Last echocardiogram 02/14/2023 showed normal TAVR function ejection fraction 60 to 65% with mild LVH and reduced GLS pulmonary  artery pressure was described as moderately elevated and although the mitral valve is described as normal there is mild mitral stenosis present with a mean gradient of 4 mmHg.  I independently reviewed the echocardiogram the valve is degenerative and there is mild to moderate mitral annular calcification seen.  Compliance with diet, lifestyle and medications: Yes  She is reassured by the results of her cardiac echo normal TAVR function and mild nonrheumatic calcific mitral stenosis not causing a current problem. Overall is doing well Home blood pressure runs 140/80 or less her edema is controlled with her loop diuretic not having shortness of breath chest pain palpitation or syncope She tolerates her anticoagulant without bleeding complication She is on lipid-lowering therapy with a cholesterol of 166 triglycerides 121 HDL 54 10/15/2022 hemoglobin was 14.1 with a creatinine 1.3 and a potassium of 3.90 Her predominant problem is joint pain Past Medical History:  Diagnosis Date   Antineoplastic chemotherapy induced anemia 11/03/2017   Arthritis    Asymptomatic bacteriuria 01/18/2022   Baker's cyst of knee, left 02/12/2020   Chronic atrial fibrillation (HCC) 01/18/2022   Chronic diastolic (congestive) heart failure (HCC)    Chronic kidney disease (CKD), stage IV (severe) (HCC)    Chronic pain of left knee 02/12/2020   COPD (chronic obstructive pulmonary disease) (HCC) 01/18/2022   Diverticulitis 01/18/2022   Essential hypertension    History of kidney stones    History of left breast cancer 12/06/2017   Hypercalcemia 01/18/2022   Hyperlipidemia 04/11/2019   Hypoxia 01/18/2022   Malignant neoplasm of upper-inner quadrant of left  breast in female, estrogen receptor negative (HCC) 06/21/2017   Neutropenic fever (HCC) 01/18/2022   PAF (paroxysmal atrial fibrillation) (HCC)    PAT (paroxysmal atrial tachycardia) 10/30/2016   Personal history of colonic polyps    Personal history of skin  cancer    Port-A-Cath in place 07/01/2017   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 Excell Seltzer & Dr. Laneta Simmers   Severe aortic stenosis 11/17/2021   SVT (supraventricular tachycardia) 05/31/2018   Uncontrolled hypertension 01/18/2022    Past Surgical History:  Procedure Laterality Date   COLONOSCOPY     FOOT SURGERY Right    INTRAOPERATIVE TRANSTHORACIC ECHOCARDIOGRAM N/A 02/09/2022   Procedure: INTRAOPERATIVE TRANSTHORACIC ECHOCARDIOGRAM;  Surgeon: Tonny Bollman, MD;  Location: [facility] INVASIVE CV LAB;  Service: Open Heart Surgery;  Laterality: N/A;   IR CV LINE INJECTION  08/02/2017   left breast lumpectomy  2000   MASTECTOMY WITH AXILLARY LYMPH NODE DISSECTION Left 12/06/2017   Procedure: LEFT MASTECTOMY WITH TARGETED LYMPH NODE DISSECTION;  Surgeon: Abigail Miyamoto, MD;  Location: [facility] OR;  Service: General;  Laterality: Left;   PORT-A-CATH REMOVAL Right 01/13/2018   Procedure: REMOVAL PORT-A-CATH;  Surgeon: Abigail Miyamoto, MD;  Location: [facility];  Service: General;  Laterality: Right;   PORTACATH PLACEMENT Right 06/30/2017   Procedure: INSERTION PORT-A-CATH ERAS PATHWAY;  Surgeon: Abigail Miyamoto, MD;  Location: [facility] OR;  Service: General;  Laterality: Right;   RIGHT HEART CATH AND CORONARY ANGIOGRAPHY N/A 11/23/2021   Procedure: RIGHT HEART CATH AND CORONARY ANGIOGRAPHY;  Surgeon: Tonny Bollman, MD;  Location: [facility] INVASIVE CV LAB;  Service: Cardiovascular;  Laterality: N/A;   TRANSCATHETER AORTIC VALVE REPLACEMENT, TRANSFEMORAL N/A 02/09/2022   Procedure: Transcatheter Aortic Valve Replacement, Transfemoral;  Surgeon: Tonny Bollman, MD;  Location: [facility] INVASIVE CV LAB;  Service: Open Heart Surgery;  Laterality: N/A;    Current Medications: Current Meds  Medication Sig   acetaminophen (TYLENOL) 500 MG tablet Take 1,000 mg by mouth every 6 (six) hours as needed for moderate pain or headache.    albuterol (VENTOLIN HFA) 108 (90 Base) MCG/ACT inhaler Inhale 2 puffs into the lungs every 4 (four) hours as needed for shortness of breath or wheezing.   apixaban (ELIQUIS) 5 MG TABS tablet Take 1 tablet (5 mg total) by mouth 2 (two) times daily.   atorvastatin (LIPITOR) 10 MG tablet Take 10 mg by mouth at bedtime.   BREO ELLIPTA 100-25 MCG/ACT AEPB Inhale 1 puff into the lungs daily.   furosemide (LASIX) 40 MG tablet Take 40 mg by mouth daily.   hydrALAZINE (APRESOLINE) 25 MG tablet Take 37.5 mg by mouth 3 (three) times daily.   Magnesium 250 MG TABS Take 250 mg by mouth daily.   metoprolol tartrate (LOPRESSOR) 25 MG tablet Take 1 tablet (25 mg total) by mouth 2 (two) times daily.   montelukast (SINGULAIR) 10 MG tablet Take 10 mg by mouth daily.   Omega-3 1000 MG CAPS Take 1,000 mg by mouth 2 (two) times daily.   potassium chloride (KLOR-CON) 10 MEQ tablet Take 1 tablet (10 mEq total) by mouth daily.     Allergies:   Patient has no known allergies.   Social History   Socioeconomic History   Marital status: Married    Spouse name: Dorene Sorrow   Number of children: 2   Years of education: Not on file   Highest education level: Not on file  Occupational  History   Not on file  Tobacco Use   Smoking status: Former    Packs/day: 2.00    Years: 50.00    Additional pack years: 0.00    Total pack years: 100.00    Types: Cigarettes    Quit date: 06/02/2014    Years since quitting: 8.7    Passive exposure: Past   Smokeless tobacco: Never  Vaping Use   Vaping Use: Never used  Substance and Sexual Activity   Alcohol use: No   Drug use: No   Sexual activity: Yes  Other Topics Concern   Not on file  Social History Narrative   Not on file   Social Determinants of Health   Financial Resource Strain: Not on file  Food Insecurity: Not on file  Transportation Needs: Not on file  Physical Activity: Not on file  Stress: Not on file  Social Connections: Not on file     Family  History: The patient's  family history includes Basal cell carcinoma in her sister; Breast cancer in her paternal aunt; Breast cancer (age of onset: 30) in her sister; Breast cancer (age of onset: 49) in her sister; Colon cancer (age of onset: 73) in her sister; Melanoma in her sister; Pancreatic cancer (age of onset: 13) in her mother. ROS:   Please see the history of present illness.    All other systems reviewed and are negative.  EKGs/Labs/Other Studies Reviewed:    The following studies were reviewed today:  EKG Interpretation  Date/Time:  Monday February 21 2023 16:11:07 EDT Ventricular Rate:  80 PR Interval:    QRS Duration: 110 QT Interval:  416 QTC Calculation: 479 R Axis:   -18 Text Interpretation: Atrial fibrillation CONTROLLED VENTRICULAR RESPONSE 1 PVC Moderate voltage criteria for LVH, may be normal variant ( R in aVL , Cornell product ) Late transition When compared with ECG of 10-Feb-2022 05:13, T wave amplitude has increased in Inferior leads T wave inversion less evident in Lateral leads Confirmed by Norman Herrlich ([id number]) on 02/21/2023 4:14:46 PM   Cardiac Studies & Procedures   CARDIAC CATHETERIZATION  CARDIAC CATHETERIZATION 11/23/2021  Narrative   Hemodynamic findings consistent with mild pulmonary hypertension.  1.  Known severe aortic stenosis by noninvasive assessment 2.  Widely patent coronary arteries with minimal irregularity, no significant coronary stenoses 3.  Elevated pulmonary capillary wedge pressure consistent with congestive heart failure/high filling pressures 4.  Mild pulmonary hypertension with PA pressure 54/23 mean 34 mmHg, transpulmonary gradient 6 mmHg, PVR less than 1 Woods unit  Recommend: Continue diuresis (lasix 40 mg IV ordered post-cath x 1) Continue TAVR evaluation Resume heparin post-procedure, transition to apixaban pending case manager consult (see notes patient unable to afford apixaban pre-hospital)  Findings Coronary  Findings Diagnostic  Dominance: Right  Left Main The vessel exhibits minimal luminal irregularities.  Left Anterior Descending The vessel exhibits minimal luminal irregularities. Widely patent LAD to the apex, patent diagonals, no significant stenoses  Left Circumflex The vessel exhibits minimal luminal irregularities. The circumflex is widely patent with 2 large OM&#39;s with no significant stenoses  Right Coronary Artery The vessel exhibits minimal luminal irregularities. Widely patent, dominant RCA with no obstructive disease.  The PDA and PLA branches are patent with no obstruction.  Intervention  No interventions have been documented.     ECHOCARDIOGRAM  ECHOCARDIOGRAM COMPLETE 02/14/2023  Narrative ECHOCARDIOGRAM REPORT    Patient Name:   Lindsay [name] Date of Exam: 02/14/2023 Medical Rec #:  [id number]  Height:       66.0 in Accession #:    [id number]   Weight:       276.4 lb Date of Birth:  1946/02/13     BSA:          2.295 m Patient Age:    77 years     BP:           130/80 mmHg Patient Gender: F            HR:           81 bpm. Exam Location:  [place]  Procedure: 2D Echo, Cardiac Doppler, Color Doppler and Strain Analysis  Indications:    S/P TAVR (transcatheter aortic valve replacement) [U98.1 (ICD-10-CM)]  History:        Patient has prior history of Echocardiogram examinations, most recent 03/17/2022. COPD, Aortic Valve Disease; Risk Factors:Hypertension.  Sonographer:    Louie Boston RDCS Referring Phys: [phone] JILL D MCDANIEL  IMPRESSIONS   1. Left ventricular ejection fraction, by estimation, is 60 to 65%. The left ventricle has normal function. The left ventricle has no regional wall motion abnormalities. There is mild left ventricular hypertrophy. Left ventricular diastolic parameters are indeterminate. The average left ventricular global longitudinal strain is -11.2 %. The global longitudinal strain is abnormal. 2. Right ventricular systolic  function is normal. The right ventricular size is normal. There is moderately elevated pulmonary artery systolic pressure. 3. Left atrial size was severely dilated. 4. Right atrial size was moderately dilated. 5. The mitral valve is normal in structure. Mild to moderate mitral valve regurgitation. Mild mitral stenosis. 6. The aortic valve has been repaired/replaced. Aortic valve regurgitation is not visualized. No aortic stenosis is present. Procedure Date: 02/10/2022. Echo findings are consistent with normal structure and function of the aortic valve prosthesis. Aortic valve mean gradient measures 10.0 mmHg. 7. The inferior vena cava is normal in size with greater than 50% respiratory variability, suggesting right atrial pressure of 3 mmHg.  FINDINGS Left Ventricle: Left ventricular ejection fraction, by estimation, is 60 to 65%. The left ventricle has normal function. The left ventricle has no regional wall motion abnormalities. The average left ventricular global longitudinal strain is -11.2 %. The global longitudinal strain is abnormal. The left ventricular internal cavity size was normal in size. There is mild left ventricular hypertrophy. Left ventricular diastolic parameters are indeterminate.  Right Ventricle: The right ventricular size is normal. No increase in right ventricular wall thickness. Right ventricular systolic function is normal. There is moderately elevated pulmonary artery systolic pressure. The tricuspid regurgitant velocity is 3.40 m/s, and with an assumed right atrial pressure of 3 mmHg, the estimated right ventricular systolic pressure is 49.2 mmHg.  Left Atrium: Left atrial size was severely dilated.  Right Atrium: Right atrial size was moderately dilated.  Pericardium: There is no evidence of pericardial effusion.  Mitral Valve: The mitral valve is normal in structure. Mild to moderate mitral valve regurgitation. Mild mitral valve stenosis. MV peak gradient, 11.3 mmHg.  The mean mitral valve gradient is 4.0 mmHg.  Tricuspid Valve: The tricuspid valve is normal in structure. Tricuspid valve regurgitation is mild . No evidence of tricuspid stenosis.  Aortic Valve: The aortic valve has been repaired/replaced. Aortic valve regurgitation is not visualized. No aortic stenosis is present. Aortic valve mean gradient measures 10.0 mmHg. Aortic valve peak gradient measures 19.0 mmHg. Aortic valve area, by VTI measures 1.53 cm. There is a 26 mm Sapien prosthetic, stented (TAVR) valve present in the  aortic position. Echo findings are consistent with normal structure and function of the aortic valve prosthesis.  Pulmonic Valve: The pulmonic valve was normal in structure. Pulmonic valve regurgitation is mild. No evidence of pulmonic stenosis.  Aorta: The aortic root is normal in size and structure.  Venous: The inferior vena cava is normal in size with greater than 50% respiratory variability, suggesting right atrial pressure of 3 mmHg.  IAS/Shunts: No atrial level shunt detected by color flow Doppler.   LEFT VENTRICLE PLAX 2D LVIDd:         4.10 cm     Diastology LVIDs:         3.60 cm     LV e' medial:    6.20 cm/s LV PW:         1.40 cm     LV E/e' medial:  24.8 LV IVS:        1.40 cm     LV e' lateral:   10.70 cm/s LVOT diam:     2.10 cm     LV E/e' lateral: 14.4 LV SV:         64 LV SV Index:   28          2D Longitudinal Strain LVOT Area:     3.46 cm    2D Strain GLS Avg:     -11.2 %  LV Volumes (MOD) LV vol d, MOD A2C: 63.5 ml LV vol d, MOD A4C: 78.0 ml LV vol s, MOD A2C: 32.2 ml LV vol s, MOD A4C: 38.5 ml LV SV MOD A2C:     31.3 ml LV SV MOD A4C:     78.0 ml LV SV MOD BP:      33.3 ml  RIGHT VENTRICLE             IVC RV Basal diam:  3.90 cm     IVC diam: 2.00 cm RV S prime:     12.10 cm/s TAPSE (M-mode): 1.7 cm  LEFT ATRIUM              Index        RIGHT ATRIUM           Index LA diam:        4.60 cm  2.00 cm/m   RA Area:     26.50 cm LA  Vol (A2C):   124.0 ml 54.04 ml/m  RA Volume:   94.10 ml  41.01 ml/m LA Vol (A4C):   103.0 ml 44.89 ml/m LA Biplane Vol: 115.0 ml 50.12 ml/m AORTIC VALVE                     PULMONIC VALVE AV Area (Vmax):    1.42 cm      PR End Diast Vel: 8.76 msec AV Area (Vmean):   1.40 cm AV Area (VTI):     1.53 cm AV Vmax:           217.67 cm/s AV Vmean:          147.000 cm/s AV VTI:            0.418 m AV Peak Grad:      19.0 mmHg AV Mean Grad:      10.0 mmHg LVOT Vmax:         89.20 cm/s LVOT Vmean:        59.433 cm/s LVOT VTI:          0.185 m LVOT/AV VTI ratio: 0.44  AORTA Ao Root diam: 2.50 cm Ao Asc diam:  3.40 cm Ao Desc diam: 2.30 cm  MITRAL VALVE                  TRICUSPID VALVE MV Area VTI:  1.89 cm        TR Peak grad:   46.2 mmHg MV Peak grad: 11.3 mmHg       TR Vmax:        340.00 cm/s MV Mean grad: 4.0 mmHg MV Vmax:      1.68 m/s        SHUNTS MV Vmean:     94.3 cm/s       Systemic VTI:  0.18 m MR Peak grad:    140.7 mmHg   Systemic Diam: 2.10 cm MR Mean grad:    96.0 mmHg MR Vmax:         593.00 cm/s MR Vmean:        456.0 cm/s MR PISA:         2.26 cm MR PISA Eff ROA: 14 mm MR PISA Radius:  0.60 cm MV E velocity: 154.00 cm/s MV A velocity: 70.00 cm/s MV E/A ratio:  2.20  Gypsy Balsam MD Electronically signed by Gypsy Balsam MD Signature Date/Time: 02/14/2023/5:19:47 PM    Final    MONITORS  LONG TERM MONITOR (3-14 DAYS) 05/25/2021  Narrative Patch Wear Time:  6 days and 18 hours (2022-09-09T14:46:12-398 to 2022-09-16T09:03:19-0400)  Atrial Fibrillation occurred continuously (100% burden), ranging from 47-116 bpm (avg of 74 bpm). Isolated VEs were occasional (2.0%, 13235), VE Couplets were rare (<1.0%, 413), and VE Triplets were rare (<1.0%, 17). Ventricular Trigeminy was present.  There were 6 triggered events all atrial fibrillation with a controlled ventricular rate and to had a single PVC present. Atrial fibrillation is present throughout  with good heart rate control in range 50 to 110 bpm average heart rate 74 bpm. Ventricular ectopy was occasional with 413 couplets 17 triplets.  Conclusion atrial fibrillation with good heart rate control, occasional ventricular ectopy.   CT SCANS  CT CORONARY MORPH W/CTA COR W/SCORE 12/28/2021  Addendum 12/28/2021 11:45 AM ADDENDUM REPORT: 12/28/2021 11:42  CLINICAL DATA:  Aortic valve replacement (TAVR), pre-op eval  EXAM: Cardiac TAVR CT  TECHNIQUE: The patient was scanned on a Siemens Force 192 slice scanner. A 120 kV retrospective scan was triggered in the descending thoracic aorta at 111 HU's. Gantry rotation speed was 270 msecs and collimation was .9 mm. The 3D data set was reconstructed in 5% intervals of the R-R cycle. Systolic and diastolic phases were analyzed on a dedicated work station using MPR, MIP and VRT modes. The patient received OMNIPAQUE IOHEXOL 350 MG/ML SOLN of contrast.  FINDINGS: Image quality: Average  Noise  Aortic Valve: Tricuspid aortic valve. Severely reduced cusp separation. Severely thickened, severely calcified aortic valve cusps.  AV calcium score: 1549  Virtual Basal Annulus Measurements:  Maximum/Minimum Diameter: 28.4 x 22.7 mm  Perimeter: 80.9 mm  Area:  496 mm2  No significant LVOT calcifications.  Based on these measurements, the annulus would be suitable for a 26 mm valve.  Sinus of Valsalva Measurements:  Non-coronary:  33 mm  Right - coronary:  32 mm  Left - coronary:  33 mm  Sinus of Valsalva Height:  Left: 23.8 mm  Right: 22.7 mm  Aorta: 4 vessel branch pattern of aortic arch with the right subclavian artery and right common carotid artery originating directly off aortic arch. Severe aortic  atherosclerosis.  Sinotubular Junction:  30 mm  Ascending Thoracic Aorta:  35 mm  Aortic Arch:  28 mm  Descending Thoracic Aorta:  29 mm  Coronary Artery Height above Annulus:  Left main: 16.7 mm  Right  coronary: 17.4 mm  Coronary Arteries: Normal coronary origin. Right dominance. The study was performed without use of NTG and insufficient for plaque evaluation.  Optimum Fluoroscopic Angle for Delivery: LAO 3, CAU 4  Moderate mitral annular calcifications.  No left atrial appendage thrombus.  IMPRESSION: 1. Tricuspid aortic valve with severely reduced cusp excursion. Severely thickened and severely calcified aortic valve cusps.  2.  Aortic valve calcium score: 1549  3. Annulus area: 496 mm2, suitable for 26 mm Sapien 3 valve. No LVOT calcifications.  4.  Sufficient coronary artery heights from annulus.  5. Optimum fluoroscopic angle for delivery: LAO 3, CAU 4   Electronically Signed By: Weston Brass M.D. On: 12/28/2021 11:42  Narrative EXAM: OVER-READ INTERPRETATION  CT CHEST  The following report is an over-read performed by radiologist Dr. Allegra Lai of [facility] on 12/25/2021. This over-read does not include interpretation of cardiac or coronary anatomy or pathology. The coronary calcium score/coronary CTA interpretation by the cardiologist is attached.  COMPARISON:  None.  FINDINGS: Extracardiac findings will be described separately under dictation for contemporaneously obtained CTA chest, abdomen and pelvis.  IMPRESSION: Please see separate dictation for contemporaneously obtained CTA chest, abdomen and pelvis dated 12/25/2021 for full description of relevant extracardiac findings.  Electronically Signed: By: Allegra Lai M.D. On: 12/25/2021 12:03   CT SCANS  CT CARDIAC SCORING (SELF PAY ONLY) 06/16/2021  Addendum 06/16/2021 10:01 PM ADDENDUM REPORT: 06/16/2021 21:59  CLINICAL DATA:  Cardiovascular disease risk stratification  EXAM: CT Coronary Calcium Score  TECHNIQUE: A gated, non-contrast computed tomography scan of the heart was performed using 3mm slice thickness. Axial images were analyzed on a dedicated  workstation. Calcium scoring of the coronary arteries was performed using the Agatston method.  FINDINGS: Aortic valve calcium score: 1169  Coronary arteries: Normal origins.  Coronary Calcium Score:  Left main: 114  Left anterior descending artery: 113  Left circumflex artery: 0  Right coronary artery: 3.3  Total: 230  Percentile: 70th  Pericardium: Normal.  Ascending Aorta: Grossly normal caliber. Ascending aorta measures approximately 37mm at the mid ascending aorta measured in an axial plane.  Severe mitral annular calcification.  Non-cardiac: See separate report from [facility].  IMPRESSION: Aortic valve calcium score: 1169  Coronary calcium score of 230. This was 70th percentile for age-, race-, and sex-matched controls.  RECOMMENDATIONS: Coronary artery calcium (CAC) score is a strong predictor of incident coronary heart disease (CHD) and provides predictive information beyond traditional risk factors. CAC scoring is reasonable to use in the decision to withhold, postpone, or initiate statin therapy in intermediate-risk or selected borderline-risk asymptomatic adults (age 78-75 years and LDL-C >=70 to <190 mg/dL) who do not have diabetes or established atherosclerotic cardiovascular disease (ASCVD).* In intermediate-risk (10-year ASCVD risk >=7.5% to <20%) adults or selected borderline-risk (10-year ASCVD risk >=5% to <7.5%) adults in whom a CAC score is measured for the purpose of making a treatment decision the following recommendations have been made:  If CAC=0, it is reasonable to withhold statin therapy and reassess in 5 to 10 years, as long as higher risk conditions are absent (diabetes mellitus, family history of premature CHD in first degree relatives (males <55 years; females <65 years), cigarette smoking, or LDL >=190 mg/dL).  If CAC is 1 to 99, it is reasonable to initiate statin therapy for patients >=3 years of age.  If CAC  is >=100 or >=75th percentile, it is reasonable to initiate statin therapy at any age.  Cardiology referral should be considered for patients with CAC scores >=400 or >=75th percentile.  *2018 AHA/ACC/AACVPR/AAPA/ABC/ACPM/ADA/AGS/APhA/ASPC/NLA/PCNA Guideline on the Management of Blood Cholesterol: A Report of the American College of Cardiology/American Heart Association Task Force on Clinical Practice Guidelines. J Am Coll Cardiol. 2019;73(24):3168-3209.   Electronically Signed By: Weston Brass M.D. On: 06/16/2021 21:59  Narrative EXAM: OVER-READ INTERPRETATION  CT CHEST  The following report is an over-read performed by radiologist Dr. Irish Lack of [facility] on 06/16/2021. This over-read does not include interpretation of cardiac or coronary anatomy or pathology. The coronary calcium score interpretation by the cardiologist is attached.  COMPARISON:  None.  FINDINGS: Vascular: Thoracic aortic atherosclerosis.  Mediastinum/Nodes: Visualized mediastinum and hilar regions demonstrate no lymphadenopathy or masses. There are some small nonenlarged mediastinal lymph nodes.  Lungs/Pleura: Visualized lungs show no evidence of pulmonary edema, consolidation, pneumothorax, nodule or pleural fluid.  Upper Abdomen: No acute abnormality.  Musculoskeletal: Status post left mastectomy. No bony abnormalities are identified in the visualized chest.  IMPRESSION: Atherosclerosis of the thoracic aorta. No significant incidental findings.  Electronically Signed: By: Irish Lack M.D. On: 06/16/2021 14:50          EKG Interpretation  Date/Time:  Monday February 21 2023 16:11:07 EDT Ventricular Rate:  80 PR Interval:    QRS Duration: 110 QT Interval:  416 QTC Calculation: 479 R Axis:   -18 Text Interpretation: Atrial fibrillation CONTROLLED VENTRICULAR RESPONSE 1 PVC Moderate voltage criteria for LVH, may be normal variant ( R in aVL , [employer] product )  Late transition When compared with ECG of 10-Feb-2022 05:13, T wave amplitude has increased in Inferior leads T wave inversion less evident in Lateral leads Confirmed by Norman Herrlich ([id number]) on 02/21/2023 4:14:46 PM    Recent Labs: 03/17/2022: BUN 23; Creatinine, Ser 1.53; Hemoglobin 13.9; Platelets 175; Potassium 3.9; Sodium 141  Recent Lipid Panel No results found for: "CHOL", "TRIG", "HDL", "CHOLHDL", "VLDL", "LDLCALC", "LDLDIRECT"  Physical Exam:    VS:  BP (!) 160/80 (BP Location: Right Arm, Patient Position: Sitting, Cuff Size: Normal)   Pulse 80   Ht 5\' 6"  (1.676 m)   Wt 293 lb (132.9 kg)   SpO2 95%   BMI 47.29 kg/m     Wt Readings from Last 3 Encounters:  02/21/23 293 lb (132.9 kg)  07/01/22 276 lb 6.4 oz (125.4 kg)  03/17/22 272 lb (123.4 kg)     GEN: Obese well nourished, well developed in no acute distress HEENT: Normal NECK: No JVD; No carotid bruits LYMPHATICS: No lymphadenopathy CARDIAC: Irregular rhythm variable first heart sound no murmurs, rubs, gallops RESPIRATORY:  Clear to auscultation without rales, wheezing or rhonchi  ABDOMEN: Soft, non-tender, non-distended MUSCULOSKELETAL:  No edema; No deformity  SKIN: Warm and dry NEUROLOGIC:  Alert and oriented x 3 PSYCHIATRIC:  Normal affect    Signed, Norman Herrlich, MD  02/21/2023 4:35 PM    [facility]

## 2023-02-21 ENCOUNTER — Ambulatory Visit: Payer: Medicare Other | Attending: Cardiology | Admitting: Cardiology

## 2023-02-21 ENCOUNTER — Encounter: Payer: Self-pay | Admitting: Cardiology

## 2023-02-21 VITALS — BP 160/80 | HR 80 | Ht 66.0 in | Wt 293.0 lb

## 2023-02-21 DIAGNOSIS — I342 Nonrheumatic mitral (valve) stenosis: Secondary | ICD-10-CM | POA: Diagnosis not present

## 2023-02-21 DIAGNOSIS — I110 Hypertensive heart disease with heart failure: Secondary | ICD-10-CM | POA: Diagnosis not present

## 2023-02-21 DIAGNOSIS — Z7901 Long term (current) use of anticoagulants: Secondary | ICD-10-CM

## 2023-02-21 DIAGNOSIS — I5032 Chronic diastolic (congestive) heart failure: Secondary | ICD-10-CM

## 2023-02-21 DIAGNOSIS — Z952 Presence of prosthetic heart valve: Secondary | ICD-10-CM

## 2023-02-21 DIAGNOSIS — N1832 Chronic kidney disease, stage 3b: Secondary | ICD-10-CM

## 2023-02-21 DIAGNOSIS — I4821 Permanent atrial fibrillation: Secondary | ICD-10-CM | POA: Diagnosis not present

## 2023-02-21 NOTE — Patient Instructions (Signed)
Medication Instructions:  Your physician recommends that you continue on your current medications as directed. Please refer to the Current Medication list given to you today.  *If you need a refill on your cardiac medications before your next appointment, please call your pharmacy*   Lab Work: None If you have labs (blood work) drawn today and your tests are completely normal, you will receive your results only by: MyChart Message (if you have MyChart) OR A paper copy in the mail If you have any lab test that is abnormal or we need to change your treatment, we will call you to review the results.   Testing/Procedures: None   Follow-Up: At [facility], you and your health needs are our priority.  As part of our continuing mission to provide you with exceptional heart care, we have created designated Provider Care Teams.  These Care Teams include your primary Cardiologist (physician) and Advanced Practice Providers (APPs -  Physician Assistants and Nurse Practitioners) who all work together to provide you with the care you need, when you need it.  We recommend signing up for the patient portal called "MyChart".  Sign up information is provided on this After Visit Summary.  MyChart is used to connect with patients for Virtual Visits (Telemedicine).  Patients are able to view lab/test results, encounter notes, upcoming appointments, etc.  Non-urgent messages can be sent to your provider as well.   To learn more about what you can do with MyChart, go to [email or web address].    Your next appointment:   6 month(s)  Provider:   Brian Munley, MD    Other Instructions None  

## 2023-02-23 ENCOUNTER — Telehealth: Payer: Self-pay | Admitting: Pharmacy Technician

## 2023-02-23 DIAGNOSIS — Z5986 Financial insecurity: Secondary | ICD-10-CM

## 2023-02-23 NOTE — Progress Notes (Signed)
[facility])                                            [facility] Quality Pharmacy Team    02/23/2023  Lindsay [name] 20-Jun-1946 [id number]  Care coordination call placed to [facility] in regard to Eliquis application.  Spoke to [place] who informs patient is APPROVED 02/17/23-08/30/23. Initial and subsequent refills will be processed automatically each time and delivered to the patient's home address. If patient should feel current supply is not sufficient until next automated shipment, then patient can call [facility] at [phone] to discuss the refill request. [facility]'s pharmacy is [facility] and their phone number is [phone].  Pattricia Boss, CPhT [facility] Office: [phone] Fax: [phone] Email: [email or web address]

## 2023-02-24 DIAGNOSIS — I5032 Chronic diastolic (congestive) heart failure: Secondary | ICD-10-CM | POA: Diagnosis not present

## 2023-02-24 DIAGNOSIS — N1832 Chronic kidney disease, stage 3b: Secondary | ICD-10-CM | POA: Diagnosis not present

## 2023-02-24 DIAGNOSIS — I350 Nonrheumatic aortic (valve) stenosis: Secondary | ICD-10-CM | POA: Diagnosis not present

## 2023-02-24 DIAGNOSIS — I129 Hypertensive chronic kidney disease with stage 1 through stage 4 chronic kidney disease, or unspecified chronic kidney disease: Secondary | ICD-10-CM | POA: Diagnosis not present

## 2023-03-02 ENCOUNTER — Telehealth: Payer: Self-pay | Admitting: Cardiology

## 2023-03-02 MED ORDER — FUROSEMIDE 40 MG PO TABS: 40.0000 mg | ORAL_TABLET | Freq: Every day | ORAL | 3 refills | Status: DC

## 2023-03-02 NOTE — Telephone Encounter (Signed)
*  STAT* If patient is at the pharmacy, call can be transferred to refill team.   1. Which medications need to be refilled? (please list name of each medication and dose if known) furosemide (LASIX) 40 MG tablet   2. Which pharmacy/location (including street and city if local pharmacy) is medication to be sent to?  [id number] - [place], [place] - [address]    3. Do they need a 30 day or 90 day supply? 90

## 2023-03-02 NOTE — Telephone Encounter (Signed)
RX sent

## 2023-03-21 DIAGNOSIS — C44319 Basal cell carcinoma of skin of other parts of face: Secondary | ICD-10-CM | POA: Diagnosis not present

## 2023-04-01 DIAGNOSIS — C44319 Basal cell carcinoma of skin of other parts of face: Secondary | ICD-10-CM | POA: Diagnosis not present

## 2023-05-05 ENCOUNTER — Other Ambulatory Visit: Payer: Self-pay | Admitting: Cardiology

## 2023-05-13 DIAGNOSIS — E7800 Pure hypercholesterolemia, unspecified: Secondary | ICD-10-CM | POA: Diagnosis not present

## 2023-05-13 DIAGNOSIS — R7301 Impaired fasting glucose: Secondary | ICD-10-CM | POA: Diagnosis not present

## 2023-05-13 DIAGNOSIS — Z23 Encounter for immunization: Secondary | ICD-10-CM | POA: Diagnosis not present

## 2023-05-13 DIAGNOSIS — I10 Essential (primary) hypertension: Secondary | ICD-10-CM | POA: Diagnosis not present

## 2023-05-13 DIAGNOSIS — Z79899 Other long term (current) drug therapy: Secondary | ICD-10-CM | POA: Diagnosis not present

## 2023-05-18 ENCOUNTER — Other Ambulatory Visit: Payer: Self-pay | Admitting: Cardiology

## 2023-05-18 DIAGNOSIS — I4891 Unspecified atrial fibrillation: Secondary | ICD-10-CM

## 2023-05-18 MED ORDER — APIXABAN 5 MG PO TABS: 5.0000 mg | ORAL_TABLET | Freq: Two times a day (BID) | ORAL | 5 refills | Status: DC

## 2023-05-31 ENCOUNTER — Telehealth: Payer: Self-pay | Admitting: Cardiology

## 2023-05-31 ENCOUNTER — Other Ambulatory Visit: Payer: Self-pay

## 2023-05-31 DIAGNOSIS — I4891 Unspecified atrial fibrillation: Secondary | ICD-10-CM

## 2023-05-31 NOTE — Telephone Encounter (Signed)
Pt c/o medication issue:  1. Name of Medication: apixaban (ELIQUIS) 5 MG TABS tablet   2. How are you currently taking this medication (dosage and times per day)?    3. Are you having a reaction (difficulty breathing--STAT)? no  4. What is your medication issue? Patient calling to see if there is any particular reason to why one 60 days was sent in instead of 90. Please advise

## 2023-05-31 NOTE — Telephone Encounter (Signed)
Called patient and explained that she would still get her medication. Instead of every 90 days it would be every 60 days. I explained that I was not sure why it was put in as 60 tablets every 2 months with 5 re-fills unless it was a requirement of her pharmacy. Patient verbalized understanding and had no further questions at this time.

## 2023-05-31 NOTE — Telephone Encounter (Signed)
Opened in error

## 2023-06-15 ENCOUNTER — Encounter: Payer: Self-pay | Admitting: Cardiology

## 2023-06-16 ENCOUNTER — Other Ambulatory Visit: Payer: Self-pay | Admitting: *Deleted

## 2023-06-16 DIAGNOSIS — I4891 Unspecified atrial fibrillation: Secondary | ICD-10-CM

## 2023-06-16 MED ORDER — APIXABAN 5 MG PO TABS: 5.0000 mg | ORAL_TABLET | Freq: Two times a day (BID) | ORAL | 1 refills | Status: DC

## 2023-06-16 NOTE — Telephone Encounter (Signed)
Prescription refill request for Eliquis received. Indication: afib  Last office visit: Munley, 02/21/2023 Scr: 1.4 02/24/2023 Age: 77 yo  Weight: 132.9 kg   Refill sent for a 90 day supply.

## 2023-06-28 DIAGNOSIS — H353211 Exudative age-related macular degeneration, right eye, with active choroidal neovascularization: Secondary | ICD-10-CM | POA: Diagnosis not present

## 2023-06-30 DIAGNOSIS — H353222 Exudative age-related macular degeneration, left eye, with inactive choroidal neovascularization: Secondary | ICD-10-CM | POA: Diagnosis not present

## 2023-06-30 DIAGNOSIS — H43811 Vitreous degeneration, right eye: Secondary | ICD-10-CM | POA: Diagnosis not present

## 2023-06-30 DIAGNOSIS — D3131 Benign neoplasm of right choroid: Secondary | ICD-10-CM | POA: Diagnosis not present

## 2023-06-30 DIAGNOSIS — H353211 Exudative age-related macular degeneration, right eye, with active choroidal neovascularization: Secondary | ICD-10-CM | POA: Diagnosis not present

## 2023-06-30 DIAGNOSIS — H35033 Hypertensive retinopathy, bilateral: Secondary | ICD-10-CM | POA: Diagnosis not present

## 2023-08-04 DIAGNOSIS — H353222 Exudative age-related macular degeneration, left eye, with inactive choroidal neovascularization: Secondary | ICD-10-CM | POA: Diagnosis not present

## 2023-08-04 DIAGNOSIS — H35033 Hypertensive retinopathy, bilateral: Secondary | ICD-10-CM | POA: Diagnosis not present

## 2023-08-04 DIAGNOSIS — H353211 Exudative age-related macular degeneration, right eye, with active choroidal neovascularization: Secondary | ICD-10-CM | POA: Diagnosis not present

## 2023-08-04 DIAGNOSIS — D3131 Benign neoplasm of right choroid: Secondary | ICD-10-CM | POA: Diagnosis not present

## 2023-08-04 DIAGNOSIS — H43811 Vitreous degeneration, right eye: Secondary | ICD-10-CM | POA: Diagnosis not present

## 2023-09-15 DIAGNOSIS — H353211 Exudative age-related macular degeneration, right eye, with active choroidal neovascularization: Secondary | ICD-10-CM | POA: Diagnosis not present

## 2023-09-15 DIAGNOSIS — H353222 Exudative age-related macular degeneration, left eye, with inactive choroidal neovascularization: Secondary | ICD-10-CM | POA: Diagnosis not present

## 2023-09-15 DIAGNOSIS — H35033 Hypertensive retinopathy, bilateral: Secondary | ICD-10-CM | POA: Diagnosis not present

## 2023-09-15 DIAGNOSIS — H43811 Vitreous degeneration, right eye: Secondary | ICD-10-CM | POA: Diagnosis not present

## 2023-09-15 DIAGNOSIS — D3131 Benign neoplasm of right choroid: Secondary | ICD-10-CM | POA: Diagnosis not present

## 2023-09-19 ENCOUNTER — Encounter: Payer: Self-pay | Admitting: Cardiology

## 2023-09-19 DIAGNOSIS — M1990 Unspecified osteoarthritis, unspecified site: Secondary | ICD-10-CM | POA: Insufficient documentation

## 2023-09-19 NOTE — Progress Notes (Deleted)
Cardiology Office Note:    Date:  09/19/2023   ID:  Lindsay [name], DOB 01-Jul-1946, MRN [id number]  PCP:  Marylen Ponto, MD  Cardiologist:  Norman Herrlich, MD    Referring MD: Marylen Ponto, MD    ASSESSMENT:    1. S/P TAVR (transcatheter aortic valve replacement)   2. Permanent atrial fibrillation (HCC)   3. Chronic anticoagulation   4. Hypertensive heart disease with chronic diastolic congestive heart failure (HCC)   5. Stage 3b chronic kidney disease (HCC)   6. Nonrheumatic mitral valve stenosis   7. Mixed hyperlipidemia    PLAN:    In order of problems listed above:  ***   Next appointment: ***   Medication Adjustments/Labs and Tests Ordered: Current medicines are reviewed at length with the patient today.  Concerns regarding medicines are outlined above.  No orders of the defined types were placed in this encounter.  No orders of the defined types were placed in this encounter.    History of Present Illness:    Lindsay [name] is a 78 y.o. female with a hx of symptomatic severe aortic stenosis with heart failure and TAVR June 2023 chronic atrial fibrillation on long-term anticoagulant therapy obesity with a BMI exceeding 40 history of breast cancer with mastectomy and chemotherapy and stage IIIb CKD.  Last seen 02/21/2023.Last echocardiogram 02/14/2023 showed normal TAVR function ejection fraction 60 to 65% with mild LVH and reduced GLS pulmonary artery pressure was described as moderately elevated and although the mitral valve is described as normal there is mild mitral stenosis present with a mean gradient of 4 mmHg.  I independently reviewed the echocardiogram the valve is degenerative and there is mild to moderate mitral annular calcification seen. Compliance with diet, lifestyle and medications: *** Past Medical History:  Diagnosis Date   Antineoplastic chemotherapy induced anemia 11/03/2017   Arthritis    Asymptomatic bacteriuria 01/18/2022   Baker's cyst of knee,  left 02/12/2020   Breast cancer (HCC)    Chronic atrial fibrillation (HCC) 01/18/2022   Chronic diastolic (congestive) heart failure (HCC)    Chronic kidney disease (CKD), stage IV (severe) (HCC)    Chronic pain of left knee 02/12/2020   COPD (chronic obstructive pulmonary disease) (HCC) 01/18/2022   Diverticulitis 01/18/2022   Essential hypertension    History of kidney stones    History of left breast cancer 12/06/2017   Hypercalcemia 01/18/2022   Hyperlipidemia 04/11/2019   Hypoxia 01/18/2022   Malignant neoplasm of upper-inner quadrant of left breast in female, estrogen receptor negative (HCC) 06/21/2017   Neutropenic fever (HCC) 01/18/2022   PAF (paroxysmal atrial fibrillation) (HCC)    PAT (paroxysmal atrial tachycardia) (HCC) 10/30/2016   Personal history of colonic polyps    Personal history of skin cancer    Port-A-Cath in place 07/01/2017   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 Excell Seltzer & Dr. Laneta Simmers   Severe aortic stenosis 11/17/2021   SVT (supraventricular tachycardia) (HCC) 05/31/2018   Uncontrolled hypertension 01/18/2022    Current Medications: Current Meds  Medication Sig   losartan (COZAAR) 50 MG tablet Take 50 mg by mouth daily.      EKGs/Labs/Other Studies Reviewed:    The following studies were reviewed today:  Cardiac Studies & Procedures   CARDIAC CATHETERIZATION  CARDIAC CATHETERIZATION 11/23/2021  Narrative   Hemodynamic findings consistent with mild pulmonary hypertension.  1.  Known severe  aortic stenosis by noninvasive assessment 2.  Widely patent coronary arteries with minimal irregularity, no significant coronary stenoses 3.  Elevated pulmonary capillary wedge pressure consistent with congestive heart failure/high filling pressures 4.  Mild pulmonary hypertension with PA pressure 54/23 mean 34 mmHg, transpulmonary gradient 6 mmHg, PVR less than 1 Woods  unit  Recommend: Continue diuresis (lasix 40 mg IV ordered post-cath x 1) Continue TAVR evaluation Resume heparin post-procedure, transition to apixaban pending case manager consult (see notes patient unable to afford apixaban pre-hospital)  Findings Coronary Findings Diagnostic  Dominance: Right  Left Main The vessel exhibits minimal luminal irregularities.  Left Anterior Descending The vessel exhibits minimal luminal irregularities. Widely patent LAD to the apex, patent diagonals, no significant stenoses  Left Circumflex The vessel exhibits minimal luminal irregularities. The circumflex is widely patent with 2 large OM&#39;s with no significant stenoses  Right Coronary Artery The vessel exhibits minimal luminal irregularities. Widely patent, dominant RCA with no obstructive disease.  The PDA and PLA branches are patent with no obstruction.  Intervention  No interventions have been documented.    ECHOCARDIOGRAM  ECHOCARDIOGRAM COMPLETE 02/14/2023  Narrative ECHOCARDIOGRAM REPORT    Patient Name:   Lindsay [name] Date of Exam: 02/14/2023 Medical Rec #:  [id number]    Height:       66.0 in Accession #:    [id number]   Weight:       276.4 lb Date of Birth:  [birth year]     BSA:          2.295 m Patient Age:    77 years     BP:           130/80 mmHg Patient Gender: F            HR:           81 bpm. Exam Location:  [place]  Procedure: 2D Echo, Cardiac Doppler, Color Doppler and Strain Analysis  Indications:    S/P TAVR (transcatheter aortic valve replacement) [D22.0 (ICD-10-CM)]  History:        Patient has prior history of Echocardiogram examinations, most recent 03/17/2022. COPD, Aortic Valve Disease; Risk Factors:Hypertension.  Sonographer:    Louie Boston RDCS Referring Phys: [phone] JILL D MCDANIEL  IMPRESSIONS   1. Left ventricular ejection fraction, by estimation, is 60 to 65%. The left ventricle has normal function. The left ventricle has no regional wall motion  abnormalities. There is mild left ventricular hypertrophy. Left ventricular diastolic parameters are indeterminate. The average left ventricular global longitudinal strain is -11.2 %. The global longitudinal strain is abnormal. 2. Right ventricular systolic function is normal. The right ventricular size is normal. There is moderately elevated pulmonary artery systolic pressure. 3. Left atrial size was severely dilated. 4. Right atrial size was moderately dilated. 5. The mitral valve is normal in structure. Mild to moderate mitral valve regurgitation. Mild mitral stenosis. 6. The aortic valve has been repaired/replaced. Aortic valve regurgitation is not visualized. No aortic stenosis is present. Procedure Date: 02/10/2022. Echo findings are consistent with normal structure and function of the aortic valve prosthesis. Aortic valve mean gradient measures 10.0 mmHg. 7. The inferior vena cava is normal in size with greater than 50% respiratory variability, suggesting right atrial pressure of 3 mmHg.  FINDINGS Left Ventricle: Left ventricular ejection fraction, by estimation, is 60 to 65%. The left ventricle has normal function. The left ventricle has no regional wall motion abnormalities. The average left ventricular global longitudinal strain is -11.2 %. The global  longitudinal strain is abnormal. The left ventricular internal cavity size was normal in size. There is mild left ventricular hypertrophy. Left ventricular diastolic parameters are indeterminate.  Right Ventricle: The right ventricular size is normal. No increase in right ventricular wall thickness. Right ventricular systolic function is normal. There is moderately elevated pulmonary artery systolic pressure. The tricuspid regurgitant velocity is 3.40 m/s, and with an assumed right atrial pressure of 3 mmHg, the estimated right ventricular systolic pressure is 49.2 mmHg.  Left Atrium: Left atrial size was severely dilated.  Right Atrium:  Right atrial size was moderately dilated.  Pericardium: There is no evidence of pericardial effusion.  Mitral Valve: The mitral valve is normal in structure. Mild to moderate mitral valve regurgitation. Mild mitral valve stenosis. MV peak gradient, 11.3 mmHg. The mean mitral valve gradient is 4.0 mmHg.  Tricuspid Valve: The tricuspid valve is normal in structure. Tricuspid valve regurgitation is mild . No evidence of tricuspid stenosis.  Aortic Valve: The aortic valve has been repaired/replaced. Aortic valve regurgitation is not visualized. No aortic stenosis is present. Aortic valve mean gradient measures 10.0 mmHg. Aortic valve peak gradient measures 19.0 mmHg. Aortic valve area, by VTI measures 1.53 cm. There is a 26 mm Sapien prosthetic, stented (TAVR) valve present in the aortic position. Echo findings are consistent with normal structure and function of the aortic valve prosthesis.  Pulmonic Valve: The pulmonic valve was normal in structure. Pulmonic valve regurgitation is mild. No evidence of pulmonic stenosis.  Aorta: The aortic root is normal in size and structure.  Venous: The inferior vena cava is normal in size with greater than 50% respiratory variability, suggesting right atrial pressure of 3 mmHg.  IAS/Shunts: No atrial level shunt detected by color flow Doppler.   LEFT VENTRICLE PLAX 2D LVIDd:         4.10 cm     Diastology LVIDs:         3.60 cm     LV e' medial:    6.20 cm/s LV PW:         1.40 cm     LV E/e' medial:  24.8 LV IVS:        1.40 cm     LV e' lateral:   10.70 cm/s LVOT diam:     2.10 cm     LV E/e' lateral: 14.4 LV SV:         64 LV SV Index:   28          2D Longitudinal Strain LVOT Area:     3.46 cm    2D Strain GLS Avg:     -11.2 %  LV Volumes (MOD) LV vol d, MOD A2C: 63.5 ml LV vol d, MOD A4C: 78.0 ml LV vol s, MOD A2C: 32.2 ml LV vol s, MOD A4C: 38.5 ml LV SV MOD A2C:     31.3 ml LV SV MOD A4C:     78.0 ml LV SV MOD BP:      33.3 ml  RIGHT  VENTRICLE             IVC RV Basal diam:  3.90 cm     IVC diam: 2.00 cm RV S prime:     12.10 cm/s TAPSE (M-mode): 1.7 cm  LEFT ATRIUM              Index        RIGHT ATRIUM           Index LA diam:  4.60 cm  2.00 cm/m   RA Area:     26.50 cm LA Vol (A2C):   124.0 ml 54.04 ml/m  RA Volume:   94.10 ml  41.01 ml/m LA Vol (A4C):   103.0 ml 44.89 ml/m LA Biplane Vol: 115.0 ml 50.12 ml/m AORTIC VALVE                     PULMONIC VALVE AV Area (Vmax):    1.42 cm      PR End Diast Vel: 8.76 msec AV Area (Vmean):   1.40 cm AV Area (VTI):     1.53 cm AV Vmax:           217.67 cm/s AV Vmean:          147.000 cm/s AV VTI:            0.418 m AV Peak Grad:      19.0 mmHg AV Mean Grad:      10.0 mmHg LVOT Vmax:         89.20 cm/s LVOT Vmean:        59.433 cm/s LVOT VTI:          0.185 m LVOT/AV VTI ratio: 0.44  AORTA Ao Root diam: 2.50 cm Ao Asc diam:  3.40 cm Ao Desc diam: 2.30 cm  MITRAL VALVE                  TRICUSPID VALVE MV Area VTI:  1.89 cm        TR Peak grad:   46.2 mmHg MV Peak grad: 11.3 mmHg       TR Vmax:        340.00 cm/s MV Mean grad: 4.0 mmHg MV Vmax:      1.68 m/s        SHUNTS MV Vmean:     94.3 cm/s       Systemic VTI:  0.18 m MR Peak grad:    140.7 mmHg   Systemic Diam: 2.10 cm MR Mean grad:    96.0 mmHg MR Vmax:         593.00 cm/s MR Vmean:        456.0 cm/s MR PISA:         2.26 cm MR PISA Eff ROA: 14 mm MR PISA Radius:  0.60 cm MV E velocity: 154.00 cm/s MV A velocity: 70.00 cm/s MV E/A ratio:  2.20  Gypsy Balsam MD Electronically signed by Gypsy Balsam MD Signature Date/Time: 02/14/2023/5:19:47 PM    Final   MONITORS  LONG TERM MONITOR (3-14 DAYS) 05/22/2021  Narrative Patch Wear Time:  6 days and 18 hours (2022-09-09T14:46:12-398 to 2022-09-16T09:03:19-0400)  Atrial Fibrillation occurred continuously (100% burden), ranging from 47-116 bpm (avg of 74 bpm). Isolated VEs were occasional (2.0%, 13235), VE Couplets were  rare (<1.0%, 413), and VE Triplets were rare (<1.0%, 17). Ventricular Trigeminy was present.  There were 6 triggered events all atrial fibrillation with a controlled ventricular rate and to had a single PVC present. Atrial fibrillation is present throughout with good heart rate control in range 50 to 110 bpm average heart rate 74 bpm. Ventricular ectopy was occasional with 413 couplets 17 triplets.  Conclusion atrial fibrillation with good heart rate control, occasional ventricular ectopy.  CT SCANS  CT CORONARY MORPH W/CTA COR W/SCORE 12/25/2021  Addendum 12/28/2021 11:45 AM ADDENDUM REPORT: 12/28/2021 11:42  CLINICAL DATA:  Aortic valve replacement (TAVR), pre-op eval  EXAM: Cardiac TAVR CT  TECHNIQUE: The  patient was scanned on a [employer] 192 slice scanner. A 120 kV retrospective scan was triggered in the descending thoracic aorta at 111 HU's. Gantry rotation speed was 270 msecs and collimation was .9 mm. The 3D data set was reconstructed in 5% intervals of the R-R cycle. Systolic and diastolic phases were analyzed on a dedicated work station using MPR, MIP and VRT modes. The patient received OMNIPAQUE IOHEXOL 350 MG/ML SOLN of contrast.  FINDINGS: Image quality: Average  Noise  Aortic Valve: Tricuspid aortic valve. Severely reduced cusp separation. Severely thickened, severely calcified aortic valve cusps.  AV calcium score: 1549  Virtual Basal Annulus Measurements:  Maximum/Minimum Diameter: 28.4 x 22.7 mm  Perimeter: 80.9 mm  Area:  496 mm2  No significant LVOT calcifications.  Based on these measurements, the annulus would be suitable for a 26 mm valve.  Sinus of Valsalva Measurements:  Non-coronary:  33 mm  Right - coronary:  32 mm  Left - coronary:  33 mm  Sinus of Valsalva Height:  Left: 23.8 mm  Right: 22.7 mm  Aorta: 4 vessel branch pattern of aortic arch with the right subclavian artery and right common carotid artery  originating directly off aortic arch. Severe aortic atherosclerosis.  Sinotubular Junction:  30 mm  Ascending Thoracic Aorta:  35 mm  Aortic Arch:  28 mm  Descending Thoracic Aorta:  29 mm  Coronary Artery Height above Annulus:  Left main: 16.7 mm  Right coronary: 17.4 mm  Coronary Arteries: Normal coronary origin. Right dominance. The study was performed without use of NTG and insufficient for plaque evaluation.  Optimum Fluoroscopic Angle for Delivery: LAO 3, CAU 4  Moderate mitral annular calcifications.  No left atrial appendage thrombus.  IMPRESSION: 1. Tricuspid aortic valve with severely reduced cusp excursion. Severely thickened and severely calcified aortic valve cusps.  2.  Aortic valve calcium score: 1549  3. Annulus area: 496 mm2, suitable for 26 mm Sapien 3 valve. No LVOT calcifications.  4.  Sufficient coronary artery heights from annulus.  5. Optimum fluoroscopic angle for delivery: LAO 3, CAU 4   Electronically Signed By: Weston Brass M.D. On: 12/28/2021 11:42  Narrative EXAM: OVER-READ INTERPRETATION  CT CHEST  The following report is an over-read performed by radiologist Dr. Allegra Lai of [facility] on 12/25/2021. This over-read does not include interpretation of cardiac or coronary anatomy or pathology. The coronary calcium score/coronary CTA interpretation by the cardiologist is attached.  COMPARISON:  None.  FINDINGS: Extracardiac findings will be described separately under dictation for contemporaneously obtained CTA chest, abdomen and pelvis.  IMPRESSION: Please see separate dictation for contemporaneously obtained CTA chest, abdomen and pelvis dated 12/25/2021 for full description of relevant extracardiac findings.  Electronically Signed: By: Allegra Lai M.D. On: 12/25/2021 12:03   CT SCANS  CT CARDIAC SCORING (SELF PAY ONLY) 06/16/2021  Addendum 06/16/2021 10:01 PM ADDENDUM REPORT:  06/16/2021 21:59  CLINICAL DATA:  Cardiovascular disease risk stratification  EXAM: CT Coronary Calcium Score  TECHNIQUE: A gated, non-contrast computed tomography scan of the heart was performed using 3mm slice thickness. Axial images were analyzed on a dedicated workstation. Calcium scoring of the coronary arteries was performed using the Agatston method.  FINDINGS: Aortic valve calcium score: 1169  Coronary arteries: Normal origins.  Coronary Calcium Score:  Left main: 114  Left anterior descending artery: 113  Left circumflex artery: 0  Right coronary artery: 3.3  Total: 230  Percentile: 70th  Pericardium: Normal.  Ascending Aorta: Grossly  normal caliber. Ascending aorta measures approximately 37mm at the mid ascending aorta measured in an axial plane.  Severe mitral annular calcification.  Non-cardiac: See separate report from [facility].  IMPRESSION: Aortic valve calcium score: 1169  Coronary calcium score of 230. This was 70th percentile for age-, race-, and sex-matched controls.  RECOMMENDATIONS: Coronary artery calcium (CAC) score is a strong predictor of incident coronary heart disease (CHD) and provides predictive information beyond traditional risk factors. CAC scoring is reasonable to use in the decision to withhold, postpone, or initiate statin therapy in intermediate-risk or selected borderline-risk asymptomatic adults (age 70-75 years and LDL-C >=70 to <190 mg/dL) who do not have diabetes or established atherosclerotic cardiovascular disease (ASCVD).* In intermediate-risk (10-year ASCVD risk >=7.5% to <20%) adults or selected borderline-risk (10-year ASCVD risk >=5% to <7.5%) adults in whom a CAC score is measured for the purpose of making a treatment decision the following recommendations have been made:  If CAC=0, it is reasonable to withhold statin therapy and reassess in 5 to 10 years, as long as higher risk conditions are  absent (diabetes mellitus, family history of premature CHD in first degree relatives (males <55 years; females <65 years), cigarette smoking, or LDL >=190 mg/dL).  If CAC is 1 to 99, it is reasonable to initiate statin therapy for patients >=44 years of age.  If CAC is >=100 or >=75th percentile, it is reasonable to initiate statin therapy at any age.  Cardiology referral should be considered for patients with CAC scores >=400 or >=75th percentile.  *2018 AHA/ACC/AACVPR/AAPA/ABC/ACPM/ADA/AGS/APhA/ASPC/NLA/PCNA Guideline on the Management of Blood Cholesterol: A Report of the American College of Cardiology/American Heart Association Task Force on Clinical Practice Guidelines. J Am Coll Cardiol. 2019;73(24):3168-3209.   Electronically Signed By: Weston Brass M.D. On: 06/16/2021 21:59  Narrative EXAM: OVER-READ INTERPRETATION  CT CHEST  The following report is an over-read performed by radiologist Dr. Irish Lack of [facility] on 06/16/2021. This over-read does not include interpretation of cardiac or coronary anatomy or pathology. The coronary calcium score interpretation by the cardiologist is attached.  COMPARISON:  None.  FINDINGS: Vascular: Thoracic aortic atherosclerosis.  Mediastinum/Nodes: Visualized mediastinum and hilar regions demonstrate no lymphadenopathy or masses. There are some small nonenlarged mediastinal lymph nodes.  Lungs/Pleura: Visualized lungs show no evidence of pulmonary edema, consolidation, pneumothorax, nodule or pleural fluid.  Upper Abdomen: No acute abnormality.  Musculoskeletal: Status post left mastectomy. No bony abnormalities are identified in the visualized chest.  IMPRESSION: Atherosclerosis of the thoracic aorta. No significant incidental findings.  Electronically Signed: By: Irish Lack M.D. On: 06/16/2021 14:50              Recent Labs: No results found for requested labs within last 365  days.  Recent Lipid Panel No results found for: "CHOL", "TRIG", "HDL", "CHOLHDL", "VLDL", "LDLCALC", "LDLDIRECT"  Physical Exam:    VS:  There were no vitals taken for this visit.    Wt Readings from Last 3 Encounters:  02/21/23 293 lb (132.9 kg)  07/01/22 276 lb 6.4 oz (125.4 kg)  03/17/22 272 lb (123.4 kg)     GEN: *** Well nourished, well developed in no acute distress HEENT: Normal NECK: No JVD; No carotid bruits LYMPHATICS: No lymphadenopathy CARDIAC: ***RRR, no murmurs, rubs, gallops RESPIRATORY:  Clear to auscultation without rales, wheezing or rhonchi  ABDOMEN: Soft, non-tender, non-distended MUSCULOSKELETAL:  No edema; No deformity  SKIN: Warm and dry NEUROLOGIC:  Alert and oriented x 3 PSYCHIATRIC:  Normal affect  Signed, Norman Herrlich, MD  09/19/2023 8:01 PM    [facility]

## 2023-09-20 ENCOUNTER — Ambulatory Visit: Payer: Medicare Other | Admitting: Cardiology

## 2023-09-20 DIAGNOSIS — E782 Mixed hyperlipidemia: Secondary | ICD-10-CM

## 2023-09-20 DIAGNOSIS — I342 Nonrheumatic mitral (valve) stenosis: Secondary | ICD-10-CM

## 2023-09-20 DIAGNOSIS — I110 Hypertensive heart disease with heart failure: Secondary | ICD-10-CM

## 2023-09-20 DIAGNOSIS — I4821 Permanent atrial fibrillation: Secondary | ICD-10-CM

## 2023-09-20 DIAGNOSIS — Z7901 Long term (current) use of anticoagulants: Secondary | ICD-10-CM

## 2023-09-20 DIAGNOSIS — Z952 Presence of prosthetic heart valve: Secondary | ICD-10-CM

## 2023-09-20 DIAGNOSIS — N1832 Chronic kidney disease, stage 3b: Secondary | ICD-10-CM

## 2023-09-26 ENCOUNTER — Telehealth: Payer: Self-pay

## 2023-09-26 NOTE — Progress Notes (Signed)
   09/26/2023  Patient ID: Lindsay [name], female   DOB: 12-17-1945, 78 y.o.   MRN: [id number]    09/26/2023  Lindsay [name] 16-Sep-1945 [id number]   2025 Medication Assistance Renewal Application Summary:  Patient was outreached regarding medication assistance renewal for 2025. Verified address, anticipated insurance for 2025, and income has not changed. Patient remains interested in PAP for 2025 for Eliquis, no other new medications were identified for medication assistance. The patient has not met her 3% OOP yet, she will call me back after her first fill at her local pharmacy.    Medication Assistance Findings:  Medication assistance needs identified: Eliquis     Additional medication assistance options reviewed with patient as warranted:  No other options identified  Plan: I will wait for the patient to fill Eliquis at her local pharmacy to meet her OOP expense, then once that is verified and we are ready to move forward with PAP re-enrollment, I will route patient assistance letter to pharmacy technician who will coordinate patient assistance program application process for medications listed above.  Pharmacy technician will assist with obtaining all required documents from both patient and provider(s) and submit application(s) once completed.    Thank you for allowing pharmacy to be a part of this patient's care.  Harlon Flor, PharmD Clinical Pharmacist  [phone]

## 2023-09-28 NOTE — Progress Notes (Unsigned)
Cardiology Office Note:    Date:  09/29/2023   ID:  Lindsay [name], DOB 09/24/1945, MRN [id number]  PCP:  Marylen Ponto, MD  Cardiologist:  Norman Herrlich, MD    Referring MD: Marylen Ponto, MD    ASSESSMENT:    1. S/P TAVR (transcatheter aortic valve replacement)   2. Permanent atrial fibrillation (HCC)   3. Chronic anticoagulation   4. Hypertensive heart disease with chronic diastolic congestive heart failure (HCC)   5. Stage 3b chronic kidney disease (HCC)   6. Nonrheumatic mitral valve stenosis   7. Mixed hyperlipidemia   8. Morbid obesity (HCC)     PLAN:    In order of problems listed above:  [name] continues to do well following TAVR she was quite ill before the procedure with severe heart failure there is difficult to manage and after valve intervention her heart failure is well compensated and she is asymptomatic. Surveillance echo June 2025 Rate seems excessively slow decrease the frequency and dose of her beta-blocker to metoprolol 25 twice daily and continue her current anticoagulant Hypertension is well-controlled continue her ARB and hydralazine and current loop diuretic Stable CKD Stable mitral valve disease with echocardiogram June to assess progression Continue her statin she will have upcoming labs with her PCP I think with her morbid obesity and comorbidities of valvular heart disease atrial fibrillation heart failure she would benefit from semaglutide therapy and will discuss it with her family physician   Next appointment: 6 months   Medication Adjustments/Labs and Tests Ordered: Current medicines are reviewed at length with the patient today.  Concerns regarding medicines are outlined above.  No orders of the defined types were placed in this encounter.  No orders of the defined types were placed in this encounter.    History of Present Illness:    Lindsay [name] is a 78 y.o. female with a hx of symptomatic severe aortic stenosis with heart failure and  TAVR June 2023 chronic atrial fibrillation on long-term anticoagulant therapy obesity with a BMI exceeding 40 history of breast cancer with mastectomy and chemotherapy and stage IIIb CKD.  Last seen 02/21/2023.Last echocardiogram 02/14/2023 showed normal TAVR function ejection fraction 60 to 65% with mild LVH and reduced GLS pulmonary artery pressure was described as moderately elevated and although the mitral valve is described as normal there is mild mitral stenosis present with a mean gradient of 4 mmHg.  I independently reviewed the echocardiogram the valve is degenerative and there is mild to moderate mitral annular calcification seen.  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  I think semaglutide would be quite beneficial with her cardiac morbidities including atrial fibrillation heart failure and valvular heart disease. She is not having edema orthopnea chest pain palpitation or syncope She has had no bleeding complication from her anticoagulant She continues on lipid-lowering therapy with a high intensity statin, last lipid profile February 2024  cholesterol 166 LDL 54 non-HDL cholesterol 112 triglycerides 121, in June hemoglobin 14.4 creatinine 1.40 magnesium 2.2 Past Medical History:  Diagnosis Date   Antineoplastic chemotherapy induced anemia 11/03/2017   Arthritis    Asymptomatic bacteriuria 01/18/2022   Baker's cyst  of knee, left 02/12/2020   Breast cancer (HCC)    Chronic atrial fibrillation (HCC) 01/18/2022   Chronic diastolic (congestive) heart failure (HCC)    Chronic kidney disease (CKD), stage IV (severe) (HCC)    Chronic pain of left knee 02/12/2020   COPD (chronic obstructive pulmonary disease) (HCC) 01/18/2022   Diverticulitis 01/18/2022   Essential hypertension    History of kidney stones    History of left breast cancer 12/06/2017   Hypercalcemia 01/18/2022   Hyperlipidemia 04/11/2019   Hypoxia 01/18/2022   Malignant neoplasm of upper-inner quadrant of left breast in female, estrogen receptor negative (HCC) 06/21/2017   Neutropenic fever (HCC) 01/18/2022   PAF (paroxysmal atrial fibrillation) (HCC)    PAT (paroxysmal atrial tachycardia) (HCC) 10/30/2016   Personal history of colonic polyps    Personal history of skin cancer    Port-A-Cath in place 07/01/2017   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 Excell Seltzer & Dr. Laneta Simmers   Severe aortic stenosis 11/17/2021   SVT (supraventricular tachycardia) (HCC) 05/31/2018   Uncontrolled hypertension 01/18/2022    Current Medications: Current Meds  Medication Sig   acetaminophen (TYLENOL) 500 MG tablet Take 1,000 mg by mouth every 6 (six) hours as needed for moderate pain or headache.   albuterol (VENTOLIN HFA) 108 (90 Base) MCG/ACT inhaler Inhale 2 puffs into the lungs every 4 (four) hours as needed for shortness of breath or wheezing.   apixaban (ELIQUIS) 5 MG TABS tablet Take 1 tablet (5 mg total) by mouth 2 (two) times daily.   atorvastatin (LIPITOR) 10 MG tablet Take 10 mg by mouth at bedtime.   BREO ELLIPTA 100-25 MCG/ACT AEPB Inhale 1 puff into the lungs daily.   furosemide (LASIX) 40 MG tablet Take 1 tablet (40 mg total) by mouth daily.   hydrALAZINE (APRESOLINE) 25 MG tablet Take 37.5 mg by mouth 3 (three) times daily.   losartan (COZAAR) 50 MG tablet  Take 50 mg by mouth daily.   Magnesium 250 MG TABS Take 250 mg by mouth daily.   metoprolol tartrate (LOPRESSOR) 25 MG tablet Take 1 tablet (25 mg total) by mouth 2 (two) times daily.   montelukast (SINGULAIR) 10 MG tablet Take 10 mg by mouth daily.   Omega-3 1000 MG CAPS Take 1,000 mg by mouth 2 (two) times daily.   potassium chloride (KLOR-CON) 10 MEQ tablet Take 1 tablet (10 mEq total) by mouth daily.      EKGs/Labs/Other Studies Reviewed:    The following studies were reviewed today:  Cardiac Studies & Procedures   CARDIAC CATHETERIZATION  CARDIAC CATHETERIZATION 11/23/2021  Narrative   Hemodynamic findings consistent with mild pulmonary hypertension.  1.  Known severe aortic stenosis by noninvasive assessment 2.  Widely patent coronary arteries with minimal irregularity, no significant coronary stenoses 3.  Elevated pulmonary capillary wedge pressure consistent with congestive heart failure/high filling pressures 4.  Mild pulmonary hypertension with PA pressure 54/23 mean 34 mmHg, transpulmonary gradient 6 mmHg, PVR less than 1 Woods unit  Recommend: Continue diuresis (lasix 40 mg IV ordered post-cath x 1) Continue TAVR evaluation Resume heparin post-procedure, transition to apixaban pending case manager consult (see  notes patient unable to afford apixaban pre-hospital)  Findings Coronary Findings Diagnostic  Dominance: Right  Left Main The vessel exhibits minimal luminal irregularities.  Left Anterior Descending The vessel exhibits minimal luminal irregularities. Widely patent LAD to the apex, patent diagonals, no significant stenoses  Left Circumflex The vessel exhibits minimal luminal irregularities. The circumflex is widely patent with 2 large OM&#39;s with no significant stenoses  Right Coronary Artery The vessel exhibits minimal luminal irregularities. Widely patent, dominant RCA with no obstructive disease.  The PDA and PLA branches are patent with no  obstruction.  Intervention  No interventions have been documented.    ECHOCARDIOGRAM  ECHOCARDIOGRAM COMPLETE 02/14/2023  Narrative ECHOCARDIOGRAM REPORT    Patient Name:   JAVAYAH MAGAW Roller Date of Exam: 02/14/2023 Medical Rec #:  [id number]    Height:       66.0 in Accession #:    [id number]   Weight:       276.4 lb Date of Birth:  1946-03-02     BSA:          2.295 m Patient Age:    77 years     BP:           130/80 mmHg Patient Gender: F            HR:           81 bpm. Exam Location:  [place]  Procedure: 2D Echo, Cardiac Doppler, Color Doppler and Strain Analysis  Indications:    S/P TAVR (transcatheter aortic valve replacement) [W29.5 (ICD-10-CM)]  History:        Patient has prior history of Echocardiogram examinations, most recent 03/17/2022. COPD, Aortic Valve Disease; Risk Factors:Hypertension.  Sonographer:    Louie Boston RDCS Referring Phys: [id number] JILL D MCDANIEL  IMPRESSIONS   1. Left ventricular ejection fraction, by estimation, is 60 to 65%. The left ventricle has normal function. The left ventricle has no regional wall motion abnormalities. There is mild left ventricular hypertrophy. Left ventricular diastolic parameters are indeterminate. The average left ventricular global longitudinal strain is -11.2 %. The global longitudinal strain is abnormal. 2. Right ventricular systolic function is normal. The right ventricular size is normal. There is moderately elevated pulmonary artery systolic pressure. 3. Left atrial size was severely dilated. 4. Right atrial size was moderately dilated. 5. The mitral valve is normal in structure. Mild to moderate mitral valve regurgitation. Mild mitral stenosis. 6. The aortic valve has been repaired/replaced. Aortic valve regurgitation is not visualized. No aortic stenosis is present. Procedure Date: 02/10/2022. Echo findings are consistent with normal structure and function of the aortic valve prosthesis. Aortic valve mean gradient  measures 10.0 mmHg. 7. The inferior vena cava is normal in size with greater than 50% respiratory variability, suggesting right atrial pressure of 3 mmHg.  FINDINGS Left Ventricle: Left ventricular ejection fraction, by estimation, is 60 to 65%. The left ventricle has normal function. The left ventricle has no regional wall motion abnormalities. The average left ventricular global longitudinal strain is -11.2 %. The global longitudinal strain is abnormal. The left ventricular internal cavity size was normal in size. There is mild left ventricular hypertrophy. Left ventricular diastolic parameters are indeterminate.  Right Ventricle: The right ventricular size is normal. No increase in right ventricular wall thickness. Right ventricular systolic function is normal. There is moderately elevated pulmonary artery systolic pressure. The tricuspid regurgitant velocity is 3.40 m/s, and with an assumed right atrial pressure of 3 mmHg, the estimated right ventricular systolic pressure is  49.2 mmHg.  Left Atrium: Left atrial size was severely dilated.  Right Atrium: Right atrial size was moderately dilated.  Pericardium: There is no evidence of pericardial effusion.  Mitral Valve: The mitral valve is normal in structure. Mild to moderate mitral valve regurgitation. Mild mitral valve stenosis. MV peak gradient, 11.3 mmHg. The mean mitral valve gradient is 4.0 mmHg.  Tricuspid Valve: The tricuspid valve is normal in structure. Tricuspid valve regurgitation is mild . No evidence of tricuspid stenosis.  Aortic Valve: The aortic valve has been repaired/replaced. Aortic valve regurgitation is not visualized. No aortic stenosis is present. Aortic valve mean gradient measures 10.0 mmHg. Aortic valve peak gradient measures 19.0 mmHg. Aortic valve area, by VTI measures 1.53 cm. There is a 26 mm Sapien prosthetic, stented (TAVR) valve present in the aortic position. Echo findings are consistent with normal  structure and function of the aortic valve prosthesis.  Pulmonic Valve: The pulmonic valve was normal in structure. Pulmonic valve regurgitation is mild. No evidence of pulmonic stenosis.  Aorta: The aortic root is normal in size and structure.  Venous: The inferior vena cava is normal in size with greater than 50% respiratory variability, suggesting right atrial pressure of 3 mmHg.  IAS/Shunts: No atrial level shunt detected by color flow Doppler.   LEFT VENTRICLE PLAX 2D LVIDd:         4.10 cm     Diastology LVIDs:         3.60 cm     LV e' medial:    6.20 cm/s LV PW:         1.40 cm     LV E/e' medial:  24.8 LV IVS:        1.40 cm     LV e' lateral:   10.70 cm/s LVOT diam:     2.10 cm     LV E/e' lateral: 14.4 LV SV:         64 LV SV Index:   28          2D Longitudinal Strain LVOT Area:     3.46 cm    2D Strain GLS Avg:     -11.2 %  LV Volumes (MOD) LV vol d, MOD A2C: 63.5 ml LV vol d, MOD A4C: 78.0 ml LV vol s, MOD A2C: 32.2 ml LV vol s, MOD A4C: 38.5 ml LV SV MOD A2C:     31.3 ml LV SV MOD A4C:     78.0 ml LV SV MOD BP:      33.3 ml  RIGHT VENTRICLE             IVC RV Basal diam:  3.90 cm     IVC diam: 2.00 cm RV S prime:     12.10 cm/s TAPSE (M-mode): 1.7 cm  LEFT ATRIUM              Index        RIGHT ATRIUM           Index LA diam:        4.60 cm  2.00 cm/m   RA Area:     26.50 cm LA Vol (A2C):   124.0 ml 54.04 ml/m  RA Volume:   94.10 ml  41.01 ml/m LA Vol (A4C):   103.0 ml 44.89 ml/m LA Biplane Vol: 115.0 ml 50.12 ml/m AORTIC VALVE                     PULMONIC VALVE  AV Area (Vmax):    1.42 cm      PR End Diast Vel: 8.76 msec AV Area (Vmean):   1.40 cm AV Area (VTI):     1.53 cm AV Vmax:           217.67 cm/s AV Vmean:          147.000 cm/s AV VTI:            0.418 m AV Peak Grad:      19.0 mmHg AV Mean Grad:      10.0 mmHg LVOT Vmax:         89.20 cm/s LVOT Vmean:        59.433 cm/s LVOT VTI:          0.185 m LVOT/AV VTI ratio:  0.44  AORTA Ao Root diam: 2.50 cm Ao Asc diam:  3.40 cm Ao Desc diam: 2.30 cm  MITRAL VALVE                  TRICUSPID VALVE MV Area VTI:  1.89 cm        TR Peak grad:   46.2 mmHg MV Peak grad: 11.3 mmHg       TR Vmax:        340.00 cm/s MV Mean grad: 4.0 mmHg MV Vmax:      1.68 m/s        SHUNTS MV Vmean:     94.3 cm/s       Systemic VTI:  0.18 m MR Peak grad:    140.7 mmHg   Systemic Diam: 2.10 cm MR Mean grad:    96.0 mmHg MR Vmax:         593.00 cm/s MR Vmean:        456.0 cm/s MR PISA:         2.26 cm MR PISA Eff ROA: 14 mm MR PISA Radius:  0.60 cm MV E velocity: 154.00 cm/s MV A velocity: 70.00 cm/s MV E/A ratio:  2.20  Gypsy Balsam MD Electronically signed by Gypsy Balsam MD Signature Date/Time: 02/14/2023/5:19:47 PM    Final   MONITORS  LONG TERM MONITOR (3-14 DAYS) 05/22/2021  Narrative Patch Wear Time:  6 days and 18 hours (2022-09-09T14:46:12-398 to 2022-09-16T09:03:19-0400)  Atrial Fibrillation occurred continuously (100% burden), ranging from 47-116 bpm (avg of 74 bpm). Isolated VEs were occasional (2.0%, 13235), VE Couplets were rare (<1.0%, 413), and VE Triplets were rare (<1.0%, 17). Ventricular Trigeminy was present.  There were 6 triggered events all atrial fibrillation with a controlled ventricular rate and to had a single PVC present. Atrial fibrillation is present throughout with good heart rate control in range 50 to 110 bpm average heart rate 74 bpm. Ventricular ectopy was occasional with 413 couplets 17 triplets.  Conclusion atrial fibrillation with good heart rate control, occasional ventricular ectopy.  CT SCANS  CT CORONARY MORPH W/CTA COR W/SCORE 12/25/2021  Addendum 12/28/2021 11:45 AM ADDENDUM REPORT: 12/28/2021 11:42  CLINICAL DATA:  Aortic valve replacement (TAVR), pre-op eval  EXAM: Cardiac TAVR CT  TECHNIQUE: The patient was scanned on a Siemens Force 192 slice scanner. A 120 kV retrospective scan was triggered in the  descending thoracic aorta at 111 HU's. Gantry rotation speed was 270 msecs and collimation was .9 mm. The 3D data set was reconstructed in 5% intervals of the R-R cycle. Systolic and diastolic phases were analyzed on a dedicated work station using MPR, MIP and VRT modes. The patient received OMNIPAQUE IOHEXOL 350  MG/ML SOLN of contrast.  FINDINGS: Image quality: Average  Noise  Aortic Valve: Tricuspid aortic valve. Severely reduced cusp separation. Severely thickened, severely calcified aortic valve cusps.  AV calcium score: 1549  Virtual Basal Annulus Measurements:  Maximum/Minimum Diameter: 28.4 x 22.7 mm  Perimeter: 80.9 mm  Area:  496 mm2  No significant LVOT calcifications.  Based on these measurements, the annulus would be suitable for a 26 mm valve.  Sinus of Valsalva Measurements:  Non-coronary:  33 mm  Right - coronary:  32 mm  Left - coronary:  33 mm  Sinus of Valsalva Height:  Left: 23.8 mm  Right: 22.7 mm  Aorta: 4 vessel branch pattern of aortic arch with the right subclavian artery and right common carotid artery originating directly off aortic arch. Severe aortic atherosclerosis.  Sinotubular Junction:  30 mm  Ascending Thoracic Aorta:  35 mm  Aortic Arch:  28 mm  Descending Thoracic Aorta:  29 mm  Coronary Artery Height above Annulus:  Left main: 16.7 mm  Right coronary: 17.4 mm  Coronary Arteries: Normal coronary origin. Right dominance. The study was performed without use of NTG and insufficient for plaque evaluation.  Optimum Fluoroscopic Angle for Delivery: LAO 3, CAU 4  Moderate mitral annular calcifications.  No left atrial appendage thrombus.  IMPRESSION: 1. Tricuspid aortic valve with severely reduced cusp excursion. Severely thickened and severely calcified aortic valve cusps.  2.  Aortic valve calcium score: 1549  3. Annulus area: 496 mm2, suitable for 26 mm Sapien 3 valve. No LVOT calcifications.  4.   Sufficient coronary artery heights from annulus.  5. Optimum fluoroscopic angle for delivery: LAO 3, CAU 4   Electronically Signed By: Weston Brass M.D. On: 12/28/2021 11:42  Narrative EXAM: OVER-READ INTERPRETATION  CT CHEST  The following report is an over-read performed by radiologist Dr. Allegra Lai of [facility] on 12/25/2021. This over-read does not include interpretation of cardiac or coronary anatomy or pathology. The coronary calcium score/coronary CTA interpretation by the cardiologist is attached.  COMPARISON:  None.  FINDINGS: Extracardiac findings will be described separately under dictation for contemporaneously obtained CTA chest, abdomen and pelvis.  IMPRESSION: Please see separate dictation for contemporaneously obtained CTA chest, abdomen and pelvis dated 12/25/2021 for full description of relevant extracardiac findings.  Electronically Signed: By: Allegra Lai M.D. On: 12/25/2021 12:03   CT SCANS  CT CARDIAC SCORING (SELF PAY ONLY) 06/16/2021  Addendum 06/16/2021 10:01 PM ADDENDUM REPORT: 06/16/2021 21:59  CLINICAL DATA:  Cardiovascular disease risk stratification  EXAM: CT Coronary Calcium Score  TECHNIQUE: A gated, non-contrast computed tomography scan of the heart was performed using 3mm slice thickness. Axial images were analyzed on a dedicated workstation. Calcium scoring of the coronary arteries was performed using the Agatston method.  FINDINGS: Aortic valve calcium score: 1169  Coronary arteries: Normal origins.  Coronary Calcium Score:  Left main: 114  Left anterior descending artery: 113  Left circumflex artery: 0  Right coronary artery: 3.3  Total: 230  Percentile: 70th  Pericardium: Normal.  Ascending Aorta: Grossly normal caliber. Ascending aorta measures approximately 37mm at the mid ascending aorta measured in an axial plane.  Severe mitral annular calcification.  Non-cardiac:  See separate report from [facility].  IMPRESSION: Aortic valve calcium score: 1169  Coronary calcium score of 230. This was 70th percentile for age-, race-, and sex-matched controls.  RECOMMENDATIONS: Coronary artery calcium (CAC) score is a strong predictor of incident coronary heart disease (CHD) and provides predictive  information beyond traditional risk factors. CAC scoring is reasonable to use in the decision to withhold, postpone, or initiate statin therapy in intermediate-risk or selected borderline-risk asymptomatic adults (age 41-75 years and LDL-C >=70 to <190 mg/dL) who do not have diabetes or established atherosclerotic cardiovascular disease (ASCVD).* In intermediate-risk (10-year ASCVD risk >=7.5% to <20%) adults or selected borderline-risk (10-year ASCVD risk >=5% to <7.5%) adults in whom a CAC score is measured for the purpose of making a treatment decision the following recommendations have been made:  If CAC=0, it is reasonable to withhold statin therapy and reassess in 5 to 10 years, as long as higher risk conditions are absent (diabetes mellitus, family history of premature CHD in first degree relatives (males <55 years; females <65 years), cigarette smoking, or LDL >=190 mg/dL).  If CAC is 1 to 99, it is reasonable to initiate statin therapy for patients >=46 years of age.  If CAC is >=100 or >=75th percentile, it is reasonable to initiate statin therapy at any age.  Cardiology referral should be considered for patients with CAC scores >=400 or >=75th percentile.  *2018 AHA/ACC/AACVPR/AAPA/ABC/ACPM/ADA/AGS/APhA/ASPC/NLA/PCNA Guideline on the Management of Blood Cholesterol: A Report of the American College of Cardiology/American Heart Association Task Force on Clinical Practice Guidelines. J Am Coll Cardiol. 2019;73(24):3168-3209.   Electronically Signed By: Weston Brass M.D. On: 06/16/2021 21:59  Narrative EXAM: OVER-READ  INTERPRETATION  CT CHEST  The following report is an over-read performed by radiologist Dr. Irish Lack of [facility] on 06/16/2021. This over-read does not include interpretation of cardiac or coronary anatomy or pathology. The coronary calcium score interpretation by the cardiologist is attached.  COMPARISON:  None.  FINDINGS: Vascular: Thoracic aortic atherosclerosis.  Mediastinum/Nodes: Visualized mediastinum and hilar regions demonstrate no lymphadenopathy or masses. There are some small nonenlarged mediastinal lymph nodes.  Lungs/Pleura: Visualized lungs show no evidence of pulmonary edema, consolidation, pneumothorax, nodule or pleural fluid.  Upper Abdomen: No acute abnormality.  Musculoskeletal: Status post left mastectomy. No bony abnormalities are identified in the visualized chest.  IMPRESSION: Atherosclerosis of the thoracic aorta. No significant incidental findings.  Electronically Signed: By: Irish Lack M.D. On: 06/16/2021 14:50              Recent Labs: No results found for requested labs within last 365 days.  Recent Lipid Panel No results found for: "CHOL", "TRIG", "HDL", "CHOLHDL", "VLDL", "LDLCALC", "LDLDIRECT"  Physical Exam:    VS:  BP (!) 152/92   Pulse (!) 46   Ht 5\' 7"  (1.702 m)   Wt (!) 303 lb (137.4 kg)   SpO2 92%   BMI 47.46 kg/m     Wt Readings from Last 3 Encounters:  09/29/23 (!) 303 lb (137.4 kg)  02/21/23 293 lb (132.9 kg)  07/01/22 276 lb 6.4 oz (125.4 kg)     GEN: Obese BMI exceeds 45 well nourished, well developed in no acute distress HEENT: Normal NECK: No JVD; No carotid bruits LYMPHATICS: No lymphadenopathy CARDIAC: RRR, no murmurs, rubs, gallops RESPIRATORY:  Clear to auscultation without rales, wheezing or rhonchi  ABDOMEN: Soft, non-tender, non-distended MUSCULOSKELETAL:  No edema; No deformity  SKIN: Warm and dry NEUROLOGIC:  Alert and oriented x 3 PSYCHIATRIC:  Normal affect     Signed, Norman Herrlich, MD  09/29/2023 11:50 AM    [facility]

## 2023-09-29 ENCOUNTER — Encounter: Payer: Self-pay | Admitting: Cardiology

## 2023-09-29 ENCOUNTER — Other Ambulatory Visit: Payer: Self-pay

## 2023-09-29 ENCOUNTER — Ambulatory Visit: Payer: Medicare Other | Attending: Cardiology | Admitting: Cardiology

## 2023-09-29 VITALS — BP 152/92 | HR 46 | Ht 67.0 in | Wt 303.0 lb

## 2023-09-29 DIAGNOSIS — I5032 Chronic diastolic (congestive) heart failure: Secondary | ICD-10-CM

## 2023-09-29 DIAGNOSIS — E782 Mixed hyperlipidemia: Secondary | ICD-10-CM

## 2023-09-29 DIAGNOSIS — Z952 Presence of prosthetic heart valve: Secondary | ICD-10-CM

## 2023-09-29 DIAGNOSIS — N1832 Chronic kidney disease, stage 3b: Secondary | ICD-10-CM | POA: Diagnosis not present

## 2023-09-29 DIAGNOSIS — I4821 Permanent atrial fibrillation: Secondary | ICD-10-CM | POA: Diagnosis not present

## 2023-09-29 DIAGNOSIS — I342 Nonrheumatic mitral (valve) stenosis: Secondary | ICD-10-CM

## 2023-09-29 DIAGNOSIS — I110 Hypertensive heart disease with heart failure: Secondary | ICD-10-CM | POA: Diagnosis not present

## 2023-09-29 DIAGNOSIS — Z7901 Long term (current) use of anticoagulants: Secondary | ICD-10-CM | POA: Diagnosis not present

## 2023-09-29 MED ORDER — APIXABAN 5 MG PO TABS: 5.0000 mg | ORAL_TABLET | Freq: Two times a day (BID) | ORAL | Status: DC

## 2023-09-29 NOTE — Patient Instructions (Addendum)
Medication Instructions:  Your physician has recommended you make the following change in your medication:   START: Metoprolol tartrate 25 mg two times daily  *If you need a refill on your cardiac medications before your next appointment, please call your pharmacy*   Lab Work: None If you have labs (blood work) drawn today and your tests are completely normal, you will receive your results only by: MyChart Message (if you have MyChart) OR A paper copy in the mail If you have any lab test that is abnormal or we need to change your treatment, we will call you to review the results.   Testing/Procedures: Your physician has requested that you have an echocardiogram. Echocardiography is a painless test that uses sound waves to create images of your heart. It provides your doctor with information about the size and shape of your heart and how well your heart's chambers and valves are working. This procedure takes approximately one hour. There are no restrictions for this procedure. Please do NOT wear cologne, perfume, aftershave, or lotions (deodorant is allowed). Please arrive 15 minutes prior to your appointment time.  Please note: We ask at that you not bring children with you during ultrasound (echo/ vascular) testing. Due to room size and safety concerns, children are not allowed in the ultrasound rooms during exams. Our front office staff cannot provide observation of children in our lobby area while testing is being conducted. An adult accompanying a patient to their appointment will only be allowed in the ultrasound room at the discretion of the ultrasound technician under special circumstances. We apologize for any inconvenience.   Follow-Up: At [facility], you and your health needs are our priority.  As part of our continuing mission to provide you with exceptional heart care, we have created designated Provider Care Teams.  These Care Teams include your primary Cardiologist  (physician) and Advanced Practice Providers (APPs -  Physician Assistants and Nurse Practitioners) who all work together to provide you with the care you need, when you need it.  We recommend signing up for the patient portal called "MyChart".  Sign up information is provided on this After Visit Summary.  MyChart is used to connect with patients for Virtual Visits (Telemedicine).  Patients are able to view lab/test results, encounter notes, upcoming appointments, etc.  Non-urgent messages can be sent to your provider as well.   To learn more about what you can do with MyChart, go to [email or web address].    Your next appointment:   6 month(s)  Provider:   Norman Herrlich, MD    Other Instructions None

## 2023-10-18 DIAGNOSIS — N1832 Chronic kidney disease, stage 3b: Secondary | ICD-10-CM | POA: Diagnosis not present

## 2023-10-18 DIAGNOSIS — Z780 Asymptomatic menopausal state: Secondary | ICD-10-CM | POA: Diagnosis not present

## 2023-10-18 DIAGNOSIS — M8580 Other specified disorders of bone density and structure, unspecified site: Secondary | ICD-10-CM | POA: Diagnosis not present

## 2023-10-18 DIAGNOSIS — Z79899 Other long term (current) drug therapy: Secondary | ICD-10-CM | POA: Diagnosis not present

## 2023-10-18 DIAGNOSIS — M2550 Pain in unspecified joint: Secondary | ICD-10-CM | POA: Diagnosis not present

## 2023-10-18 DIAGNOSIS — Z0000 Encounter for general adult medical examination without abnormal findings: Secondary | ICD-10-CM | POA: Diagnosis not present

## 2023-10-18 DIAGNOSIS — E7800 Pure hypercholesterolemia, unspecified: Secondary | ICD-10-CM | POA: Diagnosis not present

## 2023-10-20 ENCOUNTER — Other Ambulatory Visit: Payer: Medicare Other

## 2023-10-24 ENCOUNTER — Other Ambulatory Visit: Payer: Self-pay | Admitting: Cardiology

## 2023-10-24 DIAGNOSIS — I4891 Unspecified atrial fibrillation: Secondary | ICD-10-CM

## 2023-10-24 NOTE — Telephone Encounter (Signed)
 Prescription refill request for Eliquis received. Indication:afib Last office visit:1/25 Scr:1.4  2/25 Age: 78 Weight:137.4  kg  Prescription refilled

## 2023-10-25 ENCOUNTER — Telehealth: Payer: Self-pay | Admitting: Cardiology

## 2023-10-25 NOTE — Telephone Encounter (Signed)
 [phone] given to pt to see if she qualifies for social security assistance. PT will callback if unable to qualify. Pt verbalized understanding and had no additional questions.

## 2023-10-25 NOTE — Telephone Encounter (Signed)
 Pt c/o medication issue:  1. Name of Medication:   apixaban (ELIQUIS) 5 MG TABS tablet    2. How are you currently taking this medication (dosage and times per day)?    3. Are you having a reaction (difficulty breathing--STAT)? no  4. What is your medication issue? Patient is unable to afford the medication right now. Calling to see what other options are their. Please advise

## 2023-10-27 DIAGNOSIS — H353222 Exudative age-related macular degeneration, left eye, with inactive choroidal neovascularization: Secondary | ICD-10-CM | POA: Diagnosis not present

## 2023-10-27 DIAGNOSIS — H353211 Exudative age-related macular degeneration, right eye, with active choroidal neovascularization: Secondary | ICD-10-CM | POA: Diagnosis not present

## 2023-10-31 ENCOUNTER — Telehealth: Payer: Self-pay | Admitting: Pharmacist

## 2023-10-31 NOTE — Progress Notes (Signed)
 Attempted to contact patient for medication acces re: Eliquis. Left HIPAA compliant message for patient to return my call at their convenience.   Reynold Bowen, PharmD Clinical Pharmacist [place] Direct Dial: [phone]

## 2023-11-01 ENCOUNTER — Telehealth: Payer: Self-pay | Admitting: Pharmacist

## 2023-11-01 ENCOUNTER — Telehealth: Payer: Self-pay

## 2023-11-01 NOTE — Telephone Encounter (Signed)
 PAP: Patient assistance application for Eliquis through [employer] ([employer]) has been mailed to pt's home address on file. Provider portion of application will be faxed to provider's office.  Please note I have faxed the provider portion of the application to Dr. Norman Herrlich for review

## 2023-11-01 NOTE — Progress Notes (Addendum)
   11/01/2023  Patient ID: Lindsay [name], female   DOB: April 27, 1946, 78 y.o.   MRN: [id number]  Telephonic outreach to Lindsay [name]'s daughter in law,  Lindsay [name]. Reviewed patient assistance process for [employer] for Eliquis patient assistance. We will move forward with application to [employer] for Eliquis for 2025. Lindsay [name] is aware of the 3% out of pocket spend requirement. Lindsay [name] would like to be made aware of the required out of pocket spend amount for 2025 once application has been processed through [employer].   Plan:  Will route to [facility] patient advocate team for further coordination.   Reynold Bowen, PharmD Clinical Pharmacist [place] Direct Dial: [phone]

## 2023-11-10 ENCOUNTER — Ambulatory Visit: Payer: Medicare Other | Attending: Cardiology

## 2023-11-10 DIAGNOSIS — I342 Nonrheumatic mitral (valve) stenosis: Secondary | ICD-10-CM

## 2023-11-10 DIAGNOSIS — N1832 Chronic kidney disease, stage 3b: Secondary | ICD-10-CM

## 2023-11-10 DIAGNOSIS — I5032 Chronic diastolic (congestive) heart failure: Secondary | ICD-10-CM

## 2023-11-10 DIAGNOSIS — I110 Hypertensive heart disease with heart failure: Secondary | ICD-10-CM | POA: Diagnosis not present

## 2023-11-10 DIAGNOSIS — I4821 Permanent atrial fibrillation: Secondary | ICD-10-CM | POA: Diagnosis not present

## 2023-11-10 DIAGNOSIS — Z7901 Long term (current) use of anticoagulants: Secondary | ICD-10-CM | POA: Diagnosis not present

## 2023-11-10 DIAGNOSIS — E782 Mixed hyperlipidemia: Secondary | ICD-10-CM

## 2023-11-10 DIAGNOSIS — Z952 Presence of prosthetic heart valve: Secondary | ICD-10-CM

## 2023-11-10 LAB — ECHOCARDIOGRAM COMPLETE
AV Mean grad: 8.4 mmHg
AV Peak grad: 16.5 mmHg
Ao pk vel: 2.03 m/s
Area-P 1/2: 3.54 cm2
MV M vel: 6.16 m/s
MV Peak grad: 151.8 mmHg
Radius: 0.45 cm
S' Lateral: 3.3 cm

## 2023-11-17 NOTE — Telephone Encounter (Signed)
 Spoke with Lindsay [name] about her PAP applications for Eliquis.  She says her daughter in law has the applications as she works at [employer].  Will follow up with office to see where they are at.

## 2023-11-21 NOTE — Telephone Encounter (Signed)
 Received pt. Application via fax  PAP: Application for Eliquis has been submitted to [employer] ([employer]), via fax

## 2023-11-30 NOTE — Telephone Encounter (Signed)
 PAP: Patient has been denied for patient assistance by [employer] ([employer]) due to has not met 3% oop

## 2023-12-07 DIAGNOSIS — I129 Hypertensive chronic kidney disease with stage 1 through stage 4 chronic kidney disease, or unspecified chronic kidney disease: Secondary | ICD-10-CM | POA: Diagnosis not present

## 2023-12-07 DIAGNOSIS — I5032 Chronic diastolic (congestive) heart failure: Secondary | ICD-10-CM | POA: Diagnosis not present

## 2023-12-07 DIAGNOSIS — N1832 Chronic kidney disease, stage 3b: Secondary | ICD-10-CM | POA: Diagnosis not present

## 2023-12-07 DIAGNOSIS — I350 Nonrheumatic aortic (valve) stenosis: Secondary | ICD-10-CM | POA: Diagnosis not present

## 2023-12-22 DIAGNOSIS — H35033 Hypertensive retinopathy, bilateral: Secondary | ICD-10-CM | POA: Diagnosis not present

## 2023-12-22 DIAGNOSIS — H353211 Exudative age-related macular degeneration, right eye, with active choroidal neovascularization: Secondary | ICD-10-CM | POA: Diagnosis not present

## 2023-12-22 DIAGNOSIS — H43811 Vitreous degeneration, right eye: Secondary | ICD-10-CM | POA: Diagnosis not present

## 2023-12-22 DIAGNOSIS — D3131 Benign neoplasm of right choroid: Secondary | ICD-10-CM | POA: Diagnosis not present

## 2023-12-22 DIAGNOSIS — H353222 Exudative age-related macular degeneration, left eye, with inactive choroidal neovascularization: Secondary | ICD-10-CM | POA: Diagnosis not present

## 2024-01-26 ENCOUNTER — Other Ambulatory Visit: Payer: Self-pay | Admitting: Cardiology

## 2024-02-25 ENCOUNTER — Other Ambulatory Visit: Payer: Self-pay | Admitting: Cardiology

## 2024-03-15 DIAGNOSIS — H353211 Exudative age-related macular degeneration, right eye, with active choroidal neovascularization: Secondary | ICD-10-CM | POA: Diagnosis not present

## 2024-03-15 DIAGNOSIS — H35033 Hypertensive retinopathy, bilateral: Secondary | ICD-10-CM | POA: Diagnosis not present

## 2024-03-15 DIAGNOSIS — H353222 Exudative age-related macular degeneration, left eye, with inactive choroidal neovascularization: Secondary | ICD-10-CM | POA: Diagnosis not present

## 2024-03-15 DIAGNOSIS — H43811 Vitreous degeneration, right eye: Secondary | ICD-10-CM | POA: Diagnosis not present

## 2024-03-15 DIAGNOSIS — D3131 Benign neoplasm of right choroid: Secondary | ICD-10-CM | POA: Diagnosis not present

## 2024-03-16 ENCOUNTER — Ambulatory Visit

## 2024-03-16 VITALS — BP 174/90 | HR 84 | Ht 67.0 in | Wt 300.0 lb

## 2024-03-16 DIAGNOSIS — I340 Nonrheumatic mitral (valve) insufficiency: Secondary | ICD-10-CM | POA: Insufficient documentation

## 2024-03-16 DIAGNOSIS — I482 Chronic atrial fibrillation, unspecified: Secondary | ICD-10-CM | POA: Diagnosis not present

## 2024-03-16 DIAGNOSIS — I5032 Chronic diastolic (congestive) heart failure: Secondary | ICD-10-CM

## 2024-03-16 DIAGNOSIS — E782 Mixed hyperlipidemia: Secondary | ICD-10-CM

## 2024-03-16 DIAGNOSIS — I10 Essential (primary) hypertension: Secondary | ICD-10-CM | POA: Diagnosis not present

## 2024-03-16 DIAGNOSIS — Z952 Presence of prosthetic heart valve: Secondary | ICD-10-CM

## 2024-03-16 DIAGNOSIS — I110 Hypertensive heart disease with heart failure: Secondary | ICD-10-CM | POA: Diagnosis not present

## 2024-03-16 DIAGNOSIS — I4821 Permanent atrial fibrillation: Secondary | ICD-10-CM | POA: Diagnosis not present

## 2024-03-16 DIAGNOSIS — Z7901 Long term (current) use of anticoagulants: Secondary | ICD-10-CM | POA: Diagnosis not present

## 2024-03-16 HISTORY — DX: Nonrheumatic mitral (valve) insufficiency: I34.0

## 2024-03-16 MED ORDER — HYDRALAZINE HCL 25 MG PO TABS: 37.5000 mg | ORAL_TABLET | Freq: Two times a day (BID) | ORAL | 3 refills | Status: AC

## 2024-03-16 NOTE — Assessment & Plan Note (Signed)
 Chronic heart failure, appears hypervolemic. Compensated.  Recommended salt restriction below 2 g/day. Fluid restriction below 64 ounces per day recommended. Continue loop diuretic furosemide  40 mg once daily.  Advised to keep her feet elevated and have compression socks on during the day when she is up and about.  Guideline-directed medical therapy limited due to renal function.  Continue metoprolol  tartrate 25 mg twice daily. Increase hydralazine  dose to 37.5 mg twice daily [currently she is only taking 25 mg twice daily]. Will further consider adding isosorbide dinitrate if blood pressures are not well-controlled or she continues to have have any significant symptoms of worsening heart failure.  Will check blood work for CMP,  proBNP and CBC.  If renal function stable, will consider adding SGLT2 inhibitors such as Jardiance or Farxiga.

## 2024-03-16 NOTE — Assessment & Plan Note (Signed)
 Last lipid panel to review is from 10-18-2023. Continue atorvastatin  10 mg once daily.

## 2024-03-16 NOTE — Assessment & Plan Note (Signed)
 Permanent atrial fibrillation, rate controlled. Continue metoprolol  tartrate 25 mg twice daily.  Elevated CHADS2 Vasc score remains on anticoagulation with Eliquis  5 mg twice daily [age 78, weight greater than 60 kg, creatinine last checked 12/07/2023 1.3]. If creatinine greater than 1.5 on repeat blood work we will have to reduce the dose of Eliquis  to 2.5 mg twice daily.

## 2024-03-16 NOTE — Assessment & Plan Note (Signed)
 Suboptimally controlled. Titrate up hydralazine  to 37.5 mg twice daily. She is hesitant to further titrated up higher at this time or add new medications. Continue losartan 50 mg once daily. Continue metoprolol  tartrate 25 mg twice daily.  Advised her to maintain a log of blood pressures and forward to us  in 2 weeks.

## 2024-03-16 NOTE — Patient Instructions (Signed)
 Medication Instructions:  Your physician has recommended you make the following change in your medication:   START: Hydralazine  37.5 mg two times daily  *If you need a refill on your cardiac medications before your next appointment, please call your pharmacy*  Lab Work: Your physician recommends that you return for lab work in:   Labs today: CMP, CBC, Pro BNP  If you have labs (blood work) drawn today and your tests are completely normal, you will receive your results only by: MyChart Message (if you have MyChart) OR A paper copy in the mail If you have any lab test that is abnormal or we need to change your treatment, we will call you to review the results.  Testing/Procedures: None  Follow-Up: At [facility], you and your health needs are our priority.  As part of our continuing mission to provide you with exceptional heart care, our providers are all part of one team.  This team includes your primary Cardiologist (physician) and Advanced Practice Providers or APPs (Physician Assistants and Nurse Practitioners) who all work together to provide you with the care you need, when you need it.  Your next appointment:   3 month(s)  Provider:   Alean Kobus, MD    We recommend signing up for the patient portal called MyChart.  Sign up information is provided on this After Visit Summary.  MyChart is used to connect with patients for Virtual Visits (Telemedicine).  Patients are able to view lab/test results, encounter notes, upcoming appointments, etc.  Non-urgent messages can be sent to your provider as well.   To learn more about what you can do with MyChart, go to [email or web address].   Other Instructions Please keep a BP log for 2 weeks and send by MyChart or mail.                          Name and DOB__________________________ Dr. Madireddy [address] [place], [place] [address]  Blood Pressure Record Sheet To take your blood pressure, you will need a blood  pressure machine. You can buy a blood pressure machine (blood pressure monitor) at your clinic, drug store, or online. When choosing one, consider: An automatic monitor that has an arm cuff. A cuff that wraps snugly around your upper arm. You should be able to fit only one finger between your arm and the cuff. A device that stores blood pressure reading results. Do not choose a monitor that measures your blood pressure from your wrist or finger. Follow your health care provider's instructions for how to take your blood pressure. To use this form: Get one reading in the morning (a.m.) 1-2 hours after you take any medicines. Get one reading in the evening (p.m.) before supper.   Blood pressure log Date: _______________________  a.m. _____________________(1st reading) HR___________            p.m. _____________________(2nd reading) HR__________  Date: _______________________  a.m. _____________________(1st reading) HR___________            p.m. _____________________(2nd reading) HR__________  Date: _______________________  a.m. _____________________(1st reading) HR___________            p.m. _____________________(2nd reading) HR__________  Date: _______________________  a.m. _____________________(1st reading) HR___________            p.m. _____________________(2nd reading) HR__________  Date: _______________________  a.m. _____________________(1st reading) HR___________            p.m. _____________________(2nd reading) HR__________  Date: _______________________  a.m. _____________________(1st  reading) HR___________            p.m. _____________________(2nd reading) HR__________  Date: _______________________  a.m. _____________________(1st reading) HR___________            p.m. _____________________(2nd reading) HR__________   This information is not intended to replace advice given to you by your health care provider. Make sure you discuss any questions you have  with your health care provider. Document Revised: 12/05/2019 Document Reviewed: 12/05/2019 Elsevier Patient Education  2021 [employer].

## 2024-03-16 NOTE — Assessment & Plan Note (Signed)
 Moderate mitral regurgitation associated with mild mitral stenosis with mean gradient 5 mmHg on echocardiogram 11/10/2023.  Continue to follow-up with echocardiogram annually. Fluid management salt and fluid restriction and diuretic as discussed under heart failure.

## 2024-03-16 NOTE — Progress Notes (Signed)
 Cardiology Consultation:    Date:  03/16/2024   ID:  Lindsay [name], DOB 13-Mar-1946, MRN [id number]  PCP:  Lindsay Marcellus RAMAN, MD  Cardiologist:  Lindsay SAUNDERS Bethzaida Boord, MD   Referring MD: Lindsay Marcellus RAMAN, MD   No chief complaint on file.    ASSESSMENT AND PLAN:   [name] 78 year old woman  history of severe aortic stenosis s/p TAVR June 2023, chronic diastolic CHF, mild mitral stenosis , minimal nonobstructive coronary atherosclerosis on cardiac cath March 2023, permanent atrial fibrillation remains on anticoagulant, obesity, breast cancer s/p mastectomy and chemotherapy, CKD stage IIIb, osteoarthritis.  Echocardiogram from 11/10/2023 noted normal biventricular function with moderate LVH, moderate MR and mild mitral stenosis with mean gradient 5 mmHg, RVSP 56 mmHg moderately elevated, aortic valve prosthesis well-seated normal functioning with mean gradient 8 mmHg.  Problem List Items Addressed This Visit     Hyperlipidemia   Last lipid panel to review is from 10-18-2023. Continue atorvastatin  10 mg once daily.       Relevant Medications   hydrALAZINE  (APRESOLINE ) 25 MG tablet   Essential hypertension   Suboptimally controlled. Titrate up hydralazine  to 37.5 mg twice daily. She is hesitant to further titrated up higher at this time or add new medications. Continue losartan 50 mg once daily. Continue metoprolol  tartrate 25 mg twice daily.  Advised her to maintain a log of blood pressures and forward to us  in 2 weeks.       Relevant Medications   hydrALAZINE  (APRESOLINE ) 25 MG tablet   Chronic atrial fibrillation (HCC)   Permanent atrial fibrillation, rate controlled. Continue metoprolol  tartrate 25 mg twice daily.  Elevated CHADS2 Vasc score remains on anticoagulation with Eliquis  5 mg twice daily [age 14, weight greater than 60 kg, creatinine last checked 12/07/2023 1.3]. If creatinine greater than 1.5 on repeat blood work we will have to reduce the dose of Eliquis  to 2.5 mg  twice daily.        Relevant Medications   hydrALAZINE  (APRESOLINE ) 25 MG tablet   Other Relevant Orders   EKG 12-Lead (Completed)   Comp Met (CMET)   Chronic diastolic (congestive) heart failure (HCC) - Primary   Chronic heart failure, appears hypervolemic. Compensated.  Recommended salt restriction below 2 g/day. Fluid restriction below 64 ounces per day recommended. Continue loop diuretic furosemide  40 mg once daily.  Advised to keep her feet elevated and have compression socks on during the day when she is up and about.  Guideline-directed medical therapy limited due to renal function.  Continue metoprolol  tartrate 25 mg twice daily. Increase hydralazine  dose to 37.5 mg twice daily [currently she is only taking 25 mg twice daily]. Will further consider adding isosorbide dinitrate if blood pressures are not well-controlled or she continues to have have any significant symptoms of worsening heart failure.  Will check blood work for CMP,  proBNP and CBC.  If renal function stable, will consider adding SGLT2 inhibitors such as Jardiance or Farxiga.       Relevant Medications   hydrALAZINE  (APRESOLINE ) 25 MG tablet   Other Relevant Orders   Comp Met (CMET)   CBC   Pro b natriuretic peptide (BNP)   S/P TAVR (transcatheter aortic valve replacement)   Well-functioning device on last echocardiogram March 2025. Continue to monitor. Continue with antibiotic for infective endocarditis prophylaxis prior to any surgical procedures or dental procedures.      Moderate mitral regurgitation   Moderate mitral regurgitation associated with mild mitral stenosis with mean gradient  5 mmHg on echocardiogram 11/10/2023.  Continue to follow-up with echocardiogram annually. Fluid management salt and fluid restriction and diuretic as discussed under heart failure.       Relevant Medications   hydrALAZINE  (APRESOLINE ) 25 MG tablet    Return to clinic tentatively in 3 months.  History  of Present Illness:    Lindsay [name] is a 78 y.o. female who is being seen today for visit. PCP is Lindsay Marcellus RAMAN, MD. Last visit with as well as 09-29-2023 with Dr. Monetta. Also follows up with nephrologist at [facility]  Pleasant woman here for the visit by herself.  Lives with her husband at home. Uses a cane to ambulate to help with balance when she is outdoors.  Has history of severe aortic stenosis s/p TAVR June 2023, chronic diastolic CHF, mild mitral stenosis , minimal nonobstructive coronary atherosclerosis on cardiac cath March 2023, permanent atrial fibrillation remains on anticoagulant, obesity, breast cancer s/p mastectomy and chemotherapy, CKD stage IIIb, osteoarthritis.  Last echocardiogram 11/10/2023 reported LVEF 55 to 60% with atrial fibrillation, moderate LVH, moderate MR, and mean gradient across mitral valve 5 mmHg RVSP moderately elevated 56 mmHg, well-seated aortic valve bioprosthesis with mean gradient 8 mmHg  Mentions overall she has been doing well.  Denies any symptoms of chest pain. Does report bilateral lower extremity edema which is worse towards the end of the day.  Uses compression socks consistently. Denies any palpitations, lightheadedness. Denies any orthopnea.  Mentions she makes good amount of urine.  Good compliance with medications reported. She is however taking hydralazine  only 25 mg twice daily.  EKG in the clinic today shows atrial fibrillation with ventricular rate 84/min, isolated PVC observed.  QRS 106 ms, left axis.  No significant change in comparison to prior EKG from February 21, 2023.  Past Medical History:  Diagnosis Date   Antineoplastic chemotherapy induced anemia 11/03/2017   Arthritis    Asymptomatic bacteriuria 01/18/2022   Baker's cyst of knee, left 02/12/2020   Breast cancer (HCC)    Chronic atrial fibrillation (HCC) 01/18/2022   Chronic diastolic (congestive) heart failure (HCC)    Chronic kidney disease (CKD), stage IV  (severe) (HCC)    Chronic pain of left knee 02/12/2020   COPD (chronic obstructive pulmonary disease) (HCC) 01/18/2022   Diverticulitis 01/18/2022   Essential hypertension    History of kidney stones    History of left breast cancer 12/06/2017   Hypercalcemia 01/18/2022   Hyperlipidemia 04/11/2019   Hypoxia 01/18/2022   Malignant neoplasm of upper-inner quadrant of left breast in female, estrogen receptor negative (HCC) 06/21/2017   Neutropenic fever (HCC) 01/18/2022   PAF (paroxysmal atrial fibrillation) (HCC)    PAT (paroxysmal atrial tachycardia) (HCC) 10/30/2016   Personal history of colonic polyps    Personal history of skin cancer    Port-A-Cath in place 07/01/2017   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 Wonda & Dr. Lucas   Severe aortic stenosis 11/17/2021   SVT (supraventricular tachycardia) (HCC) 05/31/2018   Uncontrolled hypertension 01/18/2022    Past Surgical History:  Procedure Laterality Date   COLONOSCOPY     FOOT SURGERY Right    INTRAOPERATIVE TRANSTHORACIC ECHOCARDIOGRAM N/A 02/09/2022   Procedure: INTRAOPERATIVE TRANSTHORACIC ECHOCARDIOGRAM;  Surgeon: Wonda Sharper, MD;  Location: [facility] INVASIVE CV LAB;  Service: Open Heart Surgery;  Laterality: N/A;   IR CV LINE INJECTION  08/02/2017   left  breast lumpectomy  2000   MASTECTOMY WITH AXILLARY LYMPH NODE DISSECTION Left 12/06/2017   Procedure: LEFT MASTECTOMY WITH TARGETED LYMPH NODE DISSECTION;  Surgeon: Vernetta Berg, MD;  Location: [facility] OR;  Service: General;  Laterality: Left;   PORT-A-CATH REMOVAL Right 01/13/2018   Procedure: REMOVAL PORT-A-CATH;  Surgeon: Vernetta Berg, MD;  Location: [facility];  Service: General;  Laterality: Right;   PORTACATH PLACEMENT Right 06/30/2017   Procedure: INSERTION PORT-A-CATH ERAS PATHWAY;  Surgeon: Vernetta Berg, MD;  Location: [facility] OR;  Service: General;  Laterality:  Right;   RIGHT HEART CATH AND CORONARY ANGIOGRAPHY N/A 11/23/2021   Procedure: RIGHT HEART CATH AND CORONARY ANGIOGRAPHY;  Surgeon: Wonda Sharper, MD;  Location: [facility] INVASIVE CV LAB;  Service: Cardiovascular;  Laterality: N/A;   TRANSCATHETER AORTIC VALVE REPLACEMENT, TRANSFEMORAL N/A 02/09/2022   Procedure: Transcatheter Aortic Valve Replacement, Transfemoral;  Surgeon: Wonda Sharper, MD;  Location: [facility] INVASIVE CV LAB;  Service: Open Heart Surgery;  Laterality: N/A;    Current Medications: Current Meds  Medication Sig   acetaminophen  (TYLENOL ) 500 MG tablet Take 1,000 mg by mouth every 6 (six) hours as needed for moderate pain or headache.   albuterol  (VENTOLIN  HFA) 108 (90 Base) MCG/ACT inhaler Inhale 2 puffs into the lungs every 4 (four) hours as needed for shortness of breath or wheezing.   atorvastatin  (LIPITOR) 10 MG tablet Take 10 mg by mouth at bedtime.   BREO ELLIPTA  100-25 MCG/ACT AEPB Inhale 1 puff into the lungs daily.   ELIQUIS  5 MG TABS tablet TAKE 1 TABLET BY MOUTH TWICE A DAY   furosemide  (LASIX ) 40 MG tablet TAKE 1 TABLET BY MOUTH EVERY DAY   hydrALAZINE  (APRESOLINE ) 25 MG tablet Take 1.5 tablets (37.5 mg total) by mouth 2 (two) times daily.   losartan (COZAAR) 50 MG tablet Take 50 mg by mouth daily.   Magnesium  250 MG TABS Take 250 mg by mouth daily.   metoprolol  tartrate (LOPRESSOR ) 25 MG tablet TAKE 1 TABLET BY MOUTH TWICE A DAY   montelukast  (SINGULAIR ) 10 MG tablet Take 10 mg by mouth daily.   Omega-3 1000 MG CAPS Take 1,000 mg by mouth 2 (two) times daily.   potassium chloride  (KLOR-CON ) 10 MEQ tablet Take 1 tablet (10 mEq total) by mouth daily.   [DISCONTINUED] hydrALAZINE  (APRESOLINE ) 25 MG tablet Take 37.5 mg by mouth 3 (three) times daily. (Patient taking differently: Take 37.5 mg by mouth 2 (two) times daily.)     Allergies:   Patient has no known allergies.   Social History   Socioeconomic History   Marital status: Married    Spouse name: Christopher   Number  of children: 2   Years of education: Not on file   Highest education level: Not on file  Occupational History   Not on file  Tobacco Use   Smoking status: Former    Current packs/day: 0.00    Average packs/day: 2.0 packs/day for 50.0 years (100.0 ttl pk-yrs)    Types: Cigarettes    Start date: 06/02/1964    Quit date: 06/02/2014    Years since quitting: 9.7    Passive exposure: Past   Smokeless tobacco: Never  Vaping Use   Vaping status: Never Used  Substance and Sexual Activity   Alcohol use: No   Drug use: No   Sexual activity: Yes  Other Topics Concern   Not on file  Social History Narrative   Not on file   Social Drivers of Health   Financial  Resource Strain: Not on file  Food Insecurity: Not on file  Transportation Needs: Not on file  Physical Activity: Not on file  Stress: Not on file  Social Connections: Not on file     Family History: The patient's family history includes Basal cell carcinoma in her sister; Breast cancer in her paternal aunt; Breast cancer (age of onset: 58) in her sister; Breast cancer (age of onset: 51) in her sister; Colon cancer (age of onset: 61) in her sister; Melanoma in her sister; Pancreatic cancer (age of onset: 51) in her mother. ROS:   Please see the history of present illness.    All 14 point review of systems negative except as described per history of present illness.  EKGs/Labs/Other Studies Reviewed:    The following studies were reviewed today:   EKG:  EKG Interpretation Date/Time:  Friday March 16 2024 09:40:47 EDT Ventricular Rate:  84 PR Interval:    QRS Duration:  106 QT Interval:  396 QTC Calculation: 467 R Axis:   -28  Text Interpretation: Atrial fibrillation with premature ventricular or aberrantly conducted complexes Left ventricular hypertrophy with repolarization abnormality ( R in aVL , Cornell product ) When compared with ECG of 21-Feb-2023 16:11, No significant change was found Confirmed by Liborio Hai reddy [phone]) on 03/16/2024 9:49:25 AM    Recent Labs: No results found for requested labs within last 365 days.  Recent Lipid Panel No results found for: CHOL, TRIG, HDL, CHOLHDL, VLDL, LDLCALC, LDLDIRECT  Physical Exam:    VS:  BP (!) 174/90 (BP Location: Right Arm)   Pulse 84   Ht 5' 7 (1.702 m)   Wt 300 lb (136.1 kg)   SpO2 97%   BMI 46.99 kg/m     Wt Readings from Last 3 Encounters:  03/16/24 300 lb (136.1 kg)  09/29/23 (!) 303 lb (137.4 kg)  02/21/23 293 lb (132.9 kg)     GENERAL:  Well nourished, well developed in no acute distress NECK: No JVD; No carotid bruits CARDIAC: Irregularly irregular, S1 and S2 present, 3/6 systolic murmur best heard left parasternally. CHEST:  Clear to auscultation without rales, wheezing or rhonchi  Extremities: 1+ bilateral pitting pedal edema extending up to the knees. Pulses bilaterally symmetric with radial 2+ and dorsalis pedis 2+ NEUROLOGIC:  Alert and oriented x 3  Medication Adjustments/Labs and Tests Ordered: Current medicines are reviewed at length with the patient today.  Concerns regarding medicines are outlined above.  Orders Placed This Encounter  Procedures   Comp Met (CMET)   CBC   Pro b natriuretic peptide (BNP)   EKG 12-Lead   Meds ordered this encounter  Medications   hydrALAZINE  (APRESOLINE ) 25 MG tablet    Sig: Take 1.5 tablets (37.5 mg total) by mouth 2 (two) times daily.    Dispense:  180 tablet    Refill:  3    Signed, Celes Dedic reddy Alamin Mccuiston, MD, MPH, [facility]. 03/16/2024 10:16 AM    [facility]

## 2024-03-16 NOTE — Assessment & Plan Note (Signed)
 Well-functioning device on last echocardiogram March 2025. Continue to monitor. Continue with antibiotic for infective endocarditis prophylaxis prior to any surgical procedures or dental procedures.

## 2024-03-17 ENCOUNTER — Ambulatory Visit: Payer: Self-pay

## 2024-03-17 LAB — CBC
Hematocrit: 45 % (ref 34.0–46.6)
Hemoglobin: 14.4 g/dL (ref 11.1–15.9)
MCH: 30.1 pg (ref 26.6–33.0)
MCHC: 32 g/dL (ref 31.5–35.7)
MCV: 94 fL (ref 79–97)
Platelets: 211 x10E3/uL (ref 150–450)
RBC: 4.78 x10E6/uL (ref 3.77–5.28)
RDW: 14.6 % (ref 11.7–15.4)
WBC: 9.6 x10E3/uL (ref 3.4–10.8)

## 2024-03-17 LAB — COMPREHENSIVE METABOLIC PANEL WITH GFR
ALT: 19 IU/L (ref 0–32)
AST: 26 IU/L (ref 0–40)
Albumin: 4.5 g/dL (ref 3.8–4.8)
Alkaline Phosphatase: 56 IU/L (ref 44–121)
BUN/Creatinine Ratio: 15 (ref 12–28)
BUN: 20 mg/dL (ref 8–27)
Bilirubin Total: 0.9 mg/dL (ref 0.0–1.2)
CO2: 22 mmol/L (ref 20–29)
Calcium: 9.9 mg/dL (ref 8.7–10.3)
Chloride: 106 mmol/L (ref 96–106)
Creatinine, Ser: 1.36 mg/dL — ABNORMAL HIGH (ref 0.57–1.00)
Globulin, Total: 2.7 g/dL (ref 1.5–4.5)
Glucose: 111 mg/dL — ABNORMAL HIGH (ref 70–99)
Potassium: 4 mmol/L (ref 3.5–5.2)
Sodium: 145 mmol/L — ABNORMAL HIGH (ref 134–144)
Total Protein: 7.2 g/dL (ref 6.0–8.5)
eGFR: 39 mL/min/1.73 — ABNORMAL LOW (ref >59)

## 2024-03-17 LAB — PRO B NATRIURETIC PEPTIDE: NT-Pro BNP: 2974 pg/mL — ABNORMAL HIGH (ref 0–738)

## 2024-03-27 ENCOUNTER — Ambulatory Visit: Admitting: Cardiology

## 2024-04-27 DIAGNOSIS — I480 Paroxysmal atrial fibrillation: Secondary | ICD-10-CM | POA: Diagnosis not present

## 2024-04-27 DIAGNOSIS — N1832 Chronic kidney disease, stage 3b: Secondary | ICD-10-CM | POA: Diagnosis not present

## 2024-04-27 DIAGNOSIS — G629 Polyneuropathy, unspecified: Secondary | ICD-10-CM | POA: Diagnosis not present

## 2024-04-27 DIAGNOSIS — R7303 Prediabetes: Secondary | ICD-10-CM | POA: Diagnosis not present

## 2024-04-27 DIAGNOSIS — I10 Essential (primary) hypertension: Secondary | ICD-10-CM | POA: Diagnosis not present

## 2024-04-27 DIAGNOSIS — E7800 Pure hypercholesterolemia, unspecified: Secondary | ICD-10-CM | POA: Diagnosis not present

## 2024-04-27 DIAGNOSIS — Y738 Miscellaneous gastroenterology and urology devices associated with adverse incidents, not elsewhere classified: Secondary | ICD-10-CM | POA: Diagnosis not present

## 2024-04-27 DIAGNOSIS — D519 Vitamin B12 deficiency anemia, unspecified: Secondary | ICD-10-CM | POA: Diagnosis not present

## 2024-04-27 LAB — LAB REPORT - SCANNED
A1c: 5.8
EGFR: 39

## 2024-05-02 DIAGNOSIS — M7021 Olecranon bursitis, right elbow: Secondary | ICD-10-CM | POA: Diagnosis not present

## 2024-05-03 ENCOUNTER — Other Ambulatory Visit: Payer: Self-pay | Admitting: Cardiology

## 2024-05-03 DIAGNOSIS — I4891 Unspecified atrial fibrillation: Secondary | ICD-10-CM

## 2024-05-03 NOTE — Telephone Encounter (Signed)
 Eliquis  5mg  refill request received. Patient is 78 years old, weight-136.1kg, Crea-1.36 on 03/16/24, Diagnosis-Afib, and last seen by Dr. Liborio on 03/16/24. Dose is appropriate based on dosing criteria. Will send in refill to requested pharmacy.

## 2024-06-12 DIAGNOSIS — Z23 Encounter for immunization: Secondary | ICD-10-CM | POA: Diagnosis not present

## 2024-06-13 DIAGNOSIS — I129 Hypertensive chronic kidney disease with stage 1 through stage 4 chronic kidney disease, or unspecified chronic kidney disease: Secondary | ICD-10-CM | POA: Diagnosis not present

## 2024-06-13 DIAGNOSIS — N1832 Chronic kidney disease, stage 3b: Secondary | ICD-10-CM | POA: Diagnosis not present

## 2024-06-13 DIAGNOSIS — R809 Proteinuria, unspecified: Secondary | ICD-10-CM | POA: Diagnosis not present

## 2024-06-13 DIAGNOSIS — I5032 Chronic diastolic (congestive) heart failure: Secondary | ICD-10-CM | POA: Diagnosis not present

## 2024-06-14 LAB — LAB REPORT - SCANNED
Albumin, Urine POC: 45.5
Creatinine, POC: 63.9 mg/dL
EGFR: 32
Microalb Creat Ratio: 71

## 2024-06-20 ENCOUNTER — Other Ambulatory Visit: Payer: Self-pay

## 2024-06-20 DIAGNOSIS — I480 Paroxysmal atrial fibrillation: Secondary | ICD-10-CM | POA: Insufficient documentation

## 2024-06-22 NOTE — Progress Notes (Signed)
 Cardiology Office Note:    Date:  06/25/2024   ID:  Lindsay [name], DOB 1946-04-10, MRN [id number]  PCP:  Ina Marcellus RAMAN, MD  Cardiologist:  Redell Leiter, MD    Referring MD: Ina Marcellus RAMAN, MD    ASSESSMENT:    1. S/P TAVR (transcatheter aortic valve replacement)   2. Hypertensive heart disease with chronic diastolic congestive heart failure (HCC)   3. Chronic atrial fibrillation (HCC)   4. Chronic anticoagulation   5. Stage 3b chronic kidney disease (HCC)    PLAN:    In order of problems listed above:  [name] continues to do well long-term after TAVR no evidence of valve dysfunction heart failure is well compensated atrial fibrillation with a controlled rate and she will continue her current anticoagulant but reduced dose with age now 78 years old and CKD BP well-controlled continue her loop diuretic and ARB and hydralazine    Next appointment: 9 months follow-up TAVR surveillance echo March 2026   Medication Adjustments/Labs and Tests Ordered: Current medicines are reviewed at length with the patient today.  Concerns regarding medicines are outlined above.  No orders of the defined types were placed in this encounter.  No orders of the defined types were placed in this encounter.    History of Present Illness:    Lindsay [name] is a 78 y.o. female with a hx of symptomatic severe aortic stenosis with heart failure and TAVR June 2023 chronic atrial fibrillation on long-term anticoagulation therapy and rate control obesity with a BMI exceeding 40 history of breast cancer with mastectomy and chemotherapy and stage IIIb CKD last seen by me on 09/29/2023.  She subsequent had an echocardiogram 11/10/2023 showing preserved left ventricular function EF 55 to 60% moderate concentric LVH and indeterminate diastolic.  TAVR function normal.  Mitral valve also shows senile calcific degeneration with moderate regurgitation but no stenosis.  This  Compliance with diet, lifestyle and medications:  Yes  If not for right ankle pain and gait dysfunction she uses a cane she would be doing well. From a cardiac perspective she is doing well and having no cardiovascular symptoms of edema shortness of breath chest pain palpitation or syncope She recently saw her nephrologist she had labs done and was told her kidney function was improved Each 6 months she has labs performed with her PCP. Most recent lipid profile February cholesterol 171 HDL 55 hemoglobin 15.0 in July creatinine 1.36 potassium 4.0 magnesium  2.1 She has had no edema orthopnea on her current loop diuretic Past Medical History:  Diagnosis Date   Antineoplastic chemotherapy induced anemia 11/03/2017   Arthritis    Asymptomatic bacteriuria 01/18/2022   Baker's cyst of knee, left 02/12/2020   Breast cancer (HCC)    Chronic atrial fibrillation (HCC) 01/18/2022   Chronic diastolic (congestive) heart failure (HCC)    Chronic kidney disease (CKD), stage IV (severe) (HCC)    Chronic pain of left knee 02/12/2020   COPD (chronic obstructive pulmonary disease) (HCC) 01/18/2022   Diverticulitis 01/18/2022   Essential hypertension    History of kidney stones    History of left breast cancer 12/06/2017   Hypercalcemia 01/18/2022   Hyperlipidemia 04/11/2019   Hypoxia 01/18/2022   Malignant neoplasm of upper-inner quadrant of left breast in female, estrogen receptor negative (HCC) 06/21/2017   Moderate mitral regurgitation 03/16/2024   Neutropenic fever 01/18/2022   PAF (paroxysmal atrial fibrillation) (HCC)    PAT (paroxysmal atrial tachycardia) 10/30/2016   Personal history of skin cancer  Port-A-Cath in place 07/01/2017   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 Wonda & Dr. Lucas   Severe aortic stenosis 11/17/2021   SVT (supraventricular tachycardia) 05/31/2018   Uncontrolled hypertension 01/18/2022    Current Medications: Current Meds   Medication Sig   acetaminophen  (TYLENOL ) 500 MG tablet Take 1,000 mg by mouth every 6 (six) hours as needed for moderate pain or headache.   albuterol  (VENTOLIN  HFA) 108 (90 Base) MCG/ACT inhaler Inhale 2 puffs into the lungs every 4 (four) hours as needed for shortness of breath or wheezing.   atorvastatin  (LIPITOR) 10 MG tablet Take 10 mg by mouth at bedtime.   BREO ELLIPTA  100-25 MCG/ACT AEPB Inhale 1 puff into the lungs daily.   ELIQUIS  5 MG TABS tablet TAKE 1 TABLET BY MOUTH TWICE A DAY   furosemide  (LASIX ) 40 MG tablet TAKE 1 TABLET BY MOUTH EVERY DAY   hydrALAZINE  (APRESOLINE ) 25 MG tablet Take 1.5 tablets (37.5 mg total) by mouth 2 (two) times daily.   ipratropium-albuterol  (DUONEB) 0.5-2.5 (3) MG/3ML SOLN Take 3 mLs by nebulization.   losartan (COZAAR) 50 MG tablet Take 50 mg by mouth daily.   Magnesium  250 MG TABS Take 250 mg by mouth daily.   metoprolol  tartrate (LOPRESSOR ) 25 MG tablet TAKE 1 TABLET BY MOUTH TWICE A DAY   montelukast  (SINGULAIR ) 10 MG tablet Take 10 mg by mouth daily.   Omega-3 1000 MG CAPS Take 1,000 mg by mouth 2 (two) times daily.   potassium chloride  (KLOR-CON ) 10 MEQ tablet Take 1 tablet (10 mEq total) by mouth daily.      EKGs/Labs/Other Studies Reviewed:    The following studies were reviewed today:  No results found for: CHOL, TRIG, HDL, CHOLHDL, VLDL, LDLCALC, LDLDIRECT  Physical Exam:    VS:  BP 138/82   Pulse 76   Ht 5' 7 (1.702 m)   Wt 297 lb 12.8 oz (135.1 kg)   SpO2 95%   BMI 46.64 kg/m     Wt Readings from Last 3 Encounters:  06/25/24 297 lb 12.8 oz (135.1 kg)  03/16/24 300 lb (136.1 kg)  09/29/23 (!) 303 lb (137.4 kg)     GEN: Obese BMI greater than 45 well nourished, well developed in no acute distress HEENT: Normal NECK: No JVD; No carotid bruits LYMPHATICS: No lymphadenopathy CARDIAC: RRR, no murmurs, rubs, gallops RESPIRATORY:  Clear to auscultation without rales, wheezing or rhonchi  ABDOMEN: Soft,  non-tender, non-distended MUSCULOSKELETAL:  No edema; No deformity  SKIN: Warm and dry NEUROLOGIC:  Alert and oriented x 3 PSYCHIATRIC:  Normal affect    Signed, Redell Leiter, MD  06/25/2024 8:55 AM    [facility]

## 2024-06-25 ENCOUNTER — Encounter: Payer: Self-pay | Admitting: Cardiology

## 2024-06-25 ENCOUNTER — Ambulatory Visit: Attending: Cardiology | Admitting: Cardiology

## 2024-06-25 VITALS — BP 138/82 | HR 76 | Ht 67.0 in | Wt 297.8 lb

## 2024-06-25 DIAGNOSIS — Z952 Presence of prosthetic heart valve: Secondary | ICD-10-CM | POA: Diagnosis not present

## 2024-06-25 DIAGNOSIS — I5032 Chronic diastolic (congestive) heart failure: Secondary | ICD-10-CM

## 2024-06-25 DIAGNOSIS — I482 Chronic atrial fibrillation, unspecified: Secondary | ICD-10-CM

## 2024-06-25 DIAGNOSIS — I110 Hypertensive heart disease with heart failure: Secondary | ICD-10-CM | POA: Diagnosis not present

## 2024-06-25 DIAGNOSIS — Z7901 Long term (current) use of anticoagulants: Secondary | ICD-10-CM

## 2024-06-25 DIAGNOSIS — N1832 Chronic kidney disease, stage 3b: Secondary | ICD-10-CM | POA: Diagnosis not present

## 2024-06-25 MED ORDER — APIXABAN 2.5 MG PO TABS: 2.5000 mg | ORAL_TABLET | Freq: Two times a day (BID) | ORAL | 3 refills | Status: AC

## 2024-06-25 NOTE — Patient Instructions (Signed)
 Medication Instructions:  Your physician has recommended you make the following change in your medication:   START: Eliquis  2.5 mg two times daily  *If you need a refill on your cardiac medications before your next appointment, please call your pharmacy*  Lab Work: None If you have labs (blood work) drawn today and your tests are completely normal, you will receive your results only by: MyChart Message (if you have MyChart) OR A paper copy in the mail If you have any lab test that is abnormal or we need to change your treatment, we will call you to review the results.  Testing/Procedures: Your physician has requested that you have an echocardiogram. Echocardiography is a painless test that uses sound waves to create images of your heart. It provides your doctor with information about the size and shape of your heart and how well your heart's chambers and valves are working. This procedure takes approximately one hour. There are no restrictions for this procedure. Please do NOT wear cologne, perfume, aftershave, or lotions (deodorant is allowed). Please arrive 15 minutes prior to your appointment time.  Please note: We ask at that you not bring children with you during ultrasound (echo/ vascular) testing. Due to room size and safety concerns, children are not allowed in the ultrasound rooms during exams. Our front office staff cannot provide observation of children in our lobby area while testing is being conducted. An adult accompanying a patient to their appointment will only be allowed in the ultrasound room at the discretion of the ultrasound technician under special circumstances. We apologize for any inconvenience.   Follow-Up: At [facility], you and your health needs are our priority.  As part of our continuing mission to provide you with exceptional heart care, our providers are all part of one team.  This team includes your primary Cardiologist (physician) and Advanced  Practice Providers or APPs (Physician Assistants and Nurse Practitioners) who all work together to provide you with the care you need, when you need it.  Your next appointment:   9 month(s)  Provider:   Redell Leiter, MD    We recommend signing up for the patient portal called MyChart.  Sign up information is provided on this After Visit Summary.  MyChart is used to connect with patients for Virtual Visits (Telemedicine).  Patients are able to view lab/test results, encounter notes, upcoming appointments, etc.  Non-urgent messages can be sent to your provider as well.   To learn more about what you can do with MyChart, go to [email or web address].   Other Instructions None

## 2024-08-23 ENCOUNTER — Other Ambulatory Visit: Payer: Self-pay | Admitting: Cardiology

## 2024-10-30 ENCOUNTER — Ambulatory Visit
